# Patient Record
Sex: Male | Born: 1950
Health system: Southern US, Community
[De-identification: ages and names within clinical notes are randomized; demographics above are authoritative.]

## PROBLEM LIST (undated history)

## (undated) DIAGNOSIS — Z0282 Encounter for adoption services: Secondary | ICD-10-CM

## (undated) DIAGNOSIS — K635 Polyp of colon: Secondary | ICD-10-CM

## (undated) DIAGNOSIS — Z789 Other specified health status: Secondary | ICD-10-CM

## (undated) DIAGNOSIS — K625 Hemorrhage of anus and rectum: Secondary | ICD-10-CM

## (undated) DIAGNOSIS — C801 Malignant (primary) neoplasm, unspecified: Secondary | ICD-10-CM

## (undated) DIAGNOSIS — K859 Acute pancreatitis without necrosis or infection, unspecified: Secondary | ICD-10-CM

## (undated) HISTORY — DX: Polyp of colon: K63.5

## (undated) HISTORY — PX: CHOLECYSTECTOMY: SHX55

## (undated) HISTORY — DX: Hemorrhage of anus and rectum: K62.5

## (undated) HISTORY — PX: TONSILLECTOMY: SUR1361

## (undated) HISTORY — PX: POLYPECTOMY: SHX149

## (undated) HISTORY — DX: Acute pancreatitis without necrosis or infection, unspecified: K85.90

---

## 2014-02-24 ENCOUNTER — Emergency Department: Payer: Self-pay | Admitting: Internal Medicine

## 2014-02-24 DIAGNOSIS — K625 Hemorrhage of anus and rectum: Secondary | ICD-10-CM

## 2014-02-24 HISTORY — DX: Hemorrhage of anus and rectum: K62.5

## 2014-02-24 LAB — URINALYSIS, COMPLETE
Bilirubin,UR: NEGATIVE
Blood: NEGATIVE
Glucose,UR: NEGATIVE mg/dL (ref 0–75)
Ketone: NEGATIVE
Nitrite: NEGATIVE
Ph: 5 (ref 4.5–8.0)
Protein: NEGATIVE
Specific Gravity: 1.018 (ref 1.003–1.030)
Squamous Epithelial: NONE SEEN

## 2014-02-24 LAB — COMPREHENSIVE METABOLIC PANEL
AST: 30 U/L (ref 15–37)
Albumin: 3.7 g/dL (ref 3.4–5.0)
Alkaline Phosphatase: 98 U/L
Anion Gap: 7 (ref 7–16)
BUN: 21 mg/dL — ABNORMAL HIGH (ref 7–18)
Bilirubin,Total: 0.5 mg/dL (ref 0.2–1.0)
CALCIUM: 8.6 mg/dL (ref 8.5–10.1)
Chloride: 108 mmol/L — ABNORMAL HIGH (ref 98–107)
Co2: 25 mmol/L (ref 21–32)
Creatinine: 0.89 mg/dL (ref 0.60–1.30)
EGFR (African American): >60
EGFR (Non-African Amer.): >60
Glucose: 106 mg/dL — ABNORMAL HIGH (ref 65–99)
OSMOLALITY: 283 (ref 275–301)
Potassium: 3.9 mmol/L (ref 3.5–5.1)
SGPT (ALT): 39 U/L (ref 12–78)
Sodium: 140 mmol/L (ref 136–145)
TOTAL PROTEIN: 7.2 g/dL (ref 6.4–8.2)

## 2014-02-24 LAB — PROTIME-INR
INR: 1
Prothrombin Time: 12.9 secs (ref 11.5–14.7)

## 2014-02-24 LAB — CBC
HCT: 46.3 % (ref 40.0–52.0)
HGB: 15.9 g/dL (ref 13.0–18.0)
MCH: 31.5 pg (ref 26.0–34.0)
MCHC: 34.4 g/dL (ref 32.0–36.0)
MCV: 92 fL (ref 80–100)
PLATELETS: 172 10*3/uL (ref 150–440)
RBC: 5.05 10*6/uL (ref 4.40–5.90)
RDW: 14.1 % (ref 11.5–14.5)
WBC: 8.8 10*3/uL (ref 3.8–10.6)

## 2014-02-24 LAB — TROPONIN I: Troponin-I: <0.02 ng/mL

## 2014-02-28 ENCOUNTER — Ambulatory Visit: Payer: Self-pay | Admitting: Gastroenterology

## 2014-02-28 HISTORY — PX: COLONOSCOPY: SHX174

## 2014-02-28 LAB — CBC WITH DIFFERENTIAL/PLATELET
Basophil #: 0.1 10*3/uL (ref 0.0–0.1)
Basophil %: 0.4 %
Eosinophil #: 0.2 10*3/uL (ref 0.0–0.7)
Eosinophil %: 1.4 %
HCT: 33.4 % — ABNORMAL LOW (ref 40.0–52.0)
HGB: 11.3 g/dL — ABNORMAL LOW (ref 13.0–18.0)
LYMPHS PCT: 13 %
Lymphocyte #: 1.7 10*3/uL (ref 1.0–3.6)
MCH: 30.9 pg (ref 26.0–34.0)
MCHC: 33.8 g/dL (ref 32.0–36.0)
MCV: 92 fL (ref 80–100)
MONOS PCT: 6.4 %
Monocyte #: 0.8 x10 3/mm (ref 0.2–1.0)
NEUTROS ABS: 10.4 10*3/uL — AB (ref 1.4–6.5)
NEUTROS PCT: 78.8 %
Platelet: 227 10*3/uL (ref 150–440)
RBC: 3.66 10*6/uL — ABNORMAL LOW (ref 4.40–5.90)
RDW: 14.4 % (ref 11.5–14.5)
WBC: 13.1 10*3/uL — ABNORMAL HIGH (ref 3.8–10.6)

## 2014-03-01 LAB — CBC WITH DIFFERENTIAL/PLATELET
BASOS ABS: 0.1 10*3/uL (ref 0.0–0.1)
Basophil %: 0.7 %
EOS PCT: 1.6 %
Eosinophil #: 0.2 10*3/uL (ref 0.0–0.7)
HCT: 27.9 % — ABNORMAL LOW (ref 40.0–52.0)
HGB: 9.6 g/dL — ABNORMAL LOW (ref 13.0–18.0)
Lymphocyte #: 1.9 10*3/uL (ref 1.0–3.6)
Lymphocyte %: 18.1 %
MCH: 31.1 pg (ref 26.0–34.0)
MCHC: 34.3 g/dL (ref 32.0–36.0)
MCV: 91 fL (ref 80–100)
Monocyte #: 0.6 x10 3/mm (ref 0.2–1.0)
Monocyte %: 5.6 %
NEUTROS PCT: 74 %
Neutrophil #: 7.6 10*3/uL — ABNORMAL HIGH (ref 1.4–6.5)
PLATELETS: 184 10*3/uL (ref 150–440)
RBC: 3.09 10*6/uL — AB (ref 4.40–5.90)
RDW: 14.4 % (ref 11.5–14.5)
WBC: 10.3 10*3/uL (ref 3.8–10.6)

## 2014-03-01 LAB — PATHOLOGY REPORT

## 2014-03-01 LAB — HEMOGLOBIN: HGB: 9.3 g/dL — AB (ref 13.0–18.0)

## 2014-03-02 LAB — PATHOLOGY REPORT

## 2014-03-28 ENCOUNTER — Telehealth: Payer: Self-pay | Admitting: *Deleted

## 2014-03-28 NOTE — Telephone Encounter (Signed)
In preparation for sigmoid exam (rigid) on 04-02-14, he will need to have a fleets enema 2 hours prior to his office visit at 8:30.

## 2014-03-29 ENCOUNTER — Telehealth: Payer: Self-pay | Admitting: *Deleted

## 2014-03-29 NOTE — Telephone Encounter (Signed)
Pt aware to do fleet enema 2 hours prior to ov at 8:30 on 04/02/14

## 2014-04-02 ENCOUNTER — Encounter: Payer: Self-pay | Admitting: General Surgery

## 2014-04-02 ENCOUNTER — Ambulatory Visit (INDEPENDENT_AMBULATORY_CARE_PROVIDER_SITE_OTHER): Payer: 59 | Admitting: General Surgery

## 2014-04-02 VITALS — BP 140/72 | HR 82 | Resp 14 | Ht 72.0 in | Wt 303.0 lb

## 2014-04-02 DIAGNOSIS — K625 Hemorrhage of anus and rectum: Secondary | ICD-10-CM

## 2014-04-02 DIAGNOSIS — K620 Anal polyp: Secondary | ICD-10-CM

## 2014-04-02 DIAGNOSIS — K621 Rectal polyp: Secondary | ICD-10-CM

## 2014-04-02 LAB — HEMOCCULT GUIAC POC 1CARD (OFFICE): FECAL OCCULT BLD: NEGATIVE

## 2014-04-02 NOTE — Patient Instructions (Signed)
Patient to be referred to [facility]. The patient is aware to call back for any questions or concerns.

## 2014-04-02 NOTE — Progress Notes (Signed)
Patient ID: Eric [name], male   DOB: 28-Mar-1951, 63 y.o.   MRN: [id number]  Chief Complaint  Patient presents with  . Other    Rigid sigmoidoscopy    HPI Eric [name] is a 63 y.o. male who presents for rectal bleeding and rigid sigmoidoscopy procedure. The bleeding was noticed in April 26,2015. He states he is not having any current bleeding. He had a colonoscopy done on 02/28/14. No other complaints at this time. The patient has a history of hemorrhoids. Regular bowel movements. He has had some rectal bleeding prior to this episode. The bleeding has been dark brown in the past.  The patient is not one to seek healthcare without strong reason. His rectal bleeding caused presentation to the ED and subsequent referral to the GI department.  The patient had been seen here in the distant past at which time he had undergone a cholecystectomy.  The patient underwent a colonoscopy on February 28, 2014. At that time a polyp was identified in the rectum. Attempted snare cautery was unsuccessful and the snare subsequently became lodged on the polyp. He had significant bleeding and was taken to the operating room the same day by Marlyce Huge, M.D. At which time the snares were removed. Subsequent pathology showed high-grade dysplasia. The patient is seen today to consider options for management. He been asked to prepare himself with the use of a fleets enema prior to the procedure so the area of the polyp could be directly assessed.   HPI  Past Medical History  Diagnosis Date  . Rectal bleeding 02/24/14  . Colon polyps   . Pancreatitis     Past Surgical History  Procedure Laterality Date  . Colonoscopy  02/28/14    Family History  Problem Relation Age of Onset  . Adopted: Yes    Social History History  Substance Use Topics  . Smoking status: Current Every Day Smoker -- 1.00 packs/day for 15 years    Types: Cigars  . Smokeless tobacco: Not on file  . Alcohol Use: Yes   Comment: 6-7 drinks weekly    No Known Allergies  No current outpatient prescriptions on file.   No current facility-administered medications for this visit.    Review of Systems Review of Systems  Constitutional: Negative.   Respiratory: Negative.   Cardiovascular: Negative.   Gastrointestinal: Positive for anal bleeding.    Blood pressure 140/72, pulse 82, resp. rate 14, height 6' (1.829 m), weight 303 lb (137.44 kg).  Physical Exam Physical Exam  Constitutional: He is oriented to person, place, and time. He appears well-developed and well-nourished.  Cardiovascular: Normal rate, regular rhythm and normal heart sounds.   No murmur heard. Pulmonary/Chest: Effort normal and breath sounds normal.  Abdominal: Soft. Normal appearance and bowel sounds are normal. There is no hepatosplenomegaly. There is no tenderness. No hernia.  Genitourinary:  Anterior skin tag.  Neurological: He is alert and oriented to person, place, and time.  Skin: Skin is warm and dry.    Data Reviewed Pathology from the February 28, 2014 colonoscopy was reviewed. Distal sigmoid polyp showed a hyperplastic polyp. The rectum showed a tubulovillous adenoma with high-grade dysplasia in multiple fragments in aggregate measuring 0.4 x 2.6 x 3.1 cm, the largest fragment measuring 1 cm in diameter. At the time of snare removal on additional 1.5 x 3.0 x 4.6 cm fragments of polypoid tissue were removed showing high-grade dysplasia.  CT scan dated February 24, 2014 showed circumferential wall thickening involving the  rectum without perirectal stranding. Extensive colonic diverticulosis. Suspected hepatic steatosis. 1.3 cm right dome of the liver lesion Dr. Are present no meningeal or complex cyst.  Laboratory studies dated February 24, 2014 showed a hemoglobin of 15.9 with an MCV of 92 and platelet count 172,000. CBC dated Mar 01, 2014 showed a hemoglobin of 9.6. The lobe was hemoglobin reported was on Mar 01, 2014 at 9.3. ECG  showed normal sinus rhythm, nonspecific T-wave abnormality. No previous ECGs available.  Sigmoidoscopy showed a firm nodular mass on the anterior rectal wall proximally 7 cm. No bleeding was noted. Stool is Hemoccult negative. The remaining rectum to 12 cm was unremarkable. No circumferential lesion was appreciated.  Assessment    Tubulovillous adenoma the anterior rectum with high-grade dysplasia.     Plan    The base of the polyp appears to remain, and intact excision to assess for the possibility of high-grade dysplasia or malignancy would be most appropriate. Considering the one a half centimeter diameter base and the firmness evident, I think he would be best served by a microsurgical technique rather than trying to bring this area down with a less than complete excision under direct vision alone. This would be best completed at a university setting by a trained colorectal specialist.  The patient's wife was a little disappointed that this could not be taken care of directly encountered, but are not were then able to surgical colleagues are doing microsurgical excision. Options for referral to [facility] or [facility] were discussed.   We'll make arrangements for university assessment the next available date.    PCP: No Pcp Per Patient Ref. MD: Tonia Ghent Melecio Cueto 04/02/2014, 8:55 PM

## 2014-05-14 ENCOUNTER — Telehealth: Payer: Self-pay | Admitting: *Deleted

## 2014-05-14 NOTE — Telephone Encounter (Signed)
Message copied by Carson Myrtle on Tue May 14, 2014  9:25 AM ------      Message from: Robert Bellow      Created: Fri May 10, 2014  2:41 PM       See how evaluation with Dr. Audie Clear at [facility] is going. Thanks.  ------

## 2014-05-14 NOTE — Telephone Encounter (Signed)
I talked with his wife and she said they removed the polyp yesterday 05-13-14, the surgery itself went well, however, he had trouble voiding postop. In/out cath x 1 and he is being discharged today. Appreciates phone call.

## 2014-08-05 LAB — PSA: PSA: 0.7

## 2015-02-22 NOTE — Op Note (Signed)
PATIENT NAME:  Eric [name], Eric [name] MR#:  [id number] DATE OF BIRTH:  05-05-1951  DATE OF PROCEDURE:  02/28/2014  ATTENDING PHYSICIAN: Harrell Gave A. Ijanae Macapagal, MD  PREOPERATIVE DIAGNOSES:  1. Rectal polyp. 2. Retained snares from rectal polyp.   POSTOPERATIVE DIAGNOSES:  1. Rectal polyp. 2. Retained snares from rectal polyp.   PROCEDURES PERFORMED:  1. Rectal exam under anesthesia.  2. Removal of rectal foreign body x2. 3. Biopsy of rectal polyp.   ANESTHESIA: General.   ESTIMATED BLOOD LOSS: 50 mL.   COMPLICATIONS: None.   SPECIMEN: Foreign bodies x2 for gross and followup biopsy.   INDICATIONS FOR PROCEDURE: [name] is a pleasant 64 year old male who presents with rectal bleeding, who underwent a colonoscopy. He was noted to have a large friable polyp approximately 7 cm from his anus. He had attempts to snare it, resulting in inability to remove polyp and retention of snare. I was thus consulted for removal of snare.   DETAILS OF PROCEDURE: As follows: Informed consent was obtained. [name] was brought to the operating room suite. He was induced, endotracheal tube was placed, general anesthesia was administered. He was placed in stirrups. His anus was prepped and draped in standard surgical fashion. A timeout was then performed, correctly identifying the patient name, operative site and procedure to be performed. The wires were evaluated. They were slightly tensioned. Tension was put on it, and they were fixed. I then placed a rigid sigmoidoscope, and unfortunately, I could not obtain insufflation to the point of the polyp, which could be easily palpated. I then obtained an anoscope, which the polyp was a little too proximal. It felt to be approximately 7 cm. I then examined the loops of the snare and noticed that they were loops and cut one strand and then pulled both snares off. I then went to examine the polyp to make sure it was hemostatic. Attempts to grab it with Allis clamp ended up  removing pieces of the friable polyp. After cautery was used to obtain hemostasis, Gelfoam was then placed into the anus at the area of the polyp for hemostasis. There appeared to be no sufficient bleeding and no retained blood in the rectum. The patient's drapes were then taken down. He was then awoken, extubated and brought to the postanesthesia care unit. There were no immediate complications. Needle, sponge and instrument counts were correct at the end of the procedure.   ____________________________ Glena Norfolk. Ayanah Snader, MD cal:lb D: 03/01/2014 10:29:33 ET T: 03/01/2014 10:57:45 ET JOB#: [id number]  cc: Harrell Gave A. Aleksis Jiggetts, MD, <Dictator> Floyde Parkins MD ELECTRONICALLY SIGNED 03/02/2014 8:36

## 2015-02-22 NOTE — H&P (Signed)
   Subjective/Chief Complaint Rectal polyp, retained snare wire x 2 following attempted polypectomy   History of Present Illness Eric [name] is a pleasant   Past History Pancreatitis H/o excision of external hemorrhoid   Past Med/Surgical Hx:  Pancreatitis:   External Hemorrhoid removal:   ALLERGIES:  No Known Allergies:   Family and Social History:  Family History Non-Contributory   Social History negative tobacco, negative ETOH   Place of Living Home   Review of Systems:  Subjective/Chief Complaint Rectal foreign body - snare x 2   Fever/Chills No   Cough No   Sputum No   Abdominal Pain No   Diarrhea No   Constipation No   Nausea/Vomiting No   SOB/DOE No   Chest Pain No   Dysuria No   Tolerating PT No   Tolerating Diet Yes   Physical Exam:  GEN well developed, no acute distress   HEENT pink conjunctivae, PERRL   NECK No masses   RESP normal resp effort  clear BS  no use of accessory muscles   CARD regular rate  no murmur  no thrills  no carotid bruits  No LE edema  no JVD   ABD denies tenderness  denies Flank Tenderness  no hernia  soft  normal BS  Rectal exam deferred   EXTR negative cyanosis/clubbing, negative edema   SKIN normal to palpation, No rashes, No ulcers   NEURO cranial nerves intact, negative rigidity, negative tremor   PSYCH A+O to time, place, person, good insight    Assessment/Admission Diagnosis Eric [name] is a pleasant 64 yo M with retained snares following attempted excision of rectal mass.  Per Dr. Candace Cruise, rectal mass was oozing at time of scope removal   Plan To OR for EUA, probable rigid sigmoidoscopy and removal of snare and hemostasis   Electronic Signatures: Judit Awad, Glena Norfolk (MD)  (Signed 30-Apr-15 10:53)  Authored: CHIEF COMPLAINT and HISTORY, PAST MEDICAL/SURGIAL HISTORY, ALLERGIES, FAMILY AND SOCIAL HISTORY, REVIEW OF SYSTEMS, PHYSICAL EXAM, ASSESSMENT AND PLAN   Last Updated: 30-Apr-15 10:53 by Floyde Parkins (MD)

## 2015-02-22 NOTE — Consult Note (Signed)
Signif drop in hgb overnight. Fortunately, no bleeding since 11 PM last night. No abd pain. Minimal change in hgb since this AM. Stable for discharge. Will contact patient once bx results are back from rectum. Thanks for surgery help.   Electronic Signatures: Verdie Shire (MD)  (Signed on 01-May-15 20:59)  Authored  Last Updated: 01-May-15 20:59 by Verdie Shire (MD)

## 2016-03-25 DIAGNOSIS — Z86010 Personal history of colonic polyps: Secondary | ICD-10-CM | POA: Insufficient documentation

## 2016-03-25 DIAGNOSIS — K5790 Diverticulosis of intestine, part unspecified, without perforation or abscess without bleeding: Secondary | ICD-10-CM | POA: Insufficient documentation

## 2016-03-25 DIAGNOSIS — F17200 Nicotine dependence, unspecified, uncomplicated: Secondary | ICD-10-CM | POA: Insufficient documentation

## 2018-02-16 ENCOUNTER — Encounter: Payer: Self-pay | Admitting: Family Medicine

## 2018-02-16 ENCOUNTER — Ambulatory Visit: Payer: BLUE CROSS/BLUE SHIELD | Admitting: Family Medicine

## 2018-02-16 VITALS — BP 120/70 | HR 84 | Temp 99.8°F | Resp 16 | Wt 300.0 lb

## 2018-02-16 DIAGNOSIS — J301 Allergic rhinitis due to pollen: Secondary | ICD-10-CM

## 2018-02-16 DIAGNOSIS — Z86010 Personal history of colonic polyps: Secondary | ICD-10-CM

## 2018-02-16 DIAGNOSIS — K5790 Diverticulosis of intestine, part unspecified, without perforation or abscess without bleeding: Secondary | ICD-10-CM | POA: Diagnosis not present

## 2018-02-16 DIAGNOSIS — J019 Acute sinusitis, unspecified: Secondary | ICD-10-CM | POA: Diagnosis not present

## 2018-02-16 MED ORDER — AMOXICILLIN 500 MG PO CAPS: 1000.0000 mg | ORAL_CAPSULE | Freq: Two times a day (BID) | ORAL | 0 refills | Status: AC

## 2018-02-16 NOTE — Progress Notes (Signed)
Patient: Eric [name] Male    DOB: [birth year]   67 y.o.   MRN: [id number] Visit Date: 02/16/2018  Today's Provider: Lelon Huh, MD   Chief Complaint  Patient presents with  . Cough   Subjective:    Patient has had sinus congestion and cough for 2 1/2 weeks. Patient also has symptoms of wheezing, ear congestion, sore throat, shortness of breath, and eye irritation. Patient's eyes are red, swollen and have discharge. Patient has been taking otc dayquil and mucinex with mild relief.   Cough  This is a new problem. The current episode started 1 to 4 weeks ago (2 1/2 weeks). The problem has been gradually worsening. The cough is productive of sputum. Associated symptoms include ear congestion, a fever, nasal congestion, rhinorrhea, a sore throat, shortness of breath and wheezing. Pertinent negatives include no chest pain, chills, ear pain, headaches, heartburn, hemoptysis, myalgias, postnasal drip, rash, sweats or weight loss. The symptoms are aggravated by lying down. Treatments tried: dayquil and muninex. The treatment provided mild relief. There is no history of asthma, bronchiectasis, bronchitis, COPD, emphysema, environmental allergies or pneumonia.     Patient presents to re-establish care. Was last seen in this office in October 2015. Reviewed complete medical family and social history today. He is now retired from [employer] for about 2 years.   No Known Allergies   Current Outpatient Medications:  Marland Kitchen  MULTIPLE VITAMIN PO, Take by mouth., Disp: , Rfl:   Past Medical History:  Diagnosis Date  . Colon polyps   . Pancreatitis   . Rectal bleeding 02/24/14   Past Surgical History:  Procedure Laterality Date  . CHOLECYSTECTOMY    . COLONOSCOPY  02/28/14  . POLYPECTOMY     colon polyp removed  . TONSILLECTOMY     Family History  Adopted: Yes     Review of Systems  Constitutional: Positive for fever. Negative for chills and weight loss.  HENT: Positive for  congestion, rhinorrhea, sinus pressure and sore throat. Negative for ear pain and postnasal drip.   Respiratory: Positive for cough, shortness of breath and wheezing. Negative for hemoptysis.   Cardiovascular: Negative for chest pain.  Gastrointestinal: Negative for heartburn.  Musculoskeletal: Negative for myalgias.  Skin: Negative for rash.  Allergic/Immunologic: Negative for environmental allergies.  Neurological: Negative for headaches.    Social History   Tobacco Use  . Smoking status: Current Every Day Smoker    Packs/day: 1.00    Years: 15.00    Pack years: 15.00    Types: Cigars  . Smokeless tobacco: Never Used  Substance Use Topics  . Alcohol use: Yes    Comment: 6-7 drinks weekly   Objective:   BP 120/70 (BP Location: Right Arm, Patient Position: Sitting, Cuff Size: Large)   Pulse 84   Temp 99.8 F (37.7 C) (Oral)   Resp 16   Wt 300 lb (136.1 kg)   SpO2 95%   BMI 41.84 kg/m  Vitals:   02/16/18 1118  BP: 120/70  Pulse: 84  Resp: 16  Temp: 99.8 F (37.7 C)  TempSrc: Oral  SpO2: 95%  Weight: 300 lb (136.1 kg)     Physical Exam  General Appearance:    Alert, cooperative, no distress  HENT:   bilateral TM normal without fluid or infection, neck has bilateral anterior cervical nodes enlarged, frontal sinus tender and nasal mucosa pale and congested  Eyes:    PERRL, conjunctiva/corneas clear, EOM's intact  Lungs:     Clear to auscultation bilaterally, respirations unlabored  Heart:    Regular rate and rhythm  Neurologic:   Awake, alert, oriented x 3. No apparent focal neurological           defect.           Assessment & Plan:      Re-establish patient to practice.   1. Subacute sinusitis, unspecified location  - amoxicillin (AMOXIL) 500 MG capsule; Take 2 capsules (1,000 mg total) by mouth 2 (two) times daily for 7 days.  Dispense: 28 capsule; Refill: 0  2. Allergic rhinitis due to pollen, unspecified seasonality Start OTC non-sedating  antihistamine.         Lelon Huh, MD  [facility]

## 2018-02-16 NOTE — Patient Instructions (Signed)
   Start taking a daily non-sedating antihistamine such as fexofenadine (Allegra) or cetirizine (Zyrtec)

## 2018-02-17 ENCOUNTER — Ambulatory Visit: Payer: Self-pay | Admitting: Family Medicine

## 2018-06-05 ENCOUNTER — Encounter: Payer: Self-pay | Admitting: Family Medicine

## 2019-06-18 ENCOUNTER — Encounter: Payer: Self-pay | Admitting: Physician Assistant

## 2019-06-18 ENCOUNTER — Other Ambulatory Visit: Payer: Self-pay

## 2019-06-18 ENCOUNTER — Ambulatory Visit (INDEPENDENT_AMBULATORY_CARE_PROVIDER_SITE_OTHER): Payer: Self-pay | Admitting: Physician Assistant

## 2019-06-18 DIAGNOSIS — J0510 Acute epiglottitis without obstruction: Secondary | ICD-10-CM

## 2019-06-18 MED ORDER — AZITHROMYCIN 250 MG PO TABS: ORAL_TABLET | ORAL | 0 refills | Status: DC

## 2019-06-18 MED ORDER — PREDNISONE 10 MG (21) PO TBPK: ORAL_TABLET | ORAL | 0 refills | Status: DC

## 2019-06-18 MED ORDER — GUAIFENESIN 100 MG/5ML PO LIQD: 200.0000 mg | Freq: Three times a day (TID) | ORAL | 0 refills | Status: DC | PRN

## 2019-06-18 NOTE — Progress Notes (Signed)
Patient: Eric [name] Male    DOB: [birth year]   68 y.o.   MRN: [id number] Visit Date: 06/18/2019  Today's Provider: Mar Daring, PA-C   No chief complaint on file.  Subjective:     Virtual Visit via Video Note  I connected with Gorden Harms on 06/18/19 at  3:40 PM EDT by a video enabled telemedicine application and verified that I am speaking with the correct person using two identifiers.  Location: Patient: Home Provider: [facility]   I discussed the limitations of evaluation and management by telemedicine and the availability of in person appointments. The patient expressed understanding and agreed to proceed.  HPI:  Eric [name] is a 68 yr old male that presents via video visit for concerns of hoarseness, sensation of something in his throat, increased mucus production, and chest congestion/cough. He reports that 2-3 weeks ago he was taking his multivitamins and had one get stuck in his throat. He was choking and could not get it to go down, but eventually got it to come back up. Since then he has been hoarse talking, had some swelling in his neck (glands vs lymph nodes) with the left side worse than the right. Also just 3-4 days ago he noticed increased mucous production in his throat and increased chest congestion. He started taking Mucinex. He reports that since starting Mucinex the chest congestion and cough have all improved but he still has this fullness sensation in his throat and has the hoarse voice.   No Known Allergies  Current Outpatient Medications:  Marland Kitchen  MULTIPLE VITAMIN PO, Take by mouth., Disp: , Rfl:   Review of Systems  Constitutional: Negative.  Negative for chills and fever.  HENT: Positive for congestion, sore throat, trouble swallowing and voice change. Negative for ear pain, postnasal drip, rhinorrhea, sinus pressure and sinus pain.   Respiratory: Positive for cough and shortness of breath (did have a few nights ago while sleeping). Negative for  chest tightness and wheezing.   Cardiovascular: Negative for chest pain, palpitations and leg swelling.  Gastrointestinal: Negative for abdominal pain and nausea.  Neurological: Negative for dizziness and headaches.    Social History   Tobacco Use  . Smoking status: Current Every Day Smoker    Packs/day: 1.00    Years: 20.00    Pack years: 20.00    Types: Cigars  . Smokeless tobacco: Never Used  . Tobacco comment: smoke 1/2 ppd cigaretes since 68 yo. changed to cigars at age July 2004.   Substance Use Topics  . Alcohol use: Yes    Comment: 6-7 drinks weekly      Objective:   There were no vitals taken for this visit. There were no vitals filed for this visit.   Physical Exam Vitals signs reviewed.  Constitutional:      General: He is not in acute distress.    Appearance: Normal appearance. He is well-developed. He is not ill-appearing.  HENT:     Head: Normocephalic and atraumatic.  Eyes:     Conjunctiva/sclera: Conjunctivae normal.  Neck:     Musculoskeletal: Normal range of motion and neck supple.     Comments: Voice sounds almost like "hot potato" voice Pulmonary:     Effort: Pulmonary effort is normal. No respiratory distress.  Neurological:     Mental Status: He is alert.  Psychiatric:        Behavior: Behavior normal.        Thought Content: Thought  content normal.        Judgment: Judgment normal.      No results found for any visits on 06/18/19.     Assessment & Plan     1. Epiglottitis Possible epiglottitis or other swelling in the laryngopharynx region from the multivitamin getting stuck possibly causing a tear or injury that may have set up for secondary infection. Will treat with zpak, prednisone as below. Guaifenesin prescribed for mucus production as below since this is helping currently. Advised to call if symptoms change or worsen in the next coming days as he may require ENT referral to evaluate his throat better. He agrees.  - azithromycin  (ZITHROMAX) 250 MG tablet; Take 2 tablets PO on day one, and one tablet PO daily thereafter until completed.  Dispense: 6 tablet; Refill: 0 - predniSONE (STERAPRED UNI-PAK 21 TAB) 10 MG (21) TBPK tablet; 6 day taper; take as directed on package instructions  Dispense: 21 tablet; Refill: 0 - guaiFENesin (ROBITUSSIN) 100 MG/5ML liquid; Take 10 mLs (200 mg total) by mouth 3 (three) times daily as needed for cough.  Dispense: 120 mL; Refill: 0   I discussed the assessment and treatment plan with the patient. The patient was provided an opportunity to ask questions and all were answered. The patient agreed with the plan and demonstrated an understanding of the instructions.   The patient was advised to call back or seek an in-person evaluation if the symptoms worsen or if the condition fails to improve as anticipated.  I provided 14 minutes of non-face-to-face time during this encounter.    Mar Daring, PA-C  [facility]

## 2019-06-25 ENCOUNTER — Telehealth: Payer: Self-pay | Admitting: General Practice

## 2019-06-25 DIAGNOSIS — J0510 Acute epiglottitis without obstruction: Secondary | ICD-10-CM

## 2019-06-25 MED ORDER — PREDNISONE 10 MG (21) PO TBPK: ORAL_TABLET | ORAL | 0 refills | Status: DC

## 2019-06-25 MED ORDER — AZITHROMYCIN 250 MG PO TABS: ORAL_TABLET | ORAL | 0 refills | Status: DC

## 2019-06-25 NOTE — Telephone Encounter (Signed)
Patient was advised.  

## 2019-06-25 NOTE — Telephone Encounter (Signed)
Did zpak help any? Does he want to try a second round of zpak?

## 2019-06-25 NOTE — Telephone Encounter (Signed)
Please advise 

## 2019-06-25 NOTE — Telephone Encounter (Signed)
FYI! Left detailed message notifying pharmacy it is ok to get second zpak.

## 2019-06-25 NOTE — Telephone Encounter (Signed)
[facility] 

## 2019-06-25 NOTE — Telephone Encounter (Signed)
[facility] called asking if it is ok that patient is getting two rounds of zpak back to back  CB#  [phone]  [place]

## 2019-06-25 NOTE — Telephone Encounter (Signed)
Pt calling back to let Tawanna Sat know the prednisone helped but still having the cold symptoms.  Pt wants to know what to do next.  Please advise.  Thanks, [employer]

## 2019-06-25 NOTE — Telephone Encounter (Signed)
Patient stated the combination of zpak and prednisone seemed to help somewhat but throat is still sore. Patient states if you think another round of zpak will help, he will try it.

## 2019-06-27 ENCOUNTER — Other Ambulatory Visit: Payer: Self-pay | Admitting: Otolaryngology

## 2019-06-27 ENCOUNTER — Telehealth: Payer: Self-pay | Admitting: General Practice

## 2019-06-27 DIAGNOSIS — R599 Enlarged lymph nodes, unspecified: Secondary | ICD-10-CM

## 2019-06-27 DIAGNOSIS — D3702 Neoplasm of uncertain behavior of tongue: Secondary | ICD-10-CM

## 2019-06-27 DIAGNOSIS — R591 Generalized enlarged lymph nodes: Secondary | ICD-10-CM | POA: Diagnosis not present

## 2019-06-27 DIAGNOSIS — R1314 Dysphagia, pharyngoesophageal phase: Secondary | ICD-10-CM | POA: Diagnosis not present

## 2019-06-27 DIAGNOSIS — H6121 Impacted cerumen, right ear: Secondary | ICD-10-CM | POA: Diagnosis not present

## 2019-06-27 NOTE — Telephone Encounter (Signed)
Opened in error

## 2019-06-29 ENCOUNTER — Other Ambulatory Visit: Payer: Self-pay

## 2019-06-29 ENCOUNTER — Ambulatory Visit
Admission: RE | Admit: 2019-06-29 | Discharge: 2019-06-29 | Disposition: A | Discharge status: Home / Self Care | Payer: Medicare Other | Source: Ambulatory Visit | Attending: Otolaryngology | Admitting: Otolaryngology

## 2019-06-29 DIAGNOSIS — R599 Enlarged lymph nodes, unspecified: Secondary | ICD-10-CM | POA: Diagnosis not present

## 2019-06-29 DIAGNOSIS — D3702 Neoplasm of uncertain behavior of tongue: Secondary | ICD-10-CM | POA: Insufficient documentation

## 2019-06-29 DIAGNOSIS — R1310 Dysphagia, unspecified: Secondary | ICD-10-CM | POA: Diagnosis not present

## 2019-06-29 LAB — POCT I-STAT CREATININE: Creatinine, Ser: 1 mg/dL (ref 0.61–1.24)

## 2019-06-29 MED ORDER — IOHEXOL 300 MG/ML  SOLN
75.0000 mL | Freq: Once | INTRAMUSCULAR | Status: AC | PRN
  Administered 2019-06-29: 10:00:00 75 mL via INTRAVENOUS

## 2019-07-02 ENCOUNTER — Telehealth: Payer: Self-pay | Admitting: Internal Medicine

## 2019-07-02 DIAGNOSIS — R591 Generalized enlarged lymph nodes: Secondary | ICD-10-CM | POA: Diagnosis not present

## 2019-07-02 DIAGNOSIS — J019 Acute sinusitis, unspecified: Secondary | ICD-10-CM | POA: Diagnosis not present

## 2019-07-02 NOTE — Telephone Encounter (Signed)
Spoke to [place] re: new referral. Dx; Lymphoma. Dr.Vaught going to contact the pt re: referral in AM today.   Please have pt follow up with me on 9/01 at 2:15. Please contact the pt re: appt LATER in the afternoon today.

## 2019-07-03 ENCOUNTER — Other Ambulatory Visit: Payer: Self-pay

## 2019-07-03 ENCOUNTER — Encounter: Payer: Self-pay | Admitting: Internal Medicine

## 2019-07-03 ENCOUNTER — Inpatient Hospital Stay: Payer: Medicare Other

## 2019-07-03 ENCOUNTER — Other Ambulatory Visit: Payer: Self-pay | Admitting: Otolaryngology

## 2019-07-03 ENCOUNTER — Inpatient Hospital Stay: Payer: Medicare Other | Attending: Internal Medicine | Admitting: Internal Medicine

## 2019-07-03 VITALS — BP 103/67 | HR 104 | Temp 97.1°F | Resp 24

## 2019-07-03 DIAGNOSIS — D3702 Neoplasm of uncertain behavior of tongue: Secondary | ICD-10-CM

## 2019-07-03 DIAGNOSIS — C8338 Diffuse large B-cell lymphoma, lymph nodes of multiple sites: Secondary | ICD-10-CM | POA: Diagnosis not present

## 2019-07-03 DIAGNOSIS — R590 Localized enlarged lymph nodes: Secondary | ICD-10-CM | POA: Diagnosis not present

## 2019-07-03 DIAGNOSIS — Z5111 Encounter for antineoplastic chemotherapy: Secondary | ICD-10-CM | POA: Diagnosis not present

## 2019-07-03 DIAGNOSIS — R591 Generalized enlarged lymph nodes: Secondary | ICD-10-CM

## 2019-07-03 DIAGNOSIS — C8590 Non-Hodgkin lymphoma, unspecified, unspecified site: Secondary | ICD-10-CM | POA: Insufficient documentation

## 2019-07-03 DIAGNOSIS — Z5112 Encounter for antineoplastic immunotherapy: Secondary | ICD-10-CM | POA: Insufficient documentation

## 2019-07-03 DIAGNOSIS — Z5189 Encounter for other specified aftercare: Secondary | ICD-10-CM | POA: Insufficient documentation

## 2019-07-03 LAB — CBC WITH DIFFERENTIAL/PLATELET
Abs Immature Granulocytes: 0.05 10*3/uL (ref 0.00–0.07)
Basophils Absolute: 0 10*3/uL (ref 0.0–0.1)
Basophils Relative: 0 %
Eosinophils Absolute: 0 10*3/uL (ref 0.0–0.5)
Eosinophils Relative: 0 %
HCT: 28.7 % — ABNORMAL LOW (ref 39.0–52.0)
Hemoglobin: 9.1 g/dL — ABNORMAL LOW (ref 13.0–17.0)
Immature Granulocytes: 1 %
Lymphocytes Relative: 21 %
Lymphs Abs: 1.1 10*3/uL (ref 0.7–4.0)
MCH: 30.8 pg (ref 26.0–34.0)
MCHC: 31.7 g/dL (ref 30.0–36.0)
MCV: 97.3 fL (ref 80.0–100.0)
Monocytes Absolute: 0.9 10*3/uL (ref 0.1–1.0)
Monocytes Relative: 17 %
Neutro Abs: 3.2 10*3/uL (ref 1.7–7.7)
Neutrophils Relative %: 61 %
Platelets: 139 10*3/uL — ABNORMAL LOW (ref 150–400)
RBC: 2.95 MIL/uL — ABNORMAL LOW (ref 4.22–5.81)
RDW: 17.8 % — ABNORMAL HIGH (ref 11.5–15.5)
WBC: 5.3 10*3/uL (ref 4.0–10.5)
nRBC: 0 % (ref 0.0–0.2)

## 2019-07-03 LAB — COMPREHENSIVE METABOLIC PANEL
ALT: 23 U/L (ref 0–44)
AST: 25 U/L (ref 15–41)
Albumin: 3.8 g/dL (ref 3.5–5.0)
Alkaline Phosphatase: 75 U/L (ref 38–126)
Anion gap: 5 (ref 5–15)
BUN: 28 mg/dL — ABNORMAL HIGH (ref 8–23)
CO2: 30 mmol/L (ref 22–32)
Calcium: 9.6 mg/dL (ref 8.9–10.3)
Chloride: 103 mmol/L (ref 98–111)
Creatinine, Ser: 1.22 mg/dL (ref 0.61–1.24)
GFR calc Af Amer: >60 mL/min (ref >60)
GFR calc non Af Amer: >60 mL/min (ref >60)
Glucose, Bld: 104 mg/dL — ABNORMAL HIGH (ref 70–99)
Potassium: 4.3 mmol/L (ref 3.5–5.1)
Sodium: 138 mmol/L (ref 135–145)
Total Bilirubin: 2.1 mg/dL — ABNORMAL HIGH (ref 0.3–1.2)
Total Protein: 6.9 g/dL (ref 6.5–8.1)

## 2019-07-03 LAB — LACTATE DEHYDROGENASE: LDH: 280 U/L — ABNORMAL HIGH (ref 98–192)

## 2019-07-03 MED ORDER — PREDNISONE 50 MG PO TABS: ORAL_TABLET | ORAL | 0 refills | Status: DC

## 2019-07-03 NOTE — Assessment & Plan Note (Deleted)
#   #   DISPOSITION: # labs today # PET ASAP # follow up in 1 week- DR.B

## 2019-07-03 NOTE — Patient Instructions (Signed)
#  Biopsy planned tomorrow await further instructions from the radiology department  #Start prednisone as ordered; after the biopsy is done.  #If any worsening difficulty breathing-go to the emergency room.

## 2019-07-03 NOTE — Progress Notes (Signed)
[place] [facility] NOTE  Patient Care Team: [name], MD as PCP - General (Family Medicine) [name] [name], MD (General Surgery)  CHIEF COMPLAINTS/PURPOSE OF CONSULTATION: Bulky neck adenopathy/lymphoma  #  Oncology History Overview Note  #June 29, 2019-bulky bilateral neck adenopathy left more than right;    Lymphoma of lymph nodes ([place])  07/03/2019 Initial Diagnosis   Lymphoma of lymph nodes ([place])      HISTORY OF PRESENTING ILLNESS:  Eric [name] 68 y.o.  male with no significant past medical history not on any regular medications-has been referred to [place] for bulky lymphadenopathy in the neck suggestive of lymphoma.  Patient states that approximately 5 weeks ago noted to have difficulties swallowing initially; when a vitamin pill got stuck.  However this started to be more frequent.   In the last 3- 4 weeks he noticed bilateral swelling in the neck left more than right.  He also complains of difficulty breathing especially at nighttime.  He thinks he has sleep apnea; although he has never been tested.  Patient is quite anxious.  Given the concerns of malignancy.   Lost about 12 pounds in the last 4 to 6 weeks given the difficulty in eating.  Poor appetite.  No night sweats.  No fevers.   Review of Systems  Constitutional: Positive for malaise/fatigue and weight loss. Negative for chills, diaphoresis and fever.  HENT: Negative for nosebleeds and sore throat.   Eyes: Negative for double vision.  Respiratory: Positive for cough and shortness of breath. Negative for hemoptysis, sputum production and wheezing.   Cardiovascular: Negative for chest pain, palpitations, orthopnea and leg swelling.  Gastrointestinal: Negative for abdominal pain, blood in stool, constipation, diarrhea, heartburn, melena, nausea and vomiting.  Genitourinary: Negative for dysuria, frequency and urgency.  Musculoskeletal: Negative for back pain and joint pain.  Skin:  Negative.  Negative for itching and rash.  Neurological: Negative for dizziness, tingling, focal weakness, weakness and headaches.  Endo/Heme/Allergies: Does not bruise/bleed easily.  Psychiatric/Behavioral: Negative for depression. The patient is nervous/anxious. The patient does not have insomnia.      MEDICAL HISTORY:  Past Medical History:  Diagnosis Date  . Colon polyps   . Pancreatitis   . Rectal bleeding 02/24/14    SURGICAL HISTORY: Past Surgical History:  Procedure Laterality Date  . CHOLECYSTECTOMY    . COLONOSCOPY  02/28/14  . POLYPECTOMY     colon polyp removed  . TONSILLECTOMY      SOCIAL HISTORY: Social History   Socioeconomic History  . Marital status: Married    Spouse name: Not on file  . Number of children: Not on file  . Years of education: Not on file  . Highest education level: Not on file  Occupational History    Comment: Retired from [employer]  . Financial resource strain: Not on file  . Food insecurity    Worry: Not on file    Inability: Not on file  . Transportation needs    Medical: Not on file    Non-medical: Not on file  Tobacco Use  . Smoking status: Current Every Day Smoker    Packs/day: 1.00    Years: 20.00    Pack years: 20.00    Types: Cigars  . Smokeless tobacco: Never Used  . Tobacco comment: smoke 1/2 ppd cigaretes since 68 yo. changed to cigars at age July 2004.   Substance and Sexual Activity  . Alcohol use: Yes    Comment: 6-7  drinks weekly  . Drug use: No  . Sexual activity: Not on file  Lifestyle  . Physical activity    Days per week: Not on file    Minutes per session: Not on file  . Stress: Not on file  Relationships  . Social Herbalist on phone: Not on file    Gets together: Not on file    Attends religious service: Not on file    Active member of club or organization: Not on file    Attends meetings of clubs or organizations: Not on file    Relationship status: Not on file  .  Intimate partner violence    Fear of current or ex partner: Not on file    Emotionally abused: Not on file    Physically abused: Not on file    Forced sexual activity: Not on file  Other Topics Concern  . Not on file  Social History Narrative   Lives at home with wife; in [place]. retd from [employer] smoker; drinks liquor. Children - grown up.     FAMILY HISTORY: Family History  Adopted: Yes    ALLERGIES:  is allergic to azithromycin.  MEDICATIONS:  Current Outpatient Medications  Medication Sig Dispense Refill  . amoxicillin-clavulanate (AUGMENTIN) 875-125 MG tablet Take 1 tablet by mouth 2 (two) times daily.    . predniSONE (DELTASONE) 50 MG tablet Take 2 tablets once day x 5 days; START AFTER THE BIOPSY; 10 tablet 0   No current facility-administered medications for this visit.       Marland Kitchen  PHYSICAL EXAMINATION: ECOG PERFORMANCE STATUS: 1 - Symptomatic but completely ambulatory  Vitals:   07/03/19 1427  BP: 103/67  Pulse: (!) 104  Resp: (!) 24  Temp: (!) 97.1 F (36.2 C)  SpO2: 94%   There were no vitals filed for this visit.  Physical Exam  Constitutional: He is oriented to person, place, and time and well-developed, well-nourished, and in no distress.  HENT:  Head: Normocephalic and atraumatic.  Mouth/Throat: Oropharynx is clear and moist. No oropharyngeal exudate.  Eyes: Pupils are equal, round, and reactive to light.  Neck: Normal range of motion. Neck supple.  Bilateral bulky lymphadenopathy left more than right.  Bilateral axillary adenopathy 1 to 2 cm in size.  Bilateral inguinal adenopathy 1 to 2 cm in size.  Cardiovascular: Normal rate and regular rhythm.  Pulmonary/Chest: Effort normal and breath sounds normal. No respiratory distress. He has no wheezes.  Abdominal: Soft. Bowel sounds are normal. He exhibits no distension and no mass. There is no abdominal tenderness. There is no rebound and no guarding.  Musculoskeletal: Normal range of motion.         General: No tenderness or edema.  Neurological: He is alert and oriented to person, place, and time.  Skin: Skin is warm.  Psychiatric: Affect normal.     LABORATORY DATA:  I have reviewed the data as listed Lab Results  Component Value Date   WBC 10.3 03/01/2014   HGB 9.3 (L) 03/01/2014   HCT 27.9 (L) 03/01/2014   MCV 91 03/01/2014   PLT 184 03/01/2014   Recent Labs    06/29/19 1018  CREATININE 1.00    RADIOGRAPHIC STUDIES: I have personally reviewed the radiological images as listed and agreed with the findings in the report. Ct Soft Tissue Neck W Contrast  Result Date: 06/29/2019 CLINICAL DATA:  Dysphagia and intermittent hoarseness over the last month. Smoking history. EXAM: CT NECK WITH CONTRAST  TECHNIQUE: Multidetector CT imaging of the neck was performed using the standard protocol following the bolus administration of intravenous contrast. CONTRAST:  23m OMNIPAQUE IOHEXOL 300 MG/ML  SOLN COMPARISON:  None. FINDINGS: Pharynx and larynx: There is a 6 x 4 x 4 cm mass at the tongue base/vallecula, flattening the epiglottis inferiorly and encroaching markedly upon the oropharynx. This appears fairly well circumscribed and has homogeneous density. Massive bilateral neck adenopathy left worse than right described below without necrosis leads me to favor the diagnosis of lymphoma, with this large oropharyngeal mass probably representing lymphoma within the lingual tonsillar tissue, more likely than a primary squamous cell carcinoma. Salivary glands: Parotid and submandibular glands are intrinsically normal. Slight prominence intraparotid nodes. Thyroid: Normal Lymph nodes: Massive bilateral neck lymphadenopathy. Largest node on the right is a level 2 node with diameter of 2.6 cm. Massive nodal confluence at level 2 on the left measures 6.4 x 5.1 cm axial image 50. Adenopathy in the lower neck is much more bulky on the left than the right including extensive supraclavicular  lymphadenopathy, again left more than right. None of these nodes show necrosis. The pattern favors lymphoma over metastatic disease. Smaller nodes are evident in the axillary regions and superior mediastinum, probably also involved. Vascular: Normal Limited intracranial: Normal Visualized orbits: Normal Mastoids and visualized paranasal sinuses: Mucosal thickening and or cysts/polyps in the left maxillary sinus. Lesser mucosal thickening in the right maxillary sinus. Scattered opacified ethmoid air cells. Skeleton: Degenerative cervical spondylosis. Upper chest: No lung pathology. Small pathologic superior mediastinal nodes as discussed above. Other: None IMPRESSION: Massive lymphadenopathy pattern more extensive on the left than the right. 6 x 4 x 4 cm mass at the tongue base/vallecula with displacement of the epiglottis and marked encroachment upon the oropharynx. In this setting, this is felt to be lymphoid tissue involvement by the same process, probably malignant lymphoma, rather than a primary squamous cell carcinoma of the tongue base. Density of this mass and of the lymph nodes is homogeneous and there are no necrotic nodes. See above for full discussion. Lesser involvement of the nodes in the superior mediastinum axillary regions. Electronically Signed   By: MNelson ChimesM.D.   On: 06/29/2019 13:02    ASSESSMENT & PLAN:   Lymphoma of lymph nodes ([facility]) #Clinically highly suspicious of malignant lymphoma-seems aggressive based on clinical history.  Spoke to interventional radiology-kindly agrees to move the core biopsy tomorrow 9/2. -   #Had a long discussion the patient regarding the need for accurate diagnosis for treatment and prognostication.  Patient also needs a PET scan ASAP for staging purposes.  Also discussed the possible need for bone marrow biopsy.  #Discussed with the patient that he will need systemic chemotherapy-as patient is quite symptomatic;when confirmed lymphoma on biopsy.  In  general goal for lymphoma treatment is cure.  However it is difficult to prognosticate or to give specific treatment options unless final pathology available.  Called in prescription for prednisone to his pharmacy which he needs to start after his biopsy-as he is quite symptomatic.  # Thank you Dr. VPryor Ochoafor allowing me to participate in the care of your pleasant patient. Please do not hesitate to contact me with questions or concerns in the interim.  Discussed with the patient's wife at length.  Informed Dr. VPryor Ochoa   # DISPOSITION: # labs today # PET ASAP # follow up in 1 week- DR.B  # I reviewed the blood work- with the patient in detail; also reviewed the  imaging independently [as summarized above]; and with the patient in detail.    # 60 minutes face-to-face with the patient discussing the above plan of care; more than 50% of time spent on prognosis/ natural history; counseling and coordination.   All questions were answered. The patient knows to call the clinic with any problems, questions or concerns.    Cammie Sickle, MD 07/03/2019 3:44 PM

## 2019-07-03 NOTE — Assessment & Plan Note (Addendum)
#  Clinically highly suspicious of malignant lymphoma-seems aggressive based on clinical history.  Spoke to interventional radiology-kindly agrees to move the core biopsy tomorrow 9/2. -   #Had a long discussion the patient regarding the need for accurate diagnosis for treatment and prognostication.  Patient also needs a PET scan ASAP for staging purposes.  Also discussed the possible need for bone marrow biopsy.  #Discussed with the patient that he will need systemic chemotherapy-as patient is quite symptomatic;when confirmed lymphoma on biopsy.  In general goal for lymphoma treatment is cure.  However it is difficult to prognosticate or to give specific treatment options unless final pathology available.  Called in prescription for prednisone to his pharmacy which he needs to start after his biopsy-as he is quite symptomatic.  # Thank you Dr. Pryor Ochoa for allowing me to participate in the care of your pleasant patient. Please do not hesitate to contact me with questions or concerns in the interim.  Discussed with the patient's wife at length.  Informed Dr. Pryor Ochoa.   # DISPOSITION: # labs today # PET ASAP # follow up in 1 week- DR.B  # I reviewed the blood work- with the patient in detail; also reviewed the imaging independently [as summarized above]; and with the patient in detail.    # 60 minutes face-to-face with the patient discussing the above plan of care; more than 50% of time spent on prognosis/ natural history; counseling and coordination.

## 2019-07-03 NOTE — Progress Notes (Signed)
Dr Rogue Bussing calling earlier this afternoon, requesting Cervical LN biopsy to be moved to tomorrow 07/04/2019 vs this Friday. Was able to do so. I called and spoke with patient's wife Caren Griffins, regarding patient coming in @ 1000 for 1100 procedure. Questions answered with wife regarding NPO after midnight prior to procedure.

## 2019-07-04 ENCOUNTER — Ambulatory Visit
Admission: RE | Admit: 2019-07-04 | Discharge: 2019-07-04 | Disposition: A | Discharge status: Home / Self Care | Payer: Medicare Other | Source: Ambulatory Visit | Attending: Otolaryngology | Admitting: Otolaryngology

## 2019-07-04 DIAGNOSIS — D3702 Neoplasm of uncertain behavior of tongue: Secondary | ICD-10-CM

## 2019-07-04 DIAGNOSIS — C8331 Diffuse large B-cell lymphoma, lymph nodes of head, face, and neck: Secondary | ICD-10-CM | POA: Diagnosis not present

## 2019-07-04 DIAGNOSIS — R599 Enlarged lymph nodes, unspecified: Secondary | ICD-10-CM | POA: Diagnosis not present

## 2019-07-04 DIAGNOSIS — K148 Other diseases of tongue: Secondary | ICD-10-CM | POA: Diagnosis not present

## 2019-07-04 DIAGNOSIS — R590 Localized enlarged lymph nodes: Secondary | ICD-10-CM | POA: Diagnosis not present

## 2019-07-04 LAB — BETA 2 MICROGLOBULIN, SERUM: Beta-2 Microglobulin: 5 mg/L — ABNORMAL HIGH (ref 0.6–2.4)

## 2019-07-04 LAB — MULTIPLE MYELOMA PANEL, SERUM
Albumin SerPl Elph-Mcnc: 3.5 g/dL (ref 2.9–4.4)
Albumin/Glob SerPl: 1.3 (ref 0.7–1.7)
Alpha 1: 0.3 g/dL (ref 0.0–0.4)
Alpha2 Glob SerPl Elph-Mcnc: 0.6 g/dL (ref 0.4–1.0)
B-Globulin SerPl Elph-Mcnc: 0.9 g/dL (ref 0.7–1.3)
Gamma Glob SerPl Elph-Mcnc: 0.9 g/dL (ref 0.4–1.8)
Globulin, Total: 2.7 g/dL (ref 2.2–3.9)
IgA: 326 mg/dL (ref 61–437)
IgG (Immunoglobin G), Serum: 893 mg/dL (ref 603–1613)
IgM (Immunoglobulin M), Srm: 120 mg/dL (ref 20–172)
Total Protein ELP: 6.2 g/dL (ref 6.0–8.5)

## 2019-07-04 LAB — HEPATITIS B CORE ANTIBODY, IGM: Hep B C IgM: NEGATIVE

## 2019-07-04 LAB — HEPATITIS B SURFACE ANTIGEN: Hepatitis B Surface Ag: NEGATIVE

## 2019-07-04 LAB — HEPATITIS C ANTIBODY: HCV Ab: <0.1 s/co ratio (ref 0.0–0.9)

## 2019-07-04 MED ORDER — HYDROCODONE-ACETAMINOPHEN 5-325 MG PO TABS: 1.0000 | ORAL_TABLET | ORAL | Status: DC | PRN

## 2019-07-04 MED ORDER — FENTANYL CITRATE (PF) 100 MCG/2ML IJ SOLN
INTRAMUSCULAR | Status: AC
  Filled 2019-07-04: qty 2

## 2019-07-04 MED ORDER — MIDAZOLAM HCL 2 MG/2ML IJ SOLN
INTRAMUSCULAR | Status: AC
  Filled 2019-07-04: qty 2

## 2019-07-04 MED ORDER — SODIUM CHLORIDE 0.9 % IV SOLN: INTRAVENOUS | Status: DC

## 2019-07-04 NOTE — Procedures (Signed)
[place] Bx of left cervical node beneath mandible without difficulty  Complications:  None  Blood Loss: none  See dictation in canopy pacs

## 2019-07-04 NOTE — Progress Notes (Signed)
Patient eating lunch, clinically stable post Left Cervical Lymph node biopsy per Dr Golden Circle. Tolerated well. Discharge instructions given to Cynthia/wife and patient over phone with questions answered. Denies complaints. Dressing to left neck dry/c/intact.

## 2019-07-05 ENCOUNTER — Telehealth: Payer: Self-pay | Admitting: Internal Medicine

## 2019-07-05 NOTE — Telephone Encounter (Signed)
LVM for pt to check on his clinical condition on prednisone; while awaiting Biopsy.   GB

## 2019-07-06 ENCOUNTER — Ambulatory Visit: Payer: Medicare Other

## 2019-07-09 ENCOUNTER — Telehealth: Payer: Self-pay | Admitting: Internal Medicine

## 2019-07-09 NOTE — Telephone Encounter (Signed)
Spoke to pt & wife  on 9/04 re: results of prelim results of path of DLBCL- awaiting PET scan of 9/08; follow up with me as planned on 09/09.  GB

## 2019-07-10 ENCOUNTER — Ambulatory Visit: Payer: Medicare Other | Admitting: Internal Medicine

## 2019-07-10 ENCOUNTER — Ambulatory Visit
Admission: RE | Admit: 2019-07-10 | Discharge: 2019-07-10 | Disposition: A | Discharge status: Home / Self Care | Payer: Medicare Other | Source: Ambulatory Visit | Attending: Internal Medicine | Admitting: Internal Medicine

## 2019-07-10 ENCOUNTER — Other Ambulatory Visit: Payer: Self-pay

## 2019-07-10 DIAGNOSIS — R590 Localized enlarged lymph nodes: Secondary | ICD-10-CM | POA: Insufficient documentation

## 2019-07-10 DIAGNOSIS — C8590 Non-Hodgkin lymphoma, unspecified, unspecified site: Secondary | ICD-10-CM | POA: Insufficient documentation

## 2019-07-10 LAB — GLUCOSE, CAPILLARY: Glucose-Capillary: 91 mg/dL (ref 70–99)

## 2019-07-10 MED ORDER — FLUDEOXYGLUCOSE F - 18 (FDG) INJECTION
14.5000 | Freq: Once | INTRAVENOUS | Status: AC | PRN
  Administered 2019-07-10: 14.45 via INTRAVENOUS

## 2019-07-10 NOTE — Progress Notes (Signed)
Patient is coming in for follow up, his hearing is not the best. He has a hard time understanding. He needs you to speak slowly and more clearly as he has a hard time hearing with mask on and is good at lip reading. He does not have hearing aids.

## 2019-07-11 ENCOUNTER — Other Ambulatory Visit: Payer: Self-pay

## 2019-07-11 ENCOUNTER — Inpatient Hospital Stay: Payer: Medicare Other | Admitting: Internal Medicine

## 2019-07-11 ENCOUNTER — Other Ambulatory Visit: Payer: Medicare Other

## 2019-07-11 ENCOUNTER — Other Ambulatory Visit
Admission: RE | Admit: 2019-07-11 | Discharge: 2019-07-11 | Disposition: A | Discharge status: Home / Self Care | Payer: Medicare Other | Source: Ambulatory Visit | Attending: Internal Medicine | Admitting: Internal Medicine

## 2019-07-11 ENCOUNTER — Other Ambulatory Visit: Payer: Self-pay | Admitting: *Deleted

## 2019-07-11 ENCOUNTER — Encounter: Payer: Self-pay | Admitting: Internal Medicine

## 2019-07-11 VITALS — BP 120/76 | HR 105 | Temp 98.7°F | Ht 72.0 in | Wt 275.0 lb

## 2019-07-11 DIAGNOSIS — C8338 Diffuse large B-cell lymphoma, lymph nodes of multiple sites: Secondary | ICD-10-CM | POA: Diagnosis not present

## 2019-07-11 DIAGNOSIS — C8590 Non-Hodgkin lymphoma, unspecified, unspecified site: Secondary | ICD-10-CM

## 2019-07-11 DIAGNOSIS — R590 Localized enlarged lymph nodes: Secondary | ICD-10-CM | POA: Diagnosis not present

## 2019-07-11 DIAGNOSIS — Z20828 Contact with and (suspected) exposure to other viral communicable diseases: Secondary | ICD-10-CM | POA: Diagnosis not present

## 2019-07-11 DIAGNOSIS — Z5189 Encounter for other specified aftercare: Secondary | ICD-10-CM | POA: Diagnosis not present

## 2019-07-11 DIAGNOSIS — Z5112 Encounter for antineoplastic immunotherapy: Secondary | ICD-10-CM | POA: Diagnosis not present

## 2019-07-11 DIAGNOSIS — Z5111 Encounter for antineoplastic chemotherapy: Secondary | ICD-10-CM

## 2019-07-11 DIAGNOSIS — Z01812 Encounter for preprocedural laboratory examination: Secondary | ICD-10-CM | POA: Insufficient documentation

## 2019-07-11 MED ORDER — LIDOCAINE-PRILOCAINE 2.5-2.5 % EX CREA: TOPICAL_CREAM | CUTANEOUS | 0 refills | Status: DC

## 2019-07-11 MED ORDER — PREDNISONE 50 MG PO TABS: ORAL_TABLET | ORAL | 4 refills | Status: DC

## 2019-07-11 MED ORDER — PROCHLORPERAZINE MALEATE 10 MG PO TABS: 10.0000 mg | ORAL_TABLET | Freq: Four times a day (QID) | ORAL | 1 refills | Status: DC | PRN

## 2019-07-11 MED ORDER — ONDANSETRON HCL 8 MG PO TABS: ORAL_TABLET | ORAL | 1 refills | Status: DC

## 2019-07-11 MED ORDER — ALLOPURINOL 300 MG PO TABS: 300.0000 mg | ORAL_TABLET | Freq: Two times a day (BID) | ORAL | 0 refills | Status: DC

## 2019-07-11 NOTE — Assessment & Plan Note (Addendum)
#  Diffuse large B-cell lymphoma-ABC subtype; c-myc staining/FISH studies pending.  Based on PET scan; stage IV [involvement of the spleen]; hold off bone marrow biopsy.  #  Discussed mechanism of action of rituximab;and also potential side effects including but not limited to infusion reactions;rare infections/activation including hepatitis.  Hepatitis work-up negative.   # Discussed R-CHOP chemotherapy every 3 weeks x 6 cycles.  Discussed therapies cure. I would do interim scan after 2-3 cycles of chemo.  I discussed at length regarding the individual drugs in R-CHOP. Discussed the potential side effects including but not limited to-increasing fatigue, nausea vomiting, diarrhea, hair loss, sores in the mouth, increase risk of infection and also neuropathy. Also discussed regarding potential side effects of heart failure/leukemia with Adriamycin.  2D echo-pending.  Growth factor would be given as prophylaxis for chemotherapy-induced neutropenia to prevent febrile neutropenias. Discussed potential side effect- myalgias/arthralgias.   #Recommend starting therapy with Rituxan prednisone ASAP 9/11.  R CHOP chemotherapy will be started hopefully in 1 week after port and 2D echo.  # # Chemotherapy education; port placement.  Antiemetics-Zofran and Compazine; EMLA cream sent to pharmacy.   # DISPOSITION: # 9/11- labs- cbc/bmp/ldh; Ritixan # covid-19 today # 2 d echo/ mediport/ ASAP/ chemo class- at 9 tomorrow # 9/18- MD-labs-cbc/bmp/ldh- Freddy Finner; udenyca 9/21 -Dr.B  # I reviewed the blood work- with the patient in detail; also reviewed the imaging independently [as summarized above]; and with the patient in detail.  Also discussed with the patient's wife over the phone.  # 40 minutes face-to-face with the patient discussing the above plan of care; more than 50% of time spent on prognosis/ natural history; counseling and coordination.

## 2019-07-11 NOTE — Patient Instructions (Signed)
Rituximab injection What is this medicine? RITUXIMAB (ri TUX i mab) is a monoclonal antibody. It is used to treat certain types of cancer like non-Hodgkin lymphoma and chronic lymphocytic leukemia. It is also used to treat rheumatoid arthritis, granulomatosis with polyangiitis (or Wegener's granulomatosis), microscopic polyangiitis, and pemphigus vulgaris. This medicine may be used for other purposes; ask your health care provider or pharmacist if you have questions. COMMON BRAND NAME(S): Rituxan, RUXIENCE What should I tell my health care provider before I take this medicine? They need to know if you have any of these conditions:  heart disease  infection (especially a virus infection such as hepatitis B, chickenpox, cold sores, or herpes)  immune system problems  irregular heartbeat  kidney disease  low blood counts, like low white cell, platelet, or red cell counts  lung or breathing disease, like asthma  recently received or scheduled to receive a vaccine  an unusual or allergic reaction to rituximab, other medicines, foods, dyes, or preservatives  pregnant or trying to get pregnant  breast-feeding How should I use this medicine? This medicine is for infusion into a vein. It is administered in a hospital or clinic by a specially trained health care professional. A special MedGuide will be given to you by the pharmacist with each prescription and refill. Be sure to read this information carefully each time. Talk to your pediatrician regarding the use of this medicine in children. This medicine is not approved for use in children. Overdosage: If you think you have taken too much of this medicine contact a poison control center or emergency room at once. NOTE: This medicine is only for you. Do not share this medicine with others. What if I miss a dose? It is important not to miss a dose. Call your doctor or health care professional if you are unable to keep an appointment. What  may interact with this medicine?  cisplatin  live virus vaccines This list may not describe all possible interactions. Give your health care provider a list of all the medicines, herbs, non-prescription drugs, or dietary supplements you use. Also tell them if you smoke, drink alcohol, or use illegal drugs. Some items may interact with your medicine. What should I watch for while using this medicine? Your condition will be monitored carefully while you are receiving this medicine. You may need blood work done while you are taking this medicine. This medicine can cause serious allergic reactions. To reduce your risk you may need to take medicine before treatment with this medicine. Take your medicine as directed. In some patients, this medicine may cause a serious brain infection that may cause death. If you have any problems seeing, thinking, speaking, walking, or standing, tell your healthcare professional right away. If you cannot reach your healthcare professional, urgently seek other source of medical care. Call your doctor or health care professional for advice if you get a fever, chills or sore throat, or other symptoms of a cold or flu. Do not treat yourself. This drug decreases your body's ability to fight infections. Try to avoid being around people who are sick. Do not become pregnant while taking this medicine or for at least 12 months after stopping it. Women should inform their doctor if they wish to become pregnant or think they might be pregnant. There is a potential for serious side effects to an unborn child. Talk to your health care professional or pharmacist for more information. Do not breast-feed an infant while taking this medicine or for at   least 6 months after stopping it. What side effects may I notice from receiving this medicine? Side effects that you should report to your doctor or health care professional as soon as possible:  allergic reactions like skin rash, itching or  hives; swelling of the face, lips, or tongue  breathing problems  chest pain  changes in vision  diarrhea  headache with fever, neck stiffness, sensitivity to light, nausea, or confusion  fast, irregular heartbeat  loss of memory  low blood counts - this medicine may decrease the number of white blood cells, red blood cells and platelets. You may be at increased risk for infections and bleeding.  mouth sores  problems with balance, talking, or walking  redness, blistering, peeling or loosening of the skin, including inside the mouth  signs of infection - fever or chills, cough, sore throat, pain or difficulty passing urine  signs and symptoms of kidney injury like trouble passing urine or change in the amount of urine  signs and symptoms of liver injury like dark yellow or brown urine; general ill feeling or flu-like symptoms; light-colored stools; loss of appetite; nausea; right upper belly pain; unusually weak or tired; yellowing of the eyes or skin  signs and symptoms of low blood pressure like dizziness; feeling faint or lightheaded, falls; unusually weak or tired  stomach pain  swelling of the ankles, feet, hands  unusual bleeding or bruising  vomiting Side effects that usually do not require medical attention (report to your doctor or health care professional if they continue or are bothersome):  headache  joint pain  muscle cramps or muscle pain  nausea  tiredness This list may not describe all possible side effects. Call your doctor for medical advice about side effects. You may report side effects to FDA at 1-800-FDA-1088. Where should I keep my medicine? This drug is given in a hospital or clinic and will not be stored at home. NOTE: This sheet is a summary. It may not cover all possible information. If you have questions about this medicine, talk to your doctor, pharmacist, or health care provider.  2020 Elsevier/Gold Standard (2018-11-29  22:01:36) Doxorubicin injection What is this medicine? DOXORUBICIN (dox oh ROO bi sin) is a chemotherapy drug. It is used to treat many kinds of cancer like leukemia, lymphoma, neuroblastoma, sarcoma, and Wilms' tumor. It is also used to treat bladder cancer, breast cancer, lung cancer, ovarian cancer, stomach cancer, and thyroid cancer. This medicine may be used for other purposes; ask your health care provider or pharmacist if you have questions. COMMON BRAND NAME(S): Adriamycin, Adriamycin PFS, Adriamycin RDF, Rubex What should I tell my health care provider before I take this medicine? They need to know if you have any of these conditions:  heart disease  history of low blood counts caused by a medicine  liver disease  recent or ongoing radiation therapy  an unusual or allergic reaction to doxorubicin, other chemotherapy agents, other medicines, foods, dyes, or preservatives  pregnant or trying to get pregnant  breast-feeding How should I use this medicine? This drug is given as an infusion into a vein. It is administered in a hospital or clinic by a specially trained health care professional. If you have pain, swelling, burning or any unusual feeling around the site of your injection, tell your health care professional right away. Talk to your pediatrician regarding the use of this medicine in children. Special care may be needed. Overdosage: If you think you have taken too much of  this medicine contact a poison control center or emergency room at once. NOTE: This medicine is only for you. Do not share this medicine with others. What if I miss a dose? It is important not to miss your dose. Call your doctor or health care professional if you are unable to keep an appointment. What may interact with this medicine? This medicine may interact with the following medications:  6-mercaptopurine  paclitaxel  phenytoin  St. Gwendolyn's Wort  trastuzumab  verapamil This list may not  describe all possible interactions. Give your health care provider a list of all the medicines, herbs, non-prescription drugs, or dietary supplements you use. Also tell them if you smoke, drink alcohol, or use illegal drugs. Some items may interact with your medicine. What should I watch for while using this medicine? This drug may make you feel generally unwell. This is not uncommon, as chemotherapy can affect healthy cells as well as cancer cells. Report any side effects. Continue your course of treatment even though you feel ill unless your doctor tells you to stop. There is a maximum amount of this medicine you should receive throughout your life. The amount depends on the medical condition being treated and your overall health. Your doctor will watch how much of this medicine you receive in your lifetime. Tell your doctor if you have taken this medicine before. You may need blood work done while you are taking this medicine. Your urine may turn red for a few days after your dose. This is not blood. If your urine is dark or brown, call your doctor. In some cases, you may be given additional medicines to help with side effects. Follow all directions for their use. Call your doctor or health care professional for advice if you get a fever, chills or sore throat, or other symptoms of a cold or flu. Do not treat yourself. This drug decreases your body's ability to fight infections. Try to avoid being around people who are sick. This medicine may increase your risk to bruise or bleed. Call your doctor or health care professional if you notice any unusual bleeding. Talk to your doctor about your risk of cancer. You may be more at risk for certain types of cancers if you take this medicine. Do not become pregnant while taking this medicine or for 6 months after stopping it. Women should inform their doctor if they wish to become pregnant or think they might be pregnant. Men should not father a child while  taking this medicine and for 6 months after stopping it. There is a potential for serious side effects to an unborn child. Talk to your health care professional or pharmacist for more information. Do not breast-feed an infant while taking this medicine. This medicine has caused ovarian failure in some women and reduced sperm counts in some men This medicine may interfere with the ability to have a child. Talk with your doctor or health care professional if you are concerned about your fertility. This medicine may cause a decrease in Co-Enzyme Q-10. You should make sure that you get enough Co-Enzyme Q-10 while you are taking this medicine. Discuss the foods you eat and the vitamins you take with your health care professional. What side effects may I notice from receiving this medicine? Side effects that you should report to your doctor or health care professional as soon as possible:  allergic reactions like skin rash, itching or hives, swelling of the face, lips, or tongue  breathing problems  chest  pain  fast or irregular heartbeat  low blood counts - this medicine may decrease the number of white blood cells, red blood cells and platelets. You may be at increased risk for infections and bleeding.  pain, redness, or irritation at site where injected  signs of infection - fever or chills, cough, sore throat, pain or difficulty passing urine  signs of decreased platelets or bleeding - bruising, pinpoint red spots on the skin, black, tarry stools, blood in the urine  swelling of the ankles, feet, hands  tiredness  weakness Side effects that usually do not require medical attention (report to your doctor or health care professional if they continue or are bothersome):  diarrhea  hair loss  mouth sores  nail discoloration or damage  nausea  red colored urine  vomiting This list may not describe all possible side effects. Call your doctor for medical advice about side effects.  You may report side effects to FDA at 1-800-FDA-1088. Where should I keep my medicine? This drug is given in a hospital or clinic and will not be stored at home. NOTE: This sheet is a summary. It may not cover all possible information. If you have questions about this medicine, talk to your doctor, pharmacist, or health care provider.  2020 Elsevier/Gold Standard (2017-06-01 11:01:26) Vincristine injection What is this medicine? VINCRISTINE (vin KRIS teen) is a chemotherapy drug. It slows the growth of cancer cells. This medicine is used to treat many types of cancer like Hodgkin's disease, leukemia, non-Hodgkin's lymphoma, neuroblastoma (brain cancer), rhabdomyosarcoma, and Wilms' tumor. This medicine may be used for other purposes; ask your health care provider or pharmacist if you have questions. COMMON BRAND NAME(S): Oncovin, Vincasar PFS What should I tell my health care provider before I take this medicine? They need to know if you have any of these conditions:  blood disorders  gout  infection (especially chickenpox, cold sores, or herpes)  kidney disease  liver disease  lung disease  nervous system disease like Charcot-Marie-Tooth (CMT)  recent or ongoing radiation therapy  an unusual or allergic reaction to vincristine, other chemotherapy agents, other medicines, foods, dyes, or preservatives  pregnant or trying to get pregnant  breast-feeding How should I use this medicine? This drug is given as an infusion into a vein. It is administered in a hospital or clinic by a specially trained health care professional. If you have pain, swelling, burning, or any unusual feeling around the site of your injection, tell your health care professional right away. Talk to your pediatrician regarding the use of this medicine in children. While this drug may be prescribed for selected conditions, precautions do apply. Overdosage: If you think you have taken too much of this medicine  contact a poison control center or emergency room at once. NOTE: This medicine is only for you. Do not share this medicine with others. What if I miss a dose? It is important not to miss your dose. Call your doctor or health care professional if you are unable to keep an appointment. What may interact with this medicine? Do not take this medicine with any of the following medications:  itraconazole  mibefradil  voriconazole This medicine may also interact with the following medications:  cyclosporine  erythromycin  fluconazole  ketoconazole  medicines for HIV like delavirdine, efavirenz, nevirapine  medicines for seizures like ethotoin, fosphenotoin, phenytoin  medicines to increase blood counts like filgrastim, pegfilgrastim, sargramostim  other chemotherapy drugs like cisplatin, L-asparaginase, methotrexate, mitomycin, paclitaxel  pegaspargase  vaccines  zalcitabine, ddC Talk to your doctor or health care professional before taking any of these medicines:  acetaminophen  aspirin  ibuprofen  ketoprofen  naproxen This list may not describe all possible interactions. Give your health care provider a list of all the medicines, herbs, non-prescription drugs, or dietary supplements you use. Also tell them if you smoke, drink alcohol, or use illegal drugs. Some items may interact with your medicine. What should I watch for while using this medicine? Your condition will be monitored carefully while you are receiving this medicine. You will need important blood work done while you are taking this medicine. This drug may make you feel generally unwell. This is not uncommon, as chemotherapy can affect healthy cells as well as cancer cells. Report any side effects. Continue your course of treatment even though you feel ill unless your doctor tells you to stop. In some cases, you may be given additional medicines to help with side effects. Follow all directions for their  use. Call your doctor or health care professional for advice if you get a fever, chills or sore throat, or other symptoms of a cold or flu. Do not treat yourself. Avoid taking products that contain aspirin, acetaminophen, ibuprofen, naproxen, or ketoprofen unless instructed by your doctor. These medicines may hide a fever. Do not become pregnant while taking this medicine. Women should inform their doctor if they wish to become pregnant or think they might be pregnant. There is a potential for serious side effects to an unborn child. Talk to your health care professional or pharmacist for more information. Do not breast-feed an infant while taking this medicine. Men may have a lower sperm count while taking this medicine. Talk to your doctor if you plan to father a child. What side effects may I notice from receiving this medicine? Side effects that you should report to your doctor or health care professional as soon as possible:  allergic reactions like skin rash, itching or hives, swelling of the face, lips, or tongue  breathing problems  confusion or changes in emotions or moods  constipation  cough  mouth sores  muscle weakness  nausea and vomiting  pain, swelling, redness or irritation at the injection site  pain, tingling, numbness in the hands or feet  problems with balance, talking, walking  seizures  stomach pain  trouble passing urine or change in the amount of urine Side effects that usually do not require medical attention (report to your doctor or health care professional if they continue or are bothersome):  diarrhea  hair loss  jaw pain  loss of appetite This list may not describe all possible side effects. Call your doctor for medical advice about side effects. You may report side effects to FDA at 1-800-FDA-1088. Where should I keep my medicine? This drug is given in a hospital or clinic and will not be stored at home. NOTE: This sheet is a summary. It  may not cover all possible information. If you have questions about this medicine, talk to your doctor, pharmacist, or health care provider.  2020 Elsevier/Gold Standard (2008-07-15 17:17:13) Cyclophosphamide injection What is this medicine? CYCLOPHOSPHAMIDE (sye kloe FOSS fa mide) is a chemotherapy drug. It slows the growth of cancer cells. This medicine is used to treat many types of cancer like lymphoma, myeloma, leukemia, breast cancer, and ovarian cancer, to name a few. This medicine may be used for other purposes; ask your health care provider or pharmacist if you have questions. COMMON BRAND  NAME(S): Cytoxan, Neosar What should I tell my health care provider before I take this medicine? They need to know if you have any of these conditions:  blood disorders  history of other chemotherapy  infection  kidney disease  liver disease  recent or ongoing radiation therapy  tumors in the bone marrow  an unusual or allergic reaction to cyclophosphamide, other chemotherapy, other medicines, foods, dyes, or preservatives  pregnant or trying to get pregnant  breast-feeding How should I use this medicine? This drug is usually given as an injection into a vein or muscle or by infusion into a vein. It is administered in a hospital or clinic by a specially trained health care professional. Talk to your pediatrician regarding the use of this medicine in children. Special care may be needed. Overdosage: If you think you have taken too much of this medicine contact a poison control center or emergency room at once. NOTE: This medicine is only for you. Do not share this medicine with others. What if I miss a dose? It is important not to miss your dose. Call your doctor or health care professional if you are unable to keep an appointment. What may interact with this medicine? This medicine may interact with the following medications:  amiodarone  amphotericin B  azathioprine  certain  antiviral medicines for HIV or AIDS such as protease inhibitors (e.g., indinavir, ritonavir) and zidovudine  certain blood pressure medications such as benazepril, captopril, enalapril, fosinopril, lisinopril, moexipril, monopril, perindopril, quinapril, ramipril, trandolapril  certain cancer medications such as anthracyclines (e.g., daunorubicin, doxorubicin), busulfan, cytarabine, paclitaxel, pentostatin, tamoxifen, trastuzumab  certain diuretics such as chlorothiazide, chlorthalidone, hydrochlorothiazide, indapamide, metolazone  certain medicines that treat or prevent blood clots like warfarin  certain muscle relaxants such as succinylcholine  cyclosporine  etanercept  indomethacin  medicines to increase blood counts like filgrastim, pegfilgrastim, sargramostim  medicines used as general anesthesia  metronidazole  natalizumab This list may not describe all possible interactions. Give your health care provider a list of all the medicines, herbs, non-prescription drugs, or dietary supplements you use. Also tell them if you smoke, drink alcohol, or use illegal drugs. Some items may interact with your medicine. What should I watch for while using this medicine? Visit your doctor for checks on your progress. This drug may make you feel generally unwell. This is not uncommon, as chemotherapy can affect healthy cells as well as cancer cells. Report any side effects. Continue your course of treatment even though you feel ill unless your doctor tells you to stop. Drink water or other fluids as directed. Urinate often, even at night. In some cases, you may be given additional medicines to help with side effects. Follow all directions for their use. Call your doctor or health care professional for advice if you get a fever, chills or sore throat, or other symptoms of a cold or flu. Do not treat yourself. This drug decreases your body's ability to fight infections. Try to avoid being around  people who are sick. This medicine may increase your risk to bruise or bleed. Call your doctor or health care professional if you notice any unusual bleeding. Be careful brushing and flossing your teeth or using a toothpick because you may get an infection or bleed more easily. If you have any dental work done, tell your dentist you are receiving this medicine. You may get drowsy or dizzy. Do not drive, use machinery, or do anything that needs mental alertness until you know how this medicine  affects you. Do not become pregnant while taking this medicine or for 1 year after stopping it. Women should inform their doctor if they wish to become pregnant or think they might be pregnant. Men should not father a child while taking this medicine and for 4 months after stopping it. There is a potential for serious side effects to an unborn child. Talk to your health care professional or pharmacist for more information. Do not breast-feed an infant while taking this medicine. This medicine may interfere with the ability to have a child. This medicine has caused ovarian failure in some women. This medicine has caused reduced sperm counts in some men. You should talk with your doctor or health care professional if you are concerned about your fertility. If you are going to have surgery, tell your doctor or health care professional that you have taken this medicine. What side effects may I notice from receiving this medicine? Side effects that you should report to your doctor or health care professional as soon as possible:  allergic reactions like skin rash, itching or hives, swelling of the face, lips, or tongue  low blood counts - this medicine may decrease the number of white blood cells, red blood cells and platelets. You may be at increased risk for infections and bleeding.  signs of infection - fever or chills, cough, sore throat, pain or difficulty passing urine  signs of decreased platelets or bleeding  - bruising, pinpoint red spots on the skin, black, tarry stools, blood in the urine  signs of decreased red blood cells - unusually weak or tired, fainting spells, lightheadedness  breathing problems  dark urine  dizziness  palpitations  swelling of the ankles, feet, hands  trouble passing urine or change in the amount of urine  weight gain  yellowing of the eyes or skin Side effects that usually do not require medical attention (report to your doctor or health care professional if they continue or are bothersome):  changes in nail or skin color  hair loss  missed menstrual periods  mouth sores  nausea, vomiting This list may not describe all possible side effects. Call your doctor for medical advice about side effects. You may report side effects to FDA at 1-800-FDA-1088. Where should I keep my medicine? This drug is given in a hospital or clinic and will not be stored at home. NOTE: This sheet is a summary. It may not cover all possible information. If you have questions about this medicine, talk to your doctor, pharmacist, or health care provider.  2020 Elsevier/Gold Standard (2012-09-01 16:22:58)

## 2019-07-11 NOTE — Progress Notes (Signed)
START ON PATHWAY REGIMEN - Lymphoma and CLL     A cycle is every 21 days:     Prednisone      Rituximab-xxxx      Cyclophosphamide      Doxorubicin      Vincristine   **Always confirm dose/schedule in your pharmacy ordering system**  Patient Characteristics: Diffuse Large B-Cell Lymphoma or Follicular Lymphoma, Grade 3B, First Line, Stage III and IV Disease Type: Not Applicable Disease Type: Diffuse Large B-Cell Lymphoma Disease Type: Not Applicable Line of therapy: First Line [place] Stage: IV Intent of Therapy: Curative Intent, Discussed with Patient 

## 2019-07-11 NOTE — Progress Notes (Signed)
[facility] CONSULT NOTE  Patient Care Team: [name], MD as PCP - General (Family Medicine) [name] [name], MD (General Surgery)  CHIEF COMPLAINTS/PURPOSE OF CONSULTATION: Diffuse large B-cell lymphoma  #  Oncology History Overview Note  #June 29, 2019-CT- bulky bilateral neck adenopathy left more than right; September 2020-Diffuse B-cell lymphoma; ABC subtype; awaiting FISH studies.  PET scan -bulky adenopathy involving neck /lymphadenopathy mediastinum /abdomen/pelvis inguinal lymph nodes; splenic involvement.  Question bone.  Stage IV [spleen involvement; no bone marrow biopsy].   # DIAGNOSIS: DLBCL  STAGE:   4      ;GOALS: Cure  CURRENT/MOST RECENT THERAPY : R-CHOP    Lymphoma of lymph nodes ([place])  07/03/2019 Initial Diagnosis   Lymphoma of lymph nodes ([place])   07/11/2019 -  Chemotherapy   The patient had DOXOrubicin (ADRIAMYCIN) chemo injection 126 mg, 50 mg/m2, Intravenous,  Once, 0 of 6 cycles palonosetron (ALOXI) injection 0.25 mg, 0.25 mg, Intravenous,  Once, 0 of 6 cycles pegfilgrastim-cbqv (UDENYCA) injection 6 mg, 6 mg, Subcutaneous, Once, 0 of 6 cycles vinCRIStine (ONCOVIN) 2 mg in sodium chloride 0.9 % 50 mL chemo infusion, 2 mg, Intravenous,  Once, 0 of 6 cycles riTUXimab (RITUXAN) 900 mg in sodium chloride 0.9 % 250 mL (2.6471 mg/mL) infusion, 375 mg/m2, Intravenous,  Once, 0 of 6 cycles cyclophosphamide (CYTOXAN) 1,900 mg in sodium chloride 0.9 % 250 mL chemo infusion, 750 mg/m2, Intravenous,  Once, 0 of 6 cycles fosaprepitant (EMEND) 150 mg, dexamethasone (DECADRON) 12 mg in sodium chloride 0.9 % 145 mL IVPB, , Intravenous,  Once, 0 of 6 cycles  for chemotherapy treatment.      HISTORY OF PRESENTING ILLNESS:  Eric [name] 68 y.o.  male with recently diagnosed bulky lymphadenopathy in the neck suggestive of lymphoma-is here for follow-up to review the results of his biopsy/treatment plan.  Patient continues to have difficulty  swallowing/difficulty breathing at night because of his bulky lymphadenopathy in the neck.  His appetite is good.  Losing weight.  No nausea no vomiting.  Extreme tiredness.  No headaches.  Review of Systems  Constitutional: Positive for malaise/fatigue and weight loss. Negative for chills, diaphoresis and fever.  HENT: Negative for nosebleeds and sore throat.   Eyes: Negative for double vision.  Respiratory: Positive for cough and shortness of breath. Negative for hemoptysis, sputum production and wheezing.   Cardiovascular: Negative for chest pain, palpitations, orthopnea and leg swelling.  Gastrointestinal: Negative for abdominal pain, blood in stool, constipation, diarrhea, heartburn, melena, nausea and vomiting.  Genitourinary: Negative for dysuria, frequency and urgency.  Musculoskeletal: Negative for back pain and joint pain.  Skin: Negative.  Negative for itching and rash.  Neurological: Negative for dizziness, tingling, focal weakness, weakness and headaches.  Endo/Heme/Allergies: Does not bruise/bleed easily.  Psychiatric/Behavioral: Negative for depression. The patient is nervous/anxious. The patient does not have insomnia.      MEDICAL HISTORY:  Past Medical History:  Diagnosis Date  . Colon polyps   . Pancreatitis   . Rectal bleeding 02/24/14    SURGICAL HISTORY: Past Surgical History:  Procedure Laterality Date  . CHOLECYSTECTOMY    . COLONOSCOPY  02/28/14  . POLYPECTOMY     colon polyp removed  . TONSILLECTOMY      SOCIAL HISTORY: Social History   Socioeconomic History  . Marital status: Married    Spouse name: Not on file  . Number of children: Not on file  . Years of education: Not on file  . Highest  education level: Not on file  Occupational History    Comment: Retired from [employer]  . Financial resource strain: Not on file  . Food insecurity    Worry: Not on file    Inability: Not on file  . Transportation needs    Medical: Not on  file    Non-medical: Not on file  Tobacco Use  . Smoking status: Current Every Day Smoker    Packs/day: 1.00    Years: 20.00    Pack years: 20.00    Types: Cigars  . Smokeless tobacco: Never Used  . Tobacco comment: smoke 1/2 ppd cigaretes since 68 yo. changed to cigars at age July 2004.   Substance and Sexual Activity  . Alcohol use: Yes    Comment: 6-7 drinks weekly  . Drug use: No  . Sexual activity: Not on file  Lifestyle  . Physical activity    Days per week: Not on file    Minutes per session: Not on file  . Stress: Not on file  Relationships  . Social Herbalist on phone: Not on file    Gets together: Not on file    Attends religious service: Not on file    Active member of club or organization: Not on file    Attends meetings of clubs or organizations: Not on file    Relationship status: Not on file  . Intimate partner violence    Fear of current or ex partner: Not on file    Emotionally abused: Not on file    Physically abused: Not on file    Forced sexual activity: Not on file  Other Topics Concern  . Not on file  Social History Narrative   Lives at home with wife; in [place]. retd from [employer] smoker; drinks liquor. Children - grown up.     FAMILY HISTORY: Family History  Adopted: Yes    ALLERGIES:  is allergic to azithromycin.  MEDICATIONS:  Current Outpatient Medications  Medication Sig Dispense Refill  . amoxicillin-clavulanate (AUGMENTIN) 875-125 MG tablet Take 1 tablet by mouth 2 (two) times daily.    Marland Kitchen allopurinol (ZYLOPRIM) 300 MG tablet Take 1 tablet (300 mg total) by mouth 2 (two) times daily. 120 tablet 0  . lidocaine-prilocaine (EMLA) cream Apply generously on the port 30-45 prior to accessing the port. 30 g 0  . ondansetron (ZOFRAN) 8 MG tablet One pill every 8 hours as needed for nausea/vomitting. 40 tablet 1  . predniSONE (DELTASONE) 50 MG tablet Take 2 tablets once day x 5 days. Take with food. 10 tablet 4  .  prochlorperazine (COMPAZINE) 10 MG tablet Take 1 tablet (10 mg total) by mouth every 6 (six) hours as needed for nausea or vomiting. 40 tablet 1   No current facility-administered medications for this visit.       Marland Kitchen  PHYSICAL EXAMINATION: ECOG PERFORMANCE STATUS: 1 - Symptomatic but completely ambulatory  Vitals:   07/11/19 1129  BP: 120/76  Pulse: (!) 105  Temp: 98.7 F (37.1 C)   Filed Weights   07/11/19 1129  Weight: 275 lb (124.7 kg)    Physical Exam  Constitutional: He is oriented to person, place, and time and well-developed, well-nourished, and in no distress.  HENT:  Head: Normocephalic and atraumatic.  Mouth/Throat: Oropharynx is clear and moist. No oropharyngeal exudate.  Eyes: Pupils are equal, round, and reactive to light.  Neck: Normal range of motion. Neck supple.  Bilateral bulky lymphadenopathy left  more than right.  Bilateral axillary adenopathy 1 to 2 cm in size.  Bilateral inguinal adenopathy 1 to 2 cm in size.  Cardiovascular: Normal rate and regular rhythm.  Pulmonary/Chest: Effort normal and breath sounds normal. No respiratory distress. He has no wheezes.  Abdominal: Soft. Bowel sounds are normal. He exhibits no distension and no mass. There is no abdominal tenderness. There is no rebound and no guarding.  Musculoskeletal: Normal range of motion.        General: No tenderness or edema.  Neurological: He is alert and oriented to person, place, and time.  Skin: Skin is warm.  Psychiatric: Affect normal.     LABORATORY DATA:  I have reviewed the data as listed Lab Results  Component Value Date   WBC 5.3 07/03/2019   HGB 9.1 (L) 07/03/2019   HCT 28.7 (L) 07/03/2019   MCV 97.3 07/03/2019   PLT 139 (L) 07/03/2019   Recent Labs    06/29/19 1018 07/03/19 1539  NA  --  138  K  --  4.3  CL  --  103  CO2  --  30  GLUCOSE  --  104*  BUN  --  28*  CREATININE 1.00 1.22  CALCIUM  --  9.6  GFRNONAA  --  >60  GFRAA  --  >60  PROT  --  6.9   ALBUMIN  --  3.8  AST  --  25  ALT  --  23  ALKPHOS  --  75  BILITOT  --  2.1*    RADIOGRAPHIC STUDIES: I have personally reviewed the radiological images as listed and agreed with the findings in the report. Ct Soft Tissue Neck W Contrast  Result Date: 06/29/2019 CLINICAL DATA:  Dysphagia and intermittent hoarseness over the last month. Smoking history. EXAM: CT NECK WITH CONTRAST TECHNIQUE: Multidetector CT imaging of the neck was performed using the standard protocol following the bolus administration of intravenous contrast. CONTRAST:  24m OMNIPAQUE IOHEXOL 300 MG/ML  SOLN COMPARISON:  None. FINDINGS: Pharynx and larynx: There is a 6 x 4 x 4 cm mass at the tongue base/vallecula, flattening the epiglottis inferiorly and encroaching markedly upon the oropharynx. This appears fairly well circumscribed and has homogeneous density. Massive bilateral neck adenopathy left worse than right described below without necrosis leads me to favor the diagnosis of lymphoma, with this large oropharyngeal mass probably representing lymphoma within the lingual tonsillar tissue, more likely than a primary squamous cell carcinoma. Salivary glands: Parotid and submandibular glands are intrinsically normal. Slight prominence intraparotid nodes. Thyroid: Normal Lymph nodes: Massive bilateral neck lymphadenopathy. Largest node on the right is a level 2 node with diameter of 2.6 cm. Massive nodal confluence at level 2 on the left measures 6.4 x 5.1 cm axial image 50. Adenopathy in the lower neck is much more bulky on the left than the right including extensive supraclavicular lymphadenopathy, again left more than right. None of these nodes show necrosis. The pattern favors lymphoma over metastatic disease. Smaller nodes are evident in the axillary regions and superior mediastinum, probably also involved. Vascular: Normal Limited intracranial: Normal Visualized orbits: Normal Mastoids and visualized paranasal sinuses:  Mucosal thickening and or cysts/polyps in the left maxillary sinus. Lesser mucosal thickening in the right maxillary sinus. Scattered opacified ethmoid air cells. Skeleton: Degenerative cervical spondylosis. Upper chest: No lung pathology. Small pathologic superior mediastinal nodes as discussed above. Other: None IMPRESSION: Massive lymphadenopathy pattern more extensive on the left than the right. 6 x 4 x  4 cm mass at the tongue base/vallecula with displacement of the epiglottis and marked encroachment upon the oropharynx. In this setting, this is felt to be lymphoid tissue involvement by the same process, probably malignant lymphoma, rather than a primary squamous cell carcinoma of the tongue base. Density of this mass and of the lymph nodes is homogeneous and there are no necrotic nodes. See above for full discussion. Lesser involvement of the nodes in the superior mediastinum axillary regions. Electronically Signed   By: Nelson Chimes M.D.   On: 06/29/2019 13:02   Nm Pet Image Initial (pi) Skull Base To Thigh  Result Date: 07/10/2019 CLINICAL DATA:  Initial treatment strategy for lymphoma. EXAM: NUCLEAR MEDICINE PET SKULL BASE TO THIGH TECHNIQUE: 14.5 mCi F-18 FDG was injected intravenously. Full-ring PET imaging was performed from the skull base to thigh after the radiotracer. CT data was obtained and used for attenuation correction and anatomic localization. Fasting blood glucose: 91 mg/dl COMPARISON:  CT neck 06/29/2019 FINDINGS: Mediastinal blood pool activity: SUV max 2.75 Liver activity: SUV max NA NECK: Massively enlarged and intensely hypermetabolic bilateral cervical lymph nodes involving the level II through level IV lymph node stations as well as the level 1 nodes bilaterally. Example massive lymph node complex in the LEFT level II nodal station measures 3.7 cm short axis SUV max equal 25.1. There is asymmetric soft tissue thickening in the posterior oropharynx and hypopharynx which crosses midline  but appears to originate on the LEFT with SUV max equal 31.9. This mass encroaches upon the oropharynx and measures 3.6 cm. Incidental CT findings: Soft tissue mass in the posterior oropharynx and hypopharynx as above. CHEST: Intensely hypermetabolic mass of enlarged hypermetabolic LEFT and RIGHT supraclavicular nodes. LEFT supraclavicular nodes with SUV max equal 24 elevate the musculature. Hypermetabolic adenopathy involves the bilateral mediastinal nodes and bilateral axillary nodes. Incidental CT findings: No pulmonary nodularity ABDOMEN/PELVIS: Spleen is enlarged and uniformly hypermetabolic measuring 18 cm in craniocaudad dimension with SUV max equal 7.1. No abnormal activity liver. There are hypermetabolic periportal lymph nodes. Minimal periaortic retroperitoneal nodes. There are small lymph nodes along the iliac chains and operator node spaces. Hypermetabolic lymph nodes in the inguinal nodal station. Minimally enlarged nodes are in the LEFT inguinal space with SUV max equal 11.6 are mildly enlarged at 12 mm. Testicles have normal metabolic activity. Incidental CT findings: None SKELETON: There is moderate diffuse metabolic activity within the vertebral bodies of the thoracic lumbar spine with SUV max equal 7.1. Diffuse activity within the iliac bones and sacrum with SUV max equal 5.2. Incidental CT findings: none IMPRESSION: 1. Bulky hypermetabolic lymphadenopathy involving the cervical lymph nodes, mediastinal nodes, axillary nodes, periportal nodes and inguinal nodes. Findings consistent with high-grade lymphoma. Dominant adenopathy is within the neck and upper chest. 2. Bulky hypermetabolic mass in the posterior oropharynx and hypopharynx. 3. Enlarged and uniform HYPERMETABOLIC SPLEEN consistent with lymphoma involvement. 4. Diffuse moderately hypermetabolic activity marrow space. Differential include lymphoma involvement of the marrow versus marrow hyperplasia from anemia. Electronically Signed   By:  Suzy Bouchard M.D.   On: 07/10/2019 15:37   [place] Core Biopsy (lymph Nodes)  Result Date: 07/04/2019 INDICATION: Multiple cervical lymph nodes with known base of tongue mass EXAM: ULTRASOUND-GUIDED BIOPSY OF LEFT CERVICAL LYMPH NODE JUST BELOW THE MANDIBLE MEDICATIONS: None. ANESTHESIA/SEDATION: None FLUOROSCOPY TIME:  Not applicable COMPLICATIONS: None immediate. PROCEDURE: Informed written consent was obtained from the patient after a thorough discussion of the procedural risks, benefits and alternatives. All questions were addressed.  Maximal Sterile Barrier Technique was utilized including caps, mask, sterile gowns, sterile gloves, sterile drape, hand hygiene and skin antiseptic. A timeout was performed prior to the initiation of the procedure. Utilizing 1% lidocaine as local anesthetic and real-time ultrasound guidance, a 17 gauge guiding needle was placed percutaneously into the large submandibular cervical lymph node. Four 18 gauge core biopsies were then obtained and sent on moistened Telfa pad for pathologic evaluation. The guiding needle was then removed and hemostasis obtained at the puncture site. The patient tolerated the procedure well was returned his room in satisfactory condition. IMPRESSION: Successful ultrasound-guided lymph node biopsy in the left neck. Electronically Signed   By: Inez Catalina M.D.   On: 07/04/2019 12:24    ASSESSMENT & PLAN:   Lymphoma of lymph nodes ([facility]) #Diffuse large B-cell lymphoma-ABC subtype; c-myc staining/FISH studies pending.  Based on PET scan; stage IV [involvement of the spleen]; hold off bone marrow biopsy.  #  Discussed mechanism of action of rituximab;and also potential side effects including but not limited to infusion reactions;rare infections/activation including hepatitis.  Hepatitis work-up negative.   # Discussed R-CHOP chemotherapy every 3 weeks x 6 cycles.  Discussed therapies cure. I would do interim scan after 2-3 cycles of chemo.  I  discussed at length regarding the individual drugs in R-CHOP. Discussed the potential side effects including but not limited to-increasing fatigue, nausea vomiting, diarrhea, hair loss, sores in the mouth, increase risk of infection and also neuropathy. Also discussed regarding potential side effects of heart failure/leukemia with Adriamycin.  2D echo-pending.  Growth factor would be given as prophylaxis for chemotherapy-induced neutropenia to prevent febrile neutropenias. Discussed potential side effect- myalgias/arthralgias.   #Recommend starting therapy with Rituxan prednisone ASAP 9/11.  R CHOP chemotherapy will be started hopefully in 1 week after port and 2D echo.  # # Chemotherapy education; port placement.  Antiemetics-Zofran and Compazine; EMLA cream sent to pharmacy.   # DISPOSITION: # 9/11- labs- cbc/bmp/ldh; Ritixan # covid-19 today # 2 d echo/ mediport/ ASAP/ chemo class- at 9 tomorrow # 9/18- MD-labs-cbc/bmp/ldh- Freddy Finner; udenyca 9/21 -Dr.B  # I reviewed the blood work- with the patient in detail; also reviewed the imaging independently [as summarized above]; and with the patient in detail.  Also discussed with the patient's wife over the phone.  # 40 minutes face-to-face with the patient discussing the above plan of care; more than 50% of time spent on prognosis/ natural history; counseling and coordination.  All questions were answered. The patient knows to call the clinic with any problems, questions or concerns.    Cammie Sickle, MD 07/11/2019 12:53 PM

## 2019-07-12 ENCOUNTER — Other Ambulatory Visit: Payer: Self-pay | Admitting: Pathology

## 2019-07-12 ENCOUNTER — Inpatient Hospital Stay: Payer: Medicare Other

## 2019-07-12 ENCOUNTER — Other Ambulatory Visit: Payer: Self-pay

## 2019-07-12 ENCOUNTER — Encounter: Payer: Self-pay | Admitting: Internal Medicine

## 2019-07-12 LAB — SARS CORONAVIRUS 2 (TAT 6-24 HRS): SARS Coronavirus 2: NEGATIVE

## 2019-07-13 ENCOUNTER — Telehealth: Payer: Self-pay | Admitting: *Deleted

## 2019-07-13 ENCOUNTER — Encounter: Payer: Self-pay | Admitting: Internal Medicine

## 2019-07-13 ENCOUNTER — Inpatient Hospital Stay: Payer: Medicare Other

## 2019-07-13 ENCOUNTER — Other Ambulatory Visit: Payer: Self-pay

## 2019-07-13 ENCOUNTER — Other Ambulatory Visit: Payer: Self-pay | Admitting: Internal Medicine

## 2019-07-13 ENCOUNTER — Other Ambulatory Visit: Admission: RE | Admit: 2019-07-13 | Payer: Medicare Other | Source: Ambulatory Visit

## 2019-07-13 VITALS — BP 114/67 | HR 77 | Temp 96.7°F | Resp 20 | Wt 275.0 lb

## 2019-07-13 DIAGNOSIS — Z5112 Encounter for antineoplastic immunotherapy: Secondary | ICD-10-CM | POA: Diagnosis not present

## 2019-07-13 DIAGNOSIS — C8590 Non-Hodgkin lymphoma, unspecified, unspecified site: Secondary | ICD-10-CM

## 2019-07-13 DIAGNOSIS — R590 Localized enlarged lymph nodes: Secondary | ICD-10-CM | POA: Diagnosis not present

## 2019-07-13 DIAGNOSIS — Z5111 Encounter for antineoplastic chemotherapy: Secondary | ICD-10-CM | POA: Diagnosis not present

## 2019-07-13 DIAGNOSIS — Z5189 Encounter for other specified aftercare: Secondary | ICD-10-CM | POA: Diagnosis not present

## 2019-07-13 DIAGNOSIS — C8338 Diffuse large B-cell lymphoma, lymph nodes of multiple sites: Secondary | ICD-10-CM | POA: Diagnosis not present

## 2019-07-13 LAB — CBC WITH DIFFERENTIAL/PLATELET
Abs Immature Granulocytes: 0.25 10*3/uL — ABNORMAL HIGH (ref 0.00–0.07)
Basophils Absolute: 0 10*3/uL (ref 0.0–0.1)
Basophils Relative: 0 %
Eosinophils Absolute: 0 10*3/uL (ref 0.0–0.5)
Eosinophils Relative: 0 %
HCT: 30.2 % — ABNORMAL LOW (ref 39.0–52.0)
Hemoglobin: 9.6 g/dL — ABNORMAL LOW (ref 13.0–17.0)
Immature Granulocytes: 3 %
Lymphocytes Relative: 25 %
Lymphs Abs: 2.3 10*3/uL (ref 0.7–4.0)
MCH: 31.6 pg (ref 26.0–34.0)
MCHC: 31.8 g/dL (ref 30.0–36.0)
MCV: 99.3 fL (ref 80.0–100.0)
Monocytes Absolute: 0.8 10*3/uL (ref 0.1–1.0)
Monocytes Relative: 9 %
Neutro Abs: 5.8 10*3/uL (ref 1.7–7.7)
Neutrophils Relative %: 63 %
Platelets: 138 10*3/uL — ABNORMAL LOW (ref 150–400)
RBC: 3.04 MIL/uL — ABNORMAL LOW (ref 4.22–5.81)
RDW: 18.4 % — ABNORMAL HIGH (ref 11.5–15.5)
WBC: 9.2 10*3/uL (ref 4.0–10.5)
nRBC: 0 % (ref 0.0–0.2)

## 2019-07-13 LAB — BASIC METABOLIC PANEL
Anion gap: 10 (ref 5–15)
BUN: 29 mg/dL — ABNORMAL HIGH (ref 8–23)
CO2: 26 mmol/L (ref 22–32)
Calcium: 9.2 mg/dL (ref 8.9–10.3)
Chloride: 103 mmol/L (ref 98–111)
Creatinine, Ser: 1.05 mg/dL (ref 0.61–1.24)
GFR calc Af Amer: >60 mL/min (ref >60)
GFR calc non Af Amer: >60 mL/min (ref >60)
Glucose, Bld: 158 mg/dL — ABNORMAL HIGH (ref 70–99)
Potassium: 3.9 mmol/L (ref 3.5–5.1)
Sodium: 139 mmol/L (ref 135–145)

## 2019-07-13 LAB — SURGICAL PATHOLOGY

## 2019-07-13 LAB — LACTATE DEHYDROGENASE: LDH: 198 U/L — ABNORMAL HIGH (ref 98–192)

## 2019-07-13 MED ORDER — SODIUM CHLORIDE 0.9 % IV SOLN
Freq: Once | INTRAVENOUS | Status: AC
  Administered 2019-07-13: 10:00:00 via INTRAVENOUS
  Filled 2019-07-13: qty 1.2

## 2019-07-13 MED ORDER — DIPHENHYDRAMINE HCL 25 MG PO CAPS
50.0000 mg | ORAL_CAPSULE | Freq: Once | ORAL | Status: AC
  Administered 2019-07-13: 50 mg via ORAL
  Filled 2019-07-13: qty 2

## 2019-07-13 MED ORDER — SODIUM CHLORIDE 0.9 % IV SOLN
Freq: Once | INTRAVENOUS | Status: AC
  Administered 2019-07-13: 10:00:00 via INTRAVENOUS
  Filled 2019-07-13: qty 250

## 2019-07-13 MED ORDER — ACETAMINOPHEN 325 MG PO TABS
650.0000 mg | ORAL_TABLET | Freq: Once | ORAL | Status: AC
  Administered 2019-07-13: 650 mg via ORAL
  Filled 2019-07-13: qty 2

## 2019-07-13 MED ORDER — SODIUM CHLORIDE 0.9 % IV SOLN
375.0000 mg/m2 | Freq: Once | INTRAVENOUS | Status: AC
  Administered 2019-07-13: 11:00:00 900 mg via INTRAVENOUS
  Filled 2019-07-13: qty 50

## 2019-07-13 NOTE — Progress Notes (Signed)
AST/ALT not drawn today. Per Dr Rogue Bussing okay to proceed with Rituxan today

## 2019-07-13 NOTE — Telephone Encounter (Signed)
Per patient he request that the start date for his FMLA is Monday 07/16/2019

## 2019-07-15 NOTE — Telephone Encounter (Signed)
Patients forms have been completed, I will scan to Heather S on Monday 9/14 to be signed and faxed. Thanks.

## 2019-07-16 ENCOUNTER — Other Ambulatory Visit: Payer: Self-pay

## 2019-07-16 ENCOUNTER — Encounter: Payer: Self-pay | Admitting: *Deleted

## 2019-07-16 ENCOUNTER — Ambulatory Visit
Admission: RE | Admit: 2019-07-16 | Discharge: 2019-07-16 | Disposition: A | Discharge status: Home / Self Care | Payer: Medicare Other | Source: Ambulatory Visit | Attending: Internal Medicine | Admitting: Internal Medicine

## 2019-07-16 ENCOUNTER — Telehealth: Payer: Self-pay

## 2019-07-16 DIAGNOSIS — Z5111 Encounter for antineoplastic chemotherapy: Secondary | ICD-10-CM | POA: Insufficient documentation

## 2019-07-16 DIAGNOSIS — C8590 Non-Hodgkin lymphoma, unspecified, unspecified site: Secondary | ICD-10-CM

## 2019-07-16 DIAGNOSIS — Z01810 Encounter for preprocedural cardiovascular examination: Secondary | ICD-10-CM | POA: Diagnosis not present

## 2019-07-16 MED ORDER — PERFLUTREN LIPID MICROSPHERE
1.0000 mL | INTRAVENOUS | Status: AC | PRN
  Administered 2019-07-16: 6 mL via INTRAVENOUS
  Filled 2019-07-16: qty 10

## 2019-07-16 NOTE — Progress Notes (Signed)
*  PRELIMINARY RESULTS* Echocardiogram 2D Echocardiogram has been performed. Definity IV Contrast used on this study.  Eric [name] Eric [name] 07/16/2019, 7:05 PM

## 2019-07-16 NOTE — Telephone Encounter (Signed)
Telephone call to patient for follow up visit after 1st treatment.   Patient states a decrease in the swelling of his neck and feeling great.  States sleeping better and speaking easily with decreased swelling.  Thanked me for calling and states looking forward to next treatment.

## 2019-07-16 NOTE — Patient Instructions (Signed)
FMLA forms completed for patient, scanned to clinic staff for MD signature and to be faxed.

## 2019-07-17 ENCOUNTER — Other Ambulatory Visit: Payer: Self-pay | Admitting: Radiology

## 2019-07-18 ENCOUNTER — Other Ambulatory Visit: Payer: Self-pay

## 2019-07-18 ENCOUNTER — Ambulatory Visit
Admission: RE | Admit: 2019-07-18 | Discharge: 2019-07-18 | Disposition: A | Discharge status: Home / Self Care | Payer: Medicare Other | Source: Ambulatory Visit | Attending: Internal Medicine | Admitting: Internal Medicine

## 2019-07-18 DIAGNOSIS — Z5111 Encounter for antineoplastic chemotherapy: Secondary | ICD-10-CM

## 2019-07-18 DIAGNOSIS — Z881 Allergy status to other antibiotic agents status: Secondary | ICD-10-CM | POA: Insufficient documentation

## 2019-07-18 DIAGNOSIS — F17210 Nicotine dependence, cigarettes, uncomplicated: Secondary | ICD-10-CM | POA: Diagnosis not present

## 2019-07-18 DIAGNOSIS — Z79899 Other long term (current) drug therapy: Secondary | ICD-10-CM | POA: Insufficient documentation

## 2019-07-18 DIAGNOSIS — C8590 Non-Hodgkin lymphoma, unspecified, unspecified site: Secondary | ICD-10-CM | POA: Diagnosis not present

## 2019-07-18 DIAGNOSIS — C8330 Diffuse large B-cell lymphoma, unspecified site: Secondary | ICD-10-CM | POA: Diagnosis not present

## 2019-07-18 HISTORY — PX: IR IMAGING GUIDED PORT INSERTION: IMG5740

## 2019-07-18 LAB — BASIC METABOLIC PANEL
Anion gap: 8 (ref 5–15)
BUN: 35 mg/dL — ABNORMAL HIGH (ref 8–23)
CO2: 28 mmol/L (ref 22–32)
Calcium: 9 mg/dL (ref 8.9–10.3)
Chloride: 101 mmol/L (ref 98–111)
Creatinine, Ser: 0.99 mg/dL (ref 0.61–1.24)
GFR calc Af Amer: >60 mL/min (ref >60)
GFR calc non Af Amer: >60 mL/min (ref >60)
Glucose, Bld: 96 mg/dL (ref 70–99)
Potassium: 4.1 mmol/L (ref 3.5–5.1)
Sodium: 137 mmol/L (ref 135–145)

## 2019-07-18 LAB — CBC
HCT: 35.1 % — ABNORMAL LOW (ref 39.0–52.0)
Hemoglobin: 11.2 g/dL — ABNORMAL LOW (ref 13.0–17.0)
MCH: 31.5 pg (ref 26.0–34.0)
MCHC: 31.9 g/dL (ref 30.0–36.0)
MCV: 98.6 fL (ref 80.0–100.0)
Platelets: 217 10*3/uL (ref 150–400)
RBC: 3.56 MIL/uL — ABNORMAL LOW (ref 4.22–5.81)
RDW: 18.6 % — ABNORMAL HIGH (ref 11.5–15.5)
WBC: 8.8 10*3/uL (ref 4.0–10.5)
nRBC: 0 % (ref 0.0–0.2)

## 2019-07-18 LAB — PROTIME-INR
INR: 1.1 (ref 0.8–1.2)
Prothrombin Time: 14.2 seconds (ref 11.4–15.2)

## 2019-07-18 MED ORDER — FENTANYL CITRATE (PF) 100 MCG/2ML IJ SOLN
INTRAMUSCULAR | Status: AC
  Filled 2019-07-18: qty 2

## 2019-07-18 MED ORDER — CEFAZOLIN SODIUM-DEXTROSE 2-4 GM/100ML-% IV SOLN: 2.0000 g | INTRAVENOUS | Status: DC

## 2019-07-18 MED ORDER — MIDAZOLAM HCL 5 MG/5ML IJ SOLN
INTRAMUSCULAR | Status: AC
  Filled 2019-07-18: qty 5

## 2019-07-18 MED ORDER — CEFAZOLIN SODIUM-DEXTROSE 2-4 GM/100ML-% IV SOLN
INTRAVENOUS | Status: AC
  Filled 2019-07-18: qty 100

## 2019-07-18 MED ORDER — FENTANYL CITRATE (PF) 100 MCG/2ML IJ SOLN
INTRAMUSCULAR | Status: AC | PRN
  Administered 2019-07-18 (×2): 50 ug via INTRAVENOUS

## 2019-07-18 MED ORDER — SODIUM CHLORIDE 0.9 % IV SOLN
INTRAVENOUS | Status: DC
  Administered 2019-07-18: 14:00:00 via INTRAVENOUS

## 2019-07-18 MED ORDER — MIDAZOLAM HCL 2 MG/2ML IJ SOLN
INTRAMUSCULAR | Status: AC | PRN
  Administered 2019-07-18 (×2): 1 mg via INTRAVENOUS

## 2019-07-18 NOTE — Procedures (Signed)
Interventional Radiology Procedure Note  Procedure: Single Lumen Power Port Placement    Access:  Right IJ vein.  Findings: Catheter tip positioned at SVC/RA junction. Port is ready for immediate use.   Complications: None  EBL: < 10 mL  Recommendations:  - Ok to shower in 24 hours - Do not submerge for 7 days - Routine line care   Nycole Kawahara T. Breezy Hertenstein, M.D Pager:  [phone]   

## 2019-07-19 ENCOUNTER — Other Ambulatory Visit: Payer: Self-pay | Admitting: *Deleted

## 2019-07-19 DIAGNOSIS — C8590 Non-Hodgkin lymphoma, unspecified, unspecified site: Secondary | ICD-10-CM

## 2019-07-20 ENCOUNTER — Inpatient Hospital Stay (HOSPITAL_BASED_OUTPATIENT_CLINIC_OR_DEPARTMENT_OTHER): Payer: Medicare Other | Admitting: Internal Medicine

## 2019-07-20 ENCOUNTER — Inpatient Hospital Stay: Payer: Medicare Other | Admitting: *Deleted

## 2019-07-20 ENCOUNTER — Other Ambulatory Visit: Payer: Self-pay

## 2019-07-20 ENCOUNTER — Encounter: Payer: Self-pay | Admitting: Internal Medicine

## 2019-07-20 ENCOUNTER — Inpatient Hospital Stay: Payer: Medicare Other

## 2019-07-20 VITALS — BP 115/54 | HR 70 | Temp 98.0°F | Resp 18

## 2019-07-20 DIAGNOSIS — Z5111 Encounter for antineoplastic chemotherapy: Secondary | ICD-10-CM | POA: Diagnosis not present

## 2019-07-20 DIAGNOSIS — Z95828 Presence of other vascular implants and grafts: Secondary | ICD-10-CM

## 2019-07-20 DIAGNOSIS — C8590 Non-Hodgkin lymphoma, unspecified, unspecified site: Secondary | ICD-10-CM

## 2019-07-20 DIAGNOSIS — Z5112 Encounter for antineoplastic immunotherapy: Secondary | ICD-10-CM | POA: Diagnosis not present

## 2019-07-20 DIAGNOSIS — R590 Localized enlarged lymph nodes: Secondary | ICD-10-CM | POA: Diagnosis not present

## 2019-07-20 DIAGNOSIS — C8338 Diffuse large B-cell lymphoma, lymph nodes of multiple sites: Secondary | ICD-10-CM | POA: Diagnosis not present

## 2019-07-20 DIAGNOSIS — Z5189 Encounter for other specified aftercare: Secondary | ICD-10-CM | POA: Diagnosis not present

## 2019-07-20 LAB — CBC WITH DIFFERENTIAL/PLATELET
Abs Immature Granulocytes: 0.12 10*3/uL — ABNORMAL HIGH (ref 0.00–0.07)
Basophils Absolute: 0 10*3/uL (ref 0.0–0.1)
Basophils Relative: 0 %
Eosinophils Absolute: 0.1 10*3/uL (ref 0.0–0.5)
Eosinophils Relative: 2 %
HCT: 33.7 % — ABNORMAL LOW (ref 39.0–52.0)
Hemoglobin: 10.9 g/dL — ABNORMAL LOW (ref 13.0–17.0)
Immature Granulocytes: 2 %
Lymphocytes Relative: 10 %
Lymphs Abs: 0.7 10*3/uL (ref 0.7–4.0)
MCH: 31.7 pg (ref 26.0–34.0)
MCHC: 32.3 g/dL (ref 30.0–36.0)
MCV: 98 fL (ref 80.0–100.0)
Monocytes Absolute: 0.6 10*3/uL (ref 0.1–1.0)
Monocytes Relative: 9 %
Neutro Abs: 5.2 10*3/uL (ref 1.7–7.7)
Neutrophils Relative %: 77 %
Platelets: 158 10*3/uL (ref 150–400)
RBC: 3.44 MIL/uL — ABNORMAL LOW (ref 4.22–5.81)
RDW: 18.4 % — ABNORMAL HIGH (ref 11.5–15.5)
WBC: 6.7 10*3/uL (ref 4.0–10.5)
nRBC: 0 % (ref 0.0–0.2)

## 2019-07-20 LAB — BASIC METABOLIC PANEL
Anion gap: 9 (ref 5–15)
BUN: 27 mg/dL — ABNORMAL HIGH (ref 8–23)
CO2: 27 mmol/L (ref 22–32)
Calcium: 8.9 mg/dL (ref 8.9–10.3)
Chloride: 99 mmol/L (ref 98–111)
Creatinine, Ser: 1.07 mg/dL (ref 0.61–1.24)
GFR calc Af Amer: >60 mL/min (ref >60)
GFR calc non Af Amer: >60 mL/min (ref >60)
Glucose, Bld: 122 mg/dL — ABNORMAL HIGH (ref 70–99)
Potassium: 4.2 mmol/L (ref 3.5–5.1)
Sodium: 135 mmol/L (ref 135–145)

## 2019-07-20 LAB — LACTATE DEHYDROGENASE: LDH: 201 U/L — ABNORMAL HIGH (ref 98–192)

## 2019-07-20 MED ORDER — HEPARIN SOD (PORK) LOCK FLUSH 100 UNIT/ML IV SOLN
500.0000 [IU] | Freq: Once | INTRAVENOUS | Status: AC | PRN
  Administered 2019-07-20: 500 [IU]
  Filled 2019-07-20: qty 5

## 2019-07-20 MED ORDER — SODIUM CHLORIDE 0.9 % IV SOLN
375.0000 mg/m2 | Freq: Once | INTRAVENOUS | Status: AC
  Administered 2019-07-20: 900 mg via INTRAVENOUS
  Filled 2019-07-20: qty 50

## 2019-07-20 MED ORDER — SODIUM CHLORIDE 0.9 % IV SOLN
Freq: Once | INTRAVENOUS | Status: AC
  Administered 2019-07-20: 10:00:00 via INTRAVENOUS
  Filled 2019-07-20: qty 5

## 2019-07-20 MED ORDER — SODIUM CHLORIDE 0.9% FLUSH
10.0000 mL | Freq: Once | INTRAVENOUS | Status: AC
  Administered 2019-07-20: 10 mL via INTRAVENOUS
  Filled 2019-07-20: qty 10

## 2019-07-20 MED ORDER — ACETAMINOPHEN 325 MG PO TABS
650.0000 mg | ORAL_TABLET | Freq: Once | ORAL | Status: AC
  Administered 2019-07-20: 650 mg via ORAL
  Filled 2019-07-20: qty 2

## 2019-07-20 MED ORDER — SODIUM CHLORIDE 0.9 % IV SOLN
Freq: Once | INTRAVENOUS | Status: DC | PRN
  Administered 2019-07-20: 14:00:00 via INTRAVENOUS
  Filled 2019-07-20: qty 250

## 2019-07-20 MED ORDER — PALONOSETRON HCL INJECTION 0.25 MG/5ML
0.2500 mg | Freq: Once | INTRAVENOUS | Status: AC
  Administered 2019-07-20: 10:00:00 0.25 mg via INTRAVENOUS
  Filled 2019-07-20: qty 5

## 2019-07-20 MED ORDER — SODIUM CHLORIDE 0.9 % IV SOLN
750.0000 mg/m2 | Freq: Once | INTRAVENOUS | Status: AC
  Administered 2019-07-20: 1900 mg via INTRAVENOUS
  Filled 2019-07-20: qty 95

## 2019-07-20 MED ORDER — DIPHENHYDRAMINE HCL 25 MG PO CAPS
50.0000 mg | ORAL_CAPSULE | Freq: Once | ORAL | Status: AC
  Administered 2019-07-20: 50 mg via ORAL
  Filled 2019-07-20: qty 2

## 2019-07-20 MED ORDER — SODIUM CHLORIDE 0.9 % IV SOLN
Freq: Once | INTRAVENOUS | Status: AC
  Administered 2019-07-20: 10:00:00 via INTRAVENOUS
  Filled 2019-07-20: qty 250

## 2019-07-20 MED ORDER — VINCRISTINE SULFATE CHEMO INJECTION 1 MG/ML
2.0000 mg | Freq: Once | INTRAVENOUS | Status: AC
  Administered 2019-07-20: 2 mg via INTRAVENOUS
  Filled 2019-07-20: qty 2

## 2019-07-20 NOTE — Progress Notes (Signed)
fmla papers completed and hand delivered to patient. He stated that he would personally take these fmla papers to his employment.

## 2019-07-20 NOTE — Progress Notes (Signed)
Verbal order from MD to continue with treatment today even though bilirubin and ALT/AST labs were not drawn today. Pharmacist was also notified.   Clarinda Obi [employer]

## 2019-07-20 NOTE — Assessment & Plan Note (Addendum)
#  Diffuse large B-cell lymphoma-ABC subtype;  Based on PET scan; stage IV [involvement of the spleen]; s/p Rituxan-Prednisone x1.  # proceed with R-COP; Labs today reviewed;  acceptable for treatment today. HOLD adriamycin sec to borderline LOW EF of 45-50% [see below].  Given the borderline low ejection fraction-I would substitute Adriamycin for etoposide for now.   #Borderline low ejection fraction/diffuse left ventricular hypokinesis.  Patient has no prior history of cardiac disease.  Recommend evaluation with cardiology.  Will refer to Dr. Fletcher Anon.   #Difficulty sleeping at night/anxiety attack-bulky neck adenopathy-suspect this will improve with the treatment helps with the lymphoma.  Growth factor-udenyca would be given as prophylaxis for chemotherapy-induced neutropenia to prevent febrile neutropenias. Discussed potential side effect- myalgias/arthralgias- recommend Claritin for 4 days.   # DISPOSITION: # Chemo today; but HELD Adriamcyin today.  # udenyca on 9/21 # referral to [facility]- ASAP re: cardiomyopathy/ planning chemo # 10/02- MD-labs-cbc/Cmp/ldh- RCEOP; day-2 etop/d-3-Etop; d-4; udenyca- Dr.B  # 25 minutes face-to-face with the patient discussing the above plan of care; more than 50% of time spent on prognosis/ natural history; counseling and coordination.

## 2019-07-20 NOTE — Progress Notes (Signed)
Pt received vincristine, cytoxan, and ruxience today for treatment. All premedications were given prior to the start of chemotherapy treatment- see MAR for details.   At 1330 when RN was about to deaccess patient, pt stated "I feel a little flushed and clammy." pt did not appear to be in any distress. vital signs were check and were BP-105/66 P-65 T-98.7 O2-97 on RA.Nikodem Leadbetter NP was notified. Pt is A/O X4, no complaints of any SOB, no cough, and does not have any itching sensation at this time.  At 13 Nayla Dias NP at bedside to assess pt. Vital signs were rechecked- BP-100/62 P-63, O2-98 on RA. Pt A/O X4. Verbal order to start 1L of NS blous and to recheck vitals once pt has received 500 mL.   At 1410 pt has received 596mL of NS and vitals were rechecked- BP 104/68, P-66, O2-98 on RA, T-97.5. pt is A/O X4 and states "he is feeling back to normal and does not feel flushed anymore" pt does not appear to be in any distress and has no complaints. Donzell Coller NP was notified of updated vitals. Orders to continue with the 1Liter bolus and check vitals after he has received the liter.   At 1440 1Liter bolus has been completed. Vital signs were checked- BP 115/54, P-70, O2-98, T-98. Pt states "I feel back to normal now and I do not feel flushed/clammy" Joniya Boberg NP was notified of recent vital signs and states "pt is stable for discharge and if he starts to feel like this again at home to take a benadryl and go to the emergency room." pt was updated and verbalizes understanding. Pt does not seem to be in any distress, A/O X4, VSS, pt is stable for discharge. Pt discharged home at 1500.   Aviella Disbrow [employer]

## 2019-07-20 NOTE — Progress Notes (Signed)
[place] NOTE  Patient Care Team: [name], MD as PCP - General (Family Medicine) [name] [name], MD (General Surgery)  CHIEF COMPLAINTS/PURPOSE OF CONSULTATION: Diffuse large B-cell lymphoma  #  Oncology History Overview Note  #June 29, 2019-CT- bulky bilateral neck adenopathy left more than right; September 2020-Diffuse B-cell lymphoma; ABC subtype; NEGATIVE FISH studies.  PET scan -bulky adenopathy involving neck /lymphadenopathy mediastinum /abdomen/pelvis inguinal lymph nodes; splenic involvement.  Question bone.  Stage IV [spleen involvement; no bone marrow biopsy].  BCL-2: Positive /BCL 6: Positive /MUM-1: Positive /CD10: Negative 'Ki-67: High proliferation index (90%) /CD30: Negative /Cyclin D1: Negative C-MYC: Equivocal (20% staining seen on limited and fragmented tissue) /EBER: Negative   # Ritux-Pred- 9/11; 9/18- R-COP [held adria- sec to EF 45-50%]  # Sep 2020- Borderline low EF/Left ventricular hypokinesis [refer to [facility]  # DIAGNOSIS: DLBCL  STAGE:   4      ;GOALS: Cure  CURRENT/MOST RECENT THERAPY : R-COP/-R-CEOP.     Lymphoma of lymph nodes ([place])  07/03/2019 Initial Diagnosis   Lymphoma of lymph nodes ([place])   07/13/2019 -  Chemotherapy   The patient had DOXOrubicin (ADRIAMYCIN) chemo injection 126 mg, 50 mg/m2 = 126 mg, Intravenous,  Once, 0 of 5 cycles palonosetron (ALOXI) injection 0.25 mg, 0.25 mg, Intravenous,  Once, 1 of 6 cycles Administration: 0.25 mg (07/20/2019) pegfilgrastim-cbqv (UDENYCA) injection 6 mg, 6 mg, Subcutaneous, Once, 1 of 6 cycles vinCRIStine (ONCOVIN) 2 mg in sodium chloride 0.9 % 50 mL chemo infusion, 2 mg, Intravenous,  Once, 1 of 6 cycles Administration: 2 mg (07/20/2019) cyclophosphamide (CYTOXAN) 1,900 mg in sodium chloride 0.9 % 250 mL chemo infusion, 750 mg/m2 = 1,900 mg, Intravenous,  Once, 1 of 6 cycles Administration: 1,900 mg (07/20/2019) dexamethasone (DECADRON) 12 mg in sodium chloride 0.9  % 145 mL IVPB, , Intravenous,  Once, 2 of 7 cycles Administration:  (07/13/2019),  (07/20/2019) riTUXimab-pvvr (RUXIENCE) 900 mg in sodium chloride 0.9 % 250 mL (2.6471 mg/mL) infusion, 375 mg/m2 = 900 mg (100 % of original dose 375 mg/m2), Intravenous,  Once, 1 of 1 cycle Dose modification: 375 mg/m2 (original dose 375 mg/m2, Cycle 1) Administration: 900 mg (07/13/2019)  for chemotherapy treatment.      HISTORY OF PRESENTING ILLNESS:  Eric [name] 68 y.o.  male with recently due for lab B-cell lymphoma is currently status post prednisone Rituxan approximately week ago is here for follow-up/proceed with chemotherapy.  Denies any of his neck lymph nodes getting bigger.  Feels may be slightly smaller.  Did not have any infusion reactions with Rituxan last visit.   Patient denies any fevers or chills.  Continues to have difficulty breathing at night/waking up in the middle of the night try to catch breath.  He also notes to have "itch" with any obvious rash noted especially nighttime.    His appetite is good.  No nausea no vomiting.  Denies any chest pain.  Review of Systems  Constitutional: Positive for malaise/fatigue and weight loss. Negative for chills, diaphoresis and fever.  HENT: Negative for nosebleeds and sore throat.   Eyes: Negative for double vision.  Respiratory: Positive for cough and shortness of breath. Negative for hemoptysis, sputum production and wheezing.   Cardiovascular: Negative for chest pain, palpitations, orthopnea and leg swelling.  Gastrointestinal: Negative for abdominal pain, blood in stool, constipation, diarrhea, heartburn, melena, nausea and vomiting.  Genitourinary: Negative for dysuria, frequency and urgency.  Musculoskeletal: Negative for back pain and joint pain.  Skin: Negative.  Negative for itching and rash.  Neurological: Negative for dizziness, tingling, focal weakness, weakness and headaches.  Endo/Heme/Allergies: Does not bruise/bleed easily.   Psychiatric/Behavioral: Negative for depression. The patient is nervous/anxious. The patient does not have insomnia.      MEDICAL HISTORY:  Past Medical History:  Diagnosis Date  . Colon polyps   . Pancreatitis   . Rectal bleeding 02/24/14    SURGICAL HISTORY: Past Surgical History:  Procedure Laterality Date  . CHOLECYSTECTOMY    . COLONOSCOPY  02/28/14  . IR IMAGING GUIDED PORT INSERTION  07/18/2019  . POLYPECTOMY     colon polyp removed  . TONSILLECTOMY      SOCIAL HISTORY: Social History   Socioeconomic History  . Marital status: Married    Spouse name: Not on file  . Number of children: Not on file  . Years of education: Not on file  . Highest education level: Not on file  Occupational History    Comment: Retired from [employer]  . Financial resource strain: Not on file  . Food insecurity    Worry: Not on file    Inability: Not on file  . Transportation needs    Medical: Not on file    Non-medical: Not on file  Tobacco Use  . Smoking status: Current Every Day Smoker    Packs/day: 1.00    Years: 20.00    Pack years: 20.00    Types: Cigars  . Smokeless tobacco: Never Used  . Tobacco comment: smoke 1/2 ppd cigaretes since 68 yo. changed to cigars at age July 2004.   Substance and Sexual Activity  . Alcohol use: Yes    Comment: 6-7 drinks weekly  . Drug use: No  . Sexual activity: Not on file  Lifestyle  . Physical activity    Days per week: Not on file    Minutes per session: Not on file  . Stress: Not on file  Relationships  . Social Herbalist on phone: Not on file    Gets together: Not on file    Attends religious service: Not on file    Active member of club or organization: Not on file    Attends meetings of clubs or organizations: Not on file    Relationship status: Not on file  . Intimate partner violence    Fear of current or ex partner: Not on file    Emotionally abused: Not on file    Physically abused: Not on file     Forced sexual activity: Not on file  Other Topics Concern  . Not on file  Social History Narrative   Lives at home with wife; in [place]. retd from [employer] smoker; drinks liquor. Children - grown up.     FAMILY HISTORY: Family History  Adopted: Yes    ALLERGIES:  is allergic to azithromycin.  MEDICATIONS:  Current Outpatient Medications  Medication Sig Dispense Refill  . allopurinol (ZYLOPRIM) 300 MG tablet Take 1 tablet (300 mg total) by mouth 2 (two) times daily. 120 tablet 0  . lidocaine-prilocaine (EMLA) cream Apply generously on the port 30-45 prior to accessing the port. 30 g 0  . ondansetron (ZOFRAN) 8 MG tablet One pill every 8 hours as needed for nausea/vomitting. 40 tablet 1  . predniSONE (DELTASONE) 50 MG tablet Take 2 tablets once day x 5 days. Take with food. 10 tablet 4  . prochlorperazine (COMPAZINE) 10 MG tablet Take 1 tablet (10 mg  total) by mouth every 6 (six) hours as needed for nausea or vomiting. 40 tablet 1   No current facility-administered medications for this visit.       Marland Kitchen  PHYSICAL EXAMINATION: ECOG PERFORMANCE STATUS: 1 - Symptomatic but completely ambulatory  Vitals:   07/20/19 0847  BP: 123/76  Pulse: (!) 101  Temp: 98.1 F (36.7 C)   Filed Weights   07/20/19 0847  Weight: 264 lb (119.7 kg)    Physical Exam  Constitutional: He is oriented to person, place, and time and well-developed, well-nourished, and in no distress.  HENT:  Head: Normocephalic and atraumatic.  Mouth/Throat: Oropharynx is clear and moist. No oropharyngeal exudate.  Eyes: Pupils are equal, round, and reactive to light.  Neck: Normal range of motion. Neck supple.  Bilateral bulky lymphadenopathy left more than right.  Bilateral axillary adenopathy 1 to 2 cm in size.  Bilateral inguinal adenopathy 1 to 2 cm in size.  Cardiovascular: Normal rate and regular rhythm.  Pulmonary/Chest: Effort normal and breath sounds normal. No respiratory distress. He has  no wheezes.  Abdominal: Soft. Bowel sounds are normal. He exhibits no distension and no mass. There is no abdominal tenderness. There is no rebound and no guarding.  Musculoskeletal: Normal range of motion.        General: No tenderness or edema.  Neurological: He is alert and oriented to person, place, and time.  Skin: Skin is warm.  Psychiatric: Affect normal.     LABORATORY DATA:  I have reviewed the data as listed Lab Results  Component Value Date   WBC 6.7 07/20/2019   HGB 10.9 (L) 07/20/2019   HCT 33.7 (L) 07/20/2019   MCV 98.0 07/20/2019   PLT 158 07/20/2019   Recent Labs    07/03/19 1539 07/13/19 0844 07/18/19 1304 07/20/19 0830  NA 138 139 137 135  K 4.3 3.9 4.1 4.2  CL 103 103 101 99  CO2 '30 26 28 27  '$ GLUCOSE 104* 158* 96 122*  BUN 28* 29* 35* 27*  CREATININE 1.22 1.05 0.99 1.07  CALCIUM 9.6 9.2 9.0 8.9  GFRNONAA >60 >60 >60 >60  GFRAA >60 >60 >60 >60  PROT 6.9  --   --   --   ALBUMIN 3.8  --   --   --   AST 25  --   --   --   ALT 23  --   --   --   ALKPHOS 75  --   --   --   BILITOT 2.1*  --   --   --     RADIOGRAPHIC STUDIES: I have personally reviewed the radiological images as listed and agreed with the findings in the report. Ct Soft Tissue Neck W Contrast  Result Date: 06/29/2019 CLINICAL DATA:  Dysphagia and intermittent hoarseness over the last month. Smoking history. EXAM: CT NECK WITH CONTRAST TECHNIQUE: Multidetector CT imaging of the neck was performed using the standard protocol following the bolus administration of intravenous contrast. CONTRAST:  67m OMNIPAQUE IOHEXOL 300 MG/ML  SOLN COMPARISON:  None. FINDINGS: Pharynx and larynx: There is a 6 x 4 x 4 cm mass at the tongue base/vallecula, flattening the epiglottis inferiorly and encroaching markedly upon the oropharynx. This appears fairly well circumscribed and has homogeneous density. Massive bilateral neck adenopathy left worse than right described below without necrosis leads me to favor  the diagnosis of lymphoma, with this large oropharyngeal mass probably representing lymphoma within the lingual tonsillar tissue, more likely than  a primary squamous cell carcinoma. Salivary glands: Parotid and submandibular glands are intrinsically normal. Slight prominence intraparotid nodes. Thyroid: Normal Lymph nodes: Massive bilateral neck lymphadenopathy. Largest node on the right is a level 2 node with diameter of 2.6 cm. Massive nodal confluence at level 2 on the left measures 6.4 x 5.1 cm axial image 50. Adenopathy in the lower neck is much more bulky on the left than the right including extensive supraclavicular lymphadenopathy, again left more than right. None of these nodes show necrosis. The pattern favors lymphoma over metastatic disease. Smaller nodes are evident in the axillary regions and superior mediastinum, probably also involved. Vascular: Normal Limited intracranial: Normal Visualized orbits: Normal Mastoids and visualized paranasal sinuses: Mucosal thickening and or cysts/polyps in the left maxillary sinus. Lesser mucosal thickening in the right maxillary sinus. Scattered opacified ethmoid air cells. Skeleton: Degenerative cervical spondylosis. Upper chest: No lung pathology. Small pathologic superior mediastinal nodes as discussed above. Other: None IMPRESSION: Massive lymphadenopathy pattern more extensive on the left than the right. 6 x 4 x 4 cm mass at the tongue base/vallecula with displacement of the epiglottis and marked encroachment upon the oropharynx. In this setting, this is felt to be lymphoid tissue involvement by the same process, probably malignant lymphoma, rather than a primary squamous cell carcinoma of the tongue base. Density of this mass and of the lymph nodes is homogeneous and there are no necrotic nodes. See above for full discussion. Lesser involvement of the nodes in the superior mediastinum axillary regions. Electronically Signed   By: Nelson Chimes M.D.   On:  06/29/2019 13:02   Nm Pet Image Initial (pi) Skull Base To Thigh  Result Date: 07/10/2019 CLINICAL DATA:  Initial treatment strategy for lymphoma. EXAM: NUCLEAR MEDICINE PET SKULL BASE TO THIGH TECHNIQUE: 14.5 mCi F-18 FDG was injected intravenously. Full-ring PET imaging was performed from the skull base to thigh after the radiotracer. CT data was obtained and used for attenuation correction and anatomic localization. Fasting blood glucose: 91 mg/dl COMPARISON:  CT neck 06/29/2019 FINDINGS: Mediastinal blood pool activity: SUV max 2.75 Liver activity: SUV max NA NECK: Massively enlarged and intensely hypermetabolic bilateral cervical lymph nodes involving the level II through level IV lymph node stations as well as the level 1 nodes bilaterally. Example massive lymph node complex in the LEFT level II nodal station measures 3.7 cm short axis SUV max equal 25.1. There is asymmetric soft tissue thickening in the posterior oropharynx and hypopharynx which crosses midline but appears to originate on the LEFT with SUV max equal 31.9. This mass encroaches upon the oropharynx and measures 3.6 cm. Incidental CT findings: Soft tissue mass in the posterior oropharynx and hypopharynx as above. CHEST: Intensely hypermetabolic mass of enlarged hypermetabolic LEFT and RIGHT supraclavicular nodes. LEFT supraclavicular nodes with SUV max equal 24 elevate the musculature. Hypermetabolic adenopathy involves the bilateral mediastinal nodes and bilateral axillary nodes. Incidental CT findings: No pulmonary nodularity ABDOMEN/PELVIS: Spleen is enlarged and uniformly hypermetabolic measuring 18 cm in craniocaudad dimension with SUV max equal 7.1. No abnormal activity liver. There are hypermetabolic periportal lymph nodes. Minimal periaortic retroperitoneal nodes. There are small lymph nodes along the iliac chains and operator node spaces. Hypermetabolic lymph nodes in the inguinal nodal station. Minimally enlarged nodes are in the  LEFT inguinal space with SUV max equal 11.6 are mildly enlarged at 12 mm. Testicles have normal metabolic activity. Incidental CT findings: None SKELETON: There is moderate diffuse metabolic activity within the vertebral bodies of the thoracic  lumbar spine with SUV max equal 7.1. Diffuse activity within the iliac bones and sacrum with SUV max equal 5.2. Incidental CT findings: none IMPRESSION: 1. Bulky hypermetabolic lymphadenopathy involving the cervical lymph nodes, mediastinal nodes, axillary nodes, periportal nodes and inguinal nodes. Findings consistent with high-grade lymphoma. Dominant adenopathy is within the neck and upper chest. 2. Bulky hypermetabolic mass in the posterior oropharynx and hypopharynx. 3. Enlarged and uniform HYPERMETABOLIC SPLEEN consistent with lymphoma involvement. 4. Diffuse moderately hypermetabolic activity marrow space. Differential include lymphoma involvement of the marrow versus marrow hyperplasia from anemia. Electronically Signed   By: Suzy Bouchard M.D.   On: 07/10/2019 15:37   [place] Core Biopsy (lymph Nodes)  Result Date: 07/04/2019 INDICATION: Multiple cervical lymph nodes with known base of tongue mass EXAM: ULTRASOUND-GUIDED BIOPSY OF LEFT CERVICAL LYMPH NODE JUST BELOW THE MANDIBLE MEDICATIONS: None. ANESTHESIA/SEDATION: None FLUOROSCOPY TIME:  Not applicable COMPLICATIONS: None immediate. PROCEDURE: Informed written consent was obtained from the patient after a thorough discussion of the procedural risks, benefits and alternatives. All questions were addressed. Maximal Sterile Barrier Technique was utilized including caps, mask, sterile gowns, sterile gloves, sterile drape, hand hygiene and skin antiseptic. A timeout was performed prior to the initiation of the procedure. Utilizing 1% lidocaine as local anesthetic and real-time ultrasound guidance, a 17 gauge guiding needle was placed percutaneously into the large submandibular cervical lymph node. Four 18 gauge core  biopsies were then obtained and sent on moistened Telfa pad for pathologic evaluation. The guiding needle was then removed and hemostasis obtained at the puncture site. The patient tolerated the procedure well was returned his room in satisfactory condition. IMPRESSION: Successful ultrasound-guided lymph node biopsy in the left neck. Electronically Signed   By: Inez Catalina M.D.   On: 07/04/2019 12:24   Ir Imaging Guided Port Insertion  Result Date: 07/18/2019 CLINICAL DATA:  Diffuse large B-cell lymphoma and need for porta cath for chemotherapy. EXAM: IMPLANTED PORT A CATH PLACEMENT WITH ULTRASOUND AND FLUOROSCOPIC GUIDANCE ANESTHESIA/SEDATION: 2.0 mg IV Versed; 100 mcg IV Fentanyl Total Moderate Sedation Time:  34 minutes The patient's level of consciousness and physiologic status were continuously monitored during the procedure by Radiology nursing. Additional Medications: 2 g IV Ancef. FLUOROSCOPY TIME:  30 seconds.  10.7 mGy. PROCEDURE: The procedure, risks, benefits, and alternatives were explained to the patient. Questions regarding the procedure were encouraged and answered. The patient understands and consents to the procedure. A time-out was performed prior to initiating the procedure. Ultrasound was utilized to confirm patency of the right internal jugular vein. The right neck and chest were prepped with chlorhexidine in a sterile fashion, and a sterile drape was applied covering the operative field. Maximum barrier sterile technique with sterile gowns and gloves were used for the procedure. Local anesthesia was provided with 1% lidocaine. After creating a small venotomy incision, a 21 gauge needle was advanced into the right internal jugular vein under direct, real-time ultrasound guidance. Ultrasound image documentation was performed. After securing guidewire access, an 8 Fr dilator was placed. A J-wire was kinked to measure appropriate catheter length. A subcutaneous port pocket was then created  along the upper chest wall utilizing sharp and blunt dissection. Portable cautery was utilized. The pocket was irrigated with sterile saline. A single lumen power injectable port was chosen for placement. The 8 Fr catheter was tunneled from the port pocket site to the venotomy incision. The port was placed in the pocket. External catheter was trimmed to appropriate length based on guidewire measurement. At  the venotomy, an 8 Fr peel-away sheath was placed over a guidewire. The catheter was then placed through the sheath and the sheath removed. Final catheter positioning was confirmed and documented with a fluoroscopic spot image. The port was accessed with a needle and aspirated and flushed with heparinized saline. The access needle was removed. The venotomy and port pocket incisions were closed with subcutaneous 3-0 Vicryl and subcuticular 4-0 Monocryl . Dermabond was applied to both incisions. COMPLICATIONS: COMPLICATIONS None FINDINGS: After catheter placement, the tip lies at the cavo-atrial junction. The catheter aspirates normally and is ready for immediate use. IMPRESSION: Placement of single lumen port a cath via right internal jugular vein. The catheter tip lies at the cavo-atrial junction. A power injectable port a cath was placed and is ready for immediate use. Electronically Signed   By: Aletta Edouard M.D.   On: 07/18/2019 15:49    ASSESSMENT & PLAN:   Lymphoma of lymph nodes ([facility]) #Diffuse large B-cell lymphoma-ABC subtype;  Based on PET scan; stage IV [involvement of the spleen]; s/p Rituxan-Prednisone x1.  # proceed with R-COP; Labs today reviewed;  acceptable for treatment today. HOLD adriamycin sec to borderline LOW EF of 45-50% [see below].  Given the borderline low ejection fraction-I would substitute Adriamycin for etoposide for now.   #Borderline low ejection fraction/diffuse left ventricular hypokinesis.  Patient has no prior history of cardiac disease.  Recommend evaluation with  cardiology.  Will refer to Dr. Fletcher Anon.   #Difficulty sleeping at night/anxiety attack-bulky neck adenopathy-suspect this will improve with the treatment helps with the lymphoma.  Growth factor-udenyca would be given as prophylaxis for chemotherapy-induced neutropenia to prevent febrile neutropenias. Discussed potential side effect- myalgias/arthralgias- recommend Claritin for 4 days.   # DISPOSITION: # Chemo today; but HELD Adriamcyin today.  # udenyca on 9/21 # referral to [place]- ASAP re: cardiomyopathy/ planning chemo # 10/02- MD-labs-cbc/Cmp/ldh- RCEOP; day-2 etop/d-3-Etop; d-4; udenyca- Dr.B  # 25 minutes face-to-face with the patient discussing the above plan of care; more than 50% of time spent on prognosis/ natural history; counseling and coordination.   All questions were answered. The patient knows to call the clinic with any problems, questions or concerns.    Cammie Sickle, MD 07/23/2019 7:46 AM

## 2019-07-20 NOTE — Progress Notes (Signed)
Patient stated that he had been having some trouble sleeping since he continues to have nightmares and has also stated that he wakes up scratching himself all over his body. Patient stated that he has been anxious throughout this time.

## 2019-07-23 ENCOUNTER — Other Ambulatory Visit: Payer: Self-pay

## 2019-07-23 ENCOUNTER — Inpatient Hospital Stay: Payer: Medicare Other

## 2019-07-23 DIAGNOSIS — C8338 Diffuse large B-cell lymphoma, lymph nodes of multiple sites: Secondary | ICD-10-CM | POA: Diagnosis not present

## 2019-07-23 DIAGNOSIS — Z5112 Encounter for antineoplastic immunotherapy: Secondary | ICD-10-CM | POA: Diagnosis not present

## 2019-07-23 DIAGNOSIS — Z5189 Encounter for other specified aftercare: Secondary | ICD-10-CM | POA: Diagnosis not present

## 2019-07-23 DIAGNOSIS — R590 Localized enlarged lymph nodes: Secondary | ICD-10-CM | POA: Diagnosis not present

## 2019-07-23 DIAGNOSIS — C8590 Non-Hodgkin lymphoma, unspecified, unspecified site: Secondary | ICD-10-CM

## 2019-07-23 DIAGNOSIS — Z5111 Encounter for antineoplastic chemotherapy: Secondary | ICD-10-CM | POA: Diagnosis not present

## 2019-07-23 MED ORDER — PEGFILGRASTIM-CBQV 6 MG/0.6ML ~~LOC~~ SOSY
6.0000 mg | PREFILLED_SYRINGE | Freq: Once | SUBCUTANEOUS | Status: AC
  Administered 2019-07-23: 11:00:00 6 mg via SUBCUTANEOUS

## 2019-07-24 ENCOUNTER — Encounter: Payer: Self-pay | Admitting: Internal Medicine

## 2019-07-24 ENCOUNTER — Other Ambulatory Visit: Payer: Self-pay | Admitting: *Deleted

## 2019-07-24 DIAGNOSIS — C8590 Non-Hodgkin lymphoma, unspecified, unspecified site: Secondary | ICD-10-CM

## 2019-07-29 DIAGNOSIS — I420 Dilated cardiomyopathy: Secondary | ICD-10-CM | POA: Insufficient documentation

## 2019-07-29 NOTE — Progress Notes (Signed)
Cardiology Office Note  Date:  07/30/2019   ID:  Mylan, Danese 1951-01-19, MRN [id number]  PCP:  [name], MD   Chief Complaint  Patient presents with  . other    Lymphoma of nodes, starting new treatment next week. Medications reviewed verbally.     HPI:  [name] is a 68 year old gentleman with past medical history of Smoker, 20+ years Borderline diabetes Diffuse large B-cell lymphoma, on prednisone Rituxan Referred by Dr. Rogue Bussing for cardiomyopathy, ejection fraction 45 to 50% on recent echocardiogram Scheduled to start chemo  Scheduled to start R-COP adriamycin held secondary  to mildly depressed EF of 45-50% Adriamycin substitured for etoposide for now.   Echo 07/16/19: at [facility]  1. The left ventricle has mildly reduced systolic function, with an ejection fraction of 45-50%. The cavity size was mildly dilated. Left ventricular diastolic parameters were normal. Left ventricular diffuse hypokinesis.  2. The right ventricle has mildly reduced systolic function. The cavity was mildly enlarged. There is no increase in right ventricular wall thickness.  June 29, 2019-CT- bulky bilateral neck adenopathy left more than right;  6 x 4 x 4 cm mass at the tongue base/vallecula with displacement of the epiglottis and marked encroachment upon the oropharynx.  September 2020-Diffuse B-cell lymphoma; ABC subtype; NEGATIVE FISH studies.    PET scan -bulky adenopathy involving neck /lymphadenopathy mediastinum /abdomen/pelvis inguinal lymph nodes; splenic involvement.    CT ABD 2015: Scattered mixed calcified and noncalcified atherosclerotic plaque  within a normal caliber abdominal aorta.  Feels good today Chemo rx is every 3 weeks throat swelling much better  Prior smoker  Reports having long history of snoring, sleeps in the other room from his wife secondary to snoring  Lab work reviewed with him in detail HBG 11.2, up from 9.1 No lipids available  through [facility]  Chronic lower extremity swelling likely going on for years He is uncertain if it is worse recently  PMH:   has a past medical history of Colon polyps, Pancreatitis, and Rectal bleeding (02/24/14).  PSH:    Past Surgical History:  Procedure Laterality Date  . CHOLECYSTECTOMY    . COLONOSCOPY  02/28/14  . IR IMAGING GUIDED PORT INSERTION  07/18/2019  . POLYPECTOMY     colon polyp removed  . TONSILLECTOMY      Current Outpatient Medications  Medication Sig Dispense Refill  . allopurinol (ZYLOPRIM) 300 MG tablet Take 1 tablet (300 mg total) by mouth 2 (two) times daily. 120 tablet 0  . lidocaine-prilocaine (EMLA) cream Apply generously on the port 30-45 prior to accessing the port. 30 g 0  . ondansetron (ZOFRAN) 8 MG tablet One pill every 8 hours as needed for nausea/vomitting. 40 tablet 1  . predniSONE (DELTASONE) 50 MG tablet Take 2 tablets once day x 5 days. Take with food. 10 tablet 4  . prochlorperazine (COMPAZINE) 10 MG tablet Take 1 tablet (10 mg total) by mouth every 6 (six) hours as needed for nausea or vomiting. 40 tablet 1   No current facility-administered medications for this visit.      Allergies:   Azithromycin   Social History:  The patient  reports that he has been smoking cigars. He has a 20.00 pack-year smoking history. He has never used smokeless tobacco. He reports current alcohol use. He reports that he does not use drugs.   Family History:   family history is not on file. He was adopted.    Review of Systems: Review of  Systems  Constitutional: Negative.   HENT: Negative.   Respiratory: Positive for shortness of breath.   Cardiovascular: Positive for leg swelling.  Gastrointestinal: Negative.   Musculoskeletal: Negative.   Neurological: Negative.   Psychiatric/Behavioral: Negative.   All other systems reviewed and are negative.   PHYSICAL EXAM: VS:  BP 140/68 (BP Location: Right Arm, Patient Position: Sitting, Cuff Size: Large)    Pulse 78   Ht 6' (1.829 m)   Wt 274 lb (124.3 kg)   BMI 37.16 kg/m  , BMI Body mass index is 37.16 kg/m. GEN: Well nourished, well developed, in no acute distress HEENT: normal Neck: no JVD, carotid bruits, or masses Cardiac: RRR; no murmurs, rubs, or gallops,no edema  1+ pitting lower extremity edema to below the knees Respiratory:  clear to auscultation bilaterally, normal work of breathing GI: soft, nontender, nondistended, + BS MS: no deformity or atrophy Skin: warm and dry, no rash Neuro:  Strength and sensation are intact Psych: euthymic mood, full affect   Recent Labs: 07/03/2019: ALT 23 07/20/2019: BUN 27; Creatinine, Ser 1.07; Hemoglobin 10.9; Platelets 158; Potassium 4.2; Sodium 135    Lipid Panel No results found for: CHOL, HDL, LDLCALC, TRIG    Wt Readings from Last 3 Encounters:  07/30/19 274 lb (124.3 kg)  07/20/19 264 lb (119.7 kg)  07/18/19 275 lb (124.7 kg)       ASSESSMENT AND PLAN:  Problem List Items Addressed This Visit      Cardiology Problems   Dilated cardiomyopathy ([place])     Other   Lymphoma of lymph nodes ([place]) - Primary   Morbid obesity ([place])   Compulsive tobacco user syndrome     Cardiomyopathy Etiology unclear, ---Concerning for undiagnosed and untreated sleep apnea We have made referral to pulmonary to have sleep study arranged He reports long history of snoring --Unable to exclude hypertensive heart disease, medication started as below --- Unable to exclude ischemia, he does have aortic atherosclerosis, long smoking history Pharmacologic Myoview ordered --- Grossly is not having significant CHF symptoms but does have leg swelling.  This leg swelling may be multifactorial including lymphedema He does not wear compression hose --For treatment we will add losartan 50 mg daily with Coreg 6.25 twice daily -We will call him with the results of his stress test  Lymphoma Reports that he is feeling much better, swelling in the back of  his throat is dramatically improved Undergoing chemotherapy every 3 weeks approximately  Hypertension Numbers elevated on today's visit, treatment with medications as above  Smoker We have encouraged him to continue to work on weaning his cigars  Aortic atherosclerosis Seen on CT scan in 2015, mild in nature descending distal aorta Will need lipid panel at some point  Lower extremity edema Etiology unclear, concerning for lymphedema given chronicity Likely exacerbated recently in the setting of anemia --Would recommend a BNP when he sees oncology for blood work Given mildly elevated creatinine, will hold off on diuretics at this time  Borderline diabetes Would also recommend routine hemoglobin A1c We have encouraged continued exercise, careful diet management in an effort to lose weight.   Disposition:   F/U  2 months  Long discussion with him concerning above  Total encounter time more than 60 minutes  Greater than 50% was spent in counseling and coordination of care with the patient    Signed, Esmond Plants, M.D., Ph.D. [place], [place]

## 2019-07-30 ENCOUNTER — Ambulatory Visit (INDEPENDENT_AMBULATORY_CARE_PROVIDER_SITE_OTHER): Payer: Medicare Other | Admitting: Cardiovascular Disease

## 2019-07-30 ENCOUNTER — Other Ambulatory Visit: Payer: Self-pay

## 2019-07-30 VITALS — BP 140/68 | HR 78 | Ht 72.0 in | Wt 274.0 lb

## 2019-07-30 DIAGNOSIS — F17200 Nicotine dependence, unspecified, uncomplicated: Secondary | ICD-10-CM

## 2019-07-30 DIAGNOSIS — C8590 Non-Hodgkin lymphoma, unspecified, unspecified site: Secondary | ICD-10-CM | POA: Diagnosis not present

## 2019-07-30 DIAGNOSIS — R29818 Other symptoms and signs involving the nervous system: Secondary | ICD-10-CM | POA: Diagnosis not present

## 2019-07-30 DIAGNOSIS — I420 Dilated cardiomyopathy: Secondary | ICD-10-CM | POA: Diagnosis not present

## 2019-07-30 DIAGNOSIS — R0602 Shortness of breath: Secondary | ICD-10-CM

## 2019-07-30 MED ORDER — CARVEDILOL 6.25 MG PO TABS: 6.2500 mg | ORAL_TABLET | Freq: Two times a day (BID) | ORAL | 3 refills | Status: DC

## 2019-07-30 MED ORDER — LOSARTAN POTASSIUM 50 MG PO TABS: 50.0000 mg | ORAL_TABLET | Freq: Every day | ORAL | 3 refills | Status: DC

## 2019-07-30 NOTE — Addendum Note (Signed)
Addended by: Minna Merritts on: 07/30/2019 01:33 PM   Modules accepted: Orders

## 2019-07-30 NOTE — Patient Instructions (Addendum)
Medication Instructions:  Your physician has recommended you make the following change in your medication:  1. Please START losartan 50 mg daily 2. Please START coreg 6.25 mg twice a day  If you need a refill on your cardiac medications before your next appointment, please call your pharmacy.    Lab work:  1. No new labs needed   If you have labs (blood work) drawn today and your tests are completely normal, you will receive your results only by: Marland Kitchen MyChart Message (if you have MyChart) OR . A paper copy in the mail If you have any lab test that is abnormal or we need to change your treatment, we will call you to review the results.   Testing/Procedures: Your physician has requested that you have a lexiscan myoview. For further information please visit [email or web address]. Please follow instruction sheet, as given. Please schedule for Thursday or Friday    [place]  Your caregiver has ordered a Stress Test with nuclear imaging. The purpose of this test is to evaluate the blood supply to your heart muscle. This procedure is referred to as a "Non-Invasive Stress Test." This is because other than having an IV started in your vein, nothing is inserted or "invades" your body. Cardiac stress tests are done to find areas of poor blood flow to the heart by determining the extent of coronary artery disease (CAD). Some patients exercise on a treadmill, which naturally increases the blood flow to your heart, while others who are  unable to walk on a treadmill due to physical limitations have a pharmacologic/chemical stress agent called Lexiscan . This medicine will mimic walking on a treadmill by temporarily increasing your coronary blood flow.   Please note: these test may take anywhere between 2-4 hours to complete  PLEASE REPORT TO [place] AT THE FIRST DESK WILL DIRECT YOU WHERE TO GO  Instructions regarding medication:   _x___:  Hold Carvedilol night  before procedure and morning of procedure  PLEASE NOTIFY THE OFFICE AT LEAST 24 HOURS IN ADVANCE IF YOU ARE UNABLE TO KEEP YOUR APPOINTMENT.  [phone] AND  PLEASE NOTIFY NUCLEAR MEDICINE AT [facility] AT LEAST 24 HOURS IN ADVANCE IF YOU ARE UNABLE TO KEEP YOUR APPOINTMENT. [phone]  How to prepare for your Myoview test:  1. Do not eat or drink after midnight 2. No caffeine for 24 hours prior to test 3. No smoking 24 hours prior to test. 4. Your medication may be taken with water.  If your doctor stopped a medication because of this test, do not take that medication. 5. Ladies, please do not wear dresses.  Skirts or pants are appropriate. Please wear a short sleeve shirt. 6. No perfume, cologne or lotion. 7. Wear comfortable walking shoes.     Follow-Up: At [facility], you and your health needs are our priority.  As part of our continuing mission to provide you with exceptional heart care, we have created designated Provider Care Teams.  These Care Teams include your primary Cardiologist (physician) and Advanced Practice Providers (APPs -  Physician Assistants and Nurse Practitioners) who all work together to provide you with the care you need, when you need it.  . You will need a follow up appointment in 2 month  . Providers on your designated Care Team:   . Murray Hodgkins, NP . Christell Faith, PA-C . Marrianne Mood, PA-C  Any Other Special Instructions Will Be Listed Below (If Applicable).  1. Referral to Pulmonology  has been placed. Please look out for a call from there office within a week. If you do not hear anything within a week please call : [phone]  2. Try compression hose for leg swelling   For educational health videos Log in to : [email or web address] Or : [email or web address], password : [email or web address]

## 2019-07-30 NOTE — Addendum Note (Signed)
Addended by: Alba Destine on: 07/30/2019 01:24 PM   Modules accepted: Orders

## 2019-08-03 ENCOUNTER — Inpatient Hospital Stay: Payer: Medicare Other | Attending: Oncology | Admitting: Oncology

## 2019-08-03 ENCOUNTER — Other Ambulatory Visit: Payer: Medicare Other

## 2019-08-03 ENCOUNTER — Other Ambulatory Visit: Payer: Self-pay

## 2019-08-03 ENCOUNTER — Ambulatory Visit: Payer: Medicare Other | Admitting: Internal Medicine

## 2019-08-03 ENCOUNTER — Encounter: Payer: Self-pay | Admitting: Oncology

## 2019-08-03 ENCOUNTER — Ambulatory Visit: Payer: Medicare Other

## 2019-08-03 ENCOUNTER — Telehealth: Payer: Self-pay | Admitting: *Deleted

## 2019-08-03 VITALS — BP 128/69 | HR 90 | Temp 98.1°F | Wt 274.4 lb

## 2019-08-03 DIAGNOSIS — Z5189 Encounter for other specified aftercare: Secondary | ICD-10-CM | POA: Insufficient documentation

## 2019-08-03 DIAGNOSIS — C8590 Non-Hodgkin lymphoma, unspecified, unspecified site: Secondary | ICD-10-CM

## 2019-08-03 DIAGNOSIS — C8338 Diffuse large B-cell lymphoma, lymph nodes of multiple sites: Secondary | ICD-10-CM | POA: Insufficient documentation

## 2019-08-03 DIAGNOSIS — L509 Urticaria, unspecified: Secondary | ICD-10-CM

## 2019-08-03 DIAGNOSIS — R350 Frequency of micturition: Secondary | ICD-10-CM | POA: Insufficient documentation

## 2019-08-03 DIAGNOSIS — Z5112 Encounter for antineoplastic immunotherapy: Secondary | ICD-10-CM | POA: Insufficient documentation

## 2019-08-03 DIAGNOSIS — R21 Rash and other nonspecific skin eruption: Secondary | ICD-10-CM | POA: Insufficient documentation

## 2019-08-03 DIAGNOSIS — Z5111 Encounter for antineoplastic chemotherapy: Secondary | ICD-10-CM | POA: Insufficient documentation

## 2019-08-03 MED ORDER — FAMOTIDINE 20 MG PO TABS: 20.0000 mg | ORAL_TABLET | Freq: Two times a day (BID) | ORAL | 0 refills | Status: DC

## 2019-08-03 MED ORDER — PREDNISONE 50 MG PO TABS: ORAL_TABLET | ORAL | 0 refills | Status: DC

## 2019-08-03 MED ORDER — DIPHENHYDRAMINE HCL 50 MG PO TABS: 50.0000 mg | ORAL_TABLET | Freq: Three times a day (TID) | ORAL | 0 refills | Status: DC

## 2019-08-03 NOTE — Telephone Encounter (Signed)
Spoke to Eric [name]- please check with pt if he wants to be evaluated in the [facility] and inform Eric [name].

## 2019-08-03 NOTE — Progress Notes (Signed)
Symptom Management Consult note [facility]  [phone] [phone]  Patient Care Team: [name], MD as PCP - General (Family Medicine) [name] [name], MD (General Surgery)   Name of the patient: Eric [name]  [id number]  [birth year]   Date of visit: 08/03/2019   Diagnosis-B-cell lymphoma  Chief complaint/ Reason for visit-rash/hives  Heme/Onc history:  Oncology History Overview Note  #June 21, 2019-CT- bulky bilateral neck adenopathy left more than right; September 2020-Diffuse B-cell lymphoma; ABC subtype; NEGATIVE FISH studies.  PET scan -bulky adenopathy involving neck /lymphadenopathy mediastinum /abdomen/pelvis inguinal lymph nodes; splenic involvement.  Question bone.  Stage IV [spleen involvement; no bone marrow biopsy].  BCL-2: Positive /BCL 6: Positive /MUM-1: Positive /CD10: Negative 'Ki-67: High proliferation index (90%) /CD30: Negative /Cyclin D1: Negative C-MYC: Equivocal (20% staining seen on limited and fragmented tissue) /EBER: Negative   # Ritux-Pred- 9/11; 9/18- R-COP [held adria- sec to EF 45-50%]  # Sep 2020- Borderline low EF/Left ventricular hypokinesis [refer to [facility]  # DIAGNOSIS: DLBCL  STAGE:   4      ;GOALS: Cure  CURRENT/MOST RECENT THERAPY : R-COP/-R-CEOP.     Lymphoma of lymph nodes ([place])  07/03/2019 Initial Diagnosis   Lymphoma of lymph nodes ([place])   07/13/2019 -  Chemotherapy   The patient had palonosetron (ALOXI) injection 0.25 mg, 0.25 mg, Intravenous,  Once, 1 of 6 cycles Administration: 0.25 mg (07/20/2019) pegfilgrastim-cbqv (UDENYCA) injection 6 mg, 6 mg, Subcutaneous, Once, 1 of 6 cycles Administration: 6 mg (07/23/2019) vinCRIStine (ONCOVIN) 2 mg in sodium chloride 0.9 % 50 mL chemo infusion, 2 mg, Intravenous,  Once, 1 of 6 cycles Administration: 2 mg (07/20/2019) cyclophosphamide (CYTOXAN) 1,900 mg in sodium chloride 0.9 % 250 mL chemo infusion, 750 mg/m2 = 1,900 mg,  Intravenous,  Once, 1 of 6 cycles Administration: 1,900 mg (07/20/2019) etoposide (VEPESID) 250 mg in sodium chloride 0.9 % 1,000 mL chemo infusion, 100 mg/m2 = 250 mg, Intravenous,  Once, 0 of 5 cycles dexamethasone (DECADRON) 12 mg in sodium chloride 0.9 % 145 mL IVPB, , Intravenous,  Once, 2 of 7 cycles Administration:  (07/13/2019),  (07/20/2019) riTUXimab-pvvr (RUXIENCE) 900 mg in sodium chloride 0.9 % 250 mL (2.6471 mg/mL) infusion, 375 mg/m2 = 900 mg (100 % of original dose 375 mg/m2), Intravenous,  Once, 1 of 1 cycle Dose modification: 375 mg/m2 (original dose 375 mg/m2, Cycle 1) Administration: 900 mg (07/13/2019)  for chemotherapy treatment.      Interval history-patient presents to symptom management clinic for complaints of rash that started yesterday evening.  He denies itching.  States it feels hot to the touch.  It is not painful.  Denies any new laundry detergents, lotions or being out in the sun.  Was recently started on losartan and Coreg for systolic ventricular function by Dr. Rockey Situ.  He is also taking allopurinol for TLS which can also cause a skin rash (2 to 3% incidence).  He denies any fevers or illness, chest pain, shortness of breath, dysphasia, nausea, vomiting, constipation or diarrhea.  He was recently evaluated by Dr. Rogue Bussing after completing cycle 2 of Cytoxan, Rituxan and vincristine (R-CHOP).  Adriamycin currently on hold due to decreased ejection fraction.  He received Neulasta support.  He was referred to Dr. Rockey Situ for cardiomyopathy.  There was some concern for undiagnosed and untreated sleep apnea and hypertension.  He was scheduled for a stress test and started on losartan 50 mg daily and Coreg 6.25 twice daily.  ECOG FS:0 - Asymptomatic  Review of systems- Review of Systems  Skin: Positive for rash. Negative for itching.    Current treatment- s/p 2 cycles Rituxan, Cytoxan with Neulasta.  Allergies  Allergen Reactions   Azithromycin Itching      Past Medical History:  Diagnosis Date   Colon polyps    Pancreatitis    Rectal bleeding 02/24/14     Past Surgical History:  Procedure Laterality Date   CHOLECYSTECTOMY     COLONOSCOPY  02/28/14   IR IMAGING GUIDED PORT INSERTION  07/18/2019   POLYPECTOMY     colon polyp removed   TONSILLECTOMY      Social History   Socioeconomic History   Marital status: Married    Spouse name: Not on file   Number of children: Not on file   Years of education: Not on file   Highest education level: Not on file  Occupational History    Comment: Retired from [employer] resource strain: Not on file   Food insecurity    Worry: Not on file    Inability: Not on file   Transportation needs    Medical: Not on file    Non-medical: Not on file  Tobacco Use   Smoking status: Current Every Day Smoker    Packs/day: 1.00    Years: 20.00    Pack years: 20.00    Types: Cigars   Smokeless tobacco: Never Used   Tobacco comment: smoke 1/2 ppd cigaretes since 68 yo. changed to cigars at age July 2004.   Substance and Sexual Activity   Alcohol use: Yes    Comment: 6-7 drinks weekly   Drug use: No   Sexual activity: Not on file  Lifestyle   Physical activity    Days per week: Not on file    Minutes per session: Not on file   Stress: Not on file  Relationships   Social connections    Talks on phone: Not on file    Gets together: Not on file    Attends religious service: Not on file    Active member of club or organization: Not on file    Attends meetings of clubs or organizations: Not on file    Relationship status: Not on file   Intimate partner violence    Fear of current or ex partner: Not on file    Emotionally abused: Not on file    Physically abused: Not on file    Forced sexual activity: Not on file  Other Topics Concern   Not on file  Social History Narrative   Lives at home with wife; in [place]. retd from [employer] smoker; drinks liquor. Children - grown up.     Family History  Adopted: Yes     Current Outpatient Medications:    allopurinol (ZYLOPRIM) 300 MG tablet, Take 1 tablet (300 mg total) by mouth 2 (two) times daily., Disp: 120 tablet, Rfl: 0   carvedilol (COREG) 6.25 MG tablet, Take 1 tablet (6.25 mg total) by mouth 2 (two) times daily., Disp: 180 tablet, Rfl: 3   lidocaine-prilocaine (EMLA) cream, Apply generously on the port 30-45 prior to accessing the port., Disp: 30 g, Rfl: 0   losartan (COZAAR) 50 MG tablet, Take 1 tablet (50 mg total) by mouth daily., Disp: 90 tablet, Rfl: 3   ondansetron (ZOFRAN) 8 MG tablet, One pill every 8 hours as needed for nausea/vomitting., Disp: 40 tablet, Rfl: 1   predniSONE (  DELTASONE) 50 MG tablet, Take 2 tablets once day x 5 days. Take with food., Disp: 10 tablet, Rfl: 4   prochlorperazine (COMPAZINE) 10 MG tablet, Take 1 tablet (10 mg total) by mouth every 6 (six) hours as needed for nausea or vomiting., Disp: 40 tablet, Rfl: 1   diphenhydrAMINE (BENADRYL) 50 MG tablet, Take 1 tablet (50 mg total) by mouth every 8 (eight) hours., Disp: 30 tablet, Rfl: 0   famotidine (PEPCID) 20 MG tablet, Take 1 tablet (20 mg total) by mouth 2 (two) times daily., Disp: 30 tablet, Rfl: 0   predniSONE (DELTASONE) 50 MG tablet, Take 2 tablets daily for 5 days., Disp: 10 tablet, Rfl: 0  Physical exam:  Vitals:   08/03/19 1446  BP: 128/69  Pulse: 90  Temp: 98.1 F (36.7 C)  TempSrc: Tympanic  Weight: 274 lb 6 oz (124.5 kg)   Physical Exam Skin:    Findings: Rash present. Rash is urticarial.    Media Information      Media Information    Document Information  Photos    08/03/2019 14:55  Attached To:  Office Visit on 08/03/19 with Jacquelin Hawking, NP  Source Information  Jacquelin Hawking, NP   [facility]   Media Information    Document Information  CMP Latest Ref Rng & Units 07/20/2019  Glucose 70 - 99 mg/dL 122(H)  BUN 8  - 23 mg/dL 27(H)  Creatinine 0.61 - 1.24 mg/dL 1.07  Sodium 135 - 145 mmol/L 135  Potassium 3.5 - 5.1 mmol/L 4.2  Chloride 98 - 111 mmol/L 99  CO2 22 - 32 mmol/L 27  Calcium 8.9 - 10.3 mg/dL 8.9  Total Protein 6.5 - 8.1 g/dL -  Total Bilirubin 0.3 - 1.2 mg/dL -  Alkaline Phos 38 - 126 U/L -  AST 15 - 41 U/L -  ALT 0 - 44 U/L -   CBC Latest Ref Rng & Units 07/20/2019  WBC 4.0 - 10.5 K/uL 6.7  Hemoglobin 13.0 - 17.0 g/dL 10.9(L)  Hematocrit 39.0 - 52.0 % 33.7(L)  Platelets 150 - 400 K/uL 158    No images are attached to the encounter.  Nm Pet Image Initial (pi) Skull Base To Thigh  Result Date: 07/10/2019 CLINICAL DATA:  Initial treatment strategy for lymphoma. EXAM: NUCLEAR MEDICINE PET SKULL BASE TO THIGH TECHNIQUE: 14.5 mCi F-18 FDG was injected intravenously. Full-ring PET imaging was performed from the skull base to thigh after the radiotracer. CT data was obtained and used for attenuation correction and anatomic localization. Fasting blood glucose: 91 mg/dl COMPARISON:  CT neck 06/29/2019 FINDINGS: Mediastinal blood pool activity: SUV max 2.75 Liver activity: SUV max NA NECK: Massively enlarged and intensely hypermetabolic bilateral cervical lymph nodes involving the level II through level IV lymph node stations as well as the level 1 nodes bilaterally. Example massive lymph node complex in the LEFT level II nodal station measures 3.7 cm short axis SUV max equal 25.1. There is asymmetric soft tissue thickening in the posterior oropharynx and hypopharynx which crosses midline but appears to originate on the LEFT with SUV max equal 31.9. This mass encroaches upon the oropharynx and measures 3.6 cm. Incidental CT findings: Soft tissue mass in the posterior oropharynx and hypopharynx as above. CHEST: Intensely hypermetabolic mass of enlarged hypermetabolic LEFT and RIGHT supraclavicular nodes. LEFT supraclavicular nodes with SUV max equal 24 elevate the musculature. Hypermetabolic adenopathy  involves the bilateral mediastinal nodes and bilateral axillary nodes. Incidental CT findings: No pulmonary nodularity ABDOMEN/PELVIS:  Spleen is enlarged and uniformly hypermetabolic measuring 18 cm in craniocaudad dimension with SUV max equal 7.1. No abnormal activity liver. There are hypermetabolic periportal lymph nodes. Minimal periaortic retroperitoneal nodes. There are small lymph nodes along the iliac chains and operator node spaces. Hypermetabolic lymph nodes in the inguinal nodal station. Minimally enlarged nodes are in the LEFT inguinal space with SUV max equal 11.6 are mildly enlarged at 12 mm. Testicles have normal metabolic activity. Incidental CT findings: None SKELETON: There is moderate diffuse metabolic activity within the vertebral bodies of the thoracic lumbar spine with SUV max equal 7.1. Diffuse activity within the iliac bones and sacrum with SUV max equal 5.2. Incidental CT findings: none IMPRESSION: 1. Bulky hypermetabolic lymphadenopathy involving the cervical lymph nodes, mediastinal nodes, axillary nodes, periportal nodes and inguinal nodes. Findings consistent with high-grade lymphoma. Dominant adenopathy is within the neck and upper chest. 2. Bulky hypermetabolic mass in the posterior oropharynx and hypopharynx. 3. Enlarged and uniform HYPERMETABOLIC SPLEEN consistent with lymphoma involvement. 4. Diffuse moderately hypermetabolic activity marrow space. Differential include lymphoma involvement of the marrow versus marrow hyperplasia from anemia. Electronically Signed   By: Suzy Bouchard M.D.   On: 07/10/2019 15:37   Ir Imaging Guided Port Insertion  Result Date: 07/18/2019 CLINICAL DATA:  Diffuse large B-cell lymphoma and need for porta cath for chemotherapy. EXAM: IMPLANTED PORT A CATH PLACEMENT WITH ULTRASOUND AND FLUOROSCOPIC GUIDANCE ANESTHESIA/SEDATION: 2.0 mg IV Versed; 100 mcg IV Fentanyl Total Moderate Sedation Time:  34 minutes The patient's level of consciousness and  physiologic status were continuously monitored during the procedure by Radiology nursing. Additional Medications: 2 g IV Ancef. FLUOROSCOPY TIME:  30 seconds.  10.7 mGy. PROCEDURE: The procedure, risks, benefits, and alternatives were explained to the patient. Questions regarding the procedure were encouraged and answered. The patient understands and consents to the procedure. A time-out was performed prior to initiating the procedure. Ultrasound was utilized to confirm patency of the right internal jugular vein. The right neck and chest were prepped with chlorhexidine in a sterile fashion, and a sterile drape was applied covering the operative field. Maximum barrier sterile technique with sterile gowns and gloves were used for the procedure. Local anesthesia was provided with 1% lidocaine. After creating a small venotomy incision, a 21 gauge needle was advanced into the right internal jugular vein under direct, real-time ultrasound guidance. Ultrasound image documentation was performed. After securing guidewire access, an 8 Fr dilator was placed. A J-wire was kinked to measure appropriate catheter length. A subcutaneous port pocket was then created along the upper chest wall utilizing sharp and blunt dissection. Portable cautery was utilized. The pocket was irrigated with sterile saline. A single lumen power injectable port was chosen for placement. The 8 Fr catheter was tunneled from the port pocket site to the venotomy incision. The port was placed in the pocket. External catheter was trimmed to appropriate length based on guidewire measurement. At the venotomy, an 8 Fr peel-away sheath was placed over a guidewire. The catheter was then placed through the sheath and the sheath removed. Final catheter positioning was confirmed and documented with a fluoroscopic spot image. The port was accessed with a needle and aspirated and flushed with heparinized saline. The access needle was removed. The venotomy and port  pocket incisions were closed with subcutaneous 3-0 Vicryl and subcuticular 4-0 Monocryl . Dermabond was applied to both incisions. COMPLICATIONS: COMPLICATIONS None FINDINGS: After catheter placement, the tip lies at the cavo-atrial junction. The catheter aspirates normally and is  ready for immediate use. IMPRESSION: Placement of single lumen port a cath via right internal jugular vein. The catheter tip lies at the cavo-atrial junction. A power injectable port a cath was placed and is ready for immediate use. Electronically Signed   By: Aletta Edouard M.D.   On: 07/18/2019 15:49     Assessment and plan- Patient is a 68 y.o. male who presents to [facility] for complaints of Hives x1 day.  1.  Diffuse large B-cell lymphoma: Status post 2 cycles of modified R-CHOP due to decreased ejection fraction.  Last given on 07/23/2019.  PET scan showed bulky adenopathy involving neck lymphadenopathy mediastinum/abdomen/pelvis/inguinal lymph nodes, splenic involvement.  Scheduled to return to clinic for labs and consideration of cycle 3 with Neulasta support.  Tolerated first 2 treatments well with significant improvement of adenopathy.  2.  Cardiomyopathy: Echo revealed an EF of 45 to 50%.  Adriamycin held and Cytoxan substituted.  He was evaluated by Dr. Rockey Situ and started on Coreg and losartan for possibly an diagnosed and untreated sleep apnea and hypertension.  He started on this medication on 07/31/2019.  3.  Hives: Unclear etiology.  We spoke at length about possible exposures including household exposure such as lotions, laundry detergents and soaps.  Only new medications were losartan and Coreg approximately 2 days ago.  Does not have history of hives or rash in past.   Plan: Vital signs.  Stable. (No sob, dysphagia, tongue swelling, wheezing) Stop losartan. (< 1% incidence rash, <1% anaphylaxis) Stop Coreg (<1% incidence rash, < 1% anaphylaxis) Stop allopurinol.  RX prednisone 100 mg daily X 5 days. Take  Benadryl 50 mg in the morning and 50 mg at bedtime. Take Pepcid 20 mg twice daily.  We will touch base with Dr. Rockey Situ to see what his recommendations are.  Disposition: We will reach out to Dr. Rockey Situ for further recommendation and guidance. RTC on Friday 08/10/19 for labs, MD assessment and consideration of cycle 3.   Addendum:    Visit Diagnosis 1. Lymphoma of lymph nodes ([place])   2. Hives     Patient expressed understanding and was in agreement with this plan. He also understands that He can call clinic at any time with any questions, concerns, or complaints.   Greater than 50% was spent in counseling and coordination of care with this patient including but not limited to discussion of the relevant topics above (See A&P) including, but not limited to diagnosis and management of acute and chronic medical conditions.   Thank you for allowing me to participate in the care of this very pleasant patient.    Jacquelin Hawking, NP [place] [facility] Cell - [phone] Pager- [phone] 08/07/2019 11:14 AM

## 2019-08-03 NOTE — Progress Notes (Signed)
Patient here today for rash that was first noticed yesterday.  Patient states that the only thing new he can think of was starting carvedilol and Losartan this week by cardiology.

## 2019-08-03 NOTE — Telephone Encounter (Signed)
Patient called reporting that he has redness from his nipples up on chest and back and it is warm to the touch. He denies fever, chills, rash, or shortness of breath and it is not bothering him except to concern him as to why this is happening. His ears are also red and warm. This has been there since yesterday and he has had no chemotherapy or steroids since the 21st of Sept. Please advise

## 2019-08-03 NOTE — Telephone Encounter (Signed)
Patient coming in at 69 for [facility]

## 2019-08-05 ENCOUNTER — Telehealth: Payer: Self-pay | Admitting: Internal Medicine

## 2019-08-05 ENCOUNTER — Other Ambulatory Visit: Payer: Self-pay | Admitting: Internal Medicine

## 2019-08-05 NOTE — Telephone Encounter (Signed)
Spoke to pt re: rash; s/p eva in [facility].   Also asked pt to STOP ALLOPURINOL.

## 2019-08-08 ENCOUNTER — Telehealth: Payer: Self-pay | Admitting: Oncology

## 2019-08-08 NOTE — Telephone Encounter (Addendum)
Error

## 2019-08-10 ENCOUNTER — Encounter: Payer: Self-pay | Admitting: Internal Medicine

## 2019-08-10 ENCOUNTER — Other Ambulatory Visit: Payer: Self-pay

## 2019-08-10 ENCOUNTER — Encounter
Admission: RE | Admit: 2019-08-10 | Discharge: 2019-08-10 | Disposition: A | Discharge status: Home / Self Care | Payer: Medicare Other | Source: Ambulatory Visit | Attending: Cardiovascular Disease | Admitting: Cardiovascular Disease

## 2019-08-10 DIAGNOSIS — I420 Dilated cardiomyopathy: Secondary | ICD-10-CM | POA: Diagnosis not present

## 2019-08-10 DIAGNOSIS — R0602 Shortness of breath: Secondary | ICD-10-CM | POA: Insufficient documentation

## 2019-08-10 DIAGNOSIS — C8590 Non-Hodgkin lymphoma, unspecified, unspecified site: Secondary | ICD-10-CM

## 2019-08-10 LAB — NM MYOCAR MULTI W/SPECT W/WALL MOTION / EF
LV dias vol: 214 mL (ref 62–150)
LV sys vol: 98 mL
Peak HR: 92 {beats}/min
Percent HR: 59 %
Rest HR: 74 {beats}/min
SDS: 1
SRS: 9
SSS: 8
TID: 0.95

## 2019-08-10 MED ORDER — TECHNETIUM TC 99M TETROFOSMIN IV KIT
10.6600 | PACK | Freq: Once | INTRAVENOUS | Status: AC | PRN
  Administered 2019-08-10: 08:00:00 10.66 via INTRAVENOUS

## 2019-08-10 MED ORDER — REGADENOSON 0.4 MG/5ML IV SOLN
0.4000 mg | Freq: Once | INTRAVENOUS | Status: AC
  Administered 2019-08-10: 0.4 mg via INTRAVENOUS

## 2019-08-10 MED ORDER — TECHNETIUM TC 99M TETROFOSMIN IV KIT
30.0000 | PACK | Freq: Once | INTRAVENOUS | Status: AC | PRN
  Administered 2019-08-10: 33.471 via INTRAVENOUS

## 2019-08-10 NOTE — Progress Notes (Signed)
Patient pre screened for office appointment, no questions or concerns today. 

## 2019-08-13 ENCOUNTER — Inpatient Hospital Stay: Payer: Medicare Other

## 2019-08-13 ENCOUNTER — Other Ambulatory Visit: Payer: Self-pay

## 2019-08-13 ENCOUNTER — Telehealth: Payer: Self-pay

## 2019-08-13 ENCOUNTER — Inpatient Hospital Stay (HOSPITAL_BASED_OUTPATIENT_CLINIC_OR_DEPARTMENT_OTHER): Payer: Medicare Other | Admitting: Internal Medicine

## 2019-08-13 ENCOUNTER — Other Ambulatory Visit: Payer: Self-pay | Admitting: Internal Medicine

## 2019-08-13 VITALS — BP 116/72 | HR 65 | Temp 96.5°F | Resp 18

## 2019-08-13 VITALS — BP 125/79 | HR 86 | Temp 97.8°F | Resp 22 | Ht 72.0 in | Wt 273.0 lb

## 2019-08-13 DIAGNOSIS — L509 Urticaria, unspecified: Secondary | ICD-10-CM | POA: Diagnosis not present

## 2019-08-13 DIAGNOSIS — R350 Frequency of micturition: Secondary | ICD-10-CM

## 2019-08-13 DIAGNOSIS — Z5189 Encounter for other specified aftercare: Secondary | ICD-10-CM | POA: Diagnosis not present

## 2019-08-13 DIAGNOSIS — C8590 Non-Hodgkin lymphoma, unspecified, unspecified site: Secondary | ICD-10-CM

## 2019-08-13 DIAGNOSIS — I420 Dilated cardiomyopathy: Secondary | ICD-10-CM

## 2019-08-13 DIAGNOSIS — R21 Rash and other nonspecific skin eruption: Secondary | ICD-10-CM | POA: Diagnosis not present

## 2019-08-13 DIAGNOSIS — Z5112 Encounter for antineoplastic immunotherapy: Secondary | ICD-10-CM | POA: Diagnosis not present

## 2019-08-13 DIAGNOSIS — C8338 Diffuse large B-cell lymphoma, lymph nodes of multiple sites: Secondary | ICD-10-CM | POA: Diagnosis not present

## 2019-08-13 DIAGNOSIS — Z5111 Encounter for antineoplastic chemotherapy: Secondary | ICD-10-CM | POA: Diagnosis not present

## 2019-08-13 LAB — URINALYSIS, COMPLETE (UACMP) WITH MICROSCOPIC
Bilirubin Urine: NEGATIVE
Glucose, UA: NEGATIVE mg/dL
Hgb urine dipstick: NEGATIVE
Ketones, ur: NEGATIVE mg/dL
Nitrite: NEGATIVE
Protein, ur: NEGATIVE mg/dL
Specific Gravity, Urine: 1.017 (ref 1.005–1.030)
Squamous Epithelial / HPF: NONE SEEN (ref 0–5)
pH: 6 (ref 5.0–8.0)

## 2019-08-13 LAB — COMPREHENSIVE METABOLIC PANEL
ALT: 20 U/L (ref 0–44)
AST: 18 U/L (ref 15–41)
Albumin: 3.5 g/dL (ref 3.5–5.0)
Alkaline Phosphatase: 77 U/L (ref 38–126)
Anion gap: 9 (ref 5–15)
BUN: 19 mg/dL (ref 8–23)
CO2: 26 mmol/L (ref 22–32)
Calcium: 8.9 mg/dL (ref 8.9–10.3)
Chloride: 101 mmol/L (ref 98–111)
Creatinine, Ser: 0.92 mg/dL (ref 0.61–1.24)
GFR calc Af Amer: >60 mL/min (ref >60)
GFR calc non Af Amer: >60 mL/min (ref >60)
Glucose, Bld: 103 mg/dL — ABNORMAL HIGH (ref 70–99)
Potassium: 4 mmol/L (ref 3.5–5.1)
Sodium: 136 mmol/L (ref 135–145)
Total Bilirubin: 0.7 mg/dL (ref 0.3–1.2)
Total Protein: 6.9 g/dL (ref 6.5–8.1)

## 2019-08-13 LAB — CBC WITH DIFFERENTIAL/PLATELET
Abs Immature Granulocytes: 0.37 10*3/uL — ABNORMAL HIGH (ref 0.00–0.07)
Basophils Absolute: 0.1 10*3/uL (ref 0.0–0.1)
Basophils Relative: 1 %
Eosinophils Absolute: 0.2 10*3/uL (ref 0.0–0.5)
Eosinophils Relative: 3 %
HCT: 35.7 % — ABNORMAL LOW (ref 39.0–52.0)
Hemoglobin: 11.7 g/dL — ABNORMAL LOW (ref 13.0–17.0)
Immature Granulocytes: 5 %
Lymphocytes Relative: 12 %
Lymphs Abs: 0.9 10*3/uL (ref 0.7–4.0)
MCH: 30.6 pg (ref 26.0–34.0)
MCHC: 32.8 g/dL (ref 30.0–36.0)
MCV: 93.5 fL (ref 80.0–100.0)
Monocytes Absolute: 1 10*3/uL (ref 0.1–1.0)
Monocytes Relative: 13 %
Neutro Abs: 5.4 10*3/uL (ref 1.7–7.7)
Neutrophils Relative %: 66 %
Platelets: 173 10*3/uL (ref 150–400)
RBC: 3.82 MIL/uL — ABNORMAL LOW (ref 4.22–5.81)
RDW: 17.2 % — ABNORMAL HIGH (ref 11.5–15.5)
WBC: 8 10*3/uL (ref 4.0–10.5)
nRBC: 0 % (ref 0.0–0.2)

## 2019-08-13 LAB — LACTATE DEHYDROGENASE: LDH: 205 U/L — ABNORMAL HIGH (ref 98–192)

## 2019-08-13 MED ORDER — HEPARIN SOD (PORK) LOCK FLUSH 100 UNIT/ML IV SOLN
500.0000 [IU] | Freq: Once | INTRAVENOUS | Status: AC | PRN
  Administered 2019-08-13: 16:00:00 500 [IU]
  Filled 2019-08-13: qty 5

## 2019-08-13 MED ORDER — SODIUM CHLORIDE 0.9 % IV SOLN
50.0000 mg/m2 | Freq: Once | INTRAVENOUS | Status: AC
  Administered 2019-08-13: 11:00:00 130 mg via INTRAVENOUS
  Filled 2019-08-13: qty 6.5

## 2019-08-13 MED ORDER — SODIUM CHLORIDE 0.9 % IV SOLN: 100.0000 mg/m2 | Freq: Once | INTRAVENOUS | Status: DC

## 2019-08-13 MED ORDER — SODIUM CHLORIDE 0.9 % IV SOLN
Freq: Once | INTRAVENOUS | Status: AC
  Administered 2019-08-13: 09:00:00 via INTRAVENOUS
  Filled 2019-08-13: qty 5

## 2019-08-13 MED ORDER — VINCRISTINE SULFATE CHEMO INJECTION 1 MG/ML
2.0000 mg | Freq: Once | INTRAVENOUS | Status: AC
  Administered 2019-08-13: 2 mg via INTRAVENOUS
  Filled 2019-08-13: qty 2

## 2019-08-13 MED ORDER — SODIUM CHLORIDE 0.9% FLUSH
10.0000 mL | Freq: Once | INTRAVENOUS | Status: AC
  Administered 2019-08-13: 10 mL via INTRAVENOUS
  Filled 2019-08-13: qty 10

## 2019-08-13 MED ORDER — ACETAMINOPHEN 325 MG PO TABS
650.0000 mg | ORAL_TABLET | Freq: Once | ORAL | Status: AC
  Administered 2019-08-13: 650 mg via ORAL
  Filled 2019-08-13: qty 2

## 2019-08-13 MED ORDER — PALONOSETRON HCL INJECTION 0.25 MG/5ML
0.2500 mg | Freq: Once | INTRAVENOUS | Status: AC
  Administered 2019-08-13: 09:00:00 0.25 mg via INTRAVENOUS
  Filled 2019-08-13: qty 5

## 2019-08-13 MED ORDER — SODIUM CHLORIDE 0.9 % IV SOLN
750.0000 mg/m2 | Freq: Once | INTRAVENOUS | Status: AC
  Administered 2019-08-13: 10:00:00 1900 mg via INTRAVENOUS
  Filled 2019-08-13: qty 95

## 2019-08-13 MED ORDER — DIPHENHYDRAMINE HCL 25 MG PO CAPS
50.0000 mg | ORAL_CAPSULE | Freq: Once | ORAL | Status: AC
  Administered 2019-08-13: 50 mg via ORAL
  Filled 2019-08-13: qty 2

## 2019-08-13 MED ORDER — SODIUM CHLORIDE 0.9 % IV SOLN: 375.0000 mg/m2 | Freq: Once | INTRAVENOUS | Status: DC

## 2019-08-13 MED ORDER — SODIUM CHLORIDE 0.9 % IV SOLN
Freq: Once | INTRAVENOUS | Status: AC
  Administered 2019-08-13: 09:00:00 via INTRAVENOUS
  Filled 2019-08-13: qty 250

## 2019-08-13 MED ORDER — SODIUM CHLORIDE 0.9 % IV SOLN
375.0000 mg/m2 | Freq: Once | INTRAVENOUS | Status: AC
  Administered 2019-08-13: 12:00:00 900 mg via INTRAVENOUS
  Filled 2019-08-13: qty 50

## 2019-08-13 NOTE — Progress Notes (Signed)
Discussed with Dr. Rockey Situ; does not recommend any intervention at this time for the patient's noted borderline low ejection fraction.   I would recommend a MUGA scan for further evaluation-to better document ejection fraction.  If patient continues to have borderline low ejection fraction; I would not recommend anthracycline/Adriamycin; and continue R-CEOP chemotherapy.  However if ejection fraction is normal-Adriamycin could be considered.  I left a message for the patient/recommending MUGA scan.  Please schedule ASAP.  Keep other appointments as planned

## 2019-08-13 NOTE — Progress Notes (Signed)
[facility] NOTE  Patient Care Team: [name], MD as PCP - General (Family Medicine) [name] [name], MD (General Surgery)  CHIEF COMPLAINTS/PURPOSE OF CONSULTATION: Diffuse large B-cell lymphoma  #  Oncology History Overview Note  #June 29, 2019-CT- bulky bilateral neck adenopathy left more than right; September 2020-Diffuse B-cell lymphoma; ABC subtype; NEGATIVE FISH studies.  PET scan -bulky adenopathy involving neck /lymphadenopathy mediastinum /abdomen/pelvis inguinal lymph nodes; splenic involvement.  Question bone.  Stage IV [spleen involvement; no bone marrow biopsy].  BCL-2: Positive /BCL 6: Positive /MUM-1: Positive /CD10: Negative 'Ki-67: High proliferation index (90%) /CD30: Negative /Cyclin D1: Negative C-MYC: Equivocal (20% staining seen on limited and fragmented tissue) /EBER: Negative   # Ritux-Pred- 9/11; 9/18- R-COP [held adria- sec to EF 45-50%]  # Sep 2020- Borderline low EF/Left ventricular hypokinesis [Dr.Gollan]-s October 9th stress test-nonischemic;   # DIAGNOSIS: DLBCL  STAGE:   4      ;GOALS: Cure  CURRENT/MOST RECENT THERAPY : R-COP/-R-CEOP.     Lymphoma of lymph nodes ([place])  07/03/2019 Initial Diagnosis   Lymphoma of lymph nodes ([place])   07/13/2019 -  Chemotherapy   The patient had palonosetron (ALOXI) injection 0.25 mg, 0.25 mg, Intravenous,  Once, 2 of 6 cycles Administration: 0.25 mg (07/20/2019), 0.25 mg (08/13/2019) pegfilgrastim-cbqv (UDENYCA) injection 6 mg, 6 mg, Subcutaneous, Once, 2 of 6 cycles Administration: 6 mg (07/23/2019) vinCRIStine (ONCOVIN) 2 mg in sodium chloride 0.9 % 50 mL chemo infusion, 2 mg, Intravenous,  Once, 2 of 6 cycles Administration: 2 mg (07/20/2019), 2 mg (08/13/2019) cyclophosphamide (CYTOXAN) 1,900 mg in sodium chloride 0.9 % 250 mL chemo infusion, 750 mg/m2 = 1,900 mg, Intravenous,  Once, 2 of 6 cycles Administration: 1,900 mg (07/20/2019), 1,900 mg (08/13/2019) etoposide (VEPESID) 250 mg  in sodium chloride 0.9 % 1,000 mL chemo infusion, 100 mg/m2 = 250 mg, Intravenous,  Once, 1 of 5 cycles Dose modification: 50 mg/m2 (original dose 100 mg/m2, Cycle 3, Reason: Provider Judgment) dexamethasone (DECADRON) 12 mg in sodium chloride 0.9 % 145 mL IVPB, , Intravenous,  Once, 3 of 7 cycles Administration:  (07/13/2019),  (07/20/2019),  (08/13/2019) riTUXimab-pvvr (RUXIENCE) 900 mg in sodium chloride 0.9 % 250 mL (2.6471 mg/mL) infusion, 375 mg/m2 = 900 mg (100 % of original dose 375 mg/m2), Intravenous,  Once, 1 of 1 cycle Dose modification: 375 mg/m2 (original dose 375 mg/m2, Cycle 1) Administration: 900 mg (07/13/2019)  for chemotherapy treatment.      HISTORY OF PRESENTING ILLNESS:  Eric [name] 68 y.o.  male with recently due for lab B-cell lymphoma is currently status post R-COP 3 weeks ago is here for follow-up/proceed with chemotherapy.  Patient noted significant improvement of his neck swelling/has been sleeping better at nighttime.  Noted to have increased frequency of urination the last few weeks.  No burning pain.  He noted to have a skin rash for which she was seen in the symptom management clinic in the interim.  Patient was given steroids/allopurinol stopped.  Symptoms resolved.  Review of Systems  Constitutional: Positive for malaise/fatigue and weight loss. Negative for chills, diaphoresis and fever.  HENT: Negative for nosebleeds and sore throat.   Eyes: Negative for double vision.  Respiratory: Positive for cough and shortness of breath. Negative for hemoptysis, sputum production and wheezing.   Cardiovascular: Negative for chest pain, palpitations, orthopnea and leg swelling.  Gastrointestinal: Negative for abdominal pain, blood in stool, constipation, diarrhea, heartburn, melena, nausea and vomiting.  Genitourinary: Negative for dysuria, frequency  and urgency.  Musculoskeletal: Negative for back pain and joint pain.  Skin: Negative.  Negative for itching and  rash.  Neurological: Negative for dizziness, tingling, focal weakness, weakness and headaches.  Endo/Heme/Allergies: Does not bruise/bleed easily.  Psychiatric/Behavioral: Negative for depression. The patient is nervous/anxious. The patient does not have insomnia.      MEDICAL HISTORY:  Past Medical History:  Diagnosis Date  . Colon polyps   . Pancreatitis   . Rectal bleeding 02/24/14    SURGICAL HISTORY: Past Surgical History:  Procedure Laterality Date  . CHOLECYSTECTOMY    . COLONOSCOPY  02/28/14  . IR IMAGING GUIDED PORT INSERTION  07/18/2019  . POLYPECTOMY     colon polyp removed  . TONSILLECTOMY      SOCIAL HISTORY: Social History   Socioeconomic History  . Marital status: Married    Spouse name: Not on file  . Number of children: Not on file  . Years of education: Not on file  . Highest education level: Not on file  Occupational History    Comment: Retired from [employer]  . Financial resource strain: Not on file  . Food insecurity    Worry: Not on file    Inability: Not on file  . Transportation needs    Medical: Not on file    Non-medical: Not on file  Tobacco Use  . Smoking status: Current Every Day Smoker    Packs/day: 1.00    Years: 20.00    Pack years: 20.00    Types: Cigars  . Smokeless tobacco: Never Used  . Tobacco comment: smoke 1/2 ppd cigaretes since 68 yo. changed to cigars at age July 2004.   Substance and Sexual Activity  . Alcohol use: Yes    Comment: 6-7 drinks weekly  . Drug use: No  . Sexual activity: Not on file  Lifestyle  . Physical activity    Days per week: Not on file    Minutes per session: Not on file  . Stress: Not on file  Relationships  . Social Herbalist on phone: Not on file    Gets together: Not on file    Attends religious service: Not on file    Active member of club or organization: Not on file    Attends meetings of clubs or organizations: Not on file    Relationship status: Not on file   . Intimate partner violence    Fear of current or ex partner: Not on file    Emotionally abused: Not on file    Physically abused: Not on file    Forced sexual activity: Not on file  Other Topics Concern  . Not on file  Social History Narrative   Lives at home with wife; in [place]. retd from [employer] smoker; drinks liquor. Children - grown up.     FAMILY HISTORY: Family History  Adopted: Yes    ALLERGIES:  is allergic to azithromycin.  MEDICATIONS:  Current Outpatient Medications  Medication Sig Dispense Refill  . allopurinol (ZYLOPRIM) 300 MG tablet Take 1 tablet (300 mg total) by mouth 2 (two) times daily. (Patient not taking: Reported on 08/10/2019) 120 tablet 0  . carvedilol (COREG) 6.25 MG tablet Take 1 tablet (6.25 mg total) by mouth 2 (two) times daily. (Patient not taking: Reported on 08/10/2019) 180 tablet 3  . diphenhydrAMINE (BENADRYL) 50 MG tablet Take 1 tablet (50 mg total) by mouth every 8 (eight) hours. (Patient not taking: Reported on 08/10/2019)  30 tablet 0  . famotidine (PEPCID) 20 MG tablet Take 1 tablet (20 mg total) by mouth 2 (two) times daily. (Patient not taking: Reported on 08/10/2019) 30 tablet 0  . lidocaine-prilocaine (EMLA) cream Apply generously on the port 30-45 prior to accessing the port. (Patient not taking: Reported on 08/10/2019) 30 g 0  . losartan (COZAAR) 50 MG tablet Take 1 tablet (50 mg total) by mouth daily. (Patient not taking: Reported on 08/10/2019) 90 tablet 3  . ondansetron (ZOFRAN) 8 MG tablet One pill every 8 hours as needed for nausea/vomitting. (Patient not taking: Reported on 08/10/2019) 40 tablet 1  . predniSONE (DELTASONE) 50 MG tablet Take 2 tablets once day x 5 days. Take with food. (Patient not taking: Reported on 08/10/2019) 10 tablet 4  . predniSONE (DELTASONE) 50 MG tablet Take 2 tablets daily for 5 days. (Patient not taking: Reported on 08/10/2019) 10 tablet 0  . prochlorperazine (COMPAZINE) 10 MG tablet Take 1 tablet (10 mg  total) by mouth every 6 (six) hours as needed for nausea or vomiting. (Patient not taking: Reported on 08/10/2019) 40 tablet 1   No current facility-administered medications for this visit.    Facility-Administered Medications Ordered in Other Visits  Medication Dose Route Frequency Provider Last Rate Last Dose  . heparin lock flush 100 unit/mL  500 Units Intracatheter Once PRN Cammie Sickle, MD          .  PHYSICAL EXAMINATION: ECOG PERFORMANCE STATUS: 1 - Symptomatic but completely ambulatory  Vitals:   08/13/19 0827  BP: 125/79  Pulse: 86  Resp: (!) 22  Temp: 97.8 F (36.6 C)   Filed Weights   08/13/19 0827  Weight: 273 lb (123.8 kg)    Physical Exam  Constitutional: He is oriented to person, place, and time and well-developed, well-nourished, and in no distress.  HENT:  Head: Normocephalic and atraumatic.  Mouth/Throat: Oropharynx is clear and moist. No oropharyngeal exudate.  Eyes: Pupils are equal, round, and reactive to light.  Neck: Normal range of motion. Neck supple.  Bilateral 2 to 3 cm lymphadenopathy left more than right (improved).   Bilateral axillary/inguinal 1 centimeter/improved  Cardiovascular: Normal rate and regular rhythm.  Pulmonary/Chest: Effort normal and breath sounds normal. No respiratory distress. He has no wheezes.  Abdominal: Soft. Bowel sounds are normal. He exhibits no distension and no mass. There is no abdominal tenderness. There is no rebound and no guarding.  Musculoskeletal: Normal range of motion.        General: No tenderness or edema.  Neurological: He is alert and oriented to person, place, and time.  Skin: Skin is warm.  Psychiatric: Affect normal.     LABORATORY DATA:  I have reviewed the data as listed Lab Results  Component Value Date   WBC 8.0 08/13/2019   HGB 11.7 (L) 08/13/2019   HCT 35.7 (L) 08/13/2019   MCV 93.5 08/13/2019   PLT 173 08/13/2019   Recent Labs    07/03/19 1539  07/18/19 1304  07/20/19 0830 08/13/19 0815  NA 138   < > 137 135 136  K 4.3   < > 4.1 4.2 4.0  CL 103   < > 101 99 101  CO2 30   < > _0 GLUCOSE 104*   < > 96 122* 103*  BUN 28*   < > 35* 27* 19  CREATININE 1.22   < > 0.99 1.07 0.92  CALCIUM 9.6   < > 9.0 8.9 8.9  GFRNONAA >60   < > >60 >60 >60  GFRAA >60   < > >60 >60 >60  PROT 6.9  --   --   --  6.9  ALBUMIN 3.8  --   --   --  3.5  AST 25  --   --   --  18  ALT 23  --   --   --  20  ALKPHOS 75  --   --   --  77  BILITOT 2.1*  --   --   --  0.7   < > = values in this interval not displayed.    RADIOGRAPHIC STUDIES: I have personally reviewed the radiological images as listed and agreed with the findings in the report. Nm Myocar Multi W/spect W/wall Motion / Ef  Result Date: 08/10/2019 Pharmacological myocardial perfusion imaging study with no significant  Ischemia Mild attenuation artifact likely secondary to body habitus Non-attenuation corrected images with diaphragmatic attenuation artifact Mild global hypokinesis, EF estimated at 36% (depressed ejection fraction likely secondary to GI uptake artifact) No EKG changes concerning for ischemia at peak stress or in recovery. CT attenuation corrected images with coronary calcification in the RCA, left circumflex Low risk scan Signed, Esmond Plants, MD, Ph.D [facility]   Ir Imaging Guided Port Insertion  Result Date: 07/18/2019 CLINICAL DATA:  Diffuse large B-cell lymphoma and need for porta cath for chemotherapy. EXAM: IMPLANTED PORT A CATH PLACEMENT WITH ULTRASOUND AND FLUOROSCOPIC GUIDANCE ANESTHESIA/SEDATION: 2.0 mg IV Versed; 100 mcg IV Fentanyl Total Moderate Sedation Time:  34 minutes The patient's level of consciousness and physiologic status were continuously monitored during the procedure by Radiology nursing. Additional Medications: 2 g IV Ancef. FLUOROSCOPY TIME:  30 seconds.  10.7 mGy. PROCEDURE: The procedure, risks, benefits, and alternatives were explained to the patient.  Questions regarding the procedure were encouraged and answered. The patient understands and consents to the procedure. A time-out was performed prior to initiating the procedure. Ultrasound was utilized to confirm patency of the right internal jugular vein. The right neck and chest were prepped with chlorhexidine in a sterile fashion, and a sterile drape was applied covering the operative field. Maximum barrier sterile technique with sterile gowns and gloves were used for the procedure. Local anesthesia was provided with 1% lidocaine. After creating a small venotomy incision, a 21 gauge needle was advanced into the right internal jugular vein under direct, real-time ultrasound guidance. Ultrasound image documentation was performed. After securing guidewire access, an 8 Fr dilator was placed. A J-wire was kinked to measure appropriate catheter length. A subcutaneous port pocket was then created along the upper chest wall utilizing sharp and blunt dissection. Portable cautery was utilized. The pocket was irrigated with sterile saline. A single lumen power injectable port was chosen for placement. The 8 Fr catheter was tunneled from the port pocket site to the venotomy incision. The port was placed in the pocket. External catheter was trimmed to appropriate length based on guidewire measurement. At the venotomy, an 8 Fr peel-away sheath was placed over a guidewire. The catheter was then placed through the sheath and the sheath removed. Final catheter positioning was confirmed and documented with a fluoroscopic spot image. The port was accessed with a needle and aspirated and flushed with heparinized saline. The access needle was removed. The venotomy and port pocket incisions were closed with subcutaneous 3-0 Vicryl and subcuticular 4-0 Monocryl . Dermabond was applied to both incisions. COMPLICATIONS: COMPLICATIONS None FINDINGS: After catheter placement, the tip  lies at the cavo-atrial junction. The catheter  aspirates normally and is ready for immediate use. IMPRESSION: Placement of single lumen port a cath via right internal jugular vein. The catheter tip lies at the cavo-atrial junction. A power injectable port a cath was placed and is ready for immediate use. Electronically Signed   By: Aletta Edouard M.D.   On: 07/18/2019 15:49    ASSESSMENT & PLAN:   Lymphoma of lymph nodes ([facility]) #Diffuse large B-cell lymphoma-ABC subtype;  Based on PET scan; stage IV [involvement of the spleen]; s/p R-COP x1 [pending cardiac work-up]  # proceed with R-CEOP #1 today; Labs today reviewed;  acceptable for treatment today.  Discussed with pharmacy.  # Borderline LOW EF of 45-50% [see below].  Given the borderline low ejection fraction-I would substitute Adriamycin for etoposide for now.  Cardiac stress test-not suggestive of ischemic etiology.  Will check with cardiology-regarding reinstating Adriamycin.?  MUGA scan.  Appreciate cardiology evaluation.  #Difficulty sleeping at night/anxiety attack-bulky neck adenopathy-- improved not resolved.  Patient will need sleep study down the line.  # rash- ? Allopurinol- stopped. Improved.  Added to allergy list.  # Increased frequency of urination-unclear etiology.  Will check a UA and culture.  Question prostatism symptoms/medication effect.  # DISPOSITION: UA/ culture # Proceed with chemo- this week as planned.  # 11/02- MD-labs-cbc/Cmp/ldh- RCEOP; day-2 etop/d-3-Etop; d-4; udenyca- Dr.B    All questions were answered. The patient knows to call the clinic with any problems, questions or concerns.    Cammie Sickle, MD 08/13/2019 12:49 PM

## 2019-08-13 NOTE — Telephone Encounter (Signed)
DPR on file. Patient's wife made aware of stress test results with verbalized understanding.

## 2019-08-13 NOTE — Assessment & Plan Note (Addendum)
#  Diffuse large B-cell lymphoma-ABC subtype;  Based on PET scan; stage IV [involvement of the spleen]; s/p R-COP x1 [pending cardiac work-up]  # proceed with R-CEOP #1 today; Labs today reviewed;  acceptable for treatment today.  Discussed with pharmacy.  # Borderline LOW EF of 45-50% [see below].  Given the borderline low ejection fraction-I would substitute Adriamycin for etoposide for now.  Cardiac stress test-not suggestive of ischemic etiology.  Will check with cardiology-regarding reinstating Adriamycin.?  MUGA scan.  Appreciate cardiology evaluation.  #Difficulty sleeping at night/anxiety attack-bulky neck adenopathy-- improved not resolved.  Patient will need sleep study down the line.  # rash- ? Allopurinol- stopped. Improved.  Added to allergy list.  # Increased frequency of urination-unclear etiology.  Will check a UA and culture.  Question prostatism symptoms/medication effect.  # DISPOSITION: UA/ culture # Proceed with chemo- this week as planned.  # 11/02- MD-labs-cbc/Cmp/ldh- RCEOP; day-2 etop/d-3-Etop; d-4; udenyca- Dr.B

## 2019-08-13 NOTE — Telephone Encounter (Signed)
-----   Message from Minna Merritts, MD sent at 08/12/2019 10:21 PM EDT ----- Stress test No ischemia noted as a cause of his depressed EF seen on echo We will need to work on lipids given some coronary calcification in the coronary arteries

## 2019-08-13 NOTE — Progress Notes (Signed)
Based on infusion RN note on 07/20/2019 pt had flushing and clammy after infusion. Per Dr. Rogue Bussing pt will receive Ruxience given as a first time, and if pt tolerates well, pt can receive rapid Ruxience infusion in the future.   1630: Pt tolerated infusion well. Pt and VS stable at discharge.

## 2019-08-14 ENCOUNTER — Other Ambulatory Visit: Payer: Self-pay

## 2019-08-14 ENCOUNTER — Other Ambulatory Visit: Payer: Self-pay | Admitting: Internal Medicine

## 2019-08-14 ENCOUNTER — Inpatient Hospital Stay: Payer: Medicare Other

## 2019-08-14 VITALS — BP 133/81 | HR 64 | Temp 97.0°F | Resp 18

## 2019-08-14 DIAGNOSIS — L509 Urticaria, unspecified: Secondary | ICD-10-CM | POA: Diagnosis not present

## 2019-08-14 DIAGNOSIS — C8590 Non-Hodgkin lymphoma, unspecified, unspecified site: Secondary | ICD-10-CM

## 2019-08-14 DIAGNOSIS — Z5189 Encounter for other specified aftercare: Secondary | ICD-10-CM | POA: Diagnosis not present

## 2019-08-14 DIAGNOSIS — C8338 Diffuse large B-cell lymphoma, lymph nodes of multiple sites: Secondary | ICD-10-CM | POA: Diagnosis not present

## 2019-08-14 DIAGNOSIS — Z5111 Encounter for antineoplastic chemotherapy: Secondary | ICD-10-CM | POA: Diagnosis not present

## 2019-08-14 DIAGNOSIS — Z5112 Encounter for antineoplastic immunotherapy: Secondary | ICD-10-CM | POA: Diagnosis not present

## 2019-08-14 DIAGNOSIS — R350 Frequency of micturition: Secondary | ICD-10-CM | POA: Diagnosis not present

## 2019-08-14 DIAGNOSIS — R21 Rash and other nonspecific skin eruption: Secondary | ICD-10-CM | POA: Diagnosis not present

## 2019-08-14 LAB — URINE CULTURE: Culture: NO GROWTH

## 2019-08-14 MED ORDER — SODIUM CHLORIDE 0.9 % IV SOLN
Freq: Once | INTRAVENOUS | Status: AC
  Administered 2019-08-14: 09:00:00 via INTRAVENOUS
  Filled 2019-08-14: qty 250

## 2019-08-14 MED ORDER — SODIUM CHLORIDE 0.9 % IV SOLN
50.0000 mg/m2 | Freq: Once | INTRAVENOUS | Status: AC
  Administered 2019-08-14: 130 mg via INTRAVENOUS
  Filled 2019-08-14: qty 6.5

## 2019-08-14 MED ORDER — HEPARIN SOD (PORK) LOCK FLUSH 100 UNIT/ML IV SOLN
500.0000 [IU] | Freq: Once | INTRAVENOUS | Status: AC
  Administered 2019-08-14: 12:00:00 500 [IU] via INTRAVENOUS
  Filled 2019-08-14: qty 5

## 2019-08-14 MED ORDER — DEXAMETHASONE SODIUM PHOSPHATE 10 MG/ML IJ SOLN
10.0000 mg | Freq: Once | INTRAMUSCULAR | Status: AC
  Administered 2019-08-14: 10 mg via INTRAVENOUS
  Filled 2019-08-14: qty 1

## 2019-08-14 NOTE — Progress Notes (Signed)
muga scan is scheduled.

## 2019-08-15 ENCOUNTER — Other Ambulatory Visit: Payer: Self-pay

## 2019-08-15 ENCOUNTER — Inpatient Hospital Stay: Payer: Medicare Other

## 2019-08-15 VITALS — BP 126/74 | HR 61 | Temp 97.3°F | Resp 18

## 2019-08-15 DIAGNOSIS — Z5111 Encounter for antineoplastic chemotherapy: Secondary | ICD-10-CM | POA: Diagnosis not present

## 2019-08-15 DIAGNOSIS — C8590 Non-Hodgkin lymphoma, unspecified, unspecified site: Secondary | ICD-10-CM

## 2019-08-15 DIAGNOSIS — C8338 Diffuse large B-cell lymphoma, lymph nodes of multiple sites: Secondary | ICD-10-CM | POA: Diagnosis not present

## 2019-08-15 DIAGNOSIS — R21 Rash and other nonspecific skin eruption: Secondary | ICD-10-CM | POA: Diagnosis not present

## 2019-08-15 DIAGNOSIS — Z5112 Encounter for antineoplastic immunotherapy: Secondary | ICD-10-CM | POA: Diagnosis not present

## 2019-08-15 DIAGNOSIS — Z5189 Encounter for other specified aftercare: Secondary | ICD-10-CM | POA: Diagnosis not present

## 2019-08-15 DIAGNOSIS — L509 Urticaria, unspecified: Secondary | ICD-10-CM | POA: Diagnosis not present

## 2019-08-15 DIAGNOSIS — R350 Frequency of micturition: Secondary | ICD-10-CM | POA: Diagnosis not present

## 2019-08-15 MED ORDER — DEXAMETHASONE SODIUM PHOSPHATE 10 MG/ML IJ SOLN
10.0000 mg | Freq: Once | INTRAMUSCULAR | Status: AC
  Administered 2019-08-15: 10 mg via INTRAVENOUS
  Filled 2019-08-15: qty 1

## 2019-08-15 MED ORDER — SODIUM CHLORIDE 0.9 % IV SOLN
INTRAVENOUS | Status: DC
  Administered 2019-08-15: 10:00:00 via INTRAVENOUS
  Filled 2019-08-15: qty 250

## 2019-08-15 MED ORDER — HEPARIN SOD (PORK) LOCK FLUSH 100 UNIT/ML IV SOLN
500.0000 [IU] | Freq: Once | INTRAVENOUS | Status: AC
  Administered 2019-08-15: 12:00:00 500 [IU] via INTRAVENOUS

## 2019-08-15 MED ORDER — SODIUM CHLORIDE 0.9% FLUSH
10.0000 mL | INTRAVENOUS | Status: DC | PRN
  Administered 2019-08-15: 10 mL via INTRAVENOUS
  Filled 2019-08-15: qty 10

## 2019-08-15 MED ORDER — SODIUM CHLORIDE 0.9 % IV SOLN
50.0000 mg/m2 | Freq: Once | INTRAVENOUS | Status: AC
  Administered 2019-08-15: 130 mg via INTRAVENOUS
  Filled 2019-08-15: qty 6.5

## 2019-08-16 ENCOUNTER — Other Ambulatory Visit: Payer: Self-pay

## 2019-08-16 ENCOUNTER — Inpatient Hospital Stay: Payer: Medicare Other

## 2019-08-16 DIAGNOSIS — C8338 Diffuse large B-cell lymphoma, lymph nodes of multiple sites: Secondary | ICD-10-CM | POA: Diagnosis not present

## 2019-08-16 DIAGNOSIS — R21 Rash and other nonspecific skin eruption: Secondary | ICD-10-CM | POA: Diagnosis not present

## 2019-08-16 DIAGNOSIS — Z5111 Encounter for antineoplastic chemotherapy: Secondary | ICD-10-CM | POA: Diagnosis not present

## 2019-08-16 DIAGNOSIS — Z5112 Encounter for antineoplastic immunotherapy: Secondary | ICD-10-CM | POA: Diagnosis not present

## 2019-08-16 DIAGNOSIS — L509 Urticaria, unspecified: Secondary | ICD-10-CM | POA: Diagnosis not present

## 2019-08-16 DIAGNOSIS — R350 Frequency of micturition: Secondary | ICD-10-CM | POA: Diagnosis not present

## 2019-08-16 DIAGNOSIS — Z5189 Encounter for other specified aftercare: Secondary | ICD-10-CM | POA: Diagnosis not present

## 2019-08-16 DIAGNOSIS — C8590 Non-Hodgkin lymphoma, unspecified, unspecified site: Secondary | ICD-10-CM

## 2019-08-16 MED ORDER — PEGFILGRASTIM-CBQV 6 MG/0.6ML ~~LOC~~ SOSY
6.0000 mg | PREFILLED_SYRINGE | Freq: Once | SUBCUTANEOUS | Status: AC
  Administered 2019-08-16: 12:00:00 6 mg via SUBCUTANEOUS

## 2019-08-21 ENCOUNTER — Ambulatory Visit
Admission: RE | Admit: 2019-08-21 | Discharge: 2019-08-21 | Disposition: A | Discharge status: Home / Self Care | Payer: Medicare Other | Source: Ambulatory Visit | Attending: Internal Medicine | Admitting: Internal Medicine

## 2019-08-21 ENCOUNTER — Other Ambulatory Visit: Payer: Self-pay

## 2019-08-21 DIAGNOSIS — C8590 Non-Hodgkin lymphoma, unspecified, unspecified site: Secondary | ICD-10-CM | POA: Diagnosis not present

## 2019-08-21 DIAGNOSIS — I420 Dilated cardiomyopathy: Secondary | ICD-10-CM | POA: Diagnosis not present

## 2019-08-21 DIAGNOSIS — I429 Cardiomyopathy, unspecified: Secondary | ICD-10-CM | POA: Diagnosis not present

## 2019-08-21 MED ORDER — TECHNETIUM TC 99M-LABELED RED BLOOD CELLS IV KIT
20.0000 | PACK | Freq: Once | INTRAVENOUS | Status: AC | PRN
  Administered 2019-08-21: 24 via INTRAVENOUS

## 2019-08-24 ENCOUNTER — Encounter: Payer: Self-pay | Admitting: Internal Medicine

## 2019-08-31 ENCOUNTER — Other Ambulatory Visit: Payer: Self-pay

## 2019-08-31 NOTE — Progress Notes (Signed)
Patient pre screened for office appointment, no questions or concerns today. 

## 2019-09-03 ENCOUNTER — Encounter: Payer: Self-pay | Admitting: Oncology

## 2019-09-03 ENCOUNTER — Inpatient Hospital Stay (HOSPITAL_BASED_OUTPATIENT_CLINIC_OR_DEPARTMENT_OTHER): Payer: Medicare Other | Admitting: Oncology

## 2019-09-03 ENCOUNTER — Inpatient Hospital Stay: Payer: Medicare Other | Attending: Oncology | Admitting: *Deleted

## 2019-09-03 ENCOUNTER — Other Ambulatory Visit: Payer: Self-pay

## 2019-09-03 ENCOUNTER — Inpatient Hospital Stay: Payer: Medicare Other

## 2019-09-03 VITALS — BP 111/68 | HR 92 | Temp 99.3°F | Resp 18 | Wt 272.1 lb

## 2019-09-03 DIAGNOSIS — Z5112 Encounter for antineoplastic immunotherapy: Secondary | ICD-10-CM | POA: Diagnosis not present

## 2019-09-03 DIAGNOSIS — R142 Eructation: Secondary | ICD-10-CM | POA: Diagnosis not present

## 2019-09-03 DIAGNOSIS — Z5111 Encounter for antineoplastic chemotherapy: Secondary | ICD-10-CM | POA: Diagnosis not present

## 2019-09-03 DIAGNOSIS — C8330 Diffuse large B-cell lymphoma, unspecified site: Secondary | ICD-10-CM

## 2019-09-03 DIAGNOSIS — C8590 Non-Hodgkin lymphoma, unspecified, unspecified site: Secondary | ICD-10-CM

## 2019-09-03 DIAGNOSIS — I420 Dilated cardiomyopathy: Secondary | ICD-10-CM

## 2019-09-03 DIAGNOSIS — C8338 Diffuse large B-cell lymphoma, lymph nodes of multiple sites: Secondary | ICD-10-CM | POA: Insufficient documentation

## 2019-09-03 DIAGNOSIS — Z5189 Encounter for other specified aftercare: Secondary | ICD-10-CM | POA: Insufficient documentation

## 2019-09-03 DIAGNOSIS — Z95828 Presence of other vascular implants and grafts: Secondary | ICD-10-CM

## 2019-09-03 LAB — COMPREHENSIVE METABOLIC PANEL
ALT: 23 U/L (ref 0–44)
AST: 22 U/L (ref 15–41)
Albumin: 3.4 g/dL — ABNORMAL LOW (ref 3.5–5.0)
Alkaline Phosphatase: 64 U/L (ref 38–126)
Anion gap: 9 (ref 5–15)
BUN: 19 mg/dL (ref 8–23)
CO2: 23 mmol/L (ref 22–32)
Calcium: 8.8 mg/dL — ABNORMAL LOW (ref 8.9–10.3)
Chloride: 103 mmol/L (ref 98–111)
Creatinine, Ser: 0.9 mg/dL (ref 0.61–1.24)
GFR calc Af Amer: >60 mL/min (ref >60)
GFR calc non Af Amer: >60 mL/min (ref >60)
Glucose, Bld: 104 mg/dL — ABNORMAL HIGH (ref 70–99)
Potassium: 3.9 mmol/L (ref 3.5–5.1)
Sodium: 135 mmol/L (ref 135–145)
Total Bilirubin: 0.7 mg/dL (ref 0.3–1.2)
Total Protein: 6.5 g/dL (ref 6.5–8.1)

## 2019-09-03 LAB — CBC WITH DIFFERENTIAL/PLATELET
Abs Immature Granulocytes: 0.43 10*3/uL — ABNORMAL HIGH (ref 0.00–0.07)
Basophils Absolute: 0.1 10*3/uL (ref 0.0–0.1)
Basophils Relative: 1 %
Eosinophils Absolute: 0.1 10*3/uL (ref 0.0–0.5)
Eosinophils Relative: 1 %
HCT: 31.2 % — ABNORMAL LOW (ref 39.0–52.0)
Hemoglobin: 10.3 g/dL — ABNORMAL LOW (ref 13.0–17.0)
Immature Granulocytes: 5 %
Lymphocytes Relative: 8 %
Lymphs Abs: 0.7 10*3/uL (ref 0.7–4.0)
MCH: 29.1 pg (ref 26.0–34.0)
MCHC: 33 g/dL (ref 30.0–36.0)
MCV: 88.1 fL (ref 80.0–100.0)
Monocytes Absolute: 1.2 10*3/uL — ABNORMAL HIGH (ref 0.1–1.0)
Monocytes Relative: 12 %
Neutro Abs: 7.1 10*3/uL (ref 1.7–7.7)
Neutrophils Relative %: 73 %
Platelets: 252 10*3/uL (ref 150–400)
RBC: 3.54 MIL/uL — ABNORMAL LOW (ref 4.22–5.81)
RDW: 17.2 % — ABNORMAL HIGH (ref 11.5–15.5)
WBC: 9.5 10*3/uL (ref 4.0–10.5)
nRBC: 0.2 % (ref 0.0–0.2)

## 2019-09-03 LAB — LACTATE DEHYDROGENASE: LDH: 274 U/L — ABNORMAL HIGH (ref 98–192)

## 2019-09-03 MED ORDER — SODIUM CHLORIDE 0.9% FLUSH
10.0000 mL | Freq: Once | INTRAVENOUS | Status: AC
  Administered 2019-09-03: 10 mL via INTRAVENOUS
  Filled 2019-09-03: qty 10

## 2019-09-03 MED ORDER — PALONOSETRON HCL INJECTION 0.25 MG/5ML
0.2500 mg | Freq: Once | INTRAVENOUS | Status: AC
  Administered 2019-09-03: 0.25 mg via INTRAVENOUS
  Filled 2019-09-03: qty 5

## 2019-09-03 MED ORDER — SODIUM CHLORIDE 0.9 % IV SOLN
Freq: Once | INTRAVENOUS | Status: AC
  Administered 2019-09-03: 10:00:00 via INTRAVENOUS
  Filled 2019-09-03: qty 5

## 2019-09-03 MED ORDER — SODIUM CHLORIDE 0.9 % IV SOLN
50.0000 mg/m2 | Freq: Once | INTRAVENOUS | Status: AC
  Administered 2019-09-03: 130 mg via INTRAVENOUS
  Filled 2019-09-03: qty 6.5

## 2019-09-03 MED ORDER — SODIUM CHLORIDE 0.9 % IV SOLN
375.0000 mg/m2 | Freq: Once | INTRAVENOUS | Status: AC
  Administered 2019-09-03: 900 mg via INTRAVENOUS
  Filled 2019-09-03: qty 50

## 2019-09-03 MED ORDER — SODIUM CHLORIDE 0.9 % IV SOLN
Freq: Once | INTRAVENOUS | Status: AC
  Administered 2019-09-03: 10:00:00 via INTRAVENOUS
  Filled 2019-09-03: qty 250

## 2019-09-03 MED ORDER — SODIUM CHLORIDE 0.9 % IV SOLN
750.0000 mg/m2 | Freq: Once | INTRAVENOUS | Status: AC
  Administered 2019-09-03: 1900 mg via INTRAVENOUS
  Filled 2019-09-03: qty 95

## 2019-09-03 MED ORDER — ACETAMINOPHEN 325 MG PO TABS
650.0000 mg | ORAL_TABLET | Freq: Once | ORAL | Status: AC
  Administered 2019-09-03: 650 mg via ORAL
  Filled 2019-09-03: qty 2

## 2019-09-03 MED ORDER — VINCRISTINE SULFATE CHEMO INJECTION 1 MG/ML
2.0000 mg | Freq: Once | INTRAVENOUS | Status: AC
  Administered 2019-09-03: 2 mg via INTRAVENOUS
  Filled 2019-09-03: qty 2

## 2019-09-03 MED ORDER — DIPHENHYDRAMINE HCL 25 MG PO CAPS
50.0000 mg | ORAL_CAPSULE | Freq: Once | ORAL | Status: AC
  Administered 2019-09-03: 50 mg via ORAL
  Filled 2019-09-03: qty 2

## 2019-09-03 MED ORDER — HEPARIN SOD (PORK) LOCK FLUSH 100 UNIT/ML IV SOLN
500.0000 [IU] | Freq: Once | INTRAVENOUS | Status: AC | PRN
  Administered 2019-09-03: 500 [IU]
  Filled 2019-09-03 (×2): qty 5

## 2019-09-03 NOTE — Progress Notes (Signed)
[facility] NOTE  Patient Care Team: [name], MD as PCP - General (Family Medicine) [name] [name], MD (General Surgery)  CHIEF COMPLAINTS/PURPOSE OF CONSULTATION: Diffuse large B-cell lymphoma  #  Oncology History Overview Note  #June 29, 2019-CT- bulky bilateral neck adenopathy left more than right; September 2020-Diffuse B-cell lymphoma; ABC subtype; NEGATIVE FISH studies.  PET scan -bulky adenopathy involving neck /lymphadenopathy mediastinum /abdomen/pelvis inguinal lymph nodes; splenic involvement.  Question bone.  Stage IV [spleen involvement; no bone marrow biopsy].  BCL-2: Positive /BCL 6: Positive /MUM-1: Positive /CD10: Negative 'Ki-67: High proliferation index (90%) /CD30: Negative /Cyclin D1: Negative C-MYC: Equivocal (20% staining seen on limited and fragmented tissue) /EBER: Negative   # Ritux-Pred- 9/11; 9/18- R-COP [held adria- sec to EF 45-50%]  # Sep 2020- Borderline low EF/Left ventricular hypokinesis [Dr.Gollan]-s October 9th stress test-nonischemic;   # DIAGNOSIS: DLBCL  STAGE:   4      ;GOALS: Cure  CURRENT/MOST RECENT THERAPY : R-COP/-R-CEOP.     Lymphoma of lymph nodes ([place])  07/03/2019 Initial Diagnosis   Lymphoma of lymph nodes ([place])   07/13/2019 -  Chemotherapy   The patient had palonosetron (ALOXI) injection 0.25 mg, 0.25 mg, Intravenous,  Once, 3 of 6 cycles Administration: 0.25 mg (07/20/2019), 0.25 mg (08/13/2019), 0.25 mg (09/03/2019) pegfilgrastim-cbqv (UDENYCA) injection 6 mg, 6 mg, Subcutaneous, Once, 3 of 6 cycles Administration: 6 mg (07/23/2019), 6 mg (08/16/2019) vinCRIStine (ONCOVIN) 2 mg in sodium chloride 0.9 % 50 mL chemo infusion, 2 mg, Intravenous,  Once, 3 of 6 cycles Administration: 2 mg (07/20/2019), 2 mg (08/13/2019), 2 mg (09/03/2019) cyclophosphamide (CYTOXAN) 1,900 mg in sodium chloride 0.9 % 250 mL chemo infusion, 750 mg/m2 = 1,900 mg, Intravenous,  Once, 3 of 6 cycles Administration: 1,900 mg  (07/20/2019), 1,900 mg (08/13/2019), 1,900 mg (09/03/2019) etoposide (VEPESID) 250 mg in sodium chloride 0.9 % 1,000 mL chemo infusion, 100 mg/m2 = 250 mg, Intravenous,  Once, 2 of 5 cycles Dose modification: 50 mg/m2 (original dose 100 mg/m2, Cycle 3, Reason: Provider Judgment) Administration: 130 mg (08/14/2019), 130 mg (08/15/2019), 130 mg (09/03/2019) dexamethasone (DECADRON) 12 mg in sodium chloride 0.9 % 145 mL IVPB, , Intravenous,  Once, 4 of 7 cycles Administration:  (07/13/2019),  (07/20/2019),  (08/13/2019),  (09/03/2019) riTUXimab-pvvr (RUXIENCE) 900 mg in sodium chloride 0.9 % 250 mL (2.6471 mg/mL) infusion, 375 mg/m2 = 900 mg (100 % of original dose 375 mg/m2), Intravenous,  Once, 1 of 1 cycle Dose modification: 375 mg/m2 (original dose 375 mg/m2, Cycle 1) Administration: 900 mg (07/13/2019)  for chemotherapy treatment.      HISTORY OF PRESENTING ILLNESS:  Eric [name] 68 y.o.  male with recently due for lab B-cell lymphoma is currently status post R-COP 3 weeks ago is here for follow-up/proceed with chemotherapy.  Patient follows up with Dr.Brahmanday, I am covering him to see this patient. Patient has reduced LVEF and chemotherapy regimen was switch to R CEOP Patient reports that he had drug rash and allopurinol was stopped.  Reports that at that time he took Benadryl and Pepcid and steroids for rash. Currently he is not taking any of his prescribed medication including Coreg and losartan.  He feels that he may also developed reaction to them. He has noticed significant improvement of his neck swelling.  Fatigue is at baseline.  Denies any new pain Reports that he has increased belching.   Review of Systems  Constitutional: Positive for malaise/fatigue. Negative for chills, diaphoresis, fever and weight  loss.  HENT: Negative for nosebleeds and sore throat.   Eyes: Negative for double vision and redness.  Respiratory: Positive for cough and shortness of breath. Negative for  hemoptysis, sputum production and wheezing.   Cardiovascular: Negative for chest pain, palpitations, orthopnea and leg swelling.  Gastrointestinal: Negative for abdominal pain, blood in stool, constipation, diarrhea, heartburn, melena, nausea and vomiting.  Genitourinary: Negative for dysuria, frequency and urgency.  Musculoskeletal: Negative for back pain, joint pain and myalgias.  Skin: Negative for itching and rash.  Neurological: Negative for dizziness, tingling, tremors, focal weakness, weakness and headaches.  Endo/Heme/Allergies: Does not bruise/bleed easily.  Psychiatric/Behavioral: Negative for depression and hallucinations. The patient is not nervous/anxious and does not have insomnia.      MEDICAL HISTORY:  Past Medical History:  Diagnosis Date  . Colon polyps   . Pancreatitis   . Rectal bleeding 02/24/14    SURGICAL HISTORY: Past Surgical History:  Procedure Laterality Date  . CHOLECYSTECTOMY    . COLONOSCOPY  02/28/14  . IR IMAGING GUIDED PORT INSERTION  07/18/2019  . POLYPECTOMY     colon polyp removed  . TONSILLECTOMY      SOCIAL HISTORY: Social History   Socioeconomic History  . Marital status: Married    Spouse name: Not on file  . Number of children: Not on file  . Years of education: Not on file  . Highest education level: Not on file  Occupational History    Comment: Retired from [employer]  . Financial resource strain: Not on file  . Food insecurity    Worry: Not on file    Inability: Not on file  . Transportation needs    Medical: Not on file    Non-medical: Not on file  Tobacco Use  . Smoking status: Current Every Day Smoker    Packs/day: 1.00    Years: 20.00    Pack years: 20.00    Types: Cigars  . Smokeless tobacco: Never Used  . Tobacco comment: smoke 1/2 ppd cigaretes since 68 yo. changed to cigars at age July 2004.   Substance and Sexual Activity  . Alcohol use: Yes    Comment: 6-7 drinks weekly  . Drug use: No  . Sexual  activity: Not on file  Lifestyle  . Physical activity    Days per week: Not on file    Minutes per session: Not on file  . Stress: Not on file  Relationships  . Social Herbalist on phone: Not on file    Gets together: Not on file    Attends religious service: Not on file    Active member of club or organization: Not on file    Attends meetings of clubs or organizations: Not on file    Relationship status: Not on file  . Intimate partner violence    Fear of current or ex partner: Not on file    Emotionally abused: Not on file    Physically abused: Not on file    Forced sexual activity: Not on file  Other Topics Concern  . Not on file  Social History Narrative   Lives at home with wife; in [place]. retd from [employer] smoker; drinks liquor. Children - grown up.     FAMILY HISTORY: Family History  Adopted: Yes    ALLERGIES:  is allergic to azithromycin.  MEDICATIONS:  Current Outpatient Medications  Medication Sig Dispense Refill  . diphenhydrAMINE (BENADRYL) 50 MG tablet Take 1 tablet (50  mg total) by mouth every 8 (eight) hours. 30 tablet 0  . lidocaine-prilocaine (EMLA) cream Apply generously on the port 30-45 prior to accessing the port. 30 g 0  . Multiple Vitamins-Minerals (MULTI-VITAMIN/MINERALS PO) Take 1 capsule by mouth.    . ondansetron (ZOFRAN) 8 MG tablet One pill every 8 hours as needed for nausea/vomitting. 40 tablet 1  . prochlorperazine (COMPAZINE) 10 MG tablet Take 1 tablet (10 mg total) by mouth every 6 (six) hours as needed for nausea or vomiting. 40 tablet 1  . allopurinol (ZYLOPRIM) 300 MG tablet Take 1 tablet (300 mg total) by mouth 2 (two) times daily. (Patient not taking: Reported on 09/03/2019) 120 tablet 0  . carvedilol (COREG) 6.25 MG tablet Take 1 tablet (6.25 mg total) by mouth 2 (two) times daily. (Patient not taking: Reported on 09/03/2019) 180 tablet 3  . famotidine (PEPCID) 20 MG tablet Take 1 tablet (20 mg total) by mouth 2  (two) times daily. (Patient not taking: Reported on 09/03/2019) 30 tablet 0  . losartan (COZAAR) 50 MG tablet Take 1 tablet (50 mg total) by mouth daily. (Patient not taking: Reported on 09/03/2019) 90 tablet 3  . predniSONE (DELTASONE) 50 MG tablet Take 2 tablets once day x 5 days. Take with food. (Patient not taking: Reported on 09/03/2019) 10 tablet 4  . predniSONE (DELTASONE) 50 MG tablet Take 2 tablets daily for 5 days. (Patient not taking: Reported on 09/03/2019) 10 tablet 0   No current facility-administered medications for this visit.       Marland Kitchen  PHYSICAL EXAMINATION: ECOG PERFORMANCE STATUS: 1 - Symptomatic but completely ambulatory  Vitals:   09/03/19 0902  BP: 111/68  Pulse: 92  Resp: 18  Temp: 99.3 F (37.4 C)   Filed Weights   09/03/19 0902  Weight: 272 lb 1.6 oz (123.4 kg)    Physical Exam  Constitutional: He is oriented to person, place, and time and well-developed, well-nourished, and in no distress. No distress.  HENT:  Head: Normocephalic and atraumatic.  Nose: Nose normal.  Mouth/Throat: Oropharynx is clear and moist. No oropharyngeal exudate.  Eyes: Pupils are equal, round, and reactive to light. EOM are normal. No scleral icterus.  Neck: Normal range of motion. Neck supple.  Bilateral 2 to 3 cm lymphadenopathy left more than right (improved).   Bilateral axillary/inguinal 1 centimeter/improved  Cardiovascular: Normal rate and regular rhythm.  No murmur heard. Pulmonary/Chest: Effort normal and breath sounds normal. No respiratory distress. He has no wheezes. He has no rales. He exhibits no tenderness.  Abdominal: Soft. Bowel sounds are normal. He exhibits no distension and no mass. There is no abdominal tenderness. There is no rebound and no guarding.  Musculoskeletal: Normal range of motion.        General: No tenderness or edema.  Neurological: He is alert and oriented to person, place, and time. No cranial nerve deficit. He exhibits normal muscle tone.  Coordination normal.  Skin: Skin is warm and dry. He is not diaphoretic. No erythema.  Psychiatric: Affect normal.     LABORATORY DATA:  I have reviewed the data as listed Lab Results  Component Value Date   WBC 9.5 09/03/2019   HGB 10.3 (L) 09/03/2019   HCT 31.2 (L) 09/03/2019   MCV 88.1 09/03/2019   PLT 252 09/03/2019   Recent Labs    07/03/19 1539  07/20/19 0830 08/13/19 0815 09/03/19 0838  NA 138   < > 135 136 135  K 4.3   < >  4.2 4.0 3.9  CL 103   < > 99 101 103  CO2 30   < > _0 GLUCOSE 104*   < > 122* 103* 104*  BUN 28*   < > 27* 19 19  CREATININE 1.22   < > 1.07 0.92 0.90  CALCIUM 9.6   < > 8.9 8.9 8.8*  GFRNONAA >60   < > >60 >60 >60  GFRAA >60   < > >60 >60 >60  PROT 6.9  --   --  6.9 6.5  ALBUMIN 3.8  --   --  3.5 3.4*  AST 25  --   --  18 22  ALT 23  --   --  20 23  ALKPHOS 75  --   --  77 64  BILITOT 2.1*  --   --  0.7 0.7   < > = values in this interval not displayed.    RADIOGRAPHIC STUDIES: I have personally reviewed the radiological images as listed and agreed with the findings in the report. Nm Cardiac Muga Rest  Result Date: 08/21/2019 CLINICAL DATA:  High-grade lymphoma.  Dilated cardiomyopathy. EXAM: NUCLEAR MEDICINE CARDIAC BLOOD POOL IMAGING (MUGA) TECHNIQUE: Cardiac multi-gated acquisition was performed at rest following intravenous injection of Tc-38mlabeled red blood cells. RADIOPHARMACEUTICALS:  24.0 mCi Tc-945mertechnetate in-vitro labeled red blood cells IV COMPARISON:  Myocardial perfusion exam 08/10/2019, PET-CT scan 07/10/2019 FINDINGS: No  focal wall motion abnormality of the left ventricle. Calculated left ventricular ejection fraction equals 39.8% IMPRESSION: Left ventricular ejection fraction equals39.8 %. Electronically Signed   By: StSuzy Bouchard.D.   On: 08/21/2019 12:16   Nm Myocar Multi W/spect W/wall Motion / Ef  Result Date: 08/10/2019 Pharmacological myocardial perfusion imaging study with no significant   Ischemia Mild attenuation artifact likely secondary to body habitus Non-attenuation corrected images with diaphragmatic attenuation artifact Mild global hypokinesis, EF estimated at 36% (depressed ejection fraction likely secondary to GI uptake artifact) No EKG changes concerning for ischemia at peak stress or in recovery. CT attenuation corrected images with coronary calcification in the RCA, left circumflex Low risk scan Signed, TiEsmond PlantsMD, Ph.D [facility]    ASSESSMENT & PLAN:  1. Diffuse large B-cell lymphoma, unspecified body region ([facility]  2. Dilated cardiomyopathy ([facility] [place]  3. Encounter for antineoplastic chemotherapy   4. Belching symptom    #Diffuse large B-cell lymphoma Had good clinical response on chemotherapy treatment.  Status post R CEOP 3 weeks ago Reports tolerating well. Labs reviewed and discussed with patient.  Counts are okay to proceed with RCEOP today.  Patient will have etoposide on day 2 in a day 3.  GCSF  will be given on day 4.  #Dilated cardiomyopathy, was recommended by cardiology to start Coreg and losartan.  Patient is currently not taking any of his oral medications.  He thought he had allergic reaction when he was taking allopurinol/Coreg/losartan. Discussed with Dr.Brahmanday. Allopurinol is likely the culprit. Resume coreg and losartan one at a time.  I called patent's home and updated Mrs.Segar. Advise pt to resume Coreg 6.2567mID.   #Belching, ? Gastritis/GERD. Recommend patient to try famotidine 53m58mily.  All questions were answered. The patient knows to call the clinic with any problems, questions or concerns. Follow up with Dr.B in 3 weeks for Lab MD RCEOP    ZhouEarlie Server 09/03/2019 4:51 PM

## 2019-09-03 NOTE — Progress Notes (Signed)
Patient has stopped most of his prescribed meds due to hives.  Was evaluated by NP in our office.  The hives have improved.  Patient reports having an increase in belching.

## 2019-09-04 ENCOUNTER — Other Ambulatory Visit: Payer: Self-pay

## 2019-09-04 ENCOUNTER — Inpatient Hospital Stay: Payer: Medicare Other

## 2019-09-04 VITALS — BP 111/69 | HR 76 | Resp 18

## 2019-09-04 DIAGNOSIS — C8338 Diffuse large B-cell lymphoma, lymph nodes of multiple sites: Secondary | ICD-10-CM | POA: Diagnosis not present

## 2019-09-04 DIAGNOSIS — R142 Eructation: Secondary | ICD-10-CM | POA: Diagnosis not present

## 2019-09-04 DIAGNOSIS — Z5189 Encounter for other specified aftercare: Secondary | ICD-10-CM | POA: Diagnosis not present

## 2019-09-04 DIAGNOSIS — C8590 Non-Hodgkin lymphoma, unspecified, unspecified site: Secondary | ICD-10-CM

## 2019-09-04 DIAGNOSIS — Z5111 Encounter for antineoplastic chemotherapy: Secondary | ICD-10-CM | POA: Diagnosis not present

## 2019-09-04 DIAGNOSIS — Z5112 Encounter for antineoplastic immunotherapy: Secondary | ICD-10-CM | POA: Diagnosis not present

## 2019-09-04 DIAGNOSIS — I420 Dilated cardiomyopathy: Secondary | ICD-10-CM | POA: Diagnosis not present

## 2019-09-04 MED ORDER — DEXAMETHASONE SODIUM PHOSPHATE 10 MG/ML IJ SOLN
10.0000 mg | Freq: Once | INTRAMUSCULAR | Status: AC
  Administered 2019-09-04: 10 mg via INTRAVENOUS
  Filled 2019-09-04: qty 1

## 2019-09-04 MED ORDER — SODIUM CHLORIDE 0.9 % IV SOLN
INTRAVENOUS | Status: DC
  Administered 2019-09-04: 09:00:00 via INTRAVENOUS
  Filled 2019-09-04: qty 250

## 2019-09-04 MED ORDER — SODIUM CHLORIDE 0.9 % IV SOLN
50.0000 mg/m2 | Freq: Once | INTRAVENOUS | Status: AC
  Administered 2019-09-04: 130 mg via INTRAVENOUS
  Filled 2019-09-04: qty 6.5

## 2019-09-04 MED ORDER — HEPARIN SOD (PORK) LOCK FLUSH 100 UNIT/ML IV SOLN
INTRAVENOUS | Status: AC
  Filled 2019-09-04: qty 5

## 2019-09-04 MED ORDER — HEPARIN SOD (PORK) LOCK FLUSH 100 UNIT/ML IV SOLN
500.0000 [IU] | Freq: Once | INTRAVENOUS | Status: AC
  Administered 2019-09-04: 500 [IU] via INTRAVENOUS

## 2019-09-05 ENCOUNTER — Inpatient Hospital Stay: Payer: Medicare Other

## 2019-09-05 ENCOUNTER — Other Ambulatory Visit: Payer: Self-pay

## 2019-09-05 VITALS — BP 133/76 | HR 58 | Temp 96.4°F | Resp 18

## 2019-09-05 DIAGNOSIS — R142 Eructation: Secondary | ICD-10-CM | POA: Diagnosis not present

## 2019-09-05 DIAGNOSIS — Z5112 Encounter for antineoplastic immunotherapy: Secondary | ICD-10-CM | POA: Diagnosis not present

## 2019-09-05 DIAGNOSIS — Z5111 Encounter for antineoplastic chemotherapy: Secondary | ICD-10-CM | POA: Diagnosis not present

## 2019-09-05 DIAGNOSIS — C8338 Diffuse large B-cell lymphoma, lymph nodes of multiple sites: Secondary | ICD-10-CM | POA: Diagnosis not present

## 2019-09-05 DIAGNOSIS — Z5189 Encounter for other specified aftercare: Secondary | ICD-10-CM | POA: Diagnosis not present

## 2019-09-05 DIAGNOSIS — I420 Dilated cardiomyopathy: Secondary | ICD-10-CM | POA: Diagnosis not present

## 2019-09-05 DIAGNOSIS — C8590 Non-Hodgkin lymphoma, unspecified, unspecified site: Secondary | ICD-10-CM

## 2019-09-05 MED ORDER — SODIUM CHLORIDE 0.9 % IV SOLN
50.0000 mg/m2 | Freq: Once | INTRAVENOUS | Status: AC
  Administered 2019-09-05: 130 mg via INTRAVENOUS
  Filled 2019-09-05: qty 6.5

## 2019-09-05 MED ORDER — HEPARIN SOD (PORK) LOCK FLUSH 100 UNIT/ML IV SOLN
500.0000 [IU] | Freq: Once | INTRAVENOUS | Status: AC
  Administered 2019-09-05: 500 [IU] via INTRAVENOUS
  Filled 2019-09-05: qty 5

## 2019-09-05 MED ORDER — DEXAMETHASONE SODIUM PHOSPHATE 10 MG/ML IJ SOLN
10.0000 mg | Freq: Once | INTRAMUSCULAR | Status: AC
  Administered 2019-09-05: 10 mg via INTRAVENOUS
  Filled 2019-09-05: qty 1

## 2019-09-05 MED ORDER — SODIUM CHLORIDE 0.9 % IV SOLN
Freq: Once | INTRAVENOUS | Status: AC
  Administered 2019-09-05: 10:00:00 via INTRAVENOUS
  Filled 2019-09-05: qty 250

## 2019-09-06 ENCOUNTER — Inpatient Hospital Stay: Payer: Medicare Other

## 2019-09-06 ENCOUNTER — Other Ambulatory Visit: Payer: Self-pay

## 2019-09-06 DIAGNOSIS — Z5111 Encounter for antineoplastic chemotherapy: Secondary | ICD-10-CM | POA: Diagnosis not present

## 2019-09-06 DIAGNOSIS — R142 Eructation: Secondary | ICD-10-CM | POA: Diagnosis not present

## 2019-09-06 DIAGNOSIS — Z5112 Encounter for antineoplastic immunotherapy: Secondary | ICD-10-CM | POA: Diagnosis not present

## 2019-09-06 DIAGNOSIS — Z5189 Encounter for other specified aftercare: Secondary | ICD-10-CM | POA: Diagnosis not present

## 2019-09-06 DIAGNOSIS — C8338 Diffuse large B-cell lymphoma, lymph nodes of multiple sites: Secondary | ICD-10-CM | POA: Diagnosis not present

## 2019-09-06 DIAGNOSIS — I420 Dilated cardiomyopathy: Secondary | ICD-10-CM | POA: Diagnosis not present

## 2019-09-06 DIAGNOSIS — C8590 Non-Hodgkin lymphoma, unspecified, unspecified site: Secondary | ICD-10-CM

## 2019-09-06 MED ORDER — PEGFILGRASTIM-CBQV 6 MG/0.6ML ~~LOC~~ SOSY
6.0000 mg | PREFILLED_SYRINGE | Freq: Once | SUBCUTANEOUS | Status: AC
  Administered 2019-09-06: 6 mg via SUBCUTANEOUS
  Filled 2019-09-06: qty 0.6

## 2019-09-07 ENCOUNTER — Encounter: Payer: Self-pay | Admitting: Internal Medicine

## 2019-09-07 NOTE — Telephone Encounter (Signed)
Spoke to patient regarding slightly elevated LDH.  Patient currently status post 2 cycles of  R-CEOP. pt clinically improved.  Monitor LDH for now.   We will plan reimaging after 3 cycles of chemotherapy.  Patient agreement with the plan.

## 2019-09-18 ENCOUNTER — Inpatient Hospital Stay: Payer: Medicare Other | Admitting: Oncology

## 2019-09-18 ENCOUNTER — Other Ambulatory Visit: Payer: Self-pay

## 2019-09-18 ENCOUNTER — Inpatient Hospital Stay
Admission: EM | Admit: 2019-09-18 | Discharge: 2019-09-21 | DRG: 871 | Disposition: A | Discharge status: Home / Self Care | Payer: Medicare Other | Attending: Internal Medicine | Admitting: Internal Medicine

## 2019-09-18 ENCOUNTER — Emergency Department: Payer: Medicare Other

## 2019-09-18 ENCOUNTER — Encounter: Payer: Self-pay | Admitting: Emergency Medicine

## 2019-09-18 ENCOUNTER — Telehealth: Payer: Self-pay | Admitting: *Deleted

## 2019-09-18 ENCOUNTER — Other Ambulatory Visit: Payer: Self-pay | Admitting: Internal Medicine

## 2019-09-18 DIAGNOSIS — Z720 Tobacco use: Secondary | ICD-10-CM | POA: Diagnosis not present

## 2019-09-18 DIAGNOSIS — C8330 Diffuse large B-cell lymphoma, unspecified site: Secondary | ICD-10-CM | POA: Diagnosis not present

## 2019-09-18 DIAGNOSIS — N200 Calculus of kidney: Secondary | ICD-10-CM | POA: Diagnosis present

## 2019-09-18 DIAGNOSIS — K5730 Diverticulosis of large intestine without perforation or abscess without bleeding: Secondary | ICD-10-CM | POA: Diagnosis not present

## 2019-09-18 DIAGNOSIS — Z7901 Long term (current) use of anticoagulants: Secondary | ICD-10-CM | POA: Diagnosis not present

## 2019-09-18 DIAGNOSIS — J9601 Acute respiratory failure with hypoxia: Secondary | ICD-10-CM | POA: Diagnosis not present

## 2019-09-18 DIAGNOSIS — I420 Dilated cardiomyopathy: Secondary | ICD-10-CM | POA: Diagnosis not present

## 2019-09-18 DIAGNOSIS — Z20828 Contact with and (suspected) exposure to other viral communicable diseases: Secondary | ICD-10-CM | POA: Diagnosis present

## 2019-09-18 DIAGNOSIS — Z881 Allergy status to other antibiotic agents status: Secondary | ICD-10-CM

## 2019-09-18 DIAGNOSIS — I5020 Unspecified systolic (congestive) heart failure: Secondary | ICD-10-CM | POA: Diagnosis not present

## 2019-09-18 DIAGNOSIS — R0602 Shortness of breath: Secondary | ICD-10-CM | POA: Diagnosis not present

## 2019-09-18 DIAGNOSIS — Z79899 Other long term (current) drug therapy: Secondary | ICD-10-CM

## 2019-09-18 DIAGNOSIS — Z6836 Body mass index (BMI) 36.0-36.9, adult: Secondary | ICD-10-CM

## 2019-09-18 DIAGNOSIS — I110 Hypertensive heart disease with heart failure: Secondary | ICD-10-CM | POA: Diagnosis present

## 2019-09-18 DIAGNOSIS — J9811 Atelectasis: Secondary | ICD-10-CM | POA: Diagnosis present

## 2019-09-18 DIAGNOSIS — A419 Sepsis, unspecified organism: Principal | ICD-10-CM | POA: Diagnosis present

## 2019-09-18 DIAGNOSIS — I2699 Other pulmonary embolism without acute cor pulmonale: Secondary | ICD-10-CM | POA: Diagnosis present

## 2019-09-18 DIAGNOSIS — I2694 Multiple subsegmental pulmonary emboli without acute cor pulmonale: Secondary | ICD-10-CM | POA: Diagnosis not present

## 2019-09-18 DIAGNOSIS — R1012 Left upper quadrant pain: Secondary | ICD-10-CM | POA: Diagnosis not present

## 2019-09-18 DIAGNOSIS — D6481 Anemia due to antineoplastic chemotherapy: Secondary | ICD-10-CM | POA: Diagnosis not present

## 2019-09-18 DIAGNOSIS — I82431 Acute embolism and thrombosis of right popliteal vein: Secondary | ICD-10-CM | POA: Diagnosis not present

## 2019-09-18 DIAGNOSIS — F17290 Nicotine dependence, other tobacco product, uncomplicated: Secondary | ICD-10-CM | POA: Diagnosis present

## 2019-09-18 DIAGNOSIS — R509 Fever, unspecified: Secondary | ICD-10-CM | POA: Diagnosis not present

## 2019-09-18 DIAGNOSIS — R5383 Other fatigue: Secondary | ICD-10-CM | POA: Diagnosis not present

## 2019-09-18 DIAGNOSIS — C8590 Non-Hodgkin lymphoma, unspecified, unspecified site: Secondary | ICD-10-CM

## 2019-09-18 DIAGNOSIS — R079 Chest pain, unspecified: Secondary | ICD-10-CM | POA: Diagnosis not present

## 2019-09-18 HISTORY — DX: Sepsis, unspecified organism: A41.9

## 2019-09-18 LAB — CBC WITH DIFFERENTIAL/PLATELET
Abs Immature Granulocytes: 1.31 10*3/uL — ABNORMAL HIGH (ref 0.00–0.07)
Basophils Absolute: 0.1 10*3/uL (ref 0.0–0.1)
Basophils Relative: 0 %
Eosinophils Absolute: 0.1 10*3/uL (ref 0.0–0.5)
Eosinophils Relative: 0 %
HCT: 30.7 % — ABNORMAL LOW (ref 39.0–52.0)
Hemoglobin: 10.1 g/dL — ABNORMAL LOW (ref 13.0–17.0)
Immature Granulocytes: 9 %
Lymphocytes Relative: 6 %
Lymphs Abs: 0.9 10*3/uL (ref 0.7–4.0)
MCH: 27.6 pg (ref 26.0–34.0)
MCHC: 32.9 g/dL (ref 30.0–36.0)
MCV: 83.9 fL (ref 80.0–100.0)
Monocytes Absolute: 1.3 10*3/uL — ABNORMAL HIGH (ref 0.1–1.0)
Monocytes Relative: 9 %
Neutro Abs: 10.4 10*3/uL — ABNORMAL HIGH (ref 1.7–7.7)
Neutrophils Relative %: 76 %
Platelets: 244 10*3/uL (ref 150–400)
RBC: 3.66 MIL/uL — ABNORMAL LOW (ref 4.22–5.81)
RDW: 18.7 % — ABNORMAL HIGH (ref 11.5–15.5)
Smear Review: NORMAL
WBC: 14 10*3/uL — ABNORMAL HIGH (ref 4.0–10.5)
nRBC: 0.3 % — ABNORMAL HIGH (ref 0.0–0.2)

## 2019-09-18 LAB — TROPONIN I (HIGH SENSITIVITY)
Troponin I (High Sensitivity): 12 ng/L (ref <18)
Troponin I (High Sensitivity): 10 ng/L (ref <18)
Troponin I (High Sensitivity): 10 ng/L (ref <18)

## 2019-09-18 LAB — PROTIME-INR
INR: 1.2 (ref 0.8–1.2)
Prothrombin Time: 15.5 seconds — ABNORMAL HIGH (ref 11.4–15.2)

## 2019-09-18 LAB — URINALYSIS, ROUTINE W REFLEX MICROSCOPIC
Bacteria, UA: NONE SEEN
Bilirubin Urine: NEGATIVE
Glucose, UA: NEGATIVE mg/dL
Hgb urine dipstick: NEGATIVE
Ketones, ur: NEGATIVE mg/dL
Leukocytes,Ua: NEGATIVE
Nitrite: NEGATIVE
Protein, ur: 30 mg/dL — AB
Specific Gravity, Urine: >1.046 — ABNORMAL HIGH (ref 1.005–1.030)
pH: 5 (ref 5.0–8.0)

## 2019-09-18 LAB — COMPREHENSIVE METABOLIC PANEL
ALT: 32 U/L (ref 0–44)
AST: 29 U/L (ref 15–41)
Albumin: 3.4 g/dL — ABNORMAL LOW (ref 3.5–5.0)
Alkaline Phosphatase: 71 U/L (ref 38–126)
Anion gap: 8 (ref 5–15)
BUN: 20 mg/dL (ref 8–23)
CO2: 23 mmol/L (ref 22–32)
Calcium: 8.8 mg/dL — ABNORMAL LOW (ref 8.9–10.3)
Chloride: 105 mmol/L (ref 98–111)
Creatinine, Ser: 0.94 mg/dL (ref 0.61–1.24)
GFR calc Af Amer: >60 mL/min (ref >60)
GFR calc non Af Amer: >60 mL/min (ref >60)
Glucose, Bld: 120 mg/dL — ABNORMAL HIGH (ref 70–99)
Potassium: 4 mmol/L (ref 3.5–5.1)
Sodium: 136 mmol/L (ref 135–145)
Total Bilirubin: 1.3 mg/dL — ABNORMAL HIGH (ref 0.3–1.2)
Total Protein: 6.6 g/dL (ref 6.5–8.1)

## 2019-09-18 LAB — C-REACTIVE PROTEIN: CRP: 19.7 mg/dL — ABNORMAL HIGH (ref <1.0)

## 2019-09-18 LAB — LACTIC ACID, PLASMA: Lactic Acid, Venous: 1.3 mmol/L (ref 0.5–1.9)

## 2019-09-18 LAB — BRAIN NATRIURETIC PEPTIDE: B Natriuretic Peptide: 43 pg/mL (ref 0.0–100.0)

## 2019-09-18 LAB — FIBRINOGEN: Fibrinogen: 564 mg/dL — ABNORMAL HIGH (ref 210–475)

## 2019-09-18 LAB — APTT: aPTT: 37 seconds — ABNORMAL HIGH (ref 24–36)

## 2019-09-18 LAB — PROCALCITONIN: Procalcitonin: 0.16 ng/mL

## 2019-09-18 LAB — LIPASE, BLOOD: Lipase: 20 U/L (ref 11–51)

## 2019-09-18 LAB — LACTATE DEHYDROGENASE: LDH: 346 U/L — ABNORMAL HIGH (ref 98–192)

## 2019-09-18 LAB — FERRITIN: Ferritin: 824 ng/mL — ABNORMAL HIGH (ref 24–336)

## 2019-09-18 MED ORDER — METRONIDAZOLE IN NACL 5-0.79 MG/ML-% IV SOLN
500.0000 mg | Freq: Once | INTRAVENOUS | Status: AC
  Administered 2019-09-18: 500 mg via INTRAVENOUS
  Filled 2019-09-18: qty 100

## 2019-09-18 MED ORDER — ACETAMINOPHEN 500 MG PO TABS
1000.0000 mg | ORAL_TABLET | Freq: Once | ORAL | Status: AC
  Administered 2019-09-18: 1000 mg via ORAL
  Filled 2019-09-18: qty 2

## 2019-09-18 MED ORDER — SODIUM CHLORIDE 0.9 % IV SOLN
Freq: Once | INTRAVENOUS | Status: AC
  Administered 2019-09-18: 19:00:00 via INTRAVENOUS

## 2019-09-18 MED ORDER — HYDROMORPHONE HCL 1 MG/ML IJ SOLN
0.5000 mg | Freq: Once | INTRAMUSCULAR | Status: AC
  Administered 2019-09-18: 0.5 mg via INTRAVENOUS
  Filled 2019-09-18: qty 1

## 2019-09-18 MED ORDER — VANCOMYCIN HCL IN DEXTROSE 1-5 GM/200ML-% IV SOLN
1000.0000 mg | Freq: Once | INTRAVENOUS | Status: AC
  Administered 2019-09-18: 1000 mg via INTRAVENOUS
  Filled 2019-09-18: qty 200

## 2019-09-18 MED ORDER — SODIUM CHLORIDE 0.9 % IV BOLUS
1000.0000 mL | Freq: Once | INTRAVENOUS | Status: AC
  Administered 2019-09-18: 1000 mL via INTRAVENOUS

## 2019-09-18 MED ORDER — IOHEXOL 350 MG/ML SOLN
75.0000 mL | Freq: Once | INTRAVENOUS | Status: AC | PRN
  Administered 2019-09-18: 75 mL via INTRAVENOUS

## 2019-09-18 MED ORDER — SODIUM CHLORIDE 0.9 % IV BOLUS
500.0000 mL | Freq: Once | INTRAVENOUS | Status: AC
  Administered 2019-09-18: 500 mL via INTRAVENOUS

## 2019-09-18 MED ORDER — HEPARIN BOLUS VIA INFUSION
4000.0000 [IU] | Freq: Once | INTRAVENOUS | Status: AC
  Administered 2019-09-18: 4000 [IU] via INTRAVENOUS
  Filled 2019-09-18: qty 4000

## 2019-09-18 MED ORDER — HEPARIN (PORCINE) 25000 UT/250ML-% IV SOLN
2400.0000 [IU]/h | INTRAVENOUS | Status: DC
  Administered 2019-09-18 – 2019-09-19 (×2): 1950 [IU]/h via INTRAVENOUS
  Filled 2019-09-18 (×4): qty 250

## 2019-09-18 MED ORDER — HEPARIN SOD (PORK) LOCK FLUSH 100 UNIT/ML IV SOLN
INTRAVENOUS | Status: AC
  Filled 2019-09-18: qty 5

## 2019-09-18 MED ORDER — SODIUM CHLORIDE 0.9 % IV SOLN
2.0000 g | Freq: Once | INTRAVENOUS | Status: AC
  Administered 2019-09-18: 2 g via INTRAVENOUS
  Filled 2019-09-18: qty 2

## 2019-09-18 MED ORDER — ONDANSETRON HCL 4 MG/2ML IJ SOLN
4.0000 mg | Freq: Once | INTRAMUSCULAR | Status: AC
  Administered 2019-09-18: 4 mg via INTRAVENOUS
  Filled 2019-09-18: qty 2

## 2019-09-18 NOTE — Consult Note (Signed)
[facility] for Heparin Indication: pulmonary embolus  Allergies  Allergen Reactions  . Azithromycin Itching    Patient Measurements:   Heparin Dosing Weight:   Vital Signs: Temp: 103.2 F (39.6 C) (11/17 1439) Temp Source: Oral (11/17 1439) BP: 113/68 (11/17 1530) Pulse Rate: 101 (11/17 1530)  Labs: Recent Labs    09/18/19 1458  HGB 10.1*  HCT 30.7*  PLT 244  APTT 37*  LABPROT 15.5*  INR 1.2  CREATININE 0.94  TROPONINIHS 12    CrCl cannot be calculated (Unknown ideal weight.).   Medications:  No PTA anticoagulants, per patient   Assessment: Pharmacy has been consulted for heparin dosing in a patient with a PE.     Goal of Therapy:  Heparin level 0.3-0.7 units/ml Monitor platelets by anticoagulation protocol: Yes   Plan:  Baseline labs have been ordered  Heparin DW: 123.4 kg  Give 4000 units bolus x 1 Start heparin infusion at 1950 units/hr Check anti-Xa level in 6 hours and daily while on heparin, per protocol Continue to monitor H&H and platelets   Petrina Melby R Lineth Thielke 09/18/2019,5:21 PM

## 2019-09-18 NOTE — Telephone Encounter (Signed)
Per Dr. Rogue Bussing - Please have patient see [facility] NP today.

## 2019-09-18 NOTE — ED Notes (Signed)
Pt on 4 liters oxygen.  md aware.

## 2019-09-18 NOTE — ED Provider Notes (Signed)
Patient received in signout from Dr. Jari Pigg pending laboratory work-up as well as CT imaging.  CT imaging shows evidence of multiple pulmonary emboli with some right heart strain by imaging though troponin and BNP are normal.  Blood pressure stable systolics in the 1 teens.  Will heparinize.  Will consult to discuss with vascular surgery this patient will be a candidate for intervention.  Which I do think his presentation is complicated by sepsis and does have some peripheral opacities in the lungs which may be pneumonitis are suggestive of Covid and given the fever with presentation I think it must remain high on the differential.  6:00PM Case discussed in consultation with Dr. Delana Meyer vascular surgery.  Patient review of imaging with reassuring lab markers will plan for medical management at this time and reevaluation for possible intervention tomorrow.  Will be admitted to the hospitalist service for further medical management.  Patient update on plan.   Merlyn Lot, MD 09/18/19 Jeri Lager

## 2019-09-18 NOTE — ED Provider Notes (Signed)
[facility] Emergency Department Provider Note  ____________________________________________   First MD Initiated Contact with Patient 09/18/19 1504     (approximate)  I have reviewed the triage vital signs and the nursing notes.   HISTORY  Chief Complaint Fever   HPI Eric [name] is a 68 y.o. male with non-Hodgkin's lymphoma on chemotherapy who presents with shortness of breath and fevers.  Patient reports since yesterday having some pain on his left upper quadrant that feels like a muscle ache that is constant, nothing makes it better, nothing makes it worse, nonradiating, has been associate with some shortness of breath.  Also found to be febrile.  Patient also found to be hypoxic to 85%.  Patient does have a port.          Past Medical History:  Diagnosis Date  . Colon polyps   . Pancreatitis   . Rectal bleeding 02/24/14    Patient Active Problem List   Diagnosis Date Noted  . Dilated cardiomyopathy ([place]) 07/29/2019  . Lymphoma of lymph nodes ([place]) 07/03/2019  . Diverticulosis 03/25/2016  . History of colon polyps 03/25/2016  . Morbid obesity ([place]) 03/25/2016  . Compulsive tobacco user syndrome 03/25/2016  . Rectal bleeding 04/02/2014  . Dysplastic polyp of rectum 04/02/2014    Past Surgical History:  Procedure Laterality Date  . CHOLECYSTECTOMY    . COLONOSCOPY  02/28/14  . IR IMAGING GUIDED PORT INSERTION  07/18/2019  . POLYPECTOMY     colon polyp removed  . TONSILLECTOMY      Prior to Admission medications   Medication Sig Start Date End Date Taking? Authorizing Provider  carvedilol (COREG) 6.25 MG tablet Take 1 tablet (6.25 mg total) by mouth 2 (two) times daily. Patient not taking: Reported on 09/03/2019 07/30/19   Minna Merritts, MD  diphenhydrAMINE (BENADRYL) 50 MG tablet Take 1 tablet (50 mg total) by mouth every 8 (eight) hours. 08/03/19   Jacquelin Hawking, NP  famotidine (PEPCID) 20 MG tablet Take 1 tablet (20 mg  total) by mouth 2 (two) times daily. Patient not taking: Reported on 09/03/2019 08/03/19   Jacquelin Hawking, NP  lidocaine-prilocaine (EMLA) cream Apply generously on the port 30-45 prior to accessing the port. 07/11/19   Cammie Sickle, MD  losartan (COZAAR) 50 MG tablet Take 1 tablet (50 mg total) by mouth daily. Patient not taking: Reported on 09/03/2019 07/30/19 10/28/19  Minna Merritts, MD  Multiple Vitamins-Minerals (MULTI-VITAMIN/MINERALS PO) Take 1 capsule by mouth.    [provider]  ondansetron (ZOFRAN) 8 MG tablet One pill every 8 hours as needed for nausea/vomitting. 07/11/19   Cammie Sickle, MD  predniSONE (DELTASONE) 50 MG tablet Take 2 tablets once day x 5 days. Take with food. Patient not taking: Reported on 09/03/2019 07/11/19   Cammie Sickle, MD  predniSONE (DELTASONE) 50 MG tablet Take 2 tablets daily for 5 days. Patient not taking: Reported on 09/03/2019 08/03/19   Jacquelin Hawking, NP  prochlorperazine (COMPAZINE) 10 MG tablet Take 1 tablet (10 mg total) by mouth every 6 (six) hours as needed for nausea or vomiting. 07/11/19   Cammie Sickle, MD    Allergies Azithromycin  Family History  Adopted: Yes    Social History Social History   Tobacco Use  . Smoking status: Current Every Day Smoker    Packs/day: 1.00    Years: 20.00    Pack years: 20.00    Types: Cigars  . Smokeless tobacco:  Never Used  . Tobacco comment: smoke 1/2 ppd cigaretes since 68 yo. changed to cigars at age July 2004.   Substance Use Topics  . Alcohol use: Yes    Comment: 6-7 drinks weekly  . Drug use: No      Review of Systems Constitutional: Positive fever Eyes: No visual changes. ENT: No sore throat. Cardiovascular: Denies chest pain. Respiratory: Positive shortness of breath Gastrointestinal: Positive left upper quadrant abdominal pain no nausea, no vomiting.  No diarrhea.  No constipation. Genitourinary: Negative for dysuria. Musculoskeletal:  Negative for back pain. Skin: Negative for rash. Neurological: Negative for headaches, focal weakness or numbness. All other ROS negative ____________________________________________   PHYSICAL EXAM:  VITAL SIGNS: ED Triage Vitals  Enc Vitals Group     BP 09/18/19 1439 111/74     Pulse Rate 09/18/19 1439 (!) 107     Resp 09/18/19 1439 (!) 24     Temp 09/18/19 1439 (!) 103.2 F (39.6 C)     Temp Source 09/18/19 1439 Oral     SpO2 09/18/19 1439 94 %     Weight --      Height --      Head Circumference --      Peak Flow --      Pain Score 09/18/19 1437 5     Pain Loc --      Pain Edu? --      Excl. in [place]? --     Constitutional: Alert and oriented. Well appearing and in no acute distress. Eyes: Conjunctivae are normal. EOMI. Head: Atraumatic. Nose: No congestion/rhinnorhea. Mouth/Throat: Mucous membranes are moist.   Neck: No stridor. Trachea Midline. FROM Cardiovascular: Normal rate, regular rhythm. Grossly normal heart sounds.  Good peripheral circulation. Respiratory: Normal respiratory effort.  No retractions. Lungs CTAB.  On 2 L.  Port and chest wall Gastrointestinal: Soft and nontender. No distention. No abdominal bruits.  No rash noted Musculoskeletal: No lower extremity tenderness nor edema.  No joint effusions. Neurologic:  Normal speech and language. No gross focal neurologic deficits are appreciated.  Skin:  Skin is warm, dry and intact. No rash noted. Psychiatric: Mood and affect are normal. Speech and behavior are normal. GU: Deferred   ____________________________________________   LABS (all labs ordered are listed, but only abnormal results are displayed)  Labs Reviewed  CULTURE, BLOOD (ROUTINE X 2)  CULTURE, BLOOD (ROUTINE X 2)  URINE CULTURE  SARS CORONAVIRUS 2 (TAT 6-24 HRS)  LACTIC ACID, PLASMA  LACTIC ACID, PLASMA  COMPREHENSIVE METABOLIC PANEL  CBC WITH DIFFERENTIAL/PLATELET  APTT  PROTIME-INR  URINALYSIS, ROUTINE W REFLEX MICROSCOPIC   BRAIN NATRIURETIC PEPTIDE  PROCALCITONIN  LIPASE, BLOOD  TROPONIN I (HIGH SENSITIVITY)   ____________________________________________   ED ECG REPORT I, Vanessa [place], the attending physician, personally viewed and interpreted this ECG.  EKG is sinus tachycardia rate of 107, no ST elevation, no T wave inversions, normal intervals ____________________________________________  RADIOLOGY Robert Bellow, personally viewed and evaluated these images (plain radiographs) as part of my medical decision making, as well as reviewing the written report by the radiologist.  ED MD interpretation: Small pleural effusions bilaterally.  Official radiology report(s): Dg Chest Port 1 View  Result Date: 09/18/2019 CLINICAL DATA:  Shortness of breath EXAM: PORTABLE CHEST 1 VIEW COMPARISON:  None. FINDINGS: There is mild cardiomegaly. There is mild prominence to the central pulmonary vasculature. There is blunting of the bilateral costophrenic angles which could be small bilateral pleural effusions, right greater than left.  A right-sided MediPort catheter seen with the tip in the mid SVC. No acute osseous abnormality. IMPRESSION: Mild cardiomegaly and central pulmonary vascular congestion. Small bilateral pleural effusions, right greater than left. Electronically Signed   By: Prudencio Pair M.D.   On: 09/18/2019 15:17    ____________________________________________   PROCEDURES  Procedure(s) performed (including Critical Care):  .Critical Care Performed by: Vanessa Holden, MD Authorized by: Vanessa Kimball, MD   Critical care provider statement:    Critical care time (minutes):  31   Critical care was necessary to treat or prevent imminent or life-threatening deterioration of the following conditions:  Sepsis   Critical care was time spent personally by me on the following activities:  Discussions with consultants, evaluation of patient's response to treatment, examination of patient, ordering and  performing treatments and interventions, ordering and review of laboratory studies, ordering and review of radiographic studies, pulse oximetry, re-evaluation of patient's condition, obtaining history from patient or surrogate and review of old charts     ____________________________________________   INITIAL IMPRESSION / ASSESSMENT AND PLAN / ED COURSE  Nadim Andretta was evaluated in Emergency Department on 09/18/2019 for the symptoms described in the history of present illness. He was evaluated in the context of the global COVID-19 pandemic, which necessitated consideration that the patient might be at risk for infection with the SARS-CoV-2 virus that causes COVID-19. Institutional protocols and algorithms that pertain to the evaluation of patients at risk for COVID-19 are in a state of rapid change based on information released by regulatory bodies including the CDC and federal and state organizations. These policies and algorithms were followed during the patient's care in the ED.    Patient presents with significant fevers and left upper quadrant pain with tachycardia.  This is concerning for sepsis.  Blood cultures and lactate were drawn.  Patient was started on broad-spectrum antibiotics given he is a cancer patient.  Given Tylenol for his fever.  Chest x-ray without evidence of pneumonia so will get CT PE evaluate for pulmonary embolism versus pneumonia underneath his small pleural effusions as well as CT abdomen to evaluate for intra-abdominal infection.  Will get urine to evaluate for UTI.  Patient handed off pending CT imaging and labs but patient will need to be admitted for sepsis.    ____________________________________________   FINAL CLINICAL IMPRESSION(S) / ED DIAGNOSES   Final diagnoses:  Fever, unspecified fever cause  Sepsis, due to unspecified organism, unspecified whether acute organ dysfunction present ([place])      MEDICATIONS GIVEN DURING THIS VISIT:   Medications  ceFEPIme (MAXIPIME) 2 g in sodium chloride 0.9 % 100 mL IVPB (has no administration in time range)  metroNIDAZOLE (FLAGYL) IVPB 500 mg (has no administration in time range)  vancomycin (VANCOCIN) IVPB 1000 mg/200 mL premix (has no administration in time range)  acetaminophen (TYLENOL) tablet 1,000 mg (has no administration in time range)  sodium chloride 0.9 % bolus 1,000 mL (has no administration in time range)  HYDROmorphone (DILAUDID) injection 0.5 mg (has no administration in time range)  ondansetron (ZOFRAN) injection 4 mg (has no administration in time range)     ED Discharge Orders    None       Note:  This document was prepared using Dragon voice recognition software and may include unintentional dictation errors.   Vanessa [place], MD 09/18/19 [phone]

## 2019-09-18 NOTE — H&P (Signed)
Eric [name] XBM:[id number] DOB: July 31, 1951 DOA: 09/18/2019     PCP: [name], MD   Outpatient Specialists:      Oncology  Dr. Cammie Sickle, MD     Patient arrived to ER on 09/18/19 at 1432  Patient coming from: home Lives   With family    Chief Complaint: Chest pain with inspiration or shortness of breath  HPI: Eric [name] is a 68 y.o. male with medical history significant of non-Hodgkin's lymphoma on chemotherapy , HTN, obesity , tobacco abuse  Presented with  24  h of  Left side chest pain was with inspiration, associated shortness of breath pain is radiating associated with fever Reports his blood pressure usually runs in the low 100. Since his diagnosis of CHF he was initially started on ARB and Coreg but did not tolerated secondary to development of rash. Later on Coreg was restarted patient continues to take it no rash was noted but he does feel extra fatigued.  Infectious risk factors:  Reports fever, shortness of breath, dry cough, chest pain  In  ER RAPID COVID TEST  in house testing  Pending  Lab Results  Component Value Date   [place] NEGATIVE 07/11/2019     Regarding pertinent Chronic problems:    Non-Hodgkin's lymphoma - sp r-COP   HTN on Coreg, cozaar   CHF  Systolic  - last echo 0/2725   obesity-   BMI Readings from Last 1 Encounters:  09/18/19 36.89 kg/m      While in ER: Found to be hypoxic Fever 103.2 CT showed PE  The following Work up has been ordered so far:  Orders Placed This Encounter  Procedures   Critical Care   Blood Culture (routine x 2)   Urine culture   SARS CORONAVIRUS 2 (TAT 6-24 HRS) Nasopharyngeal Nasopharyngeal Swab   DG Chest Port 1 View   CT Angio Chest PE W and/or Wo Contrast   CT ABDOMEN PELVIS W CONTRAST   Lactic acid, plasma   Comprehensive metabolic panel   CBC WITH DIFFERENTIAL   APTT   Protime-INR   Urinalysis, Routine w reflex microscopic   Brain  natriuretic peptide   Procalcitonin   Lipase, blood   Heparin level (unfractionated)   CBC   Diet NPO time specified   Cardiac monitoring   Refer to Sidebar Report: Sepsis Sidebar ED/IP   Document vital signs within 1-hour of fluid bolus completion and notify provider of bolus completion   Document Actual / Estimated Weight   Insert peripheral IV x 2   Initiate Carrier Fluid Protocol   Catherize if unable to void   Initiate Code Sepsis Karen Chafe [phone]) Reason for Consult? tracking   pharmacy consult   heparin per pharmacy consult   Consult to hospitalist  ALL PATIENTS BEING ADMITTED/HAVING PROCEDURES NEED COVID-19 SCREENING   Pulse oximetry, continuous   ED EKG 12-Lead     Following Medications were ordered in ER: Medications  heparin lock flush 100 UNIT/ML injection (has no administration in time range)  heparin ADULT infusion 100 units/mL (25000 units/22m sodium chloride 0.45%) (1,950 Units/hr Intravenous New Bag/Given 09/18/19 1745)  sodium chloride 0.9 % bolus 500 mL (has no administration in time range)  0.9 %  sodium chloride infusion (has no administration in time range)  ceFEPIme (MAXIPIME) 2 g in sodium chloride 0.9 % 100 mL IVPB (0 g Intravenous Stopped 09/18/19 1612)  metroNIDAZOLE (FLAGYL) IVPB 500 mg (0 mg Intravenous Stopped 09/18/19 1736)  vancomycin (VANCOCIN) IVPB 1000 mg/200 mL premix (0 mg Intravenous Stopped 09/18/19 1736)  acetaminophen (TYLENOL) tablet 1,000 mg (1,000 mg Oral Given 09/18/19 1523)  sodium chloride 0.9 % bolus 1,000 mL (0 mLs Intravenous Stopped 09/18/19 1736)  HYDROmorphone (DILAUDID) injection 0.5 mg (0.5 mg Intravenous Given 09/18/19 1609)  ondansetron (ZOFRAN) injection 4 mg (4 mg Intravenous Given 09/18/19 1609)  iohexol (OMNIPAQUE) 350 MG/ML injection 75 mL (75 mLs Intravenous Contrast Given 09/18/19 1640)  heparin bolus via infusion 4,000 Units (4,000 Units Intravenous Bolus from Bag 09/18/19 1740)          Consult Orders  (From admission, onward)         Start     Ordered   09/18/19 1808  Consult to hospitalist  ALL PATIENTS BEING ADMITTED/HAVING PROCEDURES NEED COVID-19 SCREENING  Once    Comments: ALL PATIENTS BEING ADMITTED/HAVING PROCEDURES NEED COVID-19 SCREENING  Provider:  (Not yet assigned)  Question Answer Comment  Place call to: [phone]   Reason for Consult Admit      09/18/19 1807          ER Provider Called:   Vasc Surgery  Dr. Ronalee Belts  They Recommend admit to medicine   Will see in AM  CT shows multiple PE with right side strain trop and BNP wnl.   Significant initial  Findings: Abnormal Labs Reviewed  COMPREHENSIVE METABOLIC PANEL - Abnormal; Notable for the following components:      Result Value   Glucose, Bld 120 (*)    Calcium 8.8 (*)    Albumin 3.4 (*)    Total Bilirubin 1.3 (*)    All other components within normal limits  CBC WITH DIFFERENTIAL/PLATELET - Abnormal; Notable for the following components:   WBC 14.0 (*)    RBC 3.66 (*)    Hemoglobin 10.1 (*)    HCT 30.7 (*)    RDW 18.7 (*)    nRBC 0.3 (*)    Neutro Abs 10.4 (*)    Monocytes Absolute 1.3 (*)    Abs Immature Granulocytes 1.31 (*)    All other components within normal limits  APTT - Abnormal; Notable for the following components:   aPTT 37 (*)    All other components within normal limits  PROTIME-INR - Abnormal; Notable for the following components:   Prothrombin Time 15.5 (*)    All other components within normal limits  URINALYSIS, ROUTINE W REFLEX MICROSCOPIC - Abnormal; Notable for the following components:   Color, Urine YELLOW (*)    APPearance HAZY (*)    Specific Gravity, Urine >1.046 (*)    Protein, ur 30 (*)    All other components within normal limits    Otherwise labs showing:    Recent Labs  Lab 09/18/19 1458  NA 136  K 4.0  CO2 23  GLUCOSE 120*  BUN 20  CREATININE 0.94  CALCIUM 8.8*    Cr   stable,   Lab Results  Component Value Date   CREATININE  0.94 09/18/2019   CREATININE 0.90 09/03/2019   CREATININE 0.92 08/13/2019    Recent Labs  Lab 09/18/19 1458  AST 29  ALT 32  ALKPHOS 71  BILITOT 1.3*  PROT 6.6  ALBUMIN 3.4*   Lab Results  Component Value Date   CALCIUM 8.8 (L) 09/18/2019     WBC      Component Value Date/Time   WBC 14.0 (H) 09/18/2019 1458   ANC    Component Value Date/Time   NEUTROABS 10.4 (  H) 09/18/2019 1458   NEUTROABS 7.6 (H) 03/01/2014 0426   ALC 0.9    Plt: Lab Results  Component Value Date   PLT 244 09/18/2019     Lactic Acid, Venous    Component Value Date/Time   LATICACIDVEN 1.3 09/18/2019 1458    Procalcitonin  0.16   COVID-19 Labs  Recent Labs    09/18/19 2014  FERRITIN 824*  LDH 346*    Lab Results  Component Value Date   SARSCOV2NAA NEGATIVE 07/11/2019       HG/HCT   Stable,     Component Value Date/Time   HGB 10.1 (L) 09/18/2019 1458   HGB 9.3 (L) 03/01/2014 1206   HCT 30.7 (L) 09/18/2019 1458   HCT 27.9 (L) 03/01/2014 0426    Recent Labs  Lab 09/18/19 1518  LIPASE 20   No results for input(s): AMMONIA in the last 168 hours.  No components found for: LABALBU   Troponin 10      ECG: Ordered Personally reviewed by me showing: HR : 100 Rhythm:  NSR w PVC   no evidence of ischemic changes QTC 440   BNP (last 3 results) Recent Labs    09/18/19 1458  BNP 43.0    ProBNP (last 3 results) No results for input(s): PROBNP in the last 8760 hours.  DM  labs:  HbA1C: No results for input(s): HGBA1C in the last 8760 hours.     CBG (last 3)  No results for input(s): GLUCAP in the last 72 hours.     UA   no evidence of UTI    Urine analysis:    Component Value Date/Time   COLORURINE YELLOW (A) 09/18/2019 1703   APPEARANCEUR HAZY (A) 09/18/2019 1703   APPEARANCEUR Cloudy 02/24/2014 1327   LABSPEC >1.046 (H) 09/18/2019 1703   LABSPEC 1.018 02/24/2014 1327   PHURINE 5.0 09/18/2019 1703   GLUCOSEU NEGATIVE 09/18/2019 1703   GLUCOSEU  Negative 02/24/2014 1327   HGBUR NEGATIVE 09/18/2019 1703   BILIRUBINUR NEGATIVE 09/18/2019 1703   BILIRUBINUR Negative 02/24/2014 1327   KETONESUR NEGATIVE 09/18/2019 1703   PROTEINUR 30 (A) 09/18/2019 1703   NITRITE NEGATIVE 09/18/2019 1703   LEUKOCYTESUR NEGATIVE 09/18/2019 1703   LEUKOCYTESUR Trace 02/24/2014 1327       Ordered   CXR -  NON acute   CTA chest -   PE,  no evidence of infiltrate     ED Triage Vitals  Enc Vitals Group     BP 09/18/19 1439 111/74     Pulse Rate 09/18/19 1439 (!) 107     Resp 09/18/19 1439 (!) 24     Temp 09/18/19 1439 (!) 103.2 F (39.6 C)     Temp Source 09/18/19 1439 Oral     SpO2 09/18/19 1439 94 %     Weight 09/18/19 1725 272 lb (123.4 kg)     Height --      Head Circumference --      Peak Flow --      Pain Score 09/18/19 1437 5     Pain Loc --      Pain Edu? --      Excl. in [place]? --   TMAX(24)@       Latest  Blood pressure (!) 97/58, pulse 83, temperature 100.1 F (37.8 C), temperature source Oral, resp. rate (!) 25, weight 123.4 kg, SpO2 94 %.     Hospitalist was called for admission for PE with possible Right side strain and SIRS etiology  Review of Systems:    Pertinent positives include: Fevers, chills, fatigue, shortness of breath at rest.  dyspnea on exertion, chest pain, non-productive cough,   Constitutional:  No weight loss, night sweats, weight loss  HEENT:  No headaches, Difficulty swallowing,Tooth/dental problems,Sore throat,  No sneezing, itching, ear ache, nasal congestion, post nasal drip,  Cardio-vascular:  No rthopnea, PND, anasarca, dizziness, palpitations.no Bilateral lower extremity swelling  GI:  No heartburn, indigestion, abdominal pain, nausea, vomiting, diarrhea, change in bowel habits, loss of appetite, melena, blood in stool, hematemesis Resp:  no No excess mucus, no productive cough, NoNo coughing up of blood.No change in color of mucus.No wheezing. Skin:  no rash or lesions. No  jaundice GU:  no dysuria, change in color of urine, no urgency or frequency. No straining to urinate.  No flank pain.  Musculoskeletal:  No joint pain or no joint swelling. No decreased range of motion. No back pain.  Psych:  No change in mood or affect. No depression or anxiety. No memory loss.  Neuro: no localizing neurological complaints, no tingling, no weakness, no double vision, no gait abnormality, no slurred speech, no confusion  All systems reviewed and apart from [place] all are negative  Past Medical History:   Past Medical History:  Diagnosis Date   Colon polyps    Pancreatitis    Rectal bleeding 02/24/14     Past Surgical History:  Procedure Laterality Date   CHOLECYSTECTOMY     COLONOSCOPY  02/28/14   IR IMAGING GUIDED PORT INSERTION  07/18/2019   POLYPECTOMY     colon polyp removed   TONSILLECTOMY      Social History:  Ambulatory  independently       reports that he has been smoking cigars. He has a 20.00 pack-year smoking history. He has never used smokeless tobacco. He reports current alcohol use. He reports that he does not use drugs.    Family History:   Family History  Adopted: Yes    Allergies: Allergies  Allergen Reactions   Azithromycin Itching     Prior to Admission medications   Medication Sig Start Date End Date Taking? Authorizing Provider  carvedilol (COREG) 6.25 MG tablet Take 1 tablet (6.25 mg total) by mouth 2 (two) times daily. 07/30/19  Yes Minna Merritts, MD  lidocaine-prilocaine (EMLA) cream Apply generously on the port 30-45 prior to accessing the port. 07/11/19  Yes Cammie Sickle, MD  Multiple Vitamins-Minerals (MULTI-VITAMIN/MINERALS PO) Take 1 capsule by mouth.   Yes [provider]   Physical Exam: Blood pressure (!) 97/58, pulse 83, temperature 100.1 F (37.8 C), temperature source Oral, resp. rate (!) 25, weight 123.4 kg, SpO2 94 %. 1. General:  in No Acute distress    Chronically ill   -appearing 2. Psychological: Alert and  Oriented 3. Head/ENT:   Dry Mucous Membranes                          Head Non traumatic, neck supple                           Poor Dentition 4. SKIN:   decreased Skin turgor,  Skin clean Dry and intact no rash 5. Heart: Regular rate and rhythm no Murmur, no Rub or gallop 6. Lungs:   no wheezes or crackles   7. Abdomen: Soft,  non-tender, Non distended  bowel sounds present 8. Lower extremities: no clubbing,  cyanosis, no edema 9. Neurologically Grossly intact, moving all 4 extremities equally  10. MSK: Normal range of motion   All other LABS:     Recent Labs  Lab 09/18/19 1458  WBC 14.0*  NEUTROABS 10.4*  HGB 10.1*  HCT 30.7*  MCV 83.9  PLT 244     Recent Labs  Lab 09/18/19 1458  NA 136  K 4.0  CL 105  CO2 23  GLUCOSE 120*  BUN 20  CREATININE 0.94  CALCIUM 8.8*     Recent Labs  Lab 09/18/19 1458  AST 29  ALT 32  ALKPHOS 71  BILITOT 1.3*  PROT 6.6  ALBUMIN 3.4*       Cultures:    Component Value Date/Time   SDES  08/13/2019 1208    URINE, CLEAN CATCH Performed at [facility], [address] [place], [place] [address]    [facility]  08/13/2019 1208    NONE Performed at [facility], [address], [place],  [address]    [place]  08/13/2019 1208    NO GROWTH Performed at [facility], [place] [address] [place],  [address]    REPTSTATUS 08/14/2019 FINAL 08/13/2019 1208     Radiological Exams on Admission: Ct Angio Chest Pe W And/or Wo Contrast  Result Date: 09/18/2019 CLINICAL DATA:  Severe pain, left axilla worsened with deep inspiration, PE suspected, high pretest probability additionally, acute generalized abdominal pain with fever worse in the left upper quadrant and left flank and lumbar ways CT EXAM: CT ANGIOGRAPHY CHEST CT ABDOMEN AND PELVIS WITH CONTRAST TECHNIQUE: Multidetector CT imaging of the chest was performed using the standard protocol during bolus  administration of intravenous contrast. Multiplanar CT image reconstructions and MIPs were obtained to evaluate the vascular anatomy. Multidetector CT imaging of the abdomen and pelvis was performed using the standard protocol during bolus administration of intravenous contrast. CONTRAST:  72m OMNIPAQUE IOHEXOL 350 MG/ML SOLN COMPARISON:  CT abdomen pelvis 02/24/2014 FINDINGS: CTA CHEST FINDINGS Cardiovascular: Suboptimal opacification of pulmonary arteries (main pulmonary artery bolus measuring 175 HU). Such finding may limit evaluation of the segmental and subsegmental pulmonary arteries. However, there is extensive pulmonary arterial filling defect seen throughout the lobar and proximal segmental pulmonary arteries of all 5 lobes mild central pulmonary artery enlargement is noted as well as elevation of the RV/LV ratio (1.06). Mild cardiomegaly. No pericardial effusion. Few coronary artery calcifications are present. Normal caliber thoracic aorta with scant atherosclerotic plaque. Normal 3 vessel branching of the arch with minimal calcification in the proximal great vessels. Tunneled right IJ Port-A-Cath tip terminates at the superior cavoatrial junction with port access at this time. Small pericaval lipoma. Major venous structures are otherwise unremarkable. Mediastinum/Nodes: Scattered low-attenuation, subcentimeter mediastinal hilar adenopathy. No pathologically enlarged mediastinal, hilar or axillary nodes. No acute abnormality of the trachea or esophagus. Thyroid gland and thoracic inlet are unremarkable. Lungs/Pleura: There are peripheral areas wedge-shaped opacity which could reflect sequela of wedge pulmonary infarcts versus a superimposed infectious process given more diffuse hazy ground-glass opacity throughout the lungs. Mild airways thickening is noted. Trace left effusion is seen. No pneumothorax. Bibasilar areas of atelectasis. Musculoskeletal: Multilevel degenerative changes are present in the  imaged portions of the spine. Mild degenerative changes in the shoulders. No acute osseous abnormality or suspicious osseous lesion. No concerning chest wall lesions. Review of the MIP images confirms the above findings. CT ABDOMEN and PELVIS FINDINGS Hepatobiliary: Stable dystrophic capsular calcifications along the posterior right lobe liver, unchanged  from comparison. No focal liver abnormality is seen. Patient is post cholecystectomy. Slight prominence of the biliary tree likely related to reservoir effect. No calcified intraductal gallstones. Pancreas: Unremarkable. No pancreatic ductal dilatation or surrounding inflammatory changes. Spleen: Splenomegaly. No concerning focal splenic lesions. Adrenals/Urinary Tract: Mild bilateral symmetric perinephric stranding, a nonspecific finding though may correlate with either age or decreased renal function. Findings unchanged from prior. Fluid attenuation cyst in the interpolar left kidney measuring 3.4 cm, increased in size from comparison but without concerning features. Multiple coarse calcifications are again seen in the left renal collecting system including a small 16 mm developing staghorn calculus seen in the lower renal pelvis with obstruction of the lower pole calices. No other obstructive uropathy or ureteral calculi. Normal urinary bladder. Stomach/Bowel: Distal esophagus, stomach and duodenal sweep are unremarkable. No small bowel wall thickening or dilatation. No evidence of obstruction. A normal appendix is visualized. No colonic dilatation or wall thickening. Scattered colonic diverticula without focal pericolonic inflammation to suggest diverticulitis. Vascular/Lymphatic: Atherosclerotic plaque within the normal caliber aorta. No suspicious or enlarged lymph nodes in the included lymphatic chains. Few calcified lymph nodes noted in the abdomen, similar comparison study. Reproductive: Coarse eccentric calcification of the prostate. No concerning  abnormalities of the prostate or seminal vesicles. Other: No abdominopelvic free fluid or free gas. No bowel containing hernias. Bilateral fat containing inguinal hernias. Musculoskeletal: Multilevel degenerative changes are present in the imaged portions of the spine. 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 No acute osseous abnormality or suspicious osseous lesion. Additional mild degenerative changes in the hips. Review of the MIP images confirms the above findings. IMPRESSION: 1. Suboptimal opacification of the pulmonary arteries (main pulmonary artery bolus measuring 175 HU). However, exam is positive for acute pulmonary emboli throughout the lobar and proximal segmental pulmonary arteries 5 lobes of the lungs with CT evidence of right heart strain (RV/LV Ratio = 1.06) consistent with at least submassive (intermediate risk) PE. The presence of right heart strain has been associated with an increased risk of morbidity and mortality. Please activate Code PE by paging [phone]. 2. Peripheral predominant areas of consolidation some of which have a wedge-shaped appearance which could reflect sequela of wedge pulmonary infarcts versus a superimposed infectious process given a background more diffuse hazy ground-glass opacity throughout the lungs. Trace left effusion. 3. Multiple coarse calcifications in the left renal collecting system including a 16 mm developing staghorn calculus in the lower pole calices with obstruction of the lower pole calices and layering milk of calcium. No other obstructive uropathy or ureteral calculi. 4. No other acute abnormality in the abdomen or pelvis. 5. Diverticulosis without evidence of acute diverticulitis. 6. Aortic Atherosclerosis (ICD10-I70.0). These results were called by telephone at the time of interpretation on 09/18/2019 at 5:24 pm to provider Merlyn Lot , who verbally acknowledged these results. Electronically Signed   By: Lovena Le M.D.    On: 09/18/2019 17:24   Ct Abdomen Pelvis W Contrast  Result Date: 09/18/2019 CLINICAL DATA:  Severe pain, left axilla worsened with deep inspiration, PE suspected, high pretest probability additionally, acute generalized abdominal pain with fever worse in the left upper quadrant and left flank and lumbar ways CT EXAM: CT ANGIOGRAPHY CHEST CT ABDOMEN AND PELVIS WITH CONTRAST TECHNIQUE: Multidetector CT imaging of the chest was performed using the standard protocol during bolus administration of intravenous contrast. Multiplanar CT image reconstructions and MIPs were obtained to evaluate the vascular anatomy. Multidetector CT imaging of the abdomen and pelvis was  performed using the standard protocol during bolus administration of intravenous contrast. CONTRAST:  81m OMNIPAQUE IOHEXOL 350 MG/ML SOLN COMPARISON:  CT abdomen pelvis 02/24/2014 FINDINGS: CTA CHEST FINDINGS Cardiovascular: Suboptimal opacification of pulmonary arteries (main pulmonary artery bolus measuring 175 HU). Such finding may limit evaluation of the segmental and subsegmental pulmonary arteries. However, there is extensive pulmonary arterial filling defect seen throughout the lobar and proximal segmental pulmonary arteries of all 5 lobes mild central pulmonary artery enlargement is noted as well as elevation of the RV/LV ratio (1.06). Mild cardiomegaly. No pericardial effusion. Few coronary artery calcifications are present. Normal caliber thoracic aorta with scant atherosclerotic plaque. Normal 3 vessel branching of the arch with minimal calcification in the proximal great vessels. Tunneled right IJ Port-A-Cath tip terminates at the superior cavoatrial junction with port access at this time. Small pericaval lipoma. Major venous structures are otherwise unremarkable. Mediastinum/Nodes: Scattered low-attenuation, subcentimeter mediastinal hilar adenopathy. No pathologically enlarged mediastinal, hilar or axillary nodes. No acute abnormality of  the trachea or esophagus. Thyroid gland and thoracic inlet are unremarkable. Lungs/Pleura: There are peripheral areas wedge-shaped opacity which could reflect sequela of wedge pulmonary infarcts versus a superimposed infectious process given more diffuse hazy ground-glass opacity throughout the lungs. Mild airways thickening is noted. Trace left effusion is seen. No pneumothorax. Bibasilar areas of atelectasis. Musculoskeletal: Multilevel degenerative changes are present in the imaged portions of the spine. Mild degenerative changes in the shoulders. No acute osseous abnormality or suspicious osseous lesion. No concerning chest wall lesions. Review of the MIP images confirms the above findings. CT ABDOMEN and PELVIS FINDINGS Hepatobiliary: Stable dystrophic capsular calcifications along the posterior right lobe liver, unchanged from comparison. No focal liver abnormality is seen. Patient is post cholecystectomy. Slight prominence of the biliary tree likely related to reservoir effect. No calcified intraductal gallstones. Pancreas: Unremarkable. No pancreatic ductal dilatation or surrounding inflammatory changes. Spleen: Splenomegaly. No concerning focal splenic lesions. Adrenals/Urinary Tract: Mild bilateral symmetric perinephric stranding, a nonspecific finding though may correlate with either age or decreased renal function. Findings unchanged from prior. Fluid attenuation cyst in the interpolar left kidney measuring 3.4 cm, increased in size from comparison but without concerning features. Multiple coarse calcifications are again seen in the left renal collecting system including a small 16 mm developing staghorn calculus seen in the lower renal pelvis with obstruction of the lower pole calices. No other obstructive uropathy or ureteral calculi. Normal urinary bladder. Stomach/Bowel: Distal esophagus, stomach and duodenal sweep are unremarkable. No small bowel wall thickening or dilatation. No evidence of  obstruction. A normal appendix is visualized. No colonic dilatation or wall thickening. Scattered colonic diverticula without focal pericolonic inflammation to suggest diverticulitis. Vascular/Lymphatic: Atherosclerotic plaque within the normal caliber aorta. No suspicious or enlarged lymph nodes in the included lymphatic chains. Few calcified lymph nodes noted in the abdomen, similar comparison study. Reproductive: Coarse eccentric calcification of the prostate. No concerning abnormalities of the prostate or seminal vesicles. Other: No abdominopelvic free fluid or free gas. No bowel containing hernias. Bilateral fat containing inguinal hernias. Musculoskeletal: Multilevel degenerative changes are present in the imaged portions of the spine. 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 No acute osseous abnormality or suspicious osseous lesion. Additional mild degenerative changes in the hips. Review of the MIP images confirms the above findings. IMPRESSION: 1. Suboptimal opacification of the pulmonary arteries (main pulmonary artery bolus measuring 175 HU). However, exam is positive for acute pulmonary emboli throughout the lobar and proximal segmental pulmonary arteries 5 lobes of the  lungs with CT evidence of right heart strain (RV/LV Ratio = 1.06) consistent with at least submassive (intermediate risk) PE. The presence of right heart strain has been associated with an increased risk of morbidity and mortality. Please activate Code PE by paging [phone]. 2. Peripheral predominant areas of consolidation some of which have a wedge-shaped appearance which could reflect sequela of wedge pulmonary infarcts versus a superimposed infectious process given a background more diffuse hazy ground-glass opacity throughout the lungs. Trace left effusion. 3. Multiple coarse calcifications in the left renal collecting system including a 16 mm developing staghorn calculus in the lower pole calices with  obstruction of the lower pole calices and layering milk of calcium. No other obstructive uropathy or ureteral calculi. 4. No other acute abnormality in the abdomen or pelvis. 5. Diverticulosis without evidence of acute diverticulitis. 6. Aortic Atherosclerosis (ICD10-I70.0). These results were called by telephone at the time of interpretation on 09/18/2019 at 5:24 pm to provider Merlyn Lot , who verbally acknowledged these results. Electronically Signed   By: Lovena Le M.D.   On: 09/18/2019 17:24   Dg Chest Port 1 View  Result Date: 09/18/2019 CLINICAL DATA:  Shortness of breath EXAM: PORTABLE CHEST 1 VIEW COMPARISON:  None. FINDINGS: There is mild cardiomegaly. There is mild prominence to the central pulmonary vasculature. There is blunting of the bilateral costophrenic angles which could be small bilateral pleural effusions, right greater than left. A right-sided MediPort catheter seen with the tip in the mid SVC. No acute osseous abnormality. IMPRESSION: Mild cardiomegaly and central pulmonary vascular congestion. Small bilateral pleural effusions, right greater than left. Electronically Signed   By: Prudencio Pair M.D.   On: 09/18/2019 15:17    Chart has been reviewed    Assessment/Plan  68 y.o. male with medical history significant of non-Hodgkin's lymphoma on chemotherapy HTN, obesity , tobacco abuse  Admitted for PE  Present on Admission: SEPSIS -  -SIRS criteria met with   fever tachycardia and hypotension  with  evidence of end organ damage such as hypoxia  - source undetermined Currently undergoing Covid testing still pending If positive will need transfer to [facility]. For now continue airborne precautions Obtain respiratory panel  - Obtain serial lactic acid and  procalcitonin level. WNL  - Initiate broad-spectrum IV antibiotics   - await results of blood and urine culture  - Rehydrate aggressively   Pulmonary embolism (HCC) -  Admit to stepdown Initiate heparin  drip  Would likely benefit from case manager consult for long term anticoagulation Hold home blood pressure medications avoid hypotension Cycle cardiac enzymes Order echogram and lower extremity Dopplers  Most likely risk factors for hypercoagulable state being  obesity    Malignancy   Given hypotension ER provider has consulted vascular surgery who is aware code PE activated, rehydrate and monitor for tonight no indication for acute TPA at this time Hold home BP meds   Tobacco abuse -   Spoke about importance of quitting spent 5 minutes discussing options for treatment, prior attempts at quitting, and dangers of smoking  -At this point patient is  NOT  interested in quitting  - nursing tobacco cessation protocol   Dilated cardiomyopathy ([place]) - repeat echo current appears to be euvolemic Hold carvedilol     Morbid obesity ([place]) - will need to follow up with nutrition as outpatient   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  DVT prophylaxis:  heparin  Code Status:  FULL CODE as per patient   I had personally discussed CODE STATUS with patient    Family Communication:   Family not at  Bedside    Disposition Plan:         To home once workup is complete and patient is stable                     Would benefit from PT/OT eval prior to DC  Ordered                    Consults called: notified ONCOLOGY   Admission status:  ED Disposition    ED Disposition Condition [place]: [place] [100120]  Level of Care: Stepdown [14]  Covid Evaluation: Person Under Investigation (PUI)  Diagnosis: Pulmonary embolism ([place]) [883254]  Admitting Physician: Toy Baker [[id number]]  Attending Physician: Toy Baker [[id number]]  PT Class (Do Not Modify): Observation [104]  PT Acc Code (Do Not Modify): Observation [10022]       Obs     Level of care       SDU tele  indefinitely please discontinue once patient no longer qualifies  Precautions:   Airborne and Contact precautions  PPE: Used by the provider:   P100  eye Goggles,  Gloves  gown     Belen Zwahlen 09/18/2019, 10:39 PM    [facility]     after 2 AM please page floor coverage PA If 7AM-7PM, please contact the day team taking care of the patient using [email or web address]

## 2019-09-18 NOTE — Progress Notes (Signed)
CODE SEPSIS - PHARMACY COMMUNICATION  **Broad Spectrum Antibiotics should be administered within 1 hour of Sepsis diagnosis**  Time Code Sepsis Called/Page Received: 1455  Antibiotics Ordered: Vancomycin/ Cefepime   Time of 1st antibiotic administration: Cefepime @ 1524   Additional action taken by pharmacy: N/A  If necessary, Name of Provider/Nurse Contacted: N/A    Rowland Lathe ,PharmD Clinical Pharmacist  09/18/2019  3:03 PM

## 2019-09-18 NOTE — ED Notes (Signed)
Pt alert  Family with pt.  nsr on monitor.  Iv meds infusing.

## 2019-09-18 NOTE — Telephone Encounter (Signed)
Patient called and reports that He has developed a severe pain at the left side very close to his axilla. Denies shortness of breath, though when he takes a deep breath, it causes his pain to exacerbate. He states it almost feel muscular. He states he is in distress and his wife reports that he was yelling out all night long. Please advise

## 2019-09-18 NOTE — ED Triage Notes (Signed)
Pt c/o RUQ abd pain. Sent from cancer center where pt tx for nonhodgkins bcell lymphoma, oncologist concerned about spleen.

## 2019-09-18 NOTE — ED Notes (Signed)
I vmeds infusing.  nsr on monitor.  Pt alert.  Oxygen in place at 2 liters.  No acute distress.

## 2019-09-18 NOTE — Telephone Encounter (Signed)
Patient accepts 145 appointment today

## 2019-09-18 NOTE — Consult Note (Signed)
PHARMACY -  BRIEF ANTIBIOTIC NOTE   Pharmacy has received consult(s) for Cefepime/Vancomycin from an ED provider.  The patient's profile has been reviewed for ht/wt/allergies/indication/available labs.    One time order(s) placed for Cefepime 2 g x1 dose and Vancomycin 1g x1 dose.   Further antibiotics/pharmacy consults should be ordered by admitting physician if indicated.                       Thank you, Rowland Lathe 09/18/2019  3:04 PM

## 2019-09-18 NOTE — ED Notes (Signed)
Report off to dee rn

## 2019-09-18 NOTE — ED Notes (Signed)
Pt has port. 

## 2019-09-18 NOTE — Telephone Encounter (Signed)
Forwarding to Dr. Jacinto Reap and team.

## 2019-09-18 NOTE — ED Notes (Signed)
Heparin infusing.  Family at bedside.  Pt alert.

## 2019-09-19 ENCOUNTER — Observation Stay
Admit: 2019-09-19 | Discharge: 2019-09-19 | Disposition: A | Discharge status: Home / Self Care | Payer: Medicare Other | Attending: Internal Medicine | Admitting: Internal Medicine

## 2019-09-19 ENCOUNTER — Observation Stay: Payer: Medicare Other

## 2019-09-19 ENCOUNTER — Other Ambulatory Visit: Payer: Self-pay

## 2019-09-19 DIAGNOSIS — N200 Calculus of kidney: Secondary | ICD-10-CM | POA: Diagnosis present

## 2019-09-19 DIAGNOSIS — I82431 Acute embolism and thrombosis of right popliteal vein: Secondary | ICD-10-CM | POA: Diagnosis not present

## 2019-09-19 DIAGNOSIS — I5020 Unspecified systolic (congestive) heart failure: Secondary | ICD-10-CM | POA: Diagnosis present

## 2019-09-19 DIAGNOSIS — C8590 Non-Hodgkin lymphoma, unspecified, unspecified site: Secondary | ICD-10-CM | POA: Diagnosis not present

## 2019-09-19 DIAGNOSIS — D6481 Anemia due to antineoplastic chemotherapy: Secondary | ICD-10-CM

## 2019-09-19 DIAGNOSIS — R5383 Other fatigue: Secondary | ICD-10-CM | POA: Diagnosis not present

## 2019-09-19 DIAGNOSIS — R0602 Shortness of breath: Secondary | ICD-10-CM | POA: Diagnosis present

## 2019-09-19 DIAGNOSIS — A419 Sepsis, unspecified organism: Secondary | ICD-10-CM | POA: Diagnosis present

## 2019-09-19 DIAGNOSIS — I2699 Other pulmonary embolism without acute cor pulmonale: Secondary | ICD-10-CM

## 2019-09-19 DIAGNOSIS — J9811 Atelectasis: Secondary | ICD-10-CM | POA: Diagnosis present

## 2019-09-19 DIAGNOSIS — Z20828 Contact with and (suspected) exposure to other viral communicable diseases: Secondary | ICD-10-CM | POA: Diagnosis present

## 2019-09-19 DIAGNOSIS — C8330 Diffuse large B-cell lymphoma, unspecified site: Secondary | ICD-10-CM

## 2019-09-19 DIAGNOSIS — I2694 Multiple subsegmental pulmonary emboli without acute cor pulmonale: Secondary | ICD-10-CM | POA: Diagnosis not present

## 2019-09-19 DIAGNOSIS — Z720 Tobacco use: Secondary | ICD-10-CM | POA: Diagnosis not present

## 2019-09-19 DIAGNOSIS — F17290 Nicotine dependence, other tobacco product, uncomplicated: Secondary | ICD-10-CM | POA: Diagnosis present

## 2019-09-19 DIAGNOSIS — Z881 Allergy status to other antibiotic agents status: Secondary | ICD-10-CM | POA: Diagnosis not present

## 2019-09-19 DIAGNOSIS — Z79899 Other long term (current) drug therapy: Secondary | ICD-10-CM | POA: Diagnosis not present

## 2019-09-19 DIAGNOSIS — J9601 Acute respiratory failure with hypoxia: Secondary | ICD-10-CM | POA: Diagnosis present

## 2019-09-19 DIAGNOSIS — I420 Dilated cardiomyopathy: Secondary | ICD-10-CM | POA: Diagnosis present

## 2019-09-19 DIAGNOSIS — Z6836 Body mass index (BMI) 36.0-36.9, adult: Secondary | ICD-10-CM | POA: Diagnosis not present

## 2019-09-19 DIAGNOSIS — I110 Hypertensive heart disease with heart failure: Secondary | ICD-10-CM | POA: Diagnosis present

## 2019-09-19 LAB — BASIC METABOLIC PANEL
Anion gap: 12 (ref 5–15)
BUN: 18 mg/dL (ref 8–23)
CO2: 21 mmol/L — ABNORMAL LOW (ref 22–32)
Calcium: 8.2 mg/dL — ABNORMAL LOW (ref 8.9–10.3)
Chloride: 105 mmol/L (ref 98–111)
Creatinine, Ser: 0.91 mg/dL (ref 0.61–1.24)
GFR calc Af Amer: >60 mL/min (ref >60)
GFR calc non Af Amer: >60 mL/min (ref >60)
Glucose, Bld: 101 mg/dL — ABNORMAL HIGH (ref 70–99)
Potassium: 3.9 mmol/L (ref 3.5–5.1)
Sodium: 138 mmol/L (ref 135–145)

## 2019-09-19 LAB — CBC
HCT: 27.8 % — ABNORMAL LOW (ref 39.0–52.0)
Hemoglobin: 8.8 g/dL — ABNORMAL LOW (ref 13.0–17.0)
MCH: 27.5 pg (ref 26.0–34.0)
MCHC: 31.7 g/dL (ref 30.0–36.0)
MCV: 86.9 fL (ref 80.0–100.0)
Platelets: 216 10*3/uL (ref 150–400)
RBC: 3.2 MIL/uL — ABNORMAL LOW (ref 4.22–5.81)
RDW: 18.3 % — ABNORMAL HIGH (ref 11.5–15.5)
WBC: 13.4 10*3/uL — ABNORMAL HIGH (ref 4.0–10.5)
nRBC: 0.1 % (ref 0.0–0.2)

## 2019-09-19 LAB — ECHOCARDIOGRAM COMPLETE: Weight: 4352 oz

## 2019-09-19 LAB — MAGNESIUM: Magnesium: 2 mg/dL (ref 1.7–2.4)

## 2019-09-19 LAB — PHOSPHORUS: Phosphorus: 3.4 mg/dL (ref 2.5–4.6)

## 2019-09-19 LAB — TSH: TSH: 1.134 u[IU]/mL (ref 0.350–4.500)

## 2019-09-19 LAB — LACTIC ACID, PLASMA
Lactic Acid, Venous: 1.3 mmol/L (ref 0.5–1.9)
Lactic Acid, Venous: 2.9 mmol/L (ref 0.5–1.9)

## 2019-09-19 LAB — SARS CORONAVIRUS 2 (TAT 6-24 HRS): SARS Coronavirus 2: NEGATIVE

## 2019-09-19 LAB — HIV ANTIBODY (ROUTINE TESTING W REFLEX): HIV Screen 4th Generation wRfx: NONREACTIVE

## 2019-09-19 LAB — URINE CULTURE: Culture: <10000 — AB

## 2019-09-19 LAB — HEPARIN LEVEL (UNFRACTIONATED)
Heparin Unfractionated: 0.25 IU/mL — ABNORMAL LOW (ref 0.30–0.70)
Heparin Unfractionated: 0.2 IU/mL — ABNORMAL LOW (ref 0.30–0.70)
Heparin Unfractionated: 0.43 IU/mL (ref 0.30–0.70)

## 2019-09-19 LAB — TROPONIN I (HIGH SENSITIVITY): Troponin I (High Sensitivity): 7 ng/L (ref <18)

## 2019-09-19 MED ORDER — METRONIDAZOLE IN NACL 5-0.79 MG/ML-% IV SOLN
500.0000 mg | Freq: Three times a day (TID) | INTRAVENOUS | Status: DC
  Administered 2019-09-19 – 2019-09-21 (×7): 500 mg via INTRAVENOUS
  Filled 2019-09-19 (×9): qty 100

## 2019-09-19 MED ORDER — CHLORHEXIDINE GLUCONATE CLOTH 2 % EX PADS
6.0000 | MEDICATED_PAD | Freq: Every day | CUTANEOUS | Status: DC
  Administered 2019-09-19 – 2019-09-21 (×3): 6 via TOPICAL

## 2019-09-19 MED ORDER — SODIUM CHLORIDE 0.9 % IV SOLN
INTRAVENOUS | Status: AC
  Administered 2019-09-19 – 2019-09-20 (×3): via INTRAVENOUS

## 2019-09-19 MED ORDER — HYDROCODONE-ACETAMINOPHEN 5-325 MG PO TABS: 1.0000 | ORAL_TABLET | ORAL | Status: DC | PRN

## 2019-09-19 MED ORDER — ACETAMINOPHEN 325 MG PO TABS
650.0000 mg | ORAL_TABLET | Freq: Four times a day (QID) | ORAL | Status: DC | PRN
  Administered 2019-09-19 – 2019-09-21 (×2): 650 mg via ORAL
  Filled 2019-09-19 (×2): qty 2

## 2019-09-19 MED ORDER — CHLORHEXIDINE GLUCONATE CLOTH 2 % EX PADS: 6.0000 | MEDICATED_PAD | Freq: Every day | CUTANEOUS | Status: DC

## 2019-09-19 MED ORDER — ONDANSETRON HCL 4 MG PO TABS: 4.0000 mg | ORAL_TABLET | Freq: Four times a day (QID) | ORAL | Status: DC | PRN

## 2019-09-19 MED ORDER — HEPARIN BOLUS VIA INFUSION
1500.0000 [IU] | Freq: Once | INTRAVENOUS | Status: AC
  Administered 2019-09-19: 1500 [IU] via INTRAVENOUS
  Filled 2019-09-19: qty 1500

## 2019-09-19 MED ORDER — HEPARIN BOLUS VIA INFUSION
1400.0000 [IU] | Freq: Once | INTRAVENOUS | Status: AC
  Administered 2019-09-19: 1400 [IU] via INTRAVENOUS
  Filled 2019-09-19: qty 1400

## 2019-09-19 MED ORDER — LEVOFLOXACIN IN D5W 750 MG/150ML IV SOLN
750.0000 mg | INTRAVENOUS | Status: DC
  Administered 2019-09-19 – 2019-09-20 (×2): 750 mg via INTRAVENOUS
  Filled 2019-09-19 (×3): qty 150

## 2019-09-19 MED ORDER — VANCOMYCIN HCL IN DEXTROSE 750-5 MG/150ML-% IV SOLN
750.0000 mg | Freq: Three times a day (TID) | INTRAVENOUS | Status: DC
  Administered 2019-09-19: 750 mg via INTRAVENOUS
  Filled 2019-09-19 (×3): qty 150

## 2019-09-19 MED ORDER — ACETAMINOPHEN 650 MG RE SUPP: 650.0000 mg | Freq: Four times a day (QID) | RECTAL | Status: DC | PRN

## 2019-09-19 MED ORDER — SODIUM CHLORIDE 0.9 % IV SOLN
INTRAVENOUS | Status: DC
  Administered 2019-09-19: 11:00:00 via INTRAVENOUS

## 2019-09-19 MED ORDER — ONDANSETRON HCL 4 MG/2ML IJ SOLN: 4.0000 mg | Freq: Four times a day (QID) | INTRAMUSCULAR | Status: DC | PRN

## 2019-09-19 MED ORDER — SODIUM CHLORIDE 0.9 % IV SOLN
2.0000 g | Freq: Three times a day (TID) | INTRAVENOUS | Status: DC
  Administered 2019-09-19: 2 g via INTRAVENOUS
  Filled 2019-09-19: qty 2

## 2019-09-19 MED ORDER — SODIUM CHLORIDE 0.9 % IV BOLUS
250.0000 mL | Freq: Once | INTRAVENOUS | Status: AC
  Administered 2019-09-19: 250 mL via INTRAVENOUS

## 2019-09-19 NOTE — ED Notes (Signed)
Pt assisted to toilet. No further needs addressed. Will continue to monitor.

## 2019-09-19 NOTE — Progress Notes (Signed)
PROGRESS NOTE  Eric [name] [id number] DOB: 10-21-1951 DOA: 09/18/2019 PCP: [name], MD   LOS: 0 days   Brief Narrative / Interim history: 68 year old male with history of non-Hodgkin's lymphoma on chemotherapy, hypertension, obesity, tobacco use, who presents to the hospital with chief complaint of left-sided pleuritic chest pain as well as shortness of breath and low-grade temp.  In the emergency room CT angiogram was positive for PE, he was placed on heparin infusion and we are asked to admit.  Subjective / 24h Interval events: He is feeling a lot better this morning, his breathing is better.  Still has pleuritic type chest pain with deep breathing but appreciates improvement compared to first arrival into the emergency room  Assessment & Plan: Principal Problem Acute hypoxic respiratory failure due to acute pulmonary embolism -Likely in the setting of underlying malignancy, poor mobility due to obesity -Patient started on heparin infusion, continue -Lower extremity Dopplers positive for DVT which is likely the source of his PE -2D echo pending, CT scan did show right heart strain but tends to overread -CT of the chest also shows peripheral consolidation possibly related to pulmonary infarcts versus superimposed infectious process and patient was started on broad-spectrum antibiotics.  This is less likely, will discontinue vancomycin and cefepime but will place on few days of Levaquin for coverage -Patient on 4 L nasal cannula in the ED, wean off as tolerated  Active Problems Non-Hodgkin's lymphoma -Outpatient management  Dilated cardiomyopathy -Repeat echo pending  Tobacco use -will need to quit  Staghorn calculus in the left renal collecting system -Without obstruction, will need outpatient urology follow-up  Hypertension -Hold Coreg for now   Scheduled Meds: Continuous Infusions:  sodium chloride 100 mL/hr at 09/19/19 1040   ceFEPime (MAXIPIME) IV  Stopped (09/19/19 0615)   heparin 2,100 Units/hr (09/19/19 0537)   metronidazole Stopped (09/19/19 0758)   vancomycin Stopped (09/19/19 1021)   PRN Meds:.acetaminophen **OR** acetaminophen, HYDROcodone-acetaminophen, ondansetron **OR** ondansetron (ZOFRAN) IV  DVT prophylaxis: heparin infusion Code Status: Full code Family Communication: Wife present at bedside Disposition Plan: Change admission from stepdown to telemetry  Consultants:  None  Procedures:  2D echo: pending  LE venous doppler US IMPRESSION: 1. Nonocclusive thrombus in the right popliteal vein, right posterior tibial vein and right peroneal vein. 2. No evidence of left lower extremity deep venous thrombosis.  Microbiology  SARS-CoV-2 -11/17 Blood cultures 11/17-no growth to date Urine culture 11/17-no growth Respiratory panel-pending  Antimicrobials: Vancomycin/cefepime 11/17--11/18 Levaquin 11/18 --   Objective: Vitals:   09/19/19 0517 09/19/19 0738 09/19/19 0800 09/19/19 1041  BP: 106/62 105/66 125/72 116/65  Pulse:  88 93 97  Resp: (!) 28 (!) 22 (!) 21 (!) 34  Temp:      TempSrc:      SpO2:  94% 97% 96%  Weight:        Intake/Output Summary (Last 24 hours) at 09/19/2019 1108 Last data filed at 09/19/2019 0758 Gross per 24 hour  Intake 100 ml  Output --  Net 100 ml   Filed Weights   09/18/19 1725  Weight: 123.4 kg    Examination:  Constitutional: NAD Eyes: lids and conjunctivae normal, no scleral icterus ENMT: Mucous membranes are moist.  Neck: normal, supple Respiratory: Diminished at the bases, no wheezing, no crackles, shallow antalgic breathing Cardiovascular: Regular rate and rhythm, no murmurs / rubs / gallops.  2+ lower extremity edema on the right, 1+ on the left Abdomen: no tenderness. Bowel sounds positive.  Musculoskeletal: no  clubbing / cyanosis.  Skin: no rashes Neurologic: CN 2-12 grossly intact. Strength 5/5 in all 4.  Psychiatric: Normal judgment and insight.  Alert and oriented x 3. Normal mood.    Data Reviewed: I have independently reviewed following labs and imaging studies   CBC: Recent Labs  Lab 09/18/19 1458 09/19/19 0352  WBC 14.0* 13.4*  NEUTROABS 10.4*  --   HGB 10.1* 8.8*  HCT 30.7* 27.8*  MCV 83.9 86.9  PLT 244 [id number]   Basic Metabolic Panel: Recent Labs  Lab 09/18/19 1458 09/19/19 0352  NA 136 138  K 4.0 3.9  CL 105 105  CO2 23 21*  GLUCOSE 120* 101*  BUN 20 18  CREATININE 0.94 0.91  CALCIUM 8.8* 8.2*  MG  --  2.0  PHOS  --  3.4   Liver Function Tests: Recent Labs  Lab 09/18/19 1458  AST 29  ALT 32  ALKPHOS 71  BILITOT 1.3*  PROT 6.6  ALBUMIN 3.4*   Coagulation Profile: Recent Labs  Lab 09/18/19 1458  INR 1.2   HbA1C: No results for input(s): HGBA1C in the last 72 hours. CBG: No results for input(s): GLUCAP in the last 168 hours.  Recent Results (from the past 240 hour(s))  Blood Culture (routine x 2)     Status: None (Preliminary result)   Collection Time: 09/18/19  2:58 PM   Specimen: BLOOD  Result Value Ref Range Status   Specimen Description BLOOD PORTA CATH  Final   Special Requests   Final    BOTTLES DRAWN AEROBIC AND ANAEROBIC Blood Culture adequate volume   Culture   Final    NO GROWTH < 24 HOURS Performed at [facility], [address] [place], [place] [address]    Report Status PENDING  Incomplete  Blood Culture (routine x 2)     Status: None (Preliminary result)   Collection Time: 09/18/19  3:03 PM   Specimen: BLOOD  Result Value Ref Range Status   Specimen Description BLOOD LEFT ANTECUBITAL  Final   Special Requests   Final    BOTTLES DRAWN AEROBIC AND ANAEROBIC Blood Culture adequate volume   Culture   Final    NO GROWTH < 24 HOURS Performed at [facility], [address], [place], [place] [address]    Report Status PENDING  Incomplete  SARS CORONAVIRUS 2 (TAT 6-24 HRS) Nasopharyngeal Nasopharyngeal Swab     Status: None   Collection Time:  09/18/19  3:07 PM   Specimen: Nasopharyngeal Swab  Result Value Ref Range Status   SARS Coronavirus 2 NEGATIVE NEGATIVE Final    Comment: (NOTE) SARS-CoV-2 target nucleic acids are NOT DETECTED. The SARS-CoV-2 RNA is generally detectable in upper and lower respiratory specimens during the acute phase of infection. Negative results do not preclude SARS-CoV-2 infection, do not rule out co-infections with other pathogens, and should not be used as the sole basis for treatment or other patient management decisions. Negative results must be combined with clinical observations, patient history, and epidemiological information. The expected result is Negative. Fact Sheet for Patients: SugarRoll.be Fact Sheet for Healthcare Providers: https://www.woods-mathews.com/ This test is not yet approved or cleared by the [place] FDA and  has been authorized for detection and/or diagnosis of SARS-CoV-2 by FDA under an Emergency Use Authorization (EUA). This EUA will remain  in effect (meaning this test can be used) for the duration of the COVID-19 declaration under Section 56 4(b)(1) of the Act, 21 U.S.C. section 360bbb-3(b)(1), unless the  authorization is terminated or revoked sooner. Performed at [facility], [place] [address] [place], [place] [address]      Radiology Studies: Ct Angio Chest Pe W And/or Wo Contrast  Result Date: 09/18/2019 CLINICAL DATA:  Severe pain, left axilla worsened with deep inspiration, PE suspected, high pretest probability additionally, acute generalized abdominal pain with fever worse in the left upper quadrant and left flank and lumbar ways CT EXAM: CT ANGIOGRAPHY CHEST CT ABDOMEN AND PELVIS WITH CONTRAST TECHNIQUE: Multidetector CT imaging of the chest was performed using the standard protocol during bolus administration of intravenous contrast. Multiplanar CT image reconstructions and MIPs were obtained to evaluate  the vascular anatomy. Multidetector CT imaging of the abdomen and pelvis was performed using the standard protocol during bolus administration of intravenous contrast. CONTRAST:  57mL OMNIPAQUE IOHEXOL 350 MG/ML SOLN COMPARISON:  CT abdomen pelvis 02/24/2014 FINDINGS: CTA CHEST FINDINGS Cardiovascular: Suboptimal opacification of pulmonary arteries (main pulmonary artery bolus measuring 175 HU). Such finding may limit evaluation of the segmental and subsegmental pulmonary arteries. However, there is extensive pulmonary arterial filling defect seen throughout the lobar and proximal segmental pulmonary arteries of all 5 lobes mild central pulmonary artery enlargement is noted as well as elevation of the RV/LV ratio (1.06). Mild cardiomegaly. No pericardial effusion. Few coronary artery calcifications are present. Normal caliber thoracic aorta with scant atherosclerotic plaque. Normal 3 vessel branching of the arch with minimal calcification in the proximal great vessels. Tunneled right IJ Port-A-Cath tip terminates at the superior cavoatrial junction with port access at this time. Small pericaval lipoma. Major venous structures are otherwise unremarkable. Mediastinum/Nodes: Scattered low-attenuation, subcentimeter mediastinal hilar adenopathy. No pathologically enlarged mediastinal, hilar or axillary nodes. No acute abnormality of the trachea or esophagus. Thyroid gland and thoracic inlet are unremarkable. Lungs/Pleura: There are peripheral areas wedge-shaped opacity which could reflect sequela of wedge pulmonary infarcts versus a superimposed infectious process given more diffuse hazy ground-glass opacity throughout the lungs. Mild airways thickening is noted. Trace left effusion is seen. No pneumothorax. Bibasilar areas of atelectasis. Musculoskeletal: Multilevel degenerative changes are present in the imaged portions of the spine. Mild degenerative changes in the shoulders. No acute osseous abnormality or  suspicious osseous lesion. No concerning chest wall lesions. Review of the MIP images confirms the above findings. CT ABDOMEN and PELVIS FINDINGS Hepatobiliary: Stable dystrophic capsular calcifications along the posterior right lobe liver, unchanged from comparison. No focal liver abnormality is seen. Patient is post cholecystectomy. Slight prominence of the biliary tree likely related to reservoir effect. No calcified intraductal gallstones. Pancreas: Unremarkable. No pancreatic ductal dilatation or surrounding inflammatory changes. Spleen: Splenomegaly. No concerning focal splenic lesions. Adrenals/Urinary Tract: Mild bilateral symmetric perinephric stranding, a nonspecific finding though may correlate with either age or decreased renal function. Findings unchanged from prior. Fluid attenuation cyst in the interpolar left kidney measuring 3.4 cm, increased in size from comparison but without concerning features. Multiple coarse calcifications are again seen in the left renal collecting system including a small 16 mm developing staghorn calculus seen in the lower renal pelvis with obstruction of the lower pole calices. No other obstructive uropathy or ureteral calculi. Normal urinary bladder. Stomach/Bowel: Distal esophagus, stomach and duodenal sweep are unremarkable. No small bowel wall thickening or dilatation. No evidence of obstruction. A normal appendix is visualized. No colonic dilatation or wall thickening. Scattered colonic diverticula without focal pericolonic inflammation to suggest diverticulitis. Vascular/Lymphatic: Atherosclerotic plaque within the normal caliber aorta. No suspicious or enlarged lymph nodes in the included  lymphatic chains. Few calcified lymph nodes noted in the abdomen, similar comparison study. Reproductive: Coarse eccentric calcification of the prostate. No concerning abnormalities of the prostate or seminal vesicles. Other: No abdominopelvic free fluid or free gas. No bowel  containing hernias. Bilateral fat containing inguinal hernias. Musculoskeletal: Multilevel degenerative changes are present in the imaged portions of the spine. 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 No acute osseous abnormality or suspicious osseous lesion. Additional mild degenerative changes in the hips. Review of the MIP images confirms the above findings. IMPRESSION: 1. Suboptimal opacification of the pulmonary arteries (main pulmonary artery bolus measuring 175 HU). However, exam is positive for acute pulmonary emboli throughout the lobar and proximal segmental pulmonary arteries 5 lobes of the lungs with CT evidence of right heart strain (RV/LV Ratio = 1.06) consistent with at least submassive (intermediate risk) PE. The presence of right heart strain has been associated with an increased risk of morbidity and mortality. Please activate Code PE by paging [phone]. 2. Peripheral predominant areas of consolidation some of which have a wedge-shaped appearance which could reflect sequela of wedge pulmonary infarcts versus a superimposed infectious process given a background more diffuse hazy ground-glass opacity throughout the lungs. Trace left effusion. 3. Multiple coarse calcifications in the left renal collecting system including a 16 mm developing staghorn calculus in the lower pole calices with obstruction of the lower pole calices and layering milk of calcium. No other obstructive uropathy or ureteral calculi. 4. No other acute abnormality in the abdomen or pelvis. 5. Diverticulosis without evidence of acute diverticulitis. 6. Aortic Atherosclerosis (ICD10-I70.0). These results were called by telephone at the time of interpretation on 09/18/2019 at 5:24 pm to provider Merlyn Lot , who verbally acknowledged these results. Electronically Signed   By: Lovena Le M.D.   On: 09/18/2019 17:24   Ct Abdomen Pelvis W Contrast  Result Date: 09/18/2019 CLINICAL DATA:  Severe  pain, left axilla worsened with deep inspiration, PE suspected, high pretest probability additionally, acute generalized abdominal pain with fever worse in the left upper quadrant and left flank and lumbar ways CT EXAM: CT ANGIOGRAPHY CHEST CT ABDOMEN AND PELVIS WITH CONTRAST TECHNIQUE: Multidetector CT imaging of the chest was performed using the standard protocol during bolus administration of intravenous contrast. Multiplanar CT image reconstructions and MIPs were obtained to evaluate the vascular anatomy. Multidetector CT imaging of the abdomen and pelvis was performed using the standard protocol during bolus administration of intravenous contrast. CONTRAST:  36mL OMNIPAQUE IOHEXOL 350 MG/ML SOLN COMPARISON:  CT abdomen pelvis 02/24/2014 FINDINGS: CTA CHEST FINDINGS Cardiovascular: Suboptimal opacification of pulmonary arteries (main pulmonary artery bolus measuring 175 HU). Such finding may limit evaluation of the segmental and subsegmental pulmonary arteries. However, there is extensive pulmonary arterial filling defect seen throughout the lobar and proximal segmental pulmonary arteries of all 5 lobes mild central pulmonary artery enlargement is noted as well as elevation of the RV/LV ratio (1.06). Mild cardiomegaly. No pericardial effusion. Few coronary artery calcifications are present. Normal caliber thoracic aorta with scant atherosclerotic plaque. Normal 3 vessel branching of the arch with minimal calcification in the proximal great vessels. Tunneled right IJ Port-A-Cath tip terminates at the superior cavoatrial junction with port access at this time. Small pericaval lipoma. Major venous structures are otherwise unremarkable. Mediastinum/Nodes: Scattered low-attenuation, subcentimeter mediastinal hilar adenopathy. No pathologically enlarged mediastinal, hilar or axillary nodes. No acute abnormality of the trachea or esophagus. Thyroid gland and thoracic inlet are unremarkable. Lungs/Pleura: There are  peripheral areas wedge-shaped opacity  which could reflect sequela of wedge pulmonary infarcts versus a superimposed infectious process given more diffuse hazy ground-glass opacity throughout the lungs. Mild airways thickening is noted. Trace left effusion is seen. No pneumothorax. Bibasilar areas of atelectasis. Musculoskeletal: Multilevel degenerative changes are present in the imaged portions of the spine. Mild degenerative changes in the shoulders. No acute osseous abnormality or suspicious osseous lesion. No concerning chest wall lesions. Review of the MIP images confirms the above findings. CT ABDOMEN and PELVIS FINDINGS Hepatobiliary: Stable dystrophic capsular calcifications along the posterior right lobe liver, unchanged from comparison. No focal liver abnormality is seen. Patient is post cholecystectomy. Slight prominence of the biliary tree likely related to reservoir effect. No calcified intraductal gallstones. Pancreas: Unremarkable. No pancreatic ductal dilatation or surrounding inflammatory changes. Spleen: Splenomegaly. No concerning focal splenic lesions. Adrenals/Urinary Tract: Mild bilateral symmetric perinephric stranding, a nonspecific finding though may correlate with either age or decreased renal function. Findings unchanged from prior. Fluid attenuation cyst in the interpolar left kidney measuring 3.4 cm, increased in size from comparison but without concerning features. Multiple coarse calcifications are again seen in the left renal collecting system including a small 16 mm developing staghorn calculus seen in the lower renal pelvis with obstruction of the lower pole calices. No other obstructive uropathy or ureteral calculi. Normal urinary bladder. Stomach/Bowel: Distal esophagus, stomach and duodenal sweep are unremarkable. No small bowel wall thickening or dilatation. No evidence of obstruction. A normal appendix is visualized. No colonic dilatation or wall thickening. Scattered colonic  diverticula without focal pericolonic inflammation to suggest diverticulitis. Vascular/Lymphatic: Atherosclerotic plaque within the normal caliber aorta. No suspicious or enlarged lymph nodes in the included lymphatic chains. Few calcified lymph nodes noted in the abdomen, similar comparison study. Reproductive: Coarse eccentric calcification of the prostate. No concerning abnormalities of the prostate or seminal vesicles. Other: No abdominopelvic free fluid or free gas. No bowel containing hernias. Bilateral fat containing inguinal hernias. Musculoskeletal: Multilevel degenerative changes are present in the imaged portions of the spine. 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 No acute osseous abnormality or suspicious osseous lesion. Additional mild degenerative changes in the hips. Review of the MIP images confirms the above findings. IMPRESSION: 1. Suboptimal opacification of the pulmonary arteries (main pulmonary artery bolus measuring 175 HU). However, exam is positive for acute pulmonary emboli throughout the lobar and proximal segmental pulmonary arteries 5 lobes of the lungs with CT evidence of right heart strain (RV/LV Ratio = 1.06) consistent with at least submassive (intermediate risk) PE. The presence of right heart strain has been associated with an increased risk of morbidity and mortality. Please activate Code PE by paging [phone]. 2. Peripheral predominant areas of consolidation some of which have a wedge-shaped appearance which could reflect sequela of wedge pulmonary infarcts versus a superimposed infectious process given a background more diffuse hazy ground-glass opacity throughout the lungs. Trace left effusion. 3. Multiple coarse calcifications in the left renal collecting system including a 16 mm developing staghorn calculus in the lower pole calices with obstruction of the lower pole calices and layering milk of calcium. No other obstructive uropathy or ureteral  calculi. 4. No other acute abnormality in the abdomen or pelvis. 5. Diverticulosis without evidence of acute diverticulitis. 6. Aortic Atherosclerosis (ICD10-I70.0). These results were called by telephone at the time of interpretation on 09/18/2019 at 5:24 pm to provider Merlyn Lot , who verbally acknowledged these results. Electronically Signed   By: Lovena Le M.D.   On: 09/18/2019 17:24  US Venous Img Lower Bilateral (dvt)  Result Date: 09/19/2019 CLINICAL DATA:  Bilateral pulmonary embolism. EXAM: BILATERAL LOWER EXTREMITY VENOUS DOPPLER ULTRASOUND TECHNIQUE: Gray-scale sonography with graded compression, as well as color Doppler and duplex ultrasound were performed to evaluate the lower extremity deep venous systems from the level of the common femoral vein and including the common femoral, femoral, profunda femoral, popliteal and calf veins including the posterior tibial, peroneal and gastrocnemius veins when visible. The superficial great saphenous vein was also interrogated. Spectral Doppler was utilized to evaluate flow at rest and with distal augmentation maneuvers in the common femoral, femoral and popliteal veins. COMPARISON:  None. FINDINGS: RIGHT LOWER EXTREMITY Common Femoral Vein: No evidence of thrombus. Normal compressibility, respiratory phasicity and response to augmentation. Saphenofemoral Junction: No evidence of thrombus. Normal compressibility and flow on color Doppler imaging. Profunda Femoral Vein: No evidence of thrombus. Normal compressibility and flow on color Doppler imaging. Femoral Vein: No evidence of thrombus. Normal compressibility, respiratory phasicity and response to augmentation. Popliteal Vein: There is some nonocclusive thrombus in the right popliteal vein. Calf Veins: There is some nonocclusive thrombus in the right posterior tibial and peroneal veins. Superficial Great Saphenous Vein: No evidence of thrombus. Normal compressibility. Venous Reflux:  None. Other  Findings: No evidence of superficial thrombophlebitis or abnormal fluid collection. LEFT LOWER EXTREMITY Common Femoral Vein: No evidence of thrombus. Normal compressibility, respiratory phasicity and response to augmentation. Saphenofemoral Junction: No evidence of thrombus. Normal compressibility and flow on color Doppler imaging. Profunda Femoral Vein: No evidence of thrombus. Normal compressibility and flow on color Doppler imaging. Femoral Vein: No evidence of thrombus. Normal compressibility, respiratory phasicity and response to augmentation. Popliteal Vein: No evidence of thrombus. Normal compressibility, respiratory phasicity and response to augmentation. Calf Veins: No evidence of thrombus. Normal compressibility and flow on color Doppler imaging. Superficial Great Saphenous Vein: No evidence of thrombus. Normal compressibility. Venous Reflux:  None. Other Findings: No evidence of superficial thrombophlebitis or abnormal fluid collection. IMPRESSION: 1. Nonocclusive thrombus in the right popliteal vein, right posterior tibial vein and right peroneal vein. 2. No evidence of left lower extremity deep venous thrombosis. Electronically Signed   By: Aletta Edouard M.D.   On: 09/19/2019 09:05   Dg Chest Port 1 View  Result Date: 09/18/2019 CLINICAL DATA:  Shortness of breath EXAM: PORTABLE CHEST 1 VIEW COMPARISON:  None. FINDINGS: There is mild cardiomegaly. There is mild prominence to the central pulmonary vasculature. There is blunting of the bilateral costophrenic angles which could be small bilateral pleural effusions, right greater than left. A right-sided MediPort catheter seen with the tip in the mid SVC. No acute osseous abnormality. IMPRESSION: Mild cardiomegaly and central pulmonary vascular congestion. Small bilateral pleural effusions, right greater than left. Electronically Signed   By: Prudencio Pair M.D.   On: 09/18/2019 15:17   Marzetta Board, MD, PhD [facility]  Between 7 am - 7  pm I am available, please contact me via Amion or Securechat  Between 7 pm - 7 am I am not available, please contact night coverage MD/APP via Amion

## 2019-09-19 NOTE — ED Notes (Addendum)
Reported lactic acid 2.9 to attending MD Gherghe. Per MD, no need to recheck at this time. 267ml bolus ordered.

## 2019-09-19 NOTE — ED Notes (Signed)
Pt ambulatory to BR without assistance.  NAD noted, Will continue to monitor.

## 2019-09-19 NOTE — Consult Note (Signed)
[facility] for Heparin Indication: pulmonary embolus  Allergies  Allergen Reactions  . Azithromycin Itching    Patient Measurements: Weight: 272 lb (123.4 kg) Heparin Dosing Weight:   Vital Signs: BP: 115/63 (11/18 0300) Pulse Rate: 89 (11/18 0400)  Labs: Recent Labs    09/18/19 1458 09/18/19 1703 09/18/19 2014 09/19/19 0352  HGB 10.1*  --   --  8.8*  HCT 30.7*  --   --  27.8*  PLT 244  --   --  216  APTT 37*  --   --   --   LABPROT 15.5*  --   --   --   INR 1.2  --   --   --   HEPARINUNFRC  --   --   --  0.25*  CREATININE 0.94  --   --   --   TROPONINIHS 12 10 10 7     Estimated Creatinine Clearance: 104.9 mL/min (by C-G formula based on SCr of 0.94 mg/dL).   Medications:  No PTA anticoagulants, per patient   Assessment: Pharmacy has been consulted for heparin dosing in a patient with a PE.     Goal of Therapy:  Heparin level 0.3-0.7 units/ml Monitor platelets by anticoagulation protocol: Yes   Plan:  11/18 @ 0400 HL 0.25 subtherapeutic. Will rebolus heparin 1400 units IV x 1 and increase rate to 2100 units/hr and will recheck HL @ 1300, hgb dropped to 8.8, probably dilutional, will continue to monitor.  Tobie Lords, PharmD, BCPS Clinical Pharmacist 09/19/2019,5:36 AM

## 2019-09-19 NOTE — ED Notes (Signed)
Pt given lunch tray and a ginger ale. Family at bedside. Call light within reach. Pt has no further needs at this time.

## 2019-09-19 NOTE — Consult Note (Signed)
[facility] for Heparin Indication: pulmonary embolus  Allergies  Allergen Reactions  . Azithromycin Itching    Patient Measurements: Weight: 272 lb (123.4 kg) Heparin Dosing Weight:  Vital Signs: Temp: 99.8 F (37.7 C) (11/18 1245) Temp Source: Oral (11/18 1245) BP: 118/67 (11/18 1245) Pulse Rate: 91 (11/18 1245)  Labs: Recent Labs    09/18/19 1458 09/18/19 1703 09/18/19 2014 09/19/19 0352 09/19/19 1237  HGB 10.1*  --   --  8.8*  --   HCT 30.7*  --   --  27.8*  --   PLT 244  --   --  216  --   APTT 37*  --   --   --   --   LABPROT 15.5*  --   --   --   --   INR 1.2  --   --   --   --   HEPARINUNFRC  --   --   --  0.25* 0.20*  CREATININE 0.94  --   --  0.91  --   TROPONINIHS 12 10 10 7   --     Estimated Creatinine Clearance: 108.3 mL/min (by C-G formula based on SCr of 0.91 mg/dL).   Medications:  No PTA anticoagulants, per patient   Assessment: Pharmacy has been consulted for heparin dosing in a patient with a PE.     Goal of Therapy:  Heparin level 0.3-0.7 units/ml Monitor platelets by anticoagulation protocol: Yes   Plan:  11/18 1237 HL 0.2 subtherapeutic. Spoke with nurse, no issues with line and drip has not been paused. Will bolus heparin 1500 units and increase rate to 2250 units/hr. Repeat HL at 2000. CBC daily.  Dorena Bodo, PharmD Clinical Pharmacist 09/19/2019,1:31 PM

## 2019-09-19 NOTE — Plan of Care (Signed)

## 2019-09-19 NOTE — Progress Notes (Signed)
Pharmacy Antibiotic Note  Eric [name] is a 68 y.o. male admitted on 09/18/2019 with pneumonia.  Pharmacy has been consulted for vanc/cefepime dosing. Patient received vanc 1g and cefepime 2g IV x 1 in the ED.  Plan: Vancomycin 750 mg IV Q 8 hrs. Goal AUC 400-550. Expected AUC: 487.3 SCr used: 0.94 Cssmin: 16.0  Will continue cefepime 2g IV q8h per CrCl > 60 ml/min and will continue to monitor renal function and s/sx of infx and adjust as needed.  Weight: 272 lb (123.4 kg)  Temp (24hrs), Avg:101.7 F (38.7 C), Min:100.1 F (37.8 C), Max:103.2 F (39.6 C)  Recent Labs  Lab 09/18/19 1458 09/19/19 0352  WBC 14.0* 13.4*  CREATININE 0.94  --   LATICACIDVEN 1.3  --     Estimated Creatinine Clearance: 104.9 mL/min (by C-G formula based on SCr of 0.94 mg/dL).    Allergies  Allergen Reactions  . Azithromycin Itching    Thank you for allowing pharmacy to be a part of this patient's care.  Tobie Lords, PharmD, BCPS Clinical Pharmacist 09/19/2019 5:34 AM

## 2019-09-19 NOTE — Progress Notes (Signed)
*  PRELIMINARY RESULTS* Echocardiogram 2D Echocardiogram has been performed.  Sherrie Sport 09/19/2019, 10:03 AM

## 2019-09-19 NOTE — Assessment & Plan Note (Addendum)
#   69 year old male patient with diffuse large B-cell lymphoma currently on chemotherapy is currently admitted to hospital for worsening left-sided chest pain/flank pain-CTA shows bilateral PE/left lower lobe possible infarction  # Diffuse large B-cell lymphoma-ABC subtype-currently 2 cycles of R-CEOP chemotherapy with good clinical response.  We will postpone cycle number 3 x 1 week; given the acute issues/see below.  # Bilateral PE- submassive/right lower extremity nonocclusive DVT.  Currently switched over from IV heparin to Xarelto.  Clinical improvement noted.  Continue Xarelto.  #The above plan of care was discussed with the patient.  Also discussed with the hospitalist service; patient could be discharged home today.  He will follow up with me in 10 days.

## 2019-09-19 NOTE — Consult Note (Signed)
[facility] for Heparin Indication: pulmonary embolus  Allergies  Allergen Reactions  . Allopurinol Rash  . Azithromycin Itching    Patient Measurements: Height: 6' (182.9 cm) Weight: 273 lb 8 oz (124.1 kg) IBW/kg (Calculated) : 77.6 Heparin Dosing Weight:  Vital Signs: Temp: 98.9 F (37.2 C) (11/18 1954) Temp Source: Oral (11/18 1954) BP: 103/70 (11/18 1954) Pulse Rate: 85 (11/18 1954)  Labs: Recent Labs    09/18/19 1458 09/18/19 1703 09/18/19 2014 09/19/19 0352 09/19/19 1237 09/19/19 1944  HGB 10.1*  --   --  8.8*  --   --   HCT 30.7*  --   --  27.8*  --   --   PLT 244  --   --  216  --   --   APTT 37*  --   --   --   --   --   LABPROT 15.5*  --   --   --   --   --   INR 1.2  --   --   --   --   --   HEPARINUNFRC  --   --   --  0.25* 0.20* 0.43  CREATININE 0.94  --   --  0.91  --   --   TROPONINIHS 12 10 10 7   --   --     Estimated Creatinine Clearance: 108.7 mL/min (by C-G formula based on SCr of 0.91 mg/dL).   Medications:  No PTA anticoagulants, per patient   Assessment: Pharmacy has been consulted for heparin dosing in a patient with a PE.    Goal of Therapy:  Heparin level 0.3-0.7 units/ml Monitor platelets by anticoagulation protocol: Yes   Plan:  11/18 1944 HL 0.43 therapeutic x1. Spoke with nurse, drip has not been interrupted. Will continue heparin 2250 units/hr. Repeat HL in 6 hours. CBC daily.  Kristeen Miss, PharmD Clinical Pharmacist 09/19/2019,8:36 PM

## 2019-09-19 NOTE — Consult Note (Signed)
[place] CONSULT NOTE  Patient Care Team: [name], MD as PCP - General (Family Medicine) [name], [name], MD (General Surgery)  CHIEF COMPLAINTS/PURPOSE OF CONSULTATION:  Lymphoma/pulmonary embolism  HISTORY OF PRESENTING ILLNESS:  Eric [name] 68 y.o.  male history of diffuse B-cell lymphoma stage IV currently on chemotherapy is currently admitted hospital for left-sided chest pain/flank pain.  Patient is currently status post 2 cycles of R-CEOP chemotherapy for his history history of diffuse large B cell lymphoma.  Patient had significant response from chemotherapy clinically.  Patient is due for cycle #4 next week.  Patient on the day prior to admission noted to have increasing/sudden stabbing chest pain; pleuritic in nature.  Mild to moderate difficulty breathing; hemoptysis.  No cough.  Temperature up to 102.  Patient was evaluated in the cancer center-given the clinical severity referred to the emergency room.  Emergency room patient had a CTA that showed bilateral pulmonary embolism; without any significant hemodynamic instability.  Patient is on IV heparin.   Patient continues to complain of mild left chest pain especially with movement/deep breathing.   Review of Systems  Constitutional: Positive for fever and malaise/fatigue. Negative for chills, diaphoresis and weight loss.  HENT: Negative for nosebleeds and sore throat.   Eyes: Negative for double vision.  Respiratory: Positive for cough and shortness of breath. Negative for hemoptysis, sputum production and wheezing.   Cardiovascular: Negative for chest pain, palpitations, orthopnea and leg swelling.  Gastrointestinal: Positive for abdominal pain. Negative for blood in stool, constipation, diarrhea, heartburn, melena, nausea and vomiting.  Genitourinary: Negative for dysuria, frequency and urgency.  Musculoskeletal: Negative for back pain and joint pain.  Skin: Negative.  Negative for  itching and rash.  Neurological: Negative for dizziness, tingling, focal weakness, weakness and headaches.  Endo/Heme/Allergies: Does not bruise/bleed easily.  Psychiatric/Behavioral: Negative for depression. The patient is not nervous/anxious and does not have insomnia.      MEDICAL HISTORY:  Past Medical History:  Diagnosis Date  . Colon polyps   . Pancreatitis   . Rectal bleeding 02/24/14    SURGICAL HISTORY: Past Surgical History:  Procedure Laterality Date  . CHOLECYSTECTOMY    . COLONOSCOPY  02/28/14  . IR IMAGING GUIDED PORT INSERTION  07/18/2019  . POLYPECTOMY     colon polyp removed  . TONSILLECTOMY      SOCIAL HISTORY: Social History   Socioeconomic History  . Marital status: Married    Spouse name: Not on file  . Number of children: Not on file  . Years of education: Not on file  . Highest education level: Not on file  Occupational History    Comment: Retired from [employer]  . Financial resource strain: Not on file  . Food insecurity    Worry: Not on file    Inability: Not on file  . Transportation needs    Medical: Not on file    Non-medical: Not on file  Tobacco Use  . Smoking status: Current Every Day Smoker    Packs/day: 1.00    Years: 20.00    Pack years: 20.00    Types: Cigars  . Smokeless tobacco: Never Used  . Tobacco comment: smoke 1/2 ppd cigaretes since 68 yo. changed to cigars at age July 2004.   Substance and Sexual Activity  . Alcohol use: Yes    Comment: 6-7 drinks weekly  . Drug use: No  . Sexual activity: Not on file  Lifestyle  . Physical  activity    Days per week: Not on file    Minutes per session: Not on file  . Stress: Not on file  Relationships  . Social Herbalist on phone: Not on file    Gets together: Not on file    Attends religious service: Not on file    Active member of club or organization: Not on file    Attends meetings of clubs or organizations: Not on file    Relationship status: Not  on file  . Intimate partner violence    Fear of current or ex partner: Not on file    Emotionally abused: Not on file    Physically abused: Not on file    Forced sexual activity: Not on file  Other Topics Concern  . Not on file  Social History Narrative   Lives at home with wife; in [place]. retd from [employer] smoker; drinks liquor. Children - grown up.     FAMILY HISTORY: Family History  Adopted: Yes    ALLERGIES:  is allergic to allopurinol and azithromycin.  MEDICATIONS:  Current Facility-Administered Medications  Medication Dose Route Frequency Provider Last Rate Last Dose  . acetaminophen (TYLENOL) tablet 650 mg  650 mg Oral Q6H PRN Toy Baker, MD       Or  . acetaminophen (TYLENOL) suppository 650 mg  650 mg Rectal Q6H PRN Doutova, Anastassia, MD      . heparin ADULT infusion 100 units/mL (25000 units/259mL sodium chloride 0.45%)  2,250 Units/hr Intravenous Continuous Caren Griffins, MD 22.5 mL/hr at 09/19/19 1415 2,250 Units/hr at 09/19/19 1415  . HYDROcodone-acetaminophen (NORCO/VICODIN) 5-325 MG per tablet 1-2 tablet  1-2 tablet Oral Q4H PRN Doutova, Anastassia, MD      . levofloxacin (LEVAQUIN) IVPB 750 mg  750 mg Intravenous Q24H Gherghe, Costin M, MD      . metroNIDAZOLE (FLAGYL) IVPB 500 mg  500 mg Intravenous Q8H Toy Baker, MD   Stopped at 09/19/19 1342  . ondansetron (ZOFRAN) tablet 4 mg  4 mg Oral Q6H PRN Doutova, Anastassia, MD       Or  . ondansetron (ZOFRAN) injection 4 mg  4 mg Intravenous Q6H PRN Toy Baker, MD       Current Outpatient Medications  Medication Sig Dispense Refill  . carvedilol (COREG) 6.25 MG tablet Take 1 tablet (6.25 mg total) by mouth 2 (two) times daily. 180 tablet 3  . lidocaine-prilocaine (EMLA) cream Apply generously on the port 30-45 prior to accessing the port. 30 g 0  . Multiple Vitamins-Minerals (MULTI-VITAMIN/MINERALS PO) Take 1 capsule by mouth.        .  PHYSICAL  EXAMINATION:  Vitals:   09/19/19 1400 09/19/19 1430  BP:    Pulse:  95  Resp: 20   Temp:    SpO2:  100%   Filed Weights   09/18/19 1725  Weight: 272 lb (123.4 kg)    Physical Exam  Constitutional: He is oriented to person, place, and time and well-developed, well-nourished, and in no distress.  Patient getting up moving around by himself.  However difficulty catching breath/pain on deep breath.  HENT:  Head: Normocephalic and atraumatic.  Mouth/Throat: Oropharynx is clear and moist. No oropharyngeal exudate.  Eyes: Pupils are equal, round, and reactive to light.  Neck: Normal range of motion. Neck supple.  Cardiovascular: Normal rate and regular rhythm.  Pulmonary/Chest: Effort normal and breath sounds normal. No respiratory distress. He has no wheezes.  Abdominal: Soft. Bowel sounds  are normal. He exhibits no distension and no mass. There is no abdominal tenderness. There is no rebound and no guarding.  Musculoskeletal: Normal range of motion.        General: No tenderness or edema.  Neurological: He is alert and oriented to person, place, and time.  Skin: Skin is warm.  Psychiatric: Affect normal.   LABORATORY DATA:  I have reviewed the data as listed Lab Results  Component Value Date   WBC 13.4 (H) 09/19/2019   HGB 8.8 (L) 09/19/2019   HCT 27.8 (L) 09/19/2019   MCV 86.9 09/19/2019   PLT 216 09/19/2019   Recent Labs    08/13/19 0815 09/03/19 0838 09/18/19 1458 09/19/19 0352  NA 136 135 136 138  K 4.0 3.9 4.0 3.9  CL 101 103 105 105  CO2 26 23 23  21*  GLUCOSE 103* 104* 120* 101*  BUN 19 19 20 18   CREATININE 0.92 0.90 0.94 0.91  CALCIUM 8.9 8.8* 8.8* 8.2*  GFRNONAA >60 >60 >60 >60  GFRAA >60 >60 >60 >60  PROT 6.9 6.5 6.6  --   ALBUMIN 3.5 3.4* 3.4*  --   AST 18 22 29   --   ALT 20 23 32  --   ALKPHOS 77 64 71  --   BILITOT 0.7 0.7 1.3*  --     RADIOGRAPHIC STUDIES: I have personally reviewed the radiological images as listed and agreed with the  findings in the report. Ct Angio Chest Pe W And/or Wo Contrast  Result Date: 09/18/2019 CLINICAL DATA:  Severe pain, left axilla worsened with deep inspiration, PE suspected, high pretest probability additionally, acute generalized abdominal pain with fever worse in the left upper quadrant and left flank and lumbar ways CT EXAM: CT ANGIOGRAPHY CHEST CT ABDOMEN AND PELVIS WITH CONTRAST TECHNIQUE: Multidetector CT imaging of the chest was performed using the standard protocol during bolus administration of intravenous contrast. Multiplanar CT image reconstructions and MIPs were obtained to evaluate the vascular anatomy. Multidetector CT imaging of the abdomen and pelvis was performed using the standard protocol during bolus administration of intravenous contrast. CONTRAST:  95mL OMNIPAQUE IOHEXOL 350 MG/ML SOLN COMPARISON:  CT abdomen pelvis 02/24/2014 FINDINGS: CTA CHEST FINDINGS Cardiovascular: Suboptimal opacification of pulmonary arteries (main pulmonary artery bolus measuring 175 HU). Such finding may limit evaluation of the segmental and subsegmental pulmonary arteries. However, there is extensive pulmonary arterial filling defect seen throughout the lobar and proximal segmental pulmonary arteries of all 5 lobes mild central pulmonary artery enlargement is noted as well as elevation of the RV/LV ratio (1.06). Mild cardiomegaly. No pericardial effusion. Few coronary artery calcifications are present. Normal caliber thoracic aorta with scant atherosclerotic plaque. Normal 3 vessel branching of the arch with minimal calcification in the proximal great vessels. Tunneled right IJ Port-A-Cath tip terminates at the superior cavoatrial junction with port access at this time. Small pericaval lipoma. Major venous structures are otherwise unremarkable. Mediastinum/Nodes: Scattered low-attenuation, subcentimeter mediastinal hilar adenopathy. No pathologically enlarged mediastinal, hilar or axillary nodes. No acute  abnormality of the trachea or esophagus. Thyroid gland and thoracic inlet are unremarkable. Lungs/Pleura: There are peripheral areas wedge-shaped opacity which could reflect sequela of wedge pulmonary infarcts versus a superimposed infectious process given more diffuse hazy ground-glass opacity throughout the lungs. Mild airways thickening is noted. Trace left effusion is seen. No pneumothorax. Bibasilar areas of atelectasis. Musculoskeletal: Multilevel degenerative changes are present in the imaged portions of the spine. Mild degenerative changes in the shoulders. No acute osseous  abnormality or suspicious osseous lesion. No concerning chest wall lesions. Review of the MIP images confirms the above findings. CT ABDOMEN and PELVIS FINDINGS Hepatobiliary: Stable dystrophic capsular calcifications along the posterior right lobe liver, unchanged from comparison. No focal liver abnormality is seen. Patient is post cholecystectomy. Slight prominence of the biliary tree likely related to reservoir effect. No calcified intraductal gallstones. Pancreas: Unremarkable. No pancreatic ductal dilatation or surrounding inflammatory changes. Spleen: Splenomegaly. No concerning focal splenic lesions. Adrenals/Urinary Tract: Mild bilateral symmetric perinephric stranding, a nonspecific finding though may correlate with either age or decreased renal function. Findings unchanged from prior. Fluid attenuation cyst in the interpolar left kidney measuring 3.4 cm, increased in size from comparison but without concerning features. Multiple coarse calcifications are again seen in the left renal collecting system including a small 16 mm developing staghorn calculus seen in the lower renal pelvis with obstruction of the lower pole calices. No other obstructive uropathy or ureteral calculi. Normal urinary bladder. Stomach/Bowel: Distal esophagus, stomach and duodenal sweep are unremarkable. No small bowel wall thickening or dilatation. No  evidence of obstruction. A normal appendix is visualized. No colonic dilatation or wall thickening. Scattered colonic diverticula without focal pericolonic inflammation to suggest diverticulitis. Vascular/Lymphatic: Atherosclerotic plaque within the normal caliber aorta. No suspicious or enlarged lymph nodes in the included lymphatic chains. Few calcified lymph nodes noted in the abdomen, similar comparison study. Reproductive: Coarse eccentric calcification of the prostate. No concerning abnormalities of the prostate or seminal vesicles. Other: No abdominopelvic free fluid or free gas. No bowel containing hernias. Bilateral fat containing inguinal hernias. Musculoskeletal: Multilevel degenerative changes are present in the imaged portions of the spine. 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 No acute osseous abnormality or suspicious osseous lesion. Additional mild degenerative changes in the hips. Review of the MIP images confirms the above findings. IMPRESSION: 1. Suboptimal opacification of the pulmonary arteries (main pulmonary artery bolus measuring 175 HU). However, exam is positive for acute pulmonary emboli throughout the lobar and proximal segmental pulmonary arteries 5 lobes of the lungs with CT evidence of right heart strain (RV/LV Ratio = 1.06) consistent with at least submassive (intermediate risk) PE. The presence of right heart strain has been associated with an increased risk of morbidity and mortality. Please activate Code PE by paging [phone]. 2. Peripheral predominant areas of consolidation some of which have a wedge-shaped appearance which could reflect sequela of wedge pulmonary infarcts versus a superimposed infectious process given a background more diffuse hazy ground-glass opacity throughout the lungs. Trace left effusion. 3. Multiple coarse calcifications in the left renal collecting system including a 16 mm developing staghorn calculus in the lower pole calices  with obstruction of the lower pole calices and layering milk of calcium. No other obstructive uropathy or ureteral calculi. 4. No other acute abnormality in the abdomen or pelvis. 5. Diverticulosis without evidence of acute diverticulitis. 6. Aortic Atherosclerosis (ICD10-I70.0). These results were called by telephone at the time of interpretation on 09/18/2019 at 5:24 pm to provider Merlyn Lot , who verbally acknowledged these results. Electronically Signed   By: Lovena Le M.D.   On: 09/18/2019 17:24   Nm Cardiac Muga Rest  Result Date: 08/21/2019 CLINICAL DATA:  High-grade lymphoma.  Dilated cardiomyopathy. EXAM: NUCLEAR MEDICINE CARDIAC BLOOD POOL IMAGING (MUGA) TECHNIQUE: Cardiac multi-gated acquisition was performed at rest following intravenous injection of Tc-17m labeled red blood cells. RADIOPHARMACEUTICALS:  24.0 mCi Tc-7m pertechnetate in-vitro labeled red blood cells IV COMPARISON:  Myocardial perfusion exam 08/10/2019, PET-CT  scan 07/10/2019 FINDINGS: No  focal wall motion abnormality of the left ventricle. Calculated left ventricular ejection fraction equals 39.8% IMPRESSION: Left ventricular ejection fraction equals39.8 %. Electronically Signed   By: Suzy Bouchard M.D.   On: 08/21/2019 12:16   Ct Abdomen Pelvis W Contrast  Result Date: 09/18/2019 CLINICAL DATA:  Severe pain, left axilla worsened with deep inspiration, PE suspected, high pretest probability additionally, acute generalized abdominal pain with fever worse in the left upper quadrant and left flank and lumbar ways CT EXAM: CT ANGIOGRAPHY CHEST CT ABDOMEN AND PELVIS WITH CONTRAST TECHNIQUE: Multidetector CT imaging of the chest was performed using the standard protocol during bolus administration of intravenous contrast. Multiplanar CT image reconstructions and MIPs were obtained to evaluate the vascular anatomy. Multidetector CT imaging of the abdomen and pelvis was performed using the standard protocol during bolus  administration of intravenous contrast. CONTRAST:  60mL OMNIPAQUE IOHEXOL 350 MG/ML SOLN COMPARISON:  CT abdomen pelvis 02/24/2014 FINDINGS: CTA CHEST FINDINGS Cardiovascular: Suboptimal opacification of pulmonary arteries (main pulmonary artery bolus measuring 175 HU). Such finding may limit evaluation of the segmental and subsegmental pulmonary arteries. However, there is extensive pulmonary arterial filling defect seen throughout the lobar and proximal segmental pulmonary arteries of all 5 lobes mild central pulmonary artery enlargement is noted as well as elevation of the RV/LV ratio (1.06). Mild cardiomegaly. No pericardial effusion. Few coronary artery calcifications are present. Normal caliber thoracic aorta with scant atherosclerotic plaque. Normal 3 vessel branching of the arch with minimal calcification in the proximal great vessels. Tunneled right IJ Port-A-Cath tip terminates at the superior cavoatrial junction with port access at this time. Small pericaval lipoma. Major venous structures are otherwise unremarkable. Mediastinum/Nodes: Scattered low-attenuation, subcentimeter mediastinal hilar adenopathy. No pathologically enlarged mediastinal, hilar or axillary nodes. No acute abnormality of the trachea or esophagus. Thyroid gland and thoracic inlet are unremarkable. Lungs/Pleura: There are peripheral areas wedge-shaped opacity which could reflect sequela of wedge pulmonary infarcts versus a superimposed infectious process given more diffuse hazy ground-glass opacity throughout the lungs. Mild airways thickening is noted. Trace left effusion is seen. No pneumothorax. Bibasilar areas of atelectasis. Musculoskeletal: Multilevel degenerative changes are present in the imaged portions of the spine. Mild degenerative changes in the shoulders. No acute osseous abnormality or suspicious osseous lesion. No concerning chest wall lesions. Review of the MIP images confirms the above findings. CT ABDOMEN and PELVIS  FINDINGS Hepatobiliary: Stable dystrophic capsular calcifications along the posterior right lobe liver, unchanged from comparison. No focal liver abnormality is seen. Patient is post cholecystectomy. Slight prominence of the biliary tree likely related to reservoir effect. No calcified intraductal gallstones. Pancreas: Unremarkable. No pancreatic ductal dilatation or surrounding inflammatory changes. Spleen: Splenomegaly. No concerning focal splenic lesions. Adrenals/Urinary Tract: Mild bilateral symmetric perinephric stranding, a nonspecific finding though may correlate with either age or decreased renal function. Findings unchanged from prior. Fluid attenuation cyst in the interpolar left kidney measuring 3.4 cm, increased in size from comparison but without concerning features. Multiple coarse calcifications are again seen in the left renal collecting system including a small 16 mm developing staghorn calculus seen in the lower renal pelvis with obstruction of the lower pole calices. No other obstructive uropathy or ureteral calculi. Normal urinary bladder. Stomach/Bowel: Distal esophagus, stomach and duodenal sweep are unremarkable. No small bowel wall thickening or dilatation. No evidence of obstruction. A normal appendix is visualized. No colonic dilatation or wall thickening. Scattered colonic diverticula without focal pericolonic inflammation to suggest diverticulitis. Vascular/Lymphatic: Atherosclerotic plaque  within the normal caliber aorta. No suspicious or enlarged lymph nodes in the included lymphatic chains. Few calcified lymph nodes noted in the abdomen, similar comparison study. Reproductive: Coarse eccentric calcification of the prostate. No concerning abnormalities of the prostate or seminal vesicles. Other: No abdominopelvic free fluid or free gas. No bowel containing hernias. Bilateral fat containing inguinal hernias. Musculoskeletal: Multilevel degenerative changes are present in the imaged  portions of the spine. 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 No acute osseous abnormality or suspicious osseous lesion. Additional mild degenerative changes in the hips. Review of the MIP images confirms the above findings. IMPRESSION: 1. Suboptimal opacification of the pulmonary arteries (main pulmonary artery bolus measuring 175 HU). However, exam is positive for acute pulmonary emboli throughout the lobar and proximal segmental pulmonary arteries 5 lobes of the lungs with CT evidence of right heart strain (RV/LV Ratio = 1.06) consistent with at least submassive (intermediate risk) PE. The presence of right heart strain has been associated with an increased risk of morbidity and mortality. Please activate Code PE by paging [phone]. 2. Peripheral predominant areas of consolidation some of which have a wedge-shaped appearance which could reflect sequela of wedge pulmonary infarcts versus a superimposed infectious process given a background more diffuse hazy ground-glass opacity throughout the lungs. Trace left effusion. 3. Multiple coarse calcifications in the left renal collecting system including a 16 mm developing staghorn calculus in the lower pole calices with obstruction of the lower pole calices and layering milk of calcium. No other obstructive uropathy or ureteral calculi. 4. No other acute abnormality in the abdomen or pelvis. 5. Diverticulosis without evidence of acute diverticulitis. 6. Aortic Atherosclerosis (ICD10-I70.0). These results were called by telephone at the time of interpretation on 09/18/2019 at 5:24 pm to provider Merlyn Lot , who verbally acknowledged these results. Electronically Signed   By: Lovena Le M.D.   On: 09/18/2019 17:24   US Venous Img Lower Bilateral (dvt)  Result Date: 09/19/2019 CLINICAL DATA:  Bilateral pulmonary embolism. EXAM: BILATERAL LOWER EXTREMITY VENOUS DOPPLER ULTRASOUND TECHNIQUE: Gray-scale sonography with graded  compression, as well as color Doppler and duplex ultrasound were performed to evaluate the lower extremity deep venous systems from the level of the common femoral vein and including the common femoral, femoral, profunda femoral, popliteal and calf veins including the posterior tibial, peroneal and gastrocnemius veins when visible. The superficial great saphenous vein was also interrogated. Spectral Doppler was utilized to evaluate flow at rest and with distal augmentation maneuvers in the common femoral, femoral and popliteal veins. COMPARISON:  None. FINDINGS: RIGHT LOWER EXTREMITY Common Femoral Vein: No evidence of thrombus. Normal compressibility, respiratory phasicity and response to augmentation. Saphenofemoral Junction: No evidence of thrombus. Normal compressibility and flow on color Doppler imaging. Profunda Femoral Vein: No evidence of thrombus. Normal compressibility and flow on color Doppler imaging. Femoral Vein: No evidence of thrombus. Normal compressibility, respiratory phasicity and response to augmentation. Popliteal Vein: There is some nonocclusive thrombus in the right popliteal vein. Calf Veins: There is some nonocclusive thrombus in the right posterior tibial and peroneal veins. Superficial Great Saphenous Vein: No evidence of thrombus. Normal compressibility. Venous Reflux:  None. Other Findings: No evidence of superficial thrombophlebitis or abnormal fluid collection. LEFT LOWER EXTREMITY Common Femoral Vein: No evidence of thrombus. Normal compressibility, respiratory phasicity and response to augmentation. Saphenofemoral Junction: No evidence of thrombus. Normal compressibility and flow on color Doppler imaging. Profunda Femoral Vein: No evidence of thrombus. Normal compressibility and flow on color Doppler imaging.  Femoral Vein: No evidence of thrombus. Normal compressibility, respiratory phasicity and response to augmentation. Popliteal Vein: No evidence of thrombus. Normal  compressibility, respiratory phasicity and response to augmentation. Calf Veins: No evidence of thrombus. Normal compressibility and flow on color Doppler imaging. Superficial Great Saphenous Vein: No evidence of thrombus. Normal compressibility. Venous Reflux:  None. Other Findings: No evidence of superficial thrombophlebitis or abnormal fluid collection. IMPRESSION: 1. Nonocclusive thrombus in the right popliteal vein, right posterior tibial vein and right peroneal vein. 2. No evidence of left lower extremity deep venous thrombosis. Electronically Signed   By: Aletta Edouard M.D.   On: 09/19/2019 09:05   Dg Chest Port 1 View  Result Date: 09/18/2019 CLINICAL DATA:  Shortness of breath EXAM: PORTABLE CHEST 1 VIEW COMPARISON:  None. FINDINGS: There is mild cardiomegaly. There is mild prominence to the central pulmonary vasculature. There is blunting of the bilateral costophrenic angles which could be small bilateral pleural effusions, right greater than left. A right-sided MediPort catheter seen with the tip in the mid SVC. No acute osseous abnormality. IMPRESSION: Mild cardiomegaly and central pulmonary vascular congestion. Small bilateral pleural effusions, right greater than left. Electronically Signed   By: Prudencio Pair M.D.   On: 09/18/2019 15:17    Bilateral pulmonary embolism ([place]) #68 year old male patient with diffuse large B-cell lymphoma currently on chemotherapy is currently admitted to hospital for worsening left-sided chest pain/flank pain-CTA shows bilateral PE/left lower lobe possible infarction  # Diffuse large B-cell lymphoma-ABC subtype-currently 2 cycles of R-CEOP chemotherapy with good clinical response.  Patient due for cycle #3 next week; for now await resolution of acute issues/see below  #Bilateral PE-submassive-discussed the risk factors including chemotherapy and leg malignancy as a cause blood clots.  Lower external Dopplers pending.  Agree with IV heparin/until clinical  improvement-24 to 48 hours.  And patient will be transition to Eliquis/Xarelto at the time of discharge.  Discussed that would recommend at least 1 year of anticoagulation/or may be longer based upon the status of his underlying malignancy.  #Fatigue-likely secondary to underlying lymphoma/anemia from chemotherapy; unlikely from beta-blocker.  Patient should start back on Coreg given his low ejection fraction.   # Thank you Dr.Gherghe for allowing me to participate in the care of your pleasant patient. Please do not hesitate to contact me with questions or concerns in the interim.  We will follow the patient closely.  All questions were answered. The patient knows to call the clinic with any problems, questions or concerns.    Cammie Sickle, MD 09/19/2019 3:54 PM

## 2019-09-19 NOTE — ED Notes (Signed)
Pt assissted to bathroom with no issue. Spouse given recliner chair to recline in. No further needs addressed. Will continue to monitor.

## 2019-09-19 NOTE — Consult Note (Signed)
[facility] Consult Note  MRN : [id number]  Fay Stefko is a 68 y.o. (09-17-1951) male who presents with chief complaint of fever and chest pain.  History of Present Illness: I am asked to evaluate the patient by Dr. Quentin Cornwall.  Patient is a 68 year old male with known non-Hodgkin's lymphoma who is currently on chemotherapy.  He presented to the emergency room yesterday with shortness of breath as well as fevers and an elevated white count.  During his work-up CT angiogram was done which demonstrated multiple pulmonary emboli at the lobar level.  Location: He notes the majority the pain is left-sided and in his chest Character/quality of the symptom: He describes as quite sharp and stabbing Severity: He rates it very severe Duration: Initially it was continuous it has tapered off this morning at the time of my interview Timing/onset: It began approximately a day and a half ago and was acute on onset Aggravating/context: Taking deep breaths makes it worse Relieving/modifying: Laying down seems to make it better Current Meds  Medication Sig  . carvedilol (COREG) 6.25 MG tablet Take 1 tablet (6.25 mg total) by mouth 2 (two) times daily.  Marland Kitchen lidocaine-prilocaine (EMLA) cream Apply generously on the port 30-45 prior to accessing the port.  . Multiple Vitamins-Minerals (MULTI-VITAMIN/MINERALS PO) Take 1 capsule by mouth.    Past Medical History:  Diagnosis Date  . Colon polyps   . Pancreatitis   . Rectal bleeding 02/24/14    Past Surgical History:  Procedure Laterality Date  . CHOLECYSTECTOMY    . COLONOSCOPY  02/28/14  . IR IMAGING GUIDED PORT INSERTION  07/18/2019  . POLYPECTOMY     colon polyp removed  . TONSILLECTOMY      Social History Social History   Tobacco Use  . Smoking status: Current Every Day Smoker    Packs/day: 1.00    Years: 20.00    Pack years: 20.00    Types: Cigars  . Smokeless tobacco: Never Used  . Tobacco comment:  smoke 1/2 ppd cigaretes since 68 yo. changed to cigars at age July 2004.   Substance Use Topics  . Alcohol use: Yes    Comment: 6-7 drinks weekly  . Drug use: No    Family History Family History  Adopted: Yes    Allergies  Allergen Reactions  . Allopurinol Rash  . Azithromycin Itching     REVIEW OF SYSTEMS (Negative unless checked)  Constitutional: [] Weight loss  [x] Fever  [x] Chills Cardiac: [] Chest pain   [] Chest pressure   [] Palpitations   [x] Shortness of breath when laying flat   [] Shortness of breath at rest   [x] Shortness of breath with exertion. Vascular:  [] Pain in legs with walking   [] Pain in legs at rest   [] Pain in legs when laying flat   [] Claudication   [] Pain in feet when walking  [] Pain in feet at rest  [] Pain in feet when laying flat   [] History of DVT   [] Phlebitis   [] Swelling in legs   [] Varicose veins   [] Non-healing ulcers Pulmonary:   [] Uses home oxygen   [] Productive cough   [] Hemoptysis   [] Wheeze  [] COPD   [] Asthma Neurologic:  [] Dizziness  [] Blackouts   [] Seizures   [] History of stroke   [] History of TIA  [] Aphasia   [] Temporary blindness   [] Dysphagia   [] Weakness or numbness in arms   [] Weakness or numbness in legs Musculoskeletal:  [] Arthritis   [] Joint swelling   [] Joint pain   [] Low  back pain Hematologic:  [] Easy bruising  [] Easy bleeding   [] Hypercoagulable state   [] Anemic  [] Hepatitis Gastrointestinal:  [] Blood in stool   [] Vomiting blood  [] Gastroesophageal reflux/heartburn   [] Difficulty swallowing. Genitourinary:  [] Chronic kidney disease   [] Difficult urination  [] Frequent urination  [] Burning with urination   [] Blood in urine Skin:  [] Rashes   [] Ulcers   [] Wounds Psychological:  [] History of anxiety   []  History of major depression.    Physical Examination  Vitals:   09/19/19 1430 09/19/19 1500 09/19/19 1600 09/19/19 1611  BP:  (!) 106/59 (!) 96/48   Pulse: 95 89 87   Resp:   20   Temp:    (S) (!) 101.7 F (38.7 C)  TempSrc:    Oral   SpO2: 100% 97% 95%   Weight:       Body mass index is 36.89 kg/m.  Head: [place]/AT, No temporalis wasting.  Ear/Nose/Throat: Nares w/o erythema or drainage, oropharynx w/o obsrtuction,   Eyes: PERRLA, Sclera nonicteric.  Neck: Supple, no nuchal rigidity.  No  JVD.  Pulmonary: No audible wheezing, no use of accessory muscles.  Cardiac: RRR,  Gastrointestinal: No peritoneal signs, non-distended.  Musculoskeletal: Moves all extremities.  No deformity or atrophy. No edema. Neurologic: CN 2-12 intact. Symmetrical.  Speech is fluent.  Psychiatric: Judgment intact, Mood & affect appropriate for pt's clinical situation. Dermatologic: No rashes or ulcers noted.  No cellulitis or open wounds.     CBC Lab Results  Component Value Date   WBC 13.4 (H) 09/19/2019   HGB 8.8 (L) 09/19/2019   HCT 27.8 (L) 09/19/2019   MCV 86.9 09/19/2019   PLT 216 09/19/2019    BMET    Component Value Date/Time   NA 138 09/19/2019 0352   NA 140 02/24/2014 1106   K 3.9 09/19/2019 0352   K 3.9 02/24/2014 1106   CL 105 09/19/2019 0352   CL 108 (H) 02/24/2014 1106   CO2 21 (L) 09/19/2019 0352   CO2 25 02/24/2014 1106   GLUCOSE 101 (H) 09/19/2019 0352   GLUCOSE 106 (H) 02/24/2014 1106   BUN 18 09/19/2019 0352   BUN 21 (H) 02/24/2014 1106   CREATININE 0.91 09/19/2019 0352   CREATININE 0.89 02/24/2014 1106   CALCIUM 8.2 (L) 09/19/2019 0352   CALCIUM 8.6 02/24/2014 1106   GFRNONAA >60 09/19/2019 0352   GFRNONAA >60 02/24/2014 1106   GFRAA >60 09/19/2019 0352   GFRAA >60 02/24/2014 1106   Estimated Creatinine Clearance: 108.3 mL/min (by C-G formula based on SCr of 0.91 mg/dL).  COAG Lab Results  Component Value Date   INR 1.2 09/18/2019   INR 1.1 07/18/2019   INR 1.0 02/24/2014    Radiology I have personally reviewed the CT.  From a vascular standpoint there are multiple pulmonary emboli they are fairly distal essentially at the lobar level.  There is mild enlargement of the right  ventricle   Assessment/Plan 1.  Pulmonary embolism: The patient has a more distal pattern for his pulmonary emboli this is less amenable to intervention.  His troponins are normal and his right heart strain is quite minimal especially given his history of dilated cardiomyopathy.  At this point I do not recommend thrombolysis or pulmonary thrombectomy.  I concur completely with anticoagulation with heparin initially and transitioning to oral agents.  I also do not feel that an IVC filter is indicated at this time as there is no indication that he will not tolerate his anticoagulation therapy.  I  did mention IVC filter to the patient and my discussion with him and explained the reason it is not indicated at this time point.  2.  Sepsis: Patient does meet the criteria for an infectious process with sepsis.  His Covid test has come back at this point and is negative.  He has been initiated on broad-spectrum IV antibiotics.  Further plans per the primary medical service  3.  Lymphoma: The patient follows with Dr. Rogue Bussing who has seen him in consultation this admission.  Further plans per the oncology service   Hortencia Pilar, MD  09/19/2019 4:39 PM

## 2019-09-19 NOTE — Progress Notes (Signed)
PT Cancellation Note  Patient Details Name: Eric [name] MRN: [id number] DOB: [birth year]   Cancelled Treatment:    Reason Eval/Treat Not Completed: Medical issues which prohibited therapy.  PT consult received.  Chart reviewed.  Imaging showing acute PE; therapeutic dose of anticoagulation initiated on 09/18/19 at 1740.  Per PT protocol, will hold PT eval for 48 hours after therapeutic dose of anticoagulation initiated and pt is medically appropriate for physical therapy evaluation.  Leitha Bleak, PT 09/19/19, 11:44 AM [phone]

## 2019-09-20 LAB — COMPREHENSIVE METABOLIC PANEL
ALT: 29 U/L (ref 0–44)
AST: 23 U/L (ref 15–41)
Albumin: 2.8 g/dL — ABNORMAL LOW (ref 3.5–5.0)
Alkaline Phosphatase: 65 U/L (ref 38–126)
Anion gap: 8 (ref 5–15)
BUN: 17 mg/dL (ref 8–23)
CO2: 22 mmol/L (ref 22–32)
Calcium: 8.3 mg/dL — ABNORMAL LOW (ref 8.9–10.3)
Chloride: 108 mmol/L (ref 98–111)
Creatinine, Ser: 0.81 mg/dL (ref 0.61–1.24)
GFR calc Af Amer: >60 mL/min (ref >60)
GFR calc non Af Amer: >60 mL/min (ref >60)
Glucose, Bld: 105 mg/dL — ABNORMAL HIGH (ref 70–99)
Potassium: 3.8 mmol/L (ref 3.5–5.1)
Sodium: 138 mmol/L (ref 135–145)
Total Bilirubin: 0.9 mg/dL (ref 0.3–1.2)
Total Protein: 5.7 g/dL — ABNORMAL LOW (ref 6.5–8.1)

## 2019-09-20 LAB — CBC
HCT: 27 % — ABNORMAL LOW (ref 39.0–52.0)
Hemoglobin: 8.5 g/dL — ABNORMAL LOW (ref 13.0–17.0)
MCH: 27 pg (ref 26.0–34.0)
MCHC: 31.5 g/dL (ref 30.0–36.0)
MCV: 85.7 fL (ref 80.0–100.0)
Platelets: 224 10*3/uL (ref 150–400)
RBC: 3.15 MIL/uL — ABNORMAL LOW (ref 4.22–5.81)
RDW: 18.5 % — ABNORMAL HIGH (ref 11.5–15.5)
WBC: 11 10*3/uL — ABNORMAL HIGH (ref 4.0–10.5)
nRBC: 0 % (ref 0.0–0.2)

## 2019-09-20 LAB — HEPARIN LEVEL (UNFRACTIONATED): Heparin Unfractionated: 0.22 IU/mL — ABNORMAL LOW (ref 0.30–0.70)

## 2019-09-20 MED ORDER — RIVAROXABAN 20 MG PO TABS: 20.0000 mg | ORAL_TABLET | Freq: Every day | ORAL | Status: DC

## 2019-09-20 MED ORDER — RIVAROXABAN 15 MG PO TABS
15.0000 mg | ORAL_TABLET | Freq: Two times a day (BID) | ORAL | Status: DC
  Administered 2019-09-20 – 2019-09-21 (×3): 15 mg via ORAL
  Filled 2019-09-20 (×5): qty 1

## 2019-09-20 MED ORDER — HEPARIN BOLUS VIA INFUSION
1500.0000 [IU] | Freq: Once | INTRAVENOUS | Status: AC
  Administered 2019-09-20: 1500 [IU] via INTRAVENOUS
  Filled 2019-09-20: qty 1500

## 2019-09-20 NOTE — Progress Notes (Addendum)
PROGRESS NOTE  Eric [name] [id number] DOB: [birth year] DOA: 09/18/2019 PCP: [name], MD   LOS: 1 day   Brief Narrative / Interim history: 68 year old male with history of non-Hodgkin's lymphoma on chemotherapy, hypertension, obesity, tobacco use, who presents to the hospital with chief complaint of left-sided pleuritic chest pain as well as shortness of breath and low-grade temp.  In the emergency room CT angiogram was positive for PE, he was placed on heparin infusion and was admitted to the hospital.  Chest imaging also showed concern for pneumonia and given fever he was also started on antibiotics  Subjective / 24h Interval events: Feeling better this morning, his pleuritic chest pain has improved significantly.  He is still hypoxic requiring 4 L nasal cannula  Assessment & Plan: Principal Problem Acute hypoxic respiratory failure due to acute pulmonary embolism -Likely in the setting of underlying malignancy, poor mobility due to obesity -Patient started on heparin infusion, continue -Lower extremity Dopplers positive for DVT which is likely the source of his PE -2D echo with mildly reduced RV systolic function, full read below -CT of the chest also shows peripheral consolidation possibly related to pulmonary infarcts versus superimposed infectious process and patient was started on broad-spectrum antibiotics.  Continue Levaquin. -Remains hypoxic on 4 L this morning, will try to wean on room air and see if he tolerates. -Risks/benefits of oral anticoagulation discussed with the patient, discussed about Coumadin/NOACS, requirements for medications, dosing, side effects, bleeding risks antidote availability.  After weighing all the options, patient elects to go on Xarelto given once daily dosing.  Active Problems Non-Hodgkin's lymphoma -Outpatient management  Dilated cardiomyopathy -Repeat echo as below  Tobacco use -will need to quit  Staghorn calculus in the  left renal collecting system -Without obstruction, will need outpatient urology follow-up  Hypertension -Hold Coreg for now, soft blood pressure this morning   Scheduled Meds:  Chlorhexidine Gluconate Cloth  6 each Topical Daily   rivaroxaban  15 mg Oral BID WC   Followed by   Derrill Memo ON 10/11/2019] rivaroxaban  20 mg Oral Daily   Continuous Infusions:  levofloxacin (LEVAQUIN) IV 750 mg (09/19/19 1614)   metronidazole 500 mg (09/20/19 0535)   PRN Meds:.acetaminophen **OR** acetaminophen, HYDROcodone-acetaminophen, ondansetron **OR** ondansetron (ZOFRAN) IV  DVT prophylaxis: heparin infusion Code Status: Full code Family Communication: Wife present at bedside Disposition Plan: Change admission from stepdown to telemetry  Consultants:  None  Procedures:  2D echo:  IMPRESSIONS   1. Left ventricular ejection fraction, by visual estimation, is 55 to 60%. The left ventricle has normal function. There is mildly increased left ventricular hypertrophy.  2. Left ventricular diastolic parameters are consistent with Grade I diastolic dysfunction (impaired relaxation).  3. Global right ventricle has mildly reduced systolic function.The right ventricular size is moderately enlarged. No increase in right ventricular wall thickness.  4. Left atrial size was mildly dilated.  5. Right atrial size was normal.  6. The mitral valve is normal in structure. No evidence of mitral valve regurgitation. No evidence of mitral stenosis.  7. The tricuspid valve is normal in structure. Tricuspid valve regurgitation is not demonstrated.  8. The aortic valve is normal in structure. Aortic valve regurgitation is not visualized. No evidence of aortic valve sclerosis or stenosis.  9. The pulmonic valve was normal in structure. Pulmonic valve regurgitation is not visualized. 10. The inferior vena cava is normal in size with greater than 50% respiratory variability, suggesting right atrial pressure of 3  mmHg.  LE  venous doppler US IMPRESSION: 1. Nonocclusive thrombus in the right popliteal vein, right posterior tibial vein and right peroneal vein. 2. No evidence of left lower extremity deep venous thrombosis.  Microbiology  SARS-CoV-2 -11/17 Blood cultures 11/17-no growth to date Urine culture 11/17-less than 10,000 colonies, insignificant growth Respiratory panel-pending  Antimicrobials: Vancomycin/cefepime 11/17--11/18 Levaquin 11/18 --   Objective: Vitals:   09/19/19 1824 09/19/19 1954 09/20/19 0424 09/20/19 0738  BP: 119/67 103/70 103/62 (!) 99/58  Pulse: 81 85 100 95  Resp: (!) 24 18 16    Temp: 98.5 F (36.9 C) 98.9 F (37.2 C) 99.8 F (37.7 C) 99.1 F (37.3 C)  TempSrc: Oral Oral Oral Oral  SpO2: 99% 100% 95% 96%  Weight: 124.1 kg     Height: 6' (1.829 m)       Intake/Output Summary (Last 24 hours) at 09/20/2019 1103 Last data filed at 09/20/2019 0424 Gross per 24 hour  Intake 1163.56 ml  Output 400 ml  Net 763.56 ml   Filed Weights   09/18/19 1725 09/19/19 1824  Weight: 123.4 kg 124.1 kg    Examination:  Constitutional: No distress Eyes: No scleral icterus ENMT: Moist mucous membranes Neck: normal, supple Respiratory: Diminished at the bases, no wheezing, no crackles Cardiovascular: Regular rate and rhythm, no murmurs, 2+ edema on the right, 1+ on the left Abdomen: Bowel sounds positive Musculoskeletal: no clubbing / cyanosis.  Skin: No rashes appreciated Neurologic: No focal deficits   Data Reviewed: I have independently reviewed following labs and imaging studies   CBC: Recent Labs  Lab 09/18/19 1458 09/19/19 0352 09/20/19 0201  WBC 14.0* 13.4* 11.0*  NEUTROABS 10.4*  --   --   HGB 10.1* 8.8* 8.5*  HCT 30.7* 27.8* 27.0*  MCV 83.9 86.9 85.7  PLT 244 216 [id number]   Basic Metabolic Panel: Recent Labs  Lab 09/18/19 1458 09/19/19 0352 09/20/19 0201  NA 136 138 138  K 4.0 3.9 3.8  CL 105 105 108  CO2 23 21* 22  GLUCOSE 120* 101*  105*  BUN 20 18 17   CREATININE 0.94 0.91 0.81  CALCIUM 8.8* 8.2* 8.3*  MG  --  2.0  --   PHOS  --  3.4  --    Liver Function Tests: Recent Labs  Lab 09/18/19 1458 09/20/19 0201  AST 29 23  ALT 32 29  ALKPHOS 71 65  BILITOT 1.3* 0.9  PROT 6.6 5.7*  ALBUMIN 3.4* 2.8*   Coagulation Profile: Recent Labs  Lab 09/18/19 1458  INR 1.2   HbA1C: No results for input(s): HGBA1C in the last 72 hours. CBG: No results for input(s): GLUCAP in the last 168 hours.  Recent Results (from the past 240 hour(s))  Blood Culture (routine x 2)     Status: None (Preliminary result)   Collection Time: 09/18/19  2:58 PM   Specimen: BLOOD  Result Value Ref Range Status   Specimen Description BLOOD PORTA CATH  Final   Special Requests   Final    BOTTLES DRAWN AEROBIC AND ANAEROBIC Blood Culture adequate volume   Culture   Final    NO GROWTH 2 DAYS Performed at [facility], [address] [place], [place] [address]    Report Status PENDING  Incomplete  Blood Culture (routine x 2)     Status: None (Preliminary result)   Collection Time: 09/18/19  3:03 PM   Specimen: BLOOD  Result Value Ref Range Status   Specimen Description BLOOD LEFT ANTECUBITAL  Final  Special Requests   Final    BOTTLES DRAWN AEROBIC AND ANAEROBIC Blood Culture adequate volume   Culture   Final    NO GROWTH 2 DAYS Performed at [facility], [address] [place], [place] [address]    Report Status PENDING  Incomplete  SARS CORONAVIRUS 2 (TAT 6-24 HRS) Nasopharyngeal Nasopharyngeal Swab     Status: None   Collection Time: 09/18/19  3:07 PM   Specimen: Nasopharyngeal Swab  Result Value Ref Range Status   SARS Coronavirus 2 NEGATIVE NEGATIVE Final    Comment: (NOTE) SARS-CoV-2 target nucleic acids are NOT DETECTED. The SARS-CoV-2 RNA is generally detectable in upper and lower respiratory specimens during the acute phase of infection. Negative results do not preclude SARS-CoV-2 infection, do  not rule out co-infections with other pathogens, and should not be used as the sole basis for treatment or other patient management decisions. Negative results must be combined with clinical observations, patient history, and epidemiological information. The expected result is Negative. Fact Sheet for Patients: SugarRoll.be Fact Sheet for Healthcare Providers: https://www.woods-mathews.com/ This test is not yet approved or cleared by the [place] FDA and  has been authorized for detection and/or diagnosis of SARS-CoV-2 by FDA under an Emergency Use Authorization (EUA). This EUA will remain  in effect (meaning this test can be used) for the duration of the COVID-19 declaration under Section 56 4(b)(1) of the Act, 21 U.S.C. section 360bbb-3(b)(1), unless the authorization is terminated or revoked sooner. Performed at [facility], [place] [address] [place], [place] [address]   Urine culture     Status: Abnormal   Collection Time: 09/18/19  5:03 PM   Specimen: In/Out Cath Urine  Result Value Ref Range Status   Specimen Description   Final    IN/OUT CATH URINE Performed at [facility], [address] [place], [place] [address]    Special Requests   Final    NONE Performed at [facility], [address] [place], [place] [address]    Culture (A)  Final    <10,000 COLONIES/mL INSIGNIFICANT GROWTH Performed at [facility], [place] [address], [place], [place] [address]    Report Status 09/19/2019 FINAL  Final     Time spent: 35 minutes  Radiology Studies: No results found. Marzetta Board, MD, PhD [facility]  Between 7 am - 7 pm I am available, please contact me via Amion or Securechat  Between 7 pm - 7 am I am not available, please contact night coverage MD/APP via Amion

## 2019-09-20 NOTE — Plan of Care (Signed)
  Problem: Clinical Measurements: Goal: Will remain free from infection Outcome: Progressing Goal: Cardiovascular complication will be avoided Outcome: Progressing   Problem: Safety: Goal: Ability to remain free from injury will improve Outcome: Progressing

## 2019-09-20 NOTE — Progress Notes (Signed)
Eric [name]   DOB:09-May-1951   [id number]    Subjective: Acute PE/lymphoma.  Objective: Patient noted to have improvement of his left chest wall pain.  However not completely resolved.  Shortness of breath improved.  Has been walking in th room.  No bleeding.   Vitals:   09/20/19 0738 09/20/19 1546  BP: (!) 99/58 110/66  Pulse: 95 93  Resp:    Temp: 99.1 F (37.3 C) 99.2 F (37.3 C)  SpO2: 96% 90%     Intake/Output Summary (Last 24 hours) at 09/20/2019 1800 Last data filed at 09/20/2019 1415 Gross per 24 hour  Intake -  Output 825 ml  Net -825 ml    Physical Exam  Constitutional: He is oriented to person, place, and time and well-developed, well-nourished, and in no distress.  HENT:  Head: Normocephalic and atraumatic.  Mouth/Throat: Oropharynx is clear and moist. No oropharyngeal exudate.  Eyes: Pupils are equal, round, and reactive to light.  Neck: Normal range of motion. Neck supple.  Cardiovascular: Normal rate and regular rhythm.  Pulmonary/Chest: Effort normal and breath sounds normal. No respiratory distress. He has no wheezes.  Abdominal: Soft. Bowel sounds are normal. He exhibits no distension and no mass. There is no abdominal tenderness. There is no rebound and no guarding.  Musculoskeletal: Normal range of motion.        General: No tenderness or edema.  Neurological: He is alert and oriented to person, place, and time.  Skin: Skin is warm.  Psychiatric: Affect normal.     Labs:  Lab Results  Component Value Date   WBC 11.0 (H) 09/20/2019   HGB 8.5 (L) 09/20/2019   HCT 27.0 (L) 09/20/2019   MCV 85.7 09/20/2019   PLT 224 09/20/2019   NEUTROABS 10.4 (H) 09/18/2019    Lab Results  Component Value Date   NA 138 09/20/2019   K 3.8 09/20/2019   CL 108 09/20/2019   CO2 22 09/20/2019    Studies:  US Venous Img Lower Bilateral (dvt)  Result Date: 09/19/2019 CLINICAL DATA:  Bilateral pulmonary embolism. EXAM: BILATERAL LOWER EXTREMITY VENOUS  DOPPLER ULTRASOUND TECHNIQUE: Gray-scale sonography with graded compression, as well as color Doppler and duplex ultrasound were performed to evaluate the lower extremity deep venous systems from the level of the common femoral vein and including the common femoral, femoral, profunda femoral, popliteal and calf veins including the posterior tibial, peroneal and gastrocnemius veins when visible. The superficial great saphenous vein was also interrogated. Spectral Doppler was utilized to evaluate flow at rest and with distal augmentation maneuvers in the common femoral, femoral and popliteal veins. COMPARISON:  None. FINDINGS: RIGHT LOWER EXTREMITY Common Femoral Vein: No evidence of thrombus. Normal compressibility, respiratory phasicity and response to augmentation. Saphenofemoral Junction: No evidence of thrombus. Normal compressibility and flow on color Doppler imaging. Profunda Femoral Vein: No evidence of thrombus. Normal compressibility and flow on color Doppler imaging. Femoral Vein: No evidence of thrombus. Normal compressibility, respiratory phasicity and response to augmentation. Popliteal Vein: There is some nonocclusive thrombus in the right popliteal vein. Calf Veins: There is some nonocclusive thrombus in the right posterior tibial and peroneal veins. Superficial Great Saphenous Vein: No evidence of thrombus. Normal compressibility. Venous Reflux:  None. Other Findings: No evidence of superficial thrombophlebitis or abnormal fluid collection. LEFT LOWER EXTREMITY Common Femoral Vein: No evidence of thrombus. Normal compressibility, respiratory phasicity and response to augmentation. Saphenofemoral Junction: No evidence of thrombus. Normal compressibility and flow on color Doppler imaging. Profunda Femoral  Vein: No evidence of thrombus. Normal compressibility and flow on color Doppler imaging. Femoral Vein: No evidence of thrombus. Normal compressibility, respiratory phasicity and response to  augmentation. Popliteal Vein: No evidence of thrombus. Normal compressibility, respiratory phasicity and response to augmentation. Calf Veins: No evidence of thrombus. Normal compressibility and flow on color Doppler imaging. Superficial Great Saphenous Vein: No evidence of thrombus. Normal compressibility. Venous Reflux:  None. Other Findings: No evidence of superficial thrombophlebitis or abnormal fluid collection. IMPRESSION: 1. Nonocclusive thrombus in the right popliteal vein, right posterior tibial vein and right peroneal vein. 2. No evidence of left lower extremity deep venous thrombosis. Electronically Signed   By: Aletta Edouard M.D.   On: 09/19/2019 09:05    Bilateral pulmonary embolism [facility]) # 68 year old male patient with diffuse large B-cell lymphoma currently on chemotherapy is currently admitted to hospital for worsening left-sided chest pain/flank pain-CTA shows bilateral PE/left lower lobe possible infarction  # Diffuse large B-cell lymphoma-ABC subtype-currently 2 cycles of R-CEOP chemotherapy with good clinical response.  Discussed with the patient that we might have to postpone cycle #3 by week-left acute issues resolve [see below]  # Bilateral PE- submassive/right lower extremity nonocclusive DVT.  Currently on IV heparin; clinical improvement noted.  Recommend switching over to oral therapy- like eliquis/xarelto.  Patient will need long-term anticoagulation.   #The above plan of care was discussed with the patient.  She has been in.  Cammie Sickle, MD 09/20/2019  6:00 PM

## 2019-09-20 NOTE — Consult Note (Signed)
[place] for Heparin Indication: pulmonary embolus  Allergies  Allergen Reactions  . Allopurinol Rash  . Azithromycin Itching    Patient Measurements: Height: 6' (182.9 cm) Weight: 273 lb 8 oz (124.1 kg) IBW/kg (Calculated) : 77.6 Heparin Dosing Weight:  Vital Signs: Temp: 98.9 F (37.2 C) (11/18 1954) Temp Source: Oral (11/18 1954) BP: 103/70 (11/18 1954) Pulse Rate: 85 (11/18 1954)  Labs: Recent Labs    09/18/19 1458 09/18/19 1703 09/18/19 2014  09/19/19 0352 09/19/19 1237 09/19/19 1944 09/20/19 0201  HGB 10.1*  --   --   --  8.8*  --   --  8.5*  HCT 30.7*  --   --   --  27.8*  --   --  27.0*  PLT 244  --   --   --  216  --   --  224  APTT 37*  --   --   --   --   --   --   --   LABPROT 15.5*  --   --   --   --   --   --   --   INR 1.2  --   --   --   --   --   --   --   HEPARINUNFRC  --   --   --    < > 0.25* 0.20* 0.43 0.22*  CREATININE 0.94  --   --   --  0.91  --   --  0.81  TROPONINIHS 12 10 10   --  7  --   --   --    < > = values in this interval not displayed.    Estimated Creatinine Clearance: 122.1 mL/min (by C-G formula based on SCr of 0.81 mg/dL).   Medications:  No PTA anticoagulants, per patient   Assessment: Pharmacy has been consulted for heparin dosing in a patient with a PE.    Goal of Therapy:  Heparin level 0.3-0.7 units/ml Monitor platelets by anticoagulation protocol: Yes   Plan:  11/19 @ 0200 HL 0.22 subtherapeutic. Will rebolus heparin 1500 units IV x 1 and increase rate to 2400 units/hr and will recheck HL @ 1100, CBC has been trending down, per RN patient not having any issues bleeding, drip was not held, will continue to monitor.  Tobie Lords, PharmD, BCPS Clinical Pharmacist 09/20/2019,3:15 AM

## 2019-09-20 NOTE — Consult Note (Signed)
[name] for Xarelto Indication: pulmonary embolus  Allergies  Allergen Reactions  . Allopurinol Rash  . Azithromycin Itching    Patient Measurements: Height: 6' (182.9 cm) Weight: 273 lb 8 oz (124.1 kg) IBW/kg (Calculated) : 77.6  Vital Signs: Temp: 99.1 F (37.3 C) (11/19 0738) Temp Source: Oral (11/19 0738) BP: 99/58 (11/19 0738) Pulse Rate: 95 (11/19 0738)  Labs: Recent Labs    09/18/19 1458 09/18/19 1703 09/18/19 2014  09/19/19 0352 09/19/19 1237 09/19/19 1944 09/20/19 0201  HGB 10.1*  --   --   --  8.8*  --   --  8.5*  HCT 30.7*  --   --   --  27.8*  --   --  27.0*  PLT 244  --   --   --  216  --   --  224  APTT 37*  --   --   --   --   --   --   --   LABPROT 15.5*  --   --   --   --   --   --   --   INR 1.2  --   --   --   --   --   --   --   HEPARINUNFRC  --   --   --    < > 0.25* 0.20* 0.43 0.22*  CREATININE 0.94  --   --   --  0.91  --   --  0.81  TROPONINIHS 12 10 10   --  7  --   --   --    < > = values in this interval not displayed.    Estimated Creatinine Clearance: 122.1 mL/min (by C-G formula based on SCr of 0.81 mg/dL).   Medications:  No PTA anticoagulants, per patient   Assessment: Pharmacy has been consulted for Xarelto dosing in a patient with a PE.    Goal of Therapy:  Monitor platelets by anticoagulation protocol: Yes   Plan:  Will initiate Xarelto 15mg  bid x 21 days, followed by Xarelto 20mg  daily.  Heparin drip will be stopped with first dose of Xarelto.  Pharmacy to counsel patient regarding new anticoagulant medication.  Will check CBC/Scr a minimum of every 3 days per protocol.  Lu Duffel, PharmD, BCPS Clinical Pharmacist 09/20/2019 9:26 AM

## 2019-09-20 NOTE — Progress Notes (Signed)
Pt o2 weaned down to 1 liter via [place]. desat to 88% on RA at rest. 95% on 1liter.

## 2019-09-20 NOTE — Progress Notes (Signed)
PT Cancellation Note  Patient Details Name: Eric [name] MRN: [id number] DOB: 1951/07/17   Cancelled Treatment:    Reason Eval/Treat Not Completed: Medical issues which prohibited therapy.  Chart reviewed.  Imaging showing acute PE; therapeutic dose of anticoagulation initiated on 09/18/19 at 1740.  Per PT protocol, will hold PT eval for 48 hours after therapeutic dose of anticoagulation initiated and pt is medically appropriate for physical therapy evaluation.  Leitha Bleak, PT 09/20/19, 1:29 PM [phone]

## 2019-09-21 ENCOUNTER — Telehealth: Payer: Self-pay | Admitting: Internal Medicine

## 2019-09-21 DIAGNOSIS — J9601 Acute respiratory failure with hypoxia: Secondary | ICD-10-CM

## 2019-09-21 DIAGNOSIS — C8590 Non-Hodgkin lymphoma, unspecified, unspecified site: Secondary | ICD-10-CM

## 2019-09-21 DIAGNOSIS — Z7901 Long term (current) use of anticoagulants: Secondary | ICD-10-CM

## 2019-09-21 LAB — BASIC METABOLIC PANEL
Anion gap: 7 (ref 5–15)
BUN: 13 mg/dL (ref 8–23)
CO2: 23 mmol/L (ref 22–32)
Calcium: 8.5 mg/dL — ABNORMAL LOW (ref 8.9–10.3)
Chloride: 110 mmol/L (ref 98–111)
Creatinine, Ser: 0.83 mg/dL (ref 0.61–1.24)
GFR calc Af Amer: >60 mL/min (ref >60)
GFR calc non Af Amer: >60 mL/min (ref >60)
Glucose, Bld: 101 mg/dL — ABNORMAL HIGH (ref 70–99)
Potassium: 3.6 mmol/L (ref 3.5–5.1)
Sodium: 140 mmol/L (ref 135–145)

## 2019-09-21 LAB — CBC
HCT: 27 % — ABNORMAL LOW (ref 39.0–52.0)
Hemoglobin: 8.8 g/dL — ABNORMAL LOW (ref 13.0–17.0)
MCH: 27.6 pg (ref 26.0–34.0)
MCHC: 32.6 g/dL (ref 30.0–36.0)
MCV: 84.6 fL (ref 80.0–100.0)
Platelets: 255 10*3/uL (ref 150–400)
RBC: 3.19 MIL/uL — ABNORMAL LOW (ref 4.22–5.81)
RDW: 18.2 % — ABNORMAL HIGH (ref 11.5–15.5)
WBC: 9.4 10*3/uL (ref 4.0–10.5)
nRBC: 0 % (ref 0.0–0.2)

## 2019-09-21 LAB — PROCALCITONIN: Procalcitonin: 0.1 ng/mL

## 2019-09-21 MED ORDER — HEPARIN SOD (PORK) LOCK FLUSH 10 UNIT/ML IV SOLN
10.0000 [IU] | Freq: Once | INTRAVENOUS | Status: AC
  Administered 2019-09-21: 10 [IU]
  Filled 2019-09-21: qty 1

## 2019-09-21 MED ORDER — SODIUM CHLORIDE 0.9% FLUSH: 10.0000 mL | INTRAVENOUS | Status: DC | PRN

## 2019-09-21 MED ORDER — RIVAROXABAN (XARELTO) VTE STARTER PACK (15 & 20 MG): ORAL_TABLET | ORAL | 0 refills | Status: DC

## 2019-09-21 MED ORDER — SODIUM CHLORIDE 0.9% FLUSH
10.0000 mL | Freq: Two times a day (BID) | INTRAVENOUS | Status: DC
  Administered 2019-09-21: 10 mL

## 2019-09-21 NOTE — Progress Notes (Signed)
Eric [name]   DOB:[birth year]   [id number]    Subjective: Patient left chest wall pain significantly improved.  Resting on room air oxygen levels 92-94.  Is currently on Xarelto started last night.  Objective:  Vitals:   09/21/19 0748 09/21/19 0900  BP: 117/71   Pulse: 80   Resp: 19   Temp: 97.7 F (36.5 C)   SpO2: 92% (!) 89%     Intake/Output Summary (Last 24 hours) at 09/21/2019 1521 Last data filed at 09/21/2019 1429 Gross per 24 hour  Intake 20 ml  Output 200 ml  Net -180 ml    Physical Exam  Constitutional: He is oriented to person, place, and time and well-developed, well-nourished, and in no distress.  HENT:  Head: Normocephalic and atraumatic.  Mouth/Throat: Oropharynx is clear and moist. No oropharyngeal exudate.  Eyes: Pupils are equal, round, and reactive to light.  Neck: Normal range of motion. Neck supple.  Cardiovascular: Normal rate and regular rhythm.  Pulmonary/Chest: Effort normal and breath sounds normal. No respiratory distress. He has no wheezes.  Abdominal: Soft. Bowel sounds are normal. He exhibits no distension and no mass. There is no abdominal tenderness. There is no rebound and no guarding.  Musculoskeletal: Normal range of motion.        General: No tenderness or edema.  Neurological: He is alert and oriented to person, place, and time.  Skin: Skin is warm.  Psychiatric: Affect normal.     Labs:  Lab Results  Component Value Date   WBC 9.4 09/21/2019   HGB 8.8 (L) 09/21/2019   HCT 27.0 (L) 09/21/2019   MCV 84.6 09/21/2019   PLT 255 09/21/2019   NEUTROABS 10.4 (H) 09/18/2019    Lab Results  Component Value Date   NA 140 09/21/2019   K 3.6 09/21/2019   CL 110 09/21/2019   CO2 23 09/21/2019    Studies:  No results found.  Bilateral pulmonary embolism [facility]) # 68 year old male patient with diffuse large B-cell lymphoma currently on chemotherapy is currently admitted to hospital for worsening left-sided chest pain/flank  pain-CTA shows bilateral PE/left lower lobe possible infarction  # Diffuse large B-cell lymphoma-ABC subtype-currently 2 cycles of R-CEOP chemotherapy with good clinical response.  We will postpone cycle number 3 x 1 week; given the acute issues/see below.  # Bilateral PE- submassive/right lower extremity nonocclusive DVT.  Currently switched over from IV heparin to Xarelto.  Clinical improvement noted.  Continue Xarelto.  #The above plan of care was discussed with the patient.  Also discussed with the hospitalist service; patient could be discharged home today.  He will follow up with me in 10 days.     Cammie Sickle, MD 09/21/2019  3:21 PM

## 2019-09-21 NOTE — Discharge Summary (Addendum)
Discharge Summary  Eric [name] [id number] DOB: 22-Jan-1951  PCP: [name], MD  Admit date: 09/18/2019 Discharge date: 09/21/2019  Time spent: 25 minutes  Recommendations for Outpatient Follow-up:  1. New medication: Xarelto 15 mg twice a day until 12/10.  Then change to 20 mg once a day.  This medication will likely continue long-term 2. Home oxygen 2 L continuous.  It is likely that this medication can be weaned off in the next 1 month..  Patient will follow-up with his PCP 3. Patient will follow up with his oncologist in approximately 10 days  Discharge Diagnoses:  Active Hospital Problems   Diagnosis Date Noted   PE (pulmonary thromboembolism) ([place]) 09/19/2019   Tobacco abuse 09/18/2019   Bilateral pulmonary embolism ([place]) 09/18/2019   Sepsis ([place]) 09/18/2019   Dilated cardiomyopathy ([place]) 07/29/2019   Lymphoma of lymph nodes ([place]) 07/03/2019   Morbid obesity ([place]) 03/25/2016    Resolved Hospital Problems  No resolved problems to display.    Discharge Condition: Improved, being discharged home  Diet recommendation: Heart healthy  Vitals:   09/21/19 0748 09/21/19 0900  BP: 117/71   Pulse: 80   Resp: 19   Temp: 97.7 F (36.5 C)   SpO2: 92% (!) 89%    History of present illness:  Patient is a 68 year old male with past medical history of non-Hodgkin's lymphoma currently on chemotherapy, hypertension, tobacco abuse and morbid obesity who presented to the emergency room on 11/17 with severe sharp left-sided pleuritic chest pain as well as shortness of breath which it started a day prior.  In the emergency room, patient found to be hypoxic requiring 4 L nasal cannula and a CT scan was positive for bilateral pulmonary embolus.  There was a question of possible pneumonia and patient started on antibiotics.  Admitted to the hospitalist service.  Hospital Course:  Active Problems:   Morbid obesity [facility]): Patient meets criteria with BMI greater than 35  as well as history of hypertension, non-Hodgkin's lymphoma and cardiomyopathy  Non-Hodgkin's lymphoma: Patient will follow-up with oncology in the next 10 days.  Malignancy likely underlying cause of this PE.    Tobacco abuse: Counseled to quit.  Acute respiratory failure with hypoxia secondary to bilateral pulmonary embolism [facility]): Cause is felt to be secondary to his underlying malignancy as well as some poor mobility from obesity.  Patient underwent a 2D echo which showed a mildly reduced right ventricular systolic function and lower extremity Dopplers positive for DVT.  Initially placed on heparin and this was changed over to Xarelto which she tolerated well.    Patient initially on 4 L nasal cannula.  Over the next few days this was attempted to be weaned down.  By day of discharge, patient was feeling comfortable on 2 L and tried to wean down to no oxygen.  He was seen by physical therapy and noted to have an oxygen saturation of 89% on room air resting, walking, his oxygen saturations dropped to 85%.  It was felt that he would need short-term oxygen, 2 L continuously but likely will be able to wean off in the next month.  Based off of questionable infiltrate seen on CT, there was a question of whether patient also had pneumonia.  He was initially placed on antibiotics.  He had a low-grade fever, now felt to be attributed to just pulmonary embolus.  Procalcitonin level was minimally elevated and by day of discharge, 2 days later, normal.  Patient does not need antibiotics on  discharge  History of dilated cardiomyopathy: Repeat echo as below.  Stable.  Essential hypertension: Coreg held during initial hospitalization given soft blood pressure, he will resume this on discharge.   Procedures:  2D echo: Preserved ejection fraction.  Grade 1 diastolic dysfunction.  No valvular dysfunction.   Lower extremity venous Dopplers.  Nonocclusive thrombus in right popliteal vein as well as right  posterior tibial vein and right peroneal vein.  No evidence of thrombus in the left leg.  Consultations:  None  Discharge Exam: BP 117/71 (BP Location: Left Arm)    Pulse 80    Temp 97.7 F (36.5 C) (Oral)    Resp 19    Ht 6' (1.829 m)    Wt 123.7 kg    SpO2 (!) 89%    BMI 36.97 kg/m   General: Alert and oriented x3, no acute distress Cardiovascular: Regular rate and rhythm, S1-S2 Respiratory: Clear to auscultation bilaterally  Discharge Instructions You were cared for by a hospitalist during your hospital stay. If you have any questions about your discharge medications or the care you received while you were in the hospital after you are discharged, you can call the unit and asked to speak with the hospitalist on call if the hospitalist that took care of you is not available. Once you are discharged, your primary care physician will handle any further medical issues. Please note that NO REFILLS for any discharge medications will be authorized once you are discharged, as it is imperative that you return to your primary care physician (or establish a relationship with a primary care physician if you do not have one) for your aftercare needs so that they can reassess your need for medications and monitor your lab values.  Discharge Instructions    Diet - low sodium heart healthy   Complete by: As directed    Increase activity slowly   Complete by: As directed      Allergies as of 09/21/2019      Reactions   Allopurinol Rash   Azithromycin Itching      Medication List    TAKE these medications   carvedilol 6.25 MG tablet Commonly known as: COREG Take 1 tablet (6.25 mg total) by mouth 2 (two) times daily.   lidocaine-prilocaine cream Commonly known as: EMLA Apply generously on the port 30-45 prior to accessing the port.   MULTI-VITAMIN/MINERALS PO Take 1 capsule by mouth.   Rivaroxaban 15 & 20 MG Tbpk Follow package directions: Take one 15mg  tablet by mouth twice a day. On  day 20 (10/11/19), switch to one 20mg  tablet once a day. Take with food.            Durable Medical Equipment  (From admission, onward)         Start     Ordered   09/21/19 0957  For home use only DME oxygen  Once    Question Answer Comment  Length of Need 6 Months   Liters per Minute 2   Frequency Continuous (stationary and portable oxygen unit needed)   Oxygen delivery system Gas      09/21/19 0956         Allergies  Allergen Reactions   Allopurinol Rash   Azithromycin Itching   Follow-up Information    [name], MD. Go in 1 month.   Specialty: Family Medicine Contact information: [address] [place] [address] [phone]        Cammie Sickle, MD. Go in  10 days.   Specialties: Internal Medicine, Oncology Why: Office will call you to make appt Contact information: [place] [address] [phone]            The results of significant diagnostics from this hospitalization (including imaging, microbiology, ancillary and laboratory) are listed below for reference.    Significant Diagnostic Studies: Ct Angio Chest Pe W And/or Wo Contrast  Result Date: 09/18/2019 CLINICAL DATA:  Severe pain, left axilla worsened with deep inspiration, PE suspected, high pretest probability additionally, acute generalized abdominal pain with fever worse in the left upper quadrant and left flank and lumbar ways CT EXAM: CT ANGIOGRAPHY CHEST CT ABDOMEN AND PELVIS WITH CONTRAST TECHNIQUE: Multidetector CT imaging of the chest was performed using the standard protocol during bolus administration of intravenous contrast. Multiplanar CT image reconstructions and MIPs were obtained to evaluate the vascular anatomy. Multidetector CT imaging of the abdomen and pelvis was performed using the standard protocol during bolus administration of intravenous contrast. CONTRAST:  34mL OMNIPAQUE IOHEXOL 350 MG/ML SOLN COMPARISON:  CT  abdomen pelvis 02/24/2014 FINDINGS: CTA CHEST FINDINGS Cardiovascular: Suboptimal opacification of pulmonary arteries (main pulmonary artery bolus measuring 175 HU). Such finding may limit evaluation of the segmental and subsegmental pulmonary arteries. However, there is extensive pulmonary arterial filling defect seen throughout the lobar and proximal segmental pulmonary arteries of all 5 lobes mild central pulmonary artery enlargement is noted as well as elevation of the RV/LV ratio (1.06). Mild cardiomegaly. No pericardial effusion. Few coronary artery calcifications are present. Normal caliber thoracic aorta with scant atherosclerotic plaque. Normal 3 vessel branching of the arch with minimal calcification in the proximal great vessels. Tunneled right IJ Port-A-Cath tip terminates at the superior cavoatrial junction with port access at this time. Small pericaval lipoma. Major venous structures are otherwise unremarkable. Mediastinum/Nodes: Scattered low-attenuation, subcentimeter mediastinal hilar adenopathy. No pathologically enlarged mediastinal, hilar or axillary nodes. No acute abnormality of the trachea or esophagus. Thyroid gland and thoracic inlet are unremarkable. Lungs/Pleura: There are peripheral areas wedge-shaped opacity which could reflect sequela of wedge pulmonary infarcts versus a superimposed infectious process given more diffuse hazy ground-glass opacity throughout the lungs. Mild airways thickening is noted. Trace left effusion is seen. No pneumothorax. Bibasilar areas of atelectasis. Musculoskeletal: Multilevel degenerative changes are present in the imaged portions of the spine. Mild degenerative changes in the shoulders. No acute osseous abnormality or suspicious osseous lesion. No concerning chest wall lesions. Review of the MIP images confirms the above findings. CT ABDOMEN and PELVIS FINDINGS Hepatobiliary: Stable dystrophic capsular calcifications along the posterior right lobe liver,  unchanged from comparison. No focal liver abnormality is seen. Patient is post cholecystectomy. Slight prominence of the biliary tree likely related to reservoir effect. No calcified intraductal gallstones. Pancreas: Unremarkable. No pancreatic ductal dilatation or surrounding inflammatory changes. Spleen: Splenomegaly. No concerning focal splenic lesions. Adrenals/Urinary Tract: Mild bilateral symmetric perinephric stranding, a nonspecific finding though may correlate with either age or decreased renal function. Findings unchanged from prior. Fluid attenuation cyst in the interpolar left kidney measuring 3.4 cm, increased in size from comparison but without concerning features. Multiple coarse calcifications are again seen in the left renal collecting system including a small 16 mm developing staghorn calculus seen in the lower renal pelvis with obstruction of the lower pole calices. No other obstructive uropathy or ureteral calculi. Normal urinary bladder. Stomach/Bowel: Distal esophagus, stomach and duodenal sweep are unremarkable. No small bowel wall thickening or dilatation. No evidence of obstruction. A normal  appendix is visualized. No colonic dilatation or wall thickening. Scattered colonic diverticula without focal pericolonic inflammation to suggest diverticulitis. Vascular/Lymphatic: Atherosclerotic plaque within the normal caliber aorta. No suspicious or enlarged lymph nodes in the included lymphatic chains. Few calcified lymph nodes noted in the abdomen, similar comparison study. Reproductive: Coarse eccentric calcification of the prostate. No concerning abnormalities of the prostate or seminal vesicles. Other: No abdominopelvic free fluid or free gas. No bowel containing hernias. Bilateral fat containing inguinal hernias. Musculoskeletal: Multilevel degenerative changes are present in the imaged portions of the spine. 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 No acute osseous  abnormality or suspicious osseous lesion. Additional mild degenerative changes in the hips. Review of the MIP images confirms the above findings. IMPRESSION: 1. Suboptimal opacification of the pulmonary arteries (main pulmonary artery bolus measuring 175 HU). However, exam is positive for acute pulmonary emboli throughout the lobar and proximal segmental pulmonary arteries 5 lobes of the lungs with CT evidence of right heart strain (RV/LV Ratio = 1.06) consistent with at least submassive (intermediate risk) PE. The presence of right heart strain has been associated with an increased risk of morbidity and mortality. Please activate Code PE by paging [phone]. 2. Peripheral predominant areas of consolidation some of which have a wedge-shaped appearance which could reflect sequela of wedge pulmonary infarcts versus a superimposed infectious process given a background more diffuse hazy ground-glass opacity throughout the lungs. Trace left effusion. 3. Multiple coarse calcifications in the left renal collecting system including a 16 mm developing staghorn calculus in the lower pole calices with obstruction of the lower pole calices and layering milk of calcium. No other obstructive uropathy or ureteral calculi. 4. No other acute abnormality in the abdomen or pelvis. 5. Diverticulosis without evidence of acute diverticulitis. 6. Aortic Atherosclerosis (ICD10-I70.0). These results were called by telephone at the time of interpretation on 09/18/2019 at 5:24 pm to provider Merlyn Lot , who verbally acknowledged these results. Electronically Signed   By: Lovena Le M.D.   On: 09/18/2019 17:24   Ct Abdomen Pelvis W Contrast  Result Date: 09/18/2019 CLINICAL DATA:  Severe pain, left axilla worsened with deep inspiration, PE suspected, high pretest probability additionally, acute generalized abdominal pain with fever worse in the left upper quadrant and left flank and lumbar ways CT EXAM: CT ANGIOGRAPHY CHEST CT  ABDOMEN AND PELVIS WITH CONTRAST TECHNIQUE: Multidetector CT imaging of the chest was performed using the standard protocol during bolus administration of intravenous contrast. Multiplanar CT image reconstructions and MIPs were obtained to evaluate the vascular anatomy. Multidetector CT imaging of the abdomen and pelvis was performed using the standard protocol during bolus administration of intravenous contrast. CONTRAST:  70mL OMNIPAQUE IOHEXOL 350 MG/ML SOLN COMPARISON:  CT abdomen pelvis 02/24/2014 FINDINGS: CTA CHEST FINDINGS Cardiovascular: Suboptimal opacification of pulmonary arteries (main pulmonary artery bolus measuring 175 HU). Such finding may limit evaluation of the segmental and subsegmental pulmonary arteries. However, there is extensive pulmonary arterial filling defect seen throughout the lobar and proximal segmental pulmonary arteries of all 5 lobes mild central pulmonary artery enlargement is noted as well as elevation of the RV/LV ratio (1.06). Mild cardiomegaly. No pericardial effusion. Few coronary artery calcifications are present. Normal caliber thoracic aorta with scant atherosclerotic plaque. Normal 3 vessel branching of the arch with minimal calcification in the proximal great vessels. Tunneled right IJ Port-A-Cath tip terminates at the superior cavoatrial junction with port access at this time. Small pericaval lipoma. Major venous structures are otherwise unremarkable. Mediastinum/Nodes: Scattered  low-attenuation, subcentimeter mediastinal hilar adenopathy. No pathologically enlarged mediastinal, hilar or axillary nodes. No acute abnormality of the trachea or esophagus. Thyroid gland and thoracic inlet are unremarkable. Lungs/Pleura: There are peripheral areas wedge-shaped opacity which could reflect sequela of wedge pulmonary infarcts versus a superimposed infectious process given more diffuse hazy ground-glass opacity throughout the lungs. Mild airways thickening is noted. Trace left  effusion is seen. No pneumothorax. Bibasilar areas of atelectasis. Musculoskeletal: Multilevel degenerative changes are present in the imaged portions of the spine. Mild degenerative changes in the shoulders. No acute osseous abnormality or suspicious osseous lesion. No concerning chest wall lesions. Review of the MIP images confirms the above findings. CT ABDOMEN and PELVIS FINDINGS Hepatobiliary: Stable dystrophic capsular calcifications along the posterior right lobe liver, unchanged from comparison. No focal liver abnormality is seen. Patient is post cholecystectomy. Slight prominence of the biliary tree likely related to reservoir effect. No calcified intraductal gallstones. Pancreas: Unremarkable. No pancreatic ductal dilatation or surrounding inflammatory changes. Spleen: Splenomegaly. No concerning focal splenic lesions. Adrenals/Urinary Tract: Mild bilateral symmetric perinephric stranding, a nonspecific finding though may correlate with either age or decreased renal function. Findings unchanged from prior. Fluid attenuation cyst in the interpolar left kidney measuring 3.4 cm, increased in size from comparison but without concerning features. Multiple coarse calcifications are again seen in the left renal collecting system including a small 16 mm developing staghorn calculus seen in the lower renal pelvis with obstruction of the lower pole calices. No other obstructive uropathy or ureteral calculi. Normal urinary bladder. Stomach/Bowel: Distal esophagus, stomach and duodenal sweep are unremarkable. No small bowel wall thickening or dilatation. No evidence of obstruction. A normal appendix is visualized. No colonic dilatation or wall thickening. Scattered colonic diverticula without focal pericolonic inflammation to suggest diverticulitis. Vascular/Lymphatic: Atherosclerotic plaque within the normal caliber aorta. No suspicious or enlarged lymph nodes in the included lymphatic chains. Few calcified lymph  nodes noted in the abdomen, similar comparison study. Reproductive: Coarse eccentric calcification of the prostate. No concerning abnormalities of the prostate or seminal vesicles. Other: No abdominopelvic free fluid or free gas. No bowel containing hernias. Bilateral fat containing inguinal hernias. Musculoskeletal: Multilevel degenerative changes are present in the imaged portions of the spine. 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 No acute osseous abnormality or suspicious osseous lesion. Additional mild degenerative changes in the hips. Review of the MIP images confirms the above findings. IMPRESSION: 1. Suboptimal opacification of the pulmonary arteries (main pulmonary artery bolus measuring 175 HU). However, exam is positive for acute pulmonary emboli throughout the lobar and proximal segmental pulmonary arteries 5 lobes of the lungs with CT evidence of right heart strain (RV/LV Ratio = 1.06) consistent with at least submassive (intermediate risk) PE. The presence of right heart strain has been associated with an increased risk of morbidity and mortality. Please activate Code PE by paging [phone]. 2. Peripheral predominant areas of consolidation some of which have a wedge-shaped appearance which could reflect sequela of wedge pulmonary infarcts versus a superimposed infectious process given a background more diffuse hazy ground-glass opacity throughout the lungs. Trace left effusion. 3. Multiple coarse calcifications in the left renal collecting system including a 16 mm developing staghorn calculus in the lower pole calices with obstruction of the lower pole calices and layering milk of calcium. No other obstructive uropathy or ureteral calculi. 4. No other acute abnormality in the abdomen or pelvis. 5. Diverticulosis without evidence of acute diverticulitis. 6. Aortic Atherosclerosis (ICD10-I70.0). These results were called by telephone at  the time of interpretation on 09/18/2019  at 5:24 pm to provider Merlyn Lot , who verbally acknowledged these results. Electronically Signed   By: Lovena Le M.D.   On: 09/18/2019 17:24   US Venous Img Lower Bilateral (dvt)  Result Date: 09/19/2019 CLINICAL DATA:  Bilateral pulmonary embolism. EXAM: BILATERAL LOWER EXTREMITY VENOUS DOPPLER ULTRASOUND TECHNIQUE: Gray-scale sonography with graded compression, as well as color Doppler and duplex ultrasound were performed to evaluate the lower extremity deep venous systems from the level of the common femoral vein and including the common femoral, femoral, profunda femoral, popliteal and calf veins including the posterior tibial, peroneal and gastrocnemius veins when visible. The superficial great saphenous vein was also interrogated. Spectral Doppler was utilized to evaluate flow at rest and with distal augmentation maneuvers in the common femoral, femoral and popliteal veins. COMPARISON:  None. FINDINGS: RIGHT LOWER EXTREMITY Common Femoral Vein: No evidence of thrombus. Normal compressibility, respiratory phasicity and response to augmentation. Saphenofemoral Junction: No evidence of thrombus. Normal compressibility and flow on color Doppler imaging. Profunda Femoral Vein: No evidence of thrombus. Normal compressibility and flow on color Doppler imaging. Femoral Vein: No evidence of thrombus. Normal compressibility, respiratory phasicity and response to augmentation. Popliteal Vein: There is some nonocclusive thrombus in the right popliteal vein. Calf Veins: There is some nonocclusive thrombus in the right posterior tibial and peroneal veins. Superficial Great Saphenous Vein: No evidence of thrombus. Normal compressibility. Venous Reflux:  None. Other Findings: No evidence of superficial thrombophlebitis or abnormal fluid collection. LEFT LOWER EXTREMITY Common Femoral Vein: No evidence of thrombus. Normal compressibility, respiratory phasicity and response to augmentation. Saphenofemoral  Junction: No evidence of thrombus. Normal compressibility and flow on color Doppler imaging. Profunda Femoral Vein: No evidence of thrombus. Normal compressibility and flow on color Doppler imaging. Femoral Vein: No evidence of thrombus. Normal compressibility, respiratory phasicity and response to augmentation. Popliteal Vein: No evidence of thrombus. Normal compressibility, respiratory phasicity and response to augmentation. Calf Veins: No evidence of thrombus. Normal compressibility and flow on color Doppler imaging. Superficial Great Saphenous Vein: No evidence of thrombus. Normal compressibility. Venous Reflux:  None. Other Findings: No evidence of superficial thrombophlebitis or abnormal fluid collection. IMPRESSION: 1. Nonocclusive thrombus in the right popliteal vein, right posterior tibial vein and right peroneal vein. 2. No evidence of left lower extremity deep venous thrombosis. Electronically Signed   By: Aletta Edouard M.D.   On: 09/19/2019 09:05   Dg Chest Port 1 View  Result Date: 09/18/2019 CLINICAL DATA:  Shortness of breath EXAM: PORTABLE CHEST 1 VIEW COMPARISON:  None. FINDINGS: There is mild cardiomegaly. There is mild prominence to the central pulmonary vasculature. There is blunting of the bilateral costophrenic angles which could be small bilateral pleural effusions, right greater than left. A right-sided MediPort catheter seen with the tip in the mid SVC. No acute osseous abnormality. IMPRESSION: Mild cardiomegaly and central pulmonary vascular congestion. Small bilateral pleural effusions, right greater than left. Electronically Signed   By: Prudencio Pair M.D.   On: 09/18/2019 15:17    Microbiology: Recent Results (from the past 240 hour(s))  Blood Culture (routine x 2)     Status: None (Preliminary result)   Collection Time: 09/18/19  2:58 PM   Specimen: BLOOD  Result Value Ref Range Status   Specimen Description BLOOD PORTA CATH  Final   Special Requests   Final    BOTTLES  DRAWN AEROBIC AND ANAEROBIC Blood Culture adequate volume   Culture   Final  NO GROWTH 3 DAYS Performed at [facility], [address] [place], [place] [address]    Report Status PENDING  Incomplete  Blood Culture (routine x 2)     Status: None (Preliminary result)   Collection Time: 09/18/19  3:03 PM   Specimen: BLOOD  Result Value Ref Range Status   Specimen Description BLOOD LEFT ANTECUBITAL  Final   Special Requests   Final    BOTTLES DRAWN AEROBIC AND ANAEROBIC Blood Culture adequate volume   Culture   Final    NO GROWTH 3 DAYS Performed at [facility], [address] [place], [place] [address]    Report Status PENDING  Incomplete  SARS CORONAVIRUS 2 (TAT 6-24 HRS) Nasopharyngeal Nasopharyngeal Swab     Status: None   Collection Time: 09/18/19  3:07 PM   Specimen: Nasopharyngeal Swab  Result Value Ref Range Status   SARS Coronavirus 2 NEGATIVE NEGATIVE Final    Comment: (NOTE) SARS-CoV-2 target nucleic acids are NOT DETECTED. The SARS-CoV-2 RNA is generally detectable in upper and lower respiratory specimens during the acute phase of infection. Negative results do not preclude SARS-CoV-2 infection, do not rule out co-infections with other pathogens, and should not be used as the sole basis for treatment or other patient management decisions. Negative results must be combined with clinical observations, patient history, and epidemiological information. The expected result is Negative. Fact Sheet for Patients: SugarRoll.be Fact Sheet for Healthcare Providers: https://www.woods-mathews.com/ This test is not yet approved or cleared by the [place] FDA and  has been authorized for detection and/or diagnosis of SARS-CoV-2 by FDA under an Emergency Use Authorization (EUA). This EUA will remain  in effect (meaning this test can be used) for the duration of the COVID-19 declaration under Section 56 4(b)(1) of  the Act, 21 U.S.C. section 360bbb-3(b)(1), unless the authorization is terminated or revoked sooner. Performed at [facility], [place] [address] [place], [place] [address]   Urine culture     Status: Abnormal   Collection Time: 09/18/19  5:03 PM   Specimen: In/Out Cath Urine  Result Value Ref Range Status   Specimen Description   Final    IN/OUT CATH URINE Performed at [facility], [address] [place], [place] [address]    Special Requests   Final    NONE Performed at [facility], [address] [place], [place] [address]    Culture (A)  Final    <10,000 COLONIES/mL INSIGNIFICANT GROWTH Performed at [facility], [place] [address], [place], [place] [address]    Report Status 09/19/2019 FINAL  Final     Labs: Basic Metabolic Panel: Recent Labs  Lab 09/18/19 1458 09/19/19 0352 09/20/19 0201 09/21/19 0455  NA 136 138 138 140  K 4.0 3.9 3.8 3.6  CL 105 105 108 110  CO2 23 21* 22 23  GLUCOSE 120* 101* 105* 101*  BUN 20 18 17 13   CREATININE 0.94 0.91 0.81 0.83  CALCIUM 8.8* 8.2* 8.3* 8.5*  MG  --  2.0  --   --   PHOS  --  3.4  --   --    Liver Function Tests: Recent Labs  Lab 09/18/19 1458 09/20/19 0201  AST 29 23  ALT 32 29  ALKPHOS 71 65  BILITOT 1.3* 0.9  PROT 6.6 5.7*  ALBUMIN 3.4* 2.8*   Recent Labs  Lab 09/18/19 1518  LIPASE 20   No results for input(s): AMMONIA in the last  168 hours. CBC: Recent Labs  Lab 09/18/19 1458 09/19/19 0352 09/20/19 0201 09/21/19 0455  WBC 14.0* 13.4* 11.0* 9.4  NEUTROABS 10.4*  --   --   --   HGB 10.1* 8.8* 8.5* 8.8*  HCT 30.7* 27.8* 27.0* 27.0*  MCV 83.9 86.9 85.7 84.6  PLT 244 216 224 255   Cardiac Enzymes: No results for input(s): CKTOTAL, CKMB, CKMBINDEX, TROPONINI in the last 168 hours. BNP: BNP (last 3 results) Recent Labs    09/18/19 1458  BNP 43.0    ProBNP (last 3 results) No results for input(s): PROBNP in the last 8760 hours.  CBG: No results for  input(s): GLUCAP in the last 168 hours.     Signed:  Annita Brod, MD [facility] 09/21/2019, 12:55 PM

## 2019-09-21 NOTE — Evaluation (Signed)
Physical Therapy Evaluation Patient Details Name: Eric [name] MRN: [id number] DOB: 02-19-1951 Today's Date: 09/21/2019   History of Present Illness  Eric [name] is a [id number] who is admitted with acute PE. PT waited 48hours after initiation of therapeutic anticoagulation prior to evaluating per policy.  Clinical Impression  Pt admitted with above diagnosis. Pt currently with functional limitations due to the deficits listed below (see "PT Problem List"). Upon entry, pt in bed, awake and agreeable to participate. Pt at baseline for basic functional mobility independence. Pt still having SOB with exersion. Pt at 89% resting on room air, 85% walking on room air, 89% walking on 1L/min. Pt tolerating nearly 436ft AMB on 1L/min. Pt will benefit from skilled PT intervention to increase independence and safety with basic mobility in preparation for discharge to the venue listed below.       Follow Up Recommendations No PT follow up    Equipment Recommendations  None recommended by PT    Recommendations for Other Services       Precautions / Restrictions Precautions Precautions: Fall Restrictions Weight Bearing Restrictions: No      Mobility  Bed Mobility Overal bed mobility: Independent                Transfers Overall transfer level: Independent               General transfer comment: cautious, takes his time, moderate effort  Ambulation/Gait   Gait Distance (Feet): 380 Feet Assistive device: None   Gait velocity: 0.26m/s   General Gait Details: slow and guarded 2/2 breathing issues; 85% on room air after 164ft AMB; then 89% on 1L/min AMB around unit with slight DOE.  Stairs            Wheelchair Mobility    Modified Rankin (Stroke Patients Only)       Balance Overall balance assessment: Modified Independent;No apparent balance deficits (not formally assessed)                                           Pertinent Vitals/Pain Pain  Assessment: No/denies pain    Home Living Family/patient expects to be discharged to:: Private residence Living Arrangements: Spouse/significant other   Type of Home: House Home Access: Stairs to enter Entrance Stairs-Rails: Left Entrance Stairs-Number of Steps: 4 Home Layout: Multi-level;Able to live on main level with bedroom/bathroom(split level) Home Equipment: None      Prior Function Level of Independence: Independent         Comments: started chemo on Labor day and has had limited enerygy     Hand Dominance        Extremity/Trunk Assessment   Upper Extremity Assessment Upper Extremity Assessment: Overall WFL for tasks assessed    Lower Extremity Assessment Lower Extremity Assessment: Overall WFL for tasks assessed       Communication      Cognition Arousal/Alertness: Awake/alert Behavior During Therapy: WFL for tasks assessed/performed Overall Cognitive Status: Within Functional Limits for tasks assessed                                        General Comments      Exercises     Assessment/Plan    PT Assessment Patient needs continued PT services  PT Problem List Cardiopulmonary  status limiting activity;Decreased activity tolerance       PT Treatment Interventions Functional mobility training;Therapeutic activities;Therapeutic exercise    PT Goals (Current goals can be found in the Care Plan section)  Acute Rehab PT Goals Patient Stated Goal: return to baseline functional tolerance PT Goal Formulation: With patient Time For Goal Achievement: 09/21/19 Potential to Achieve Goals: Good    Frequency Min 2X/week   Barriers to discharge        Co-evaluation               AM-PAC PT "6 Clicks" Mobility  Outcome Measure Help needed turning from your back to your side while in a flat bed without using bedrails?: None Help needed moving from lying on your back to sitting on the side of a flat bed without using bedrails?:  None Help needed moving to and from a bed to a chair (including a wheelchair)?: None Help needed standing up from a chair using your arms (e.g., wheelchair or bedside chair)?: None Help needed to walk in hospital room?: None Help needed climbing 3-5 steps with a railing? : None 6 Click Score: 24    End of Session Equipment Utilized During Treatment: Oxygen Activity Tolerance: Patient tolerated treatment well;Patient limited by fatigue Patient left: in bed Nurse Communication: Mobility status PT Visit Diagnosis: Difficulty in walking, not elsewhere classified (R26.2);Other abnormalities of gait and mobility (R26.89)    Time: [id number] PT Time Calculation (min) (ACUTE ONLY): 17 min   Charges:   PT Evaluation $PT Eval Low Complexity: 1 Low PT Treatments $Therapeutic Exercise: 8-22 mins       9:06 AM, 09/21/19 Etta Grandchild, PT, DPT Physical Therapist - [facility]  [phone] ([place])    Buccola,Allan C 09/21/2019, 9:05 AM

## 2019-09-21 NOTE — Addendum Note (Signed)
Addended by: Sabino Gasser on: 09/21/2019 11:23 AM   Modules accepted: Orders

## 2019-09-21 NOTE — Consult Note (Signed)
[place] for Xarelto Indication: pulmonary embolus  Allergies  Allergen Reactions  . Allopurinol Rash  . Azithromycin Itching    Patient Measurements: Height: 6' (182.9 cm) Weight: 272 lb 9.6 oz (123.7 kg) IBW/kg (Calculated) : 77.6  Vital Signs: Temp: 97.7 F (36.5 C) (11/20 0748) Temp Source: Oral (11/20 0748) BP: 117/71 (11/20 0748) Pulse Rate: 80 (11/20 0748)  Labs: Recent Labs    09/18/19 1458 09/18/19 1703 09/18/19 2014  09/19/19 0352 09/19/19 1237 09/19/19 1944 09/20/19 0201 09/21/19 0455  HGB 10.1*  --   --   --  8.8*  --   --  8.5* 8.8*  HCT 30.7*  --   --   --  27.8*  --   --  27.0* 27.0*  PLT 244  --   --   --  216  --   --  224 255  APTT 37*  --   --   --   --   --   --   --   --   LABPROT 15.5*  --   --   --   --   --   --   --   --   INR 1.2  --   --   --   --   --   --   --   --   HEPARINUNFRC  --   --   --    < > 0.25* 0.20* 0.43 0.22*  --   CREATININE 0.94  --   --   --  0.91  --   --  0.81 0.83  TROPONINIHS 12 10 10   --  7  --   --   --   --    < > = values in this interval not displayed.    Estimated Creatinine Clearance: 118.9 mL/min (by C-G formula based on SCr of 0.83 mg/dL).   Medications:  No PTA anticoagulants, per patient   Assessment: Pharmacy has been consulted for Xarelto dosing in a patient with a PE.    Goal of Therapy:  Monitor platelets by anticoagulation protocol: Yes   Plan:  Will continue Xarelto 15mg  bid for a total of 21 days, followed by Xarelto 20mg  daily.  Heparin drip will be stopped with first dose of Xarelto.  Pharmacy has counseled patient regarding new anticoagulant medication.  Will check CBC/Scr a minimum of every 3 days per protocol.  Lu Duffel, PharmD, BCPS Clinical Pharmacist 09/21/2019 10:32 AM

## 2019-09-21 NOTE — Care Management Important Message (Signed)
Important Message  Patient Details  Name: Eric [name] MRN: [id number] Date of Birth: 08/23/1951   Medicare Important Message Given:  Yes     [name] 09/21/2019, 12:27 PM

## 2019-09-21 NOTE — Telephone Encounter (Signed)
#   Please cancel patient's appointment for next week;  # Please reschedule for November 30 or December 1-MD; CBC CMP LDH; R-CEOP; day2/3 etoposide; day 4-udenyca.   Thanks GB

## 2019-09-21 NOTE — Progress Notes (Signed)
SATURATION QUALIFICATIONS: (This note is used to comply with regulatory documentation for home oxygen)  Patient Saturations on Room Air at Rest = 89%  Patient Saturations on Room Air while Ambulating = 85%  Patient Saturations on 1L Liters of oxygen while Ambulating = 89%  Please briefly explain why patient needs home oxygen: Pts O2 sats drop while ambulating on RA.

## 2019-09-23 LAB — CULTURE, BLOOD (ROUTINE X 2)
Culture: NO GROWTH
Culture: NO GROWTH
Special Requests: ADEQUATE
Special Requests: ADEQUATE

## 2019-09-24 ENCOUNTER — Inpatient Hospital Stay: Payer: Medicare Other

## 2019-09-24 ENCOUNTER — Telehealth: Payer: Self-pay

## 2019-09-24 ENCOUNTER — Inpatient Hospital Stay: Payer: Medicare Other | Admitting: Internal Medicine

## 2019-09-24 NOTE — Telephone Encounter (Signed)
HFU scheduled 10/22/19.

## 2019-09-24 NOTE — Telephone Encounter (Signed)
Transition Care Management Follow-up Telephone Call  Date of discharge and from where: [facility] on 09/21/19  How have you been since you were released from the hospital? Feeling a lot better but is still tired. Abdominal pain is gone. SOB is better. Declines fever or n/v/d.  Any questions or concerns? No   Items Reviewed:  Did the pt receive and understand the discharge instructions provided? Yes   Medications obtained and verified? Yes   Any new allergies since your discharge? No   Dietary orders reviewed? Yes  Do you have support at home? Yes   Other (ie: DME, Home Health, etc) Portable O2 tank.   Functional Questionnaire: (I = Independent and D = Dependent)  Bathing/Dressing- I   Meal Prep- I  Eating- I  Maintaining continence- I  Transferring/Ambulation- I  Managing Meds- I   Follow up appointments reviewed:    PCP Hospital f/u appt confirmed? Yes  Scheduled to see Dr Caryn Section on 10/22/19 @ 10:20 AM.  [facility] f/u appt confirmed? Yes   Are transportation arrangements needed? No   If their condition worsens, is the pt aware to call  their PCP or go to the ED? Yes  Was the patient provided with contact information for the PCP's office or ED? Yes  Was the pt encouraged to call back with questions or concerns? Yes

## 2019-09-25 ENCOUNTER — Inpatient Hospital Stay: Payer: Medicare Other

## 2019-09-26 ENCOUNTER — Inpatient Hospital Stay: Payer: Medicare Other

## 2019-09-30 NOTE — Progress Notes (Signed)
Cardiology Office Note  Date:  10/01/2019   ID:  Eric, [name] 1951/07/17, MRN [id number]  PCP:  [name], MD   Chief Complaint  Patient presents with  . Other    Follow up from testing. Meds reviewed verbally with patient.     HPI:  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% on echocardiogram  started on t R-COP Presents for follow-up of his cardiomyopathy, recent diagnosis DVT and bilateral PE with respiratory distress  Adriamycin was previously not started secondary  to mildly depressed EF of 45-50% Adriamycin substitured for etoposide   Seen in the hospital November 2020 for pulmonary embolism Started on Xarelto 15 twice daily changed to 20 mg daily December 10   Reports that he had a massage, shortly after he developed severe sharp left-sided pleuritic chest pain as well as shortness of breath  Seen in the hospital, was hypoxic requiring 4 L nasal cannula CT scan was positive for bilateral pulmonary embolus.    Lower extremity venous Doppler results reviewed with him 1. Nonocclusive thrombus in the right popliteal vein, right posterior tibial vein and right peroneal vein.  Echo reviewed with him  1. Left ventricular ejection fraction, by visual estimation, is 55 to 60%. The left ventricle has normal function. There is mildly increased left ventricular hypertrophy.  2. Left ventricular diastolic parameters are consistent with Grade I diastolic dysfunction (impaired relaxation).  3. Global right ventricle has mildly reduced systolic function.The right ventricular size is moderately enlarged. No increase in right ventricular wall thickness.  CT scan reviewed with him   positive for acute pulmonary emboli throughout the lobar and proximal segmental pulmonary arteries 5 lobes of  the lungs with CT evidence of right heart strain (RV/LV Ratio = 1.06) consistent with at least submassive (intermediate risk) PE.   Reports his breathing is much improved, able to ambulate without significant symptoms Tolerating Xarelto 15 twice daily Scheduled to decrease down to 20 daily on December 10  Chronic lower extremity edema, does not wear compression hose  Other past medical history reviewed Echo 07/16/19: at [facility]  1. The left ventricle has mildly reduced systolic function, with an ejection fraction of 45-50%.   June 29, 2019-CT- bulky bilateral neck adenopathy left more than right;  6 x 4 x 4 cm mass at the tongue base/vallecula with displacement of the epiglottis and marked encroachment upon the oropharynx.  September 2020-Diffuse B-cell lymphoma; ABC subtype; NEGATIVE FISH studies.    PET scan -bulky adenopathy involving neck /lymphadenopathy mediastinum /abdomen/pelvis inguinal lymph nodes; splenic involvement.    CT ABD 2015: Scattered mixed calcified and noncalcified atherosclerotic plaque  within a normal caliber abdominal aorta.  Lab work reviewed with him in detail HBG 11.2, up from 9.1    PMH:   has a past medical history of Colon polyps, Pancreatitis, and Rectal bleeding (02/24/14).  PSH:    Past Surgical History:  Procedure Laterality Date  . CHOLECYSTECTOMY    . COLONOSCOPY  02/28/14  . IR IMAGING GUIDED PORT INSERTION  07/18/2019  . POLYPECTOMY     colon polyp removed  . TONSILLECTOMY      Current Outpatient Medications  Medication Sig Dispense Refill  . carvedilol (COREG) 6.25 MG tablet Take 1 tablet (6.25 mg total) by mouth 2 (two) times daily. 180 tablet 3  . lidocaine-prilocaine (EMLA)  cream Apply generously on the port 30-45 prior to accessing the port. 30 g 0  . Multiple Vitamins-Minerals (MULTI-VITAMIN/MINERALS PO) Take 1 capsule by mouth.    . Rivaroxaban 15 & 20 MG TBPK Follow package directions: Take one 15mg  tablet by mouth twice a  day. On day 20 (10/11/19), switch to one 20mg  tablet once a day. Take with food. 51 each 0   No current facility-administered medications for this visit.      Allergies:   Allopurinol, Azithromycin, and Losartan potassium   Social History:  The patient  reports that he has been smoking cigars. He has a 20.00 pack-year smoking history. He has never used smokeless tobacco. He reports current alcohol use. He reports that he does not use drugs.   Family History:   family history is not on file. He was adopted.    Review of Systems: Review of Systems  Constitutional: Negative.   HENT: Negative.   Respiratory: Positive for shortness of breath.   Cardiovascular: Positive for leg swelling.  Gastrointestinal: Negative.   Musculoskeletal: Negative.   Neurological: Negative.   Psychiatric/Behavioral: Negative.   All other systems reviewed and are negative.   PHYSICAL EXAM: VS:  BP 116/66 (BP Location: Left Arm, Patient Position: Sitting, Cuff Size: Normal)   Pulse 83   Ht 6' (1.829 m)   Wt 267 lb (121.1 kg)   SpO2 98%   BMI 36.21 kg/m  , BMI Body mass index is 36.21 kg/m. Constitutional:  oriented to person, place, and time. No distress.  HENT:  Head: Grossly normal Eyes:  no discharge. No scleral icterus.  Neck: No JVD, no carotid bruits  Cardiovascular: Regular rate and rhythm, no murmurs appreciated 1+ minimally pitting lower extremity edema bilaterally Pulmonary/Chest: Clear to auscultation bilaterally, no wheezes or rails Abdominal: Soft.  no distension.  no tenderness.  Musculoskeletal: Normal range of motion Neurological:  normal muscle tone. Coordination normal. No atrophy Skin: Skin warm and dry Psychiatric: normal affect, pleasant   Recent Labs: 09/18/2019: B Natriuretic Peptide 43.0 09/19/2019: Magnesium 2.0; TSH 1.134 09/20/2019: ALT 29 09/21/2019: BUN 13; Creatinine, Ser 0.83; Hemoglobin 8.8; Platelets 255; Potassium 3.6; Sodium 140    Lipid Panel No  results found for: CHOL, HDL, LDLCALC, TRIG    Wt Readings from Last 3 Encounters:  10/01/19 267 lb (121.1 kg)  09/21/19 272 lb 9.6 oz (123.7 kg)  09/03/19 272 lb 1.6 oz (123.4 kg)       ASSESSMENT AND PLAN:  Problem List Items Addressed This Visit      Cardiology Problems   Dilated cardiomyopathy ([place]) - Primary     Other   Morbid obesity ([place])   Compulsive tobacco user syndrome    Other Visit Diagnoses    SOB (shortness of breath)         Cardiomyopathy Etiology unclear, Recent echocardiogram with improved ejection fraction 55 to 60% Previously with suspected allergy/rash to losartan Has been able to tolerate carvedilol Continue current medications for now given improved ejection fraction and low blood pressure  Lymphoma Followed by hematology oncology  DVT/PE bilateral Studies detailed above reviewed with him, hospital course reviewed Feeling better, on anticoagulation Discussed return precautions bleeding melena hematuria nosebleeds Suspect he will need 6 months possibly even with low-dose Xarelto after that for prophylaxis  Hypertension Continue Coreg  Smoker Encourage smoking cessation  Aortic atherosclerosis Seen on CT scan in 2015, mild in nature descending distal aorta Discussed with him, we will not start a statin at this time,  needs aggressive diabetes management  Lower extremity edema Etiology unclear, concerning for lymphedema given chronicity Recommended compression hose especially in the setting of DVT recently   Disposition:   F/U  6 months  Recent hospital records reviewed, numerous imaging studies reviewed with him including lower extremity venous Doppler, CT scans, echocardiogram  Total encounter time more than 45 minutes  Greater than 50% was spent in counseling and coordination of care with the patient    Signed, Esmond Plants, M.D., Ph.D. [place], [place]

## 2019-10-01 ENCOUNTER — Other Ambulatory Visit: Payer: Self-pay

## 2019-10-01 ENCOUNTER — Encounter: Payer: Self-pay | Admitting: Internal Medicine

## 2019-10-01 ENCOUNTER — Encounter: Payer: Self-pay | Admitting: Cardiovascular Disease

## 2019-10-01 ENCOUNTER — Ambulatory Visit (INDEPENDENT_AMBULATORY_CARE_PROVIDER_SITE_OTHER): Payer: Medicare Other | Admitting: Cardiovascular Disease

## 2019-10-01 VITALS — BP 116/66 | HR 83 | Ht 72.0 in | Wt 267.0 lb

## 2019-10-01 DIAGNOSIS — I420 Dilated cardiomyopathy: Secondary | ICD-10-CM

## 2019-10-01 DIAGNOSIS — F17200 Nicotine dependence, unspecified, uncomplicated: Secondary | ICD-10-CM

## 2019-10-01 DIAGNOSIS — R0602 Shortness of breath: Secondary | ICD-10-CM | POA: Diagnosis not present

## 2019-10-01 NOTE — Patient Instructions (Signed)

## 2019-10-02 ENCOUNTER — Inpatient Hospital Stay: Payer: Medicare Other

## 2019-10-02 ENCOUNTER — Inpatient Hospital Stay: Payer: Medicare Other | Admitting: Internal Medicine

## 2019-10-02 ENCOUNTER — Other Ambulatory Visit: Payer: Self-pay

## 2019-10-02 ENCOUNTER — Inpatient Hospital Stay: Payer: Medicare Other | Attending: Internal Medicine

## 2019-10-02 ENCOUNTER — Encounter: Payer: Self-pay | Admitting: Licensed Clinical Social Worker

## 2019-10-02 VITALS — BP 109/67 | HR 65 | Resp 18

## 2019-10-02 DIAGNOSIS — Z5112 Encounter for antineoplastic immunotherapy: Secondary | ICD-10-CM | POA: Insufficient documentation

## 2019-10-02 DIAGNOSIS — C8590 Non-Hodgkin lymphoma, unspecified, unspecified site: Secondary | ICD-10-CM | POA: Diagnosis not present

## 2019-10-02 DIAGNOSIS — C8338 Diffuse large B-cell lymphoma, lymph nodes of multiple sites: Secondary | ICD-10-CM | POA: Diagnosis not present

## 2019-10-02 DIAGNOSIS — Z86711 Personal history of pulmonary embolism: Secondary | ICD-10-CM | POA: Insufficient documentation

## 2019-10-02 DIAGNOSIS — Z5189 Encounter for other specified aftercare: Secondary | ICD-10-CM | POA: Diagnosis not present

## 2019-10-02 DIAGNOSIS — Z7901 Long term (current) use of anticoagulants: Secondary | ICD-10-CM | POA: Insufficient documentation

## 2019-10-02 DIAGNOSIS — Z5111 Encounter for antineoplastic chemotherapy: Secondary | ICD-10-CM | POA: Diagnosis not present

## 2019-10-02 LAB — CBC WITH DIFFERENTIAL/PLATELET
Abs Immature Granulocytes: 0.13 10*3/uL — ABNORMAL HIGH (ref 0.00–0.07)
Basophils Absolute: 0.1 10*3/uL (ref 0.0–0.1)
Basophils Relative: 1 %
Eosinophils Absolute: 0.2 10*3/uL (ref 0.0–0.5)
Eosinophils Relative: 3 %
HCT: 36.8 % — ABNORMAL LOW (ref 39.0–52.0)
Hemoglobin: 11.1 g/dL — ABNORMAL LOW (ref 13.0–17.0)
Immature Granulocytes: 2 %
Lymphocytes Relative: 19 %
Lymphs Abs: 1.3 10*3/uL (ref 0.7–4.0)
MCH: 27.5 pg (ref 26.0–34.0)
MCHC: 30.2 g/dL (ref 30.0–36.0)
MCV: 91.3 fL (ref 80.0–100.0)
Monocytes Absolute: 0.7 10*3/uL (ref 0.1–1.0)
Monocytes Relative: 10 %
Neutro Abs: 4.4 10*3/uL (ref 1.7–7.7)
Neutrophils Relative %: 65 %
Platelets: 351 10*3/uL (ref 150–400)
RBC: 4.03 MIL/uL — ABNORMAL LOW (ref 4.22–5.81)
RDW: 19.6 % — ABNORMAL HIGH (ref 11.5–15.5)
WBC: 6.8 10*3/uL (ref 4.0–10.5)
nRBC: 0 % (ref 0.0–0.2)

## 2019-10-02 LAB — COMPREHENSIVE METABOLIC PANEL
ALT: 23 U/L (ref 0–44)
AST: 23 U/L (ref 15–41)
Albumin: 3.6 g/dL (ref 3.5–5.0)
Alkaline Phosphatase: 66 U/L (ref 38–126)
Anion gap: 8 (ref 5–15)
BUN: 14 mg/dL (ref 8–23)
CO2: 27 mmol/L (ref 22–32)
Calcium: 9.4 mg/dL (ref 8.9–10.3)
Chloride: 106 mmol/L (ref 98–111)
Creatinine, Ser: 0.85 mg/dL (ref 0.61–1.24)
GFR calc Af Amer: >60 mL/min (ref >60)
GFR calc non Af Amer: >60 mL/min (ref >60)
Glucose, Bld: 103 mg/dL — ABNORMAL HIGH (ref 70–99)
Potassium: 4.2 mmol/L (ref 3.5–5.1)
Sodium: 141 mmol/L (ref 135–145)
Total Bilirubin: 0.6 mg/dL (ref 0.3–1.2)
Total Protein: 6.7 g/dL (ref 6.5–8.1)

## 2019-10-02 LAB — LACTATE DEHYDROGENASE: LDH: 218 U/L — ABNORMAL HIGH (ref 98–192)

## 2019-10-02 MED ORDER — DIPHENHYDRAMINE HCL 25 MG PO CAPS
50.0000 mg | ORAL_CAPSULE | Freq: Once | ORAL | Status: AC
  Administered 2019-10-02: 50 mg via ORAL
  Filled 2019-10-02: qty 2

## 2019-10-02 MED ORDER — SODIUM CHLORIDE 0.9 % IV SOLN
50.0000 mg/m2 | Freq: Once | INTRAVENOUS | Status: AC
  Administered 2019-10-02: 130 mg via INTRAVENOUS
  Filled 2019-10-02: qty 6.5

## 2019-10-02 MED ORDER — SODIUM CHLORIDE 0.9 % IV SOLN
375.0000 mg/m2 | Freq: Once | INTRAVENOUS | Status: AC
  Administered 2019-10-02: 900 mg via INTRAVENOUS
  Filled 2019-10-02: qty 50

## 2019-10-02 MED ORDER — SODIUM CHLORIDE 0.9 % IV SOLN
2000.0000 mg | Freq: Once | INTRAVENOUS | Status: AC
  Administered 2019-10-02: 2000 mg via INTRAVENOUS
  Filled 2019-10-02: qty 100

## 2019-10-02 MED ORDER — SODIUM CHLORIDE 0.9 % IV SOLN
Freq: Once | INTRAVENOUS | Status: AC
  Administered 2019-10-02: 10:00:00 via INTRAVENOUS
  Filled 2019-10-02: qty 250

## 2019-10-02 MED ORDER — VINCRISTINE SULFATE CHEMO INJECTION 1 MG/ML
2.0000 mg | Freq: Once | INTRAVENOUS | Status: AC
  Administered 2019-10-02: 2 mg via INTRAVENOUS
  Filled 2019-10-02: qty 2

## 2019-10-02 MED ORDER — PALONOSETRON HCL INJECTION 0.25 MG/5ML
0.2500 mg | Freq: Once | INTRAVENOUS | Status: AC
  Administered 2019-10-02: 0.25 mg via INTRAVENOUS
  Filled 2019-10-02: qty 5

## 2019-10-02 MED ORDER — ACETAMINOPHEN 325 MG PO TABS
650.0000 mg | ORAL_TABLET | Freq: Once | ORAL | Status: AC
  Administered 2019-10-02: 10:00:00 650 mg via ORAL
  Filled 2019-10-02: qty 2

## 2019-10-02 MED ORDER — SODIUM CHLORIDE 0.9% FLUSH
10.0000 mL | INTRAVENOUS | Status: DC | PRN
  Administered 2019-10-02: 10 mL via INTRAVENOUS
  Filled 2019-10-02: qty 10

## 2019-10-02 MED ORDER — HEPARIN SOD (PORK) LOCK FLUSH 100 UNIT/ML IV SOLN
500.0000 [IU] | Freq: Once | INTRAVENOUS | Status: AC
  Administered 2019-10-02: 500 [IU] via INTRAVENOUS
  Filled 2019-10-02: qty 5

## 2019-10-02 MED ORDER — SODIUM CHLORIDE 0.9 % IV SOLN
Freq: Once | INTRAVENOUS | Status: AC
  Administered 2019-10-02: 10:00:00 via INTRAVENOUS
  Filled 2019-10-02: qty 5

## 2019-10-02 NOTE — Assessment & Plan Note (Addendum)
#  Diffuse large B-cell lymphoma-ABC subtype;  Based on PET scan; stage IV [involvement of the spleen]; s/p R-CEOP s/p #2.   # proceed with R-CEOP #3 today; Labs today reviewed;  acceptable for treatment today.  Will get PET scan after 4 cycles as recent CT C/A/P- was reassuring.   # Bil acute PE- [nov 2020]- STABLE;  on xarelto 15 mg BID; switch to xarelto 20 mg/day on 12/10.   # Borderline LOW EF of 45-50% [see below]- on coreg BID.   # DISPOSITION:  # Proceed with chemo- this week as planned.  # in 3 weeks- MD-labs-cbc/Cmp/ldh- RCEOP; day-2 etop/d-3-Etop; d-4; udenyca- Dr.B

## 2019-10-02 NOTE — Progress Notes (Signed)
[facility] NOTE  Patient Care Team: [name], MD as PCP - General (Family Medicine) [name] [name], MD (General Surgery)  CHIEF COMPLAINTS/PURPOSE OF CONSULTATION: Diffuse large B-cell lymphoma  #  Oncology History Overview Note  #June 29, 2019-CT- bulky bilateral neck adenopathy left more than right; September 2020-Diffuse B-cell lymphoma; ABC subtype; NEGATIVE FISH studies.  PET scan -bulky adenopathy involving neck /lymphadenopathy mediastinum /abdomen/pelvis inguinal lymph nodes; splenic involvement.  Question bone.  Stage IV [spleen involvement; no bone marrow biopsy].  BCL-2: Positive /BCL 6: Positive /MUM-1: Positive /CD10: Negative 'Ki-67: High proliferation index (90%) /CD30: Negative /Cyclin D1: Negative C-MYC: Equivocal (20% staining seen on limited and fragmented tissue) /EBER: Negative   # Ritux-Pred- 9/11; 9/18- R-COP [held adria- sec to EF 45-50%]  # Sep 2020- Borderline low EF/Left ventricular hypokinesis [Dr.Gollan]-s October 9th stress test-nonischemic;   # DIAGNOSIS: DLBCL  STAGE:   4      ;GOALS: Cure  CURRENT/MOST RECENT THERAPY : R-COP/-R-CEOP.     Lymphoma of lymph nodes ([place])  07/03/2019 Initial Diagnosis   Lymphoma of lymph nodes ([place])   07/13/2019 -  Chemotherapy   The patient had palonosetron (ALOXI) injection 0.25 mg, 0.25 mg, Intravenous,  Once, 3 of 6 cycles Administration: 0.25 mg (07/20/2019), 0.25 mg (08/13/2019), 0.25 mg (09/03/2019) pegfilgrastim (NEULASTA) injection 6 mg, 6 mg, Subcutaneous, Once, 0 of 1 cycle pegfilgrastim (NEULASTA ONPRO KIT) injection 6 mg, 6 mg, Subcutaneous, Once, 0 of 1 cycle pegfilgrastim-cbqv (UDENYCA) injection 6 mg, 6 mg, Subcutaneous, Once, 3 of 4 cycles Administration: 6 mg (07/23/2019), 6 mg (08/16/2019), 6 mg (09/06/2019) vinCRIStine (ONCOVIN) 2 mg in sodium chloride 0.9 % 50 mL chemo infusion, 2 mg, Intravenous,  Once, 3 of 6 cycles Administration: 2 mg (07/20/2019), 2 mg  (08/13/2019), 2 mg (09/03/2019) cyclophosphamide (CYTOXAN) 1,900 mg in sodium chloride 0.9 % 250 mL chemo infusion, 750 mg/m2 = 1,900 mg, Intravenous,  Once, 3 of 6 cycles Administration: 1,900 mg (07/20/2019), 1,900 mg (08/13/2019), 1,900 mg (09/03/2019) etoposide (VEPESID) 250 mg in sodium chloride 0.9 % 1,000 mL chemo infusion, 100 mg/m2 = 250 mg, Intravenous,  Once, 2 of 5 cycles Dose modification: 50 mg/m2 (original dose 100 mg/m2, Cycle 3, Reason: Provider Judgment) Administration: 130 mg (08/14/2019), 130 mg (08/15/2019), 130 mg (09/03/2019), 130 mg (09/04/2019), 130 mg (09/05/2019) dexamethasone (DECADRON) 12 mg in sodium chloride 0.9 % 145 mL IVPB, , Intravenous,  Once, 4 of 7 cycles Administration:  (07/13/2019),  (07/20/2019),  (08/13/2019),  (09/03/2019) riTUXimab-pvvr (RUXIENCE) 900 mg in sodium chloride 0.9 % 250 mL (2.6471 mg/mL) infusion, 375 mg/m2 = 900 mg (100 % of original dose 375 mg/m2), Intravenous,  Once, 1 of 1 cycle Dose modification: 375 mg/m2 (original dose 375 mg/m2, Cycle 1) Administration: 900 mg (07/13/2019)  for chemotherapy treatment.      HISTORY OF PRESENTING ILLNESS:  Eric [name] 68 y.o.  male with recently due for lab B-cell lymphoma is currently status post R-CEOP #2 approximately 4 weeks ago weeks ago is here for follow-up/proceed with chemotherapy.  In the interim patient was recently admitted to hospital for left chest wall pain; CTA-showed bilateral PE.  Dopplers positive for right lower extremity nonocclusive DVT.  Patient was initially treated with IV heparin; switch over to Xarelto currently.  Patient today feels much improved.  Appetite is good.  No weight loss.  Shortness of breath improved.  Leg swelling improving..  Review of Systems  Constitutional: Positive for malaise/fatigue. Negative for chills, diaphoresis and fever.  HENT: Negative for nosebleeds and sore throat.   Eyes: Negative for double vision.  Respiratory: Negative for hemoptysis,  sputum production and wheezing.   Cardiovascular: Negative for chest pain, palpitations, orthopnea and leg swelling.  Gastrointestinal: Negative for abdominal pain, blood in stool, constipation, diarrhea, heartburn, melena, nausea and vomiting.  Genitourinary: Negative for dysuria, frequency and urgency.  Musculoskeletal: Negative for back pain and joint pain.  Skin: Negative.  Negative for itching and rash.  Neurological: Negative for dizziness, tingling, focal weakness, weakness and headaches.  Endo/Heme/Allergies: Does not bruise/bleed easily.  Psychiatric/Behavioral: Negative for depression. The patient is nervous/anxious. The patient does not have insomnia.      MEDICAL HISTORY:  Past Medical History:  Diagnosis Date  . Colon polyps   . Pancreatitis   . Rectal bleeding 02/24/14    SURGICAL HISTORY: Past Surgical History:  Procedure Laterality Date  . CHOLECYSTECTOMY    . COLONOSCOPY  02/28/14  . IR IMAGING GUIDED PORT INSERTION  07/18/2019  . POLYPECTOMY     colon polyp removed  . TONSILLECTOMY      SOCIAL HISTORY: Social History   Socioeconomic History  . Marital status: Married    Spouse name: Not on file  . Number of children: Not on file  . Years of education: Not on file  . Highest education level: Not on file  Occupational History    Comment: Retired from [employer]  . Financial resource strain: Not on file  . Food insecurity    Worry: Not on file    Inability: Not on file  . Transportation needs    Medical: Not on file    Non-medical: Not on file  Tobacco Use  . Smoking status: Current Every Day Smoker    Packs/day: 1.00    Years: 20.00    Pack years: 20.00    Types: Cigars  . Smokeless tobacco: Never Used  . Tobacco comment: smoke 1/2 ppd cigaretes since 68 yo. changed to cigars at age July 2004.   Substance and Sexual Activity  . Alcohol use: Yes    Comment: 6-7 drinks weekly  . Drug use: No  . Sexual activity: Not on file  Lifestyle   . Physical activity    Days per week: Not on file    Minutes per session: Not on file  . Stress: Not on file  Relationships  . Social Herbalist on phone: Not on file    Gets together: Not on file    Attends religious service: Not on file    Active member of club or organization: Not on file    Attends meetings of clubs or organizations: Not on file    Relationship status: Not on file  . Intimate partner violence    Fear of current or ex partner: Not on file    Emotionally abused: Not on file    Physically abused: Not on file    Forced sexual activity: Not on file  Other Topics Concern  . Not on file  Social History Narrative   Lives at home with wife; in [place]. retd from [employer] smoker; drinks liquor. Children - grown up.     FAMILY HISTORY: Family History  Adopted: Yes    ALLERGIES:  is allergic to allopurinol; azithromycin; and losartan potassium.  MEDICATIONS:  Current Outpatient Medications  Medication Sig Dispense Refill  . carvedilol (COREG) 6.25 MG tablet Take 1 tablet (6.25 mg total) by mouth 2 (two) times daily. [place]  tablet 3  . lidocaine-prilocaine (EMLA) cream Apply generously on the port 30-45 prior to accessing the port. 30 g 0  . Rivaroxaban 15 & 20 MG TBPK Follow package directions: Take one 83m tablet by mouth twice a day. On day 20 (10/11/19), switch to one 233mtablet once a day. Take with food. 51 each 0  . Multiple Vitamins-Minerals (MULTI-VITAMIN/MINERALS PO) Take 1 capsule by mouth.     No current facility-administered medications for this visit.    Facility-Administered Medications Ordered in Other Visits  Medication Dose Route Frequency Provider Last Rate Last Dose  . heparin lock flush 100 unit/mL  500 Units Intravenous Once BrCharlaine Dalton, MD      . sodium chloride flush (NS) 0.9 % injection 10 mL  10 mL Intravenous PRN BrCammie SickleMD   10 mL at 10/02/19 0834      .  PHYSICAL EXAMINATION: ECOG  PERFORMANCE STATUS: 1 - Symptomatic but completely ambulatory  Vitals:   10/02/19 0856  BP: 125/87  Pulse: 68  Temp: (!) 97 F (36.1 C)   Filed Weights   10/02/19 0856  Weight: 267 lb (121.1 kg)    Physical Exam  Constitutional: He is oriented to person, place, and time and well-developed, well-nourished, and in no distress.  HENT:  Head: Normocephalic and atraumatic.  Mouth/Throat: Oropharynx is clear and moist. No oropharyngeal exudate.  Eyes: Pupils are equal, round, and reactive to light.  Neck: Normal range of motion. Neck supple.  Resolved neck/axillary adenopathy.  Cardiovascular: Normal rate and regular rhythm.  Pulmonary/Chest: Effort normal and breath sounds normal. No respiratory distress. He has no wheezes.  Abdominal: Soft. Bowel sounds are normal. He exhibits no distension and no mass. There is no abdominal tenderness. There is no rebound and no guarding.  Musculoskeletal: Normal range of motion.        General: No tenderness or edema.  Neurological: He is alert and oriented to person, place, and time.  Skin: Skin is warm.  Psychiatric: Affect normal.     LABORATORY DATA:  I have reviewed the data as listed Lab Results  Component Value Date   WBC 6.8 10/02/2019   HGB 11.1 (L) 10/02/2019   HCT 36.8 (L) 10/02/2019   MCV 91.3 10/02/2019   PLT 351 10/02/2019   Recent Labs    09/18/19 1458  09/20/19 0201 09/21/19 0455 10/02/19 0834  NA 136   < > 138 140 141  K 4.0   < > 3.8 3.6 4.2  CL 105   < > 108 110 106  CO2 23   < > _0 GLUCOSE 120*   < > 105* 101* 103*  BUN 20   < > _1 CREATININE 0.94   < > 0.81 0.83 0.85  CALCIUM 8.8*   < > 8.3* 8.5* 9.4  GFRNONAA >60   < > >60 >60 >60  GFRAA >60   < > >60 >60 >60  PROT 6.6  --  5.7*  --  6.7  ALBUMIN 3.4*  --  2.8*  --  3.6  AST 29  --  23  --  23  ALT 32  --  29  --  23  ALKPHOS 71  --  65  --  66  BILITOT 1.3*  --  0.9  --  0.6   < > = values in this interval not displayed.     RADIOGRAPHIC STUDIES: I have personally reviewed the radiological images  as listed and agreed with the findings in the report. Ct Angio Chest Pe W And/or Wo Contrast  Result Date: 09/18/2019 CLINICAL DATA:  Severe pain, left axilla worsened with deep inspiration, PE suspected, high pretest probability additionally, acute generalized abdominal pain with fever worse in the left upper quadrant and left flank and lumbar ways CT EXAM: CT ANGIOGRAPHY CHEST CT ABDOMEN AND PELVIS WITH CONTRAST TECHNIQUE: Multidetector CT imaging of the chest was performed using the standard protocol during bolus administration of intravenous contrast. Multiplanar CT image reconstructions and MIPs were obtained to evaluate the vascular anatomy. Multidetector CT imaging of the abdomen and pelvis was performed using the standard protocol during bolus administration of intravenous contrast. CONTRAST:  61m OMNIPAQUE IOHEXOL 350 MG/ML SOLN COMPARISON:  CT abdomen pelvis 02/24/2014 FINDINGS: CTA CHEST FINDINGS Cardiovascular: Suboptimal opacification of pulmonary arteries (main pulmonary artery bolus measuring 175 HU). Such finding may limit evaluation of the segmental and subsegmental pulmonary arteries. However, there is extensive pulmonary arterial filling defect seen throughout the lobar and proximal segmental pulmonary arteries of all 5 lobes mild central pulmonary artery enlargement is noted as well as elevation of the RV/LV ratio (1.06). Mild cardiomegaly. No pericardial effusion. Few coronary artery calcifications are present. Normal caliber thoracic aorta with scant atherosclerotic plaque. Normal 3 vessel branching of the arch with minimal calcification in the proximal great vessels. Tunneled right IJ Port-A-Cath tip terminates at the superior cavoatrial junction with port access at this time. Small pericaval lipoma. Major venous structures are otherwise unremarkable. Mediastinum/Nodes: Scattered low-attenuation, subcentimeter  mediastinal hilar adenopathy. No pathologically enlarged mediastinal, hilar or axillary nodes. No acute abnormality of the trachea or esophagus. Thyroid gland and thoracic inlet are unremarkable. Lungs/Pleura: There are peripheral areas wedge-shaped opacity which could reflect sequela of wedge pulmonary infarcts versus a superimposed infectious process given more diffuse hazy ground-glass opacity throughout the lungs. Mild airways thickening is noted. Trace left effusion is seen. No pneumothorax. Bibasilar areas of atelectasis. Musculoskeletal: Multilevel degenerative changes are present in the imaged portions of the spine. Mild degenerative changes in the shoulders. No acute osseous abnormality or suspicious osseous lesion. No concerning chest wall lesions. Review of the MIP images confirms the above findings. CT ABDOMEN and PELVIS FINDINGS Hepatobiliary: Stable dystrophic capsular calcifications along the posterior right lobe liver, unchanged from comparison. No focal liver abnormality is seen. Patient is post cholecystectomy. Slight prominence of the biliary tree likely related to reservoir effect. No calcified intraductal gallstones. Pancreas: Unremarkable. No pancreatic ductal dilatation or surrounding inflammatory changes. Spleen: Splenomegaly. No concerning focal splenic lesions. Adrenals/Urinary Tract: Mild bilateral symmetric perinephric stranding, a nonspecific finding though may correlate with either age or decreased renal function. Findings unchanged from prior. Fluid attenuation cyst in the interpolar left kidney measuring 3.4 cm, increased in size from comparison but without concerning features. Multiple coarse calcifications are again seen in the left renal collecting system including a small 16 mm developing staghorn calculus seen in the lower renal pelvis with obstruction of the lower pole calices. No other obstructive uropathy or ureteral calculi. Normal urinary bladder. Stomach/Bowel: Distal  esophagus, stomach and duodenal sweep are unremarkable. No small bowel wall thickening or dilatation. No evidence of obstruction. A normal appendix is visualized. No colonic dilatation or wall thickening. Scattered colonic diverticula without focal pericolonic inflammation to suggest diverticulitis. Vascular/Lymphatic: Atherosclerotic plaque within the normal caliber aorta. No suspicious or enlarged lymph nodes in the included lymphatic chains. Few calcified lymph nodes noted in the abdomen, similar comparison study. Reproductive: Coarse eccentric  calcification of the prostate. No concerning abnormalities of the prostate or seminal vesicles. Other: No abdominopelvic free fluid or free gas. No bowel containing hernias. Bilateral fat containing inguinal hernias. Musculoskeletal: Multilevel degenerative changes are present in the imaged portions of the spine. 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 No acute osseous abnormality or suspicious osseous lesion. Additional mild degenerative changes in the hips. Review of the MIP images confirms the above findings. IMPRESSION: 1. Suboptimal opacification of the pulmonary arteries (main pulmonary artery bolus measuring 175 HU). However, exam is positive for acute pulmonary emboli throughout the lobar and proximal segmental pulmonary arteries 5 lobes of the lungs with CT evidence of right heart strain (RV/LV Ratio = 1.06) consistent with at least submassive (intermediate risk) PE. The presence of right heart strain has been associated with an increased risk of morbidity and mortality. Please activate Code PE by paging [phone]. 2. Peripheral predominant areas of consolidation some of which have a wedge-shaped appearance which could reflect sequela of wedge pulmonary infarcts versus a superimposed infectious process given a background more diffuse hazy ground-glass opacity throughout the lungs. Trace left effusion. 3. Multiple coarse calcifications in  the left renal collecting system including a 16 mm developing staghorn calculus in the lower pole calices with obstruction of the lower pole calices and layering milk of calcium. No other obstructive uropathy or ureteral calculi. 4. No other acute abnormality in the abdomen or pelvis. 5. Diverticulosis without evidence of acute diverticulitis. 6. Aortic Atherosclerosis (ICD10-I70.0). These results were called by telephone at the time of interpretation on 09/18/2019 at 5:24 pm to provider Merlyn Lot , who verbally acknowledged these results. Electronically Signed   By: Lovena Le M.D.   On: 09/18/2019 17:24   Ct Abdomen Pelvis W Contrast  Result Date: 09/18/2019 CLINICAL DATA:  Severe pain, left axilla worsened with deep inspiration, PE suspected, high pretest probability additionally, acute generalized abdominal pain with fever worse in the left upper quadrant and left flank and lumbar ways CT EXAM: CT ANGIOGRAPHY CHEST CT ABDOMEN AND PELVIS WITH CONTRAST TECHNIQUE: Multidetector CT imaging of the chest was performed using the standard protocol during bolus administration of intravenous contrast. Multiplanar CT image reconstructions and MIPs were obtained to evaluate the vascular anatomy. Multidetector CT imaging of the abdomen and pelvis was performed using the standard protocol during bolus administration of intravenous contrast. CONTRAST:  96m OMNIPAQUE IOHEXOL 350 MG/ML SOLN COMPARISON:  CT abdomen pelvis 02/24/2014 FINDINGS: CTA CHEST FINDINGS Cardiovascular: Suboptimal opacification of pulmonary arteries (main pulmonary artery bolus measuring 175 HU). Such finding may limit evaluation of the segmental and subsegmental pulmonary arteries. However, there is extensive pulmonary arterial filling defect seen throughout the lobar and proximal segmental pulmonary arteries of all 5 lobes mild central pulmonary artery enlargement is noted as well as elevation of the RV/LV ratio (1.06). Mild cardiomegaly.  No pericardial effusion. Few coronary artery calcifications are present. Normal caliber thoracic aorta with scant atherosclerotic plaque. Normal 3 vessel branching of the arch with minimal calcification in the proximal great vessels. Tunneled right IJ Port-A-Cath tip terminates at the superior cavoatrial junction with port access at this time. Small pericaval lipoma. Major venous structures are otherwise unremarkable. Mediastinum/Nodes: Scattered low-attenuation, subcentimeter mediastinal hilar adenopathy. No pathologically enlarged mediastinal, hilar or axillary nodes. No acute abnormality of the trachea or esophagus. Thyroid gland and thoracic inlet are unremarkable. Lungs/Pleura: There are peripheral areas wedge-shaped opacity which could reflect sequela of wedge pulmonary infarcts versus a superimposed infectious process given more diffuse hazy  ground-glass opacity throughout the lungs. Mild airways thickening is noted. Trace left effusion is seen. No pneumothorax. Bibasilar areas of atelectasis. Musculoskeletal: Multilevel degenerative changes are present in the imaged portions of the spine. Mild degenerative changes in the shoulders. No acute osseous abnormality or suspicious osseous lesion. No concerning chest wall lesions. Review of the MIP images confirms the above findings. CT ABDOMEN and PELVIS FINDINGS Hepatobiliary: Stable dystrophic capsular calcifications along the posterior right lobe liver, unchanged from comparison. No focal liver abnormality is seen. Patient is post cholecystectomy. Slight prominence of the biliary tree likely related to reservoir effect. No calcified intraductal gallstones. Pancreas: Unremarkable. No pancreatic ductal dilatation or surrounding inflammatory changes. Spleen: Splenomegaly. No concerning focal splenic lesions. Adrenals/Urinary Tract: Mild bilateral symmetric perinephric stranding, a nonspecific finding though may correlate with either age or decreased renal function.  Findings unchanged from prior. Fluid attenuation cyst in the interpolar left kidney measuring 3.4 cm, increased in size from comparison but without concerning features. Multiple coarse calcifications are again seen in the left renal collecting system including a small 16 mm developing staghorn calculus seen in the lower renal pelvis with obstruction of the lower pole calices. No other obstructive uropathy or ureteral calculi. Normal urinary bladder. Stomach/Bowel: Distal esophagus, stomach and duodenal sweep are unremarkable. No small bowel wall thickening or dilatation. No evidence of obstruction. A normal appendix is visualized. No colonic dilatation or wall thickening. Scattered colonic diverticula without focal pericolonic inflammation to suggest diverticulitis. Vascular/Lymphatic: Atherosclerotic plaque within the normal caliber aorta. No suspicious or enlarged lymph nodes in the included lymphatic chains. Few calcified lymph nodes noted in the abdomen, similar comparison study. Reproductive: Coarse eccentric calcification of the prostate. No concerning abnormalities of the prostate or seminal vesicles. Other: No abdominopelvic free fluid or free gas. No bowel containing hernias. Bilateral fat containing inguinal hernias. Musculoskeletal: Multilevel degenerative changes are present in the imaged portions of the spine. 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 No acute osseous abnormality or suspicious osseous lesion. Additional mild degenerative changes in the hips. Review of the MIP images confirms the above findings. IMPRESSION: 1. Suboptimal opacification of the pulmonary arteries (main pulmonary artery bolus measuring 175 HU). However, exam is positive for acute pulmonary emboli throughout the lobar and proximal segmental pulmonary arteries 5 lobes of the lungs with CT evidence of right heart strain (RV/LV Ratio = 1.06) consistent with at least submassive (intermediate risk) PE. The  presence of right heart strain has been associated with an increased risk of morbidity and mortality. Please activate Code PE by paging [phone]. 2. Peripheral predominant areas of consolidation some of which have a wedge-shaped appearance which could reflect sequela of wedge pulmonary infarcts versus a superimposed infectious process given a background more diffuse hazy ground-glass opacity throughout the lungs. Trace left effusion. 3. Multiple coarse calcifications in the left renal collecting system including a 16 mm developing staghorn calculus in the lower pole calices with obstruction of the lower pole calices and layering milk of calcium. No other obstructive uropathy or ureteral calculi. 4. No other acute abnormality in the abdomen or pelvis. 5. Diverticulosis without evidence of acute diverticulitis. 6. Aortic Atherosclerosis (ICD10-I70.0). These results were called by telephone at the time of interpretation on 09/18/2019 at 5:24 pm to provider Merlyn Lot , who verbally acknowledged these results. Electronically Signed   By: Lovena Le M.D.   On: 09/18/2019 17:24   US Venous Img Lower Bilateral (dvt)  Result Date: 09/19/2019 CLINICAL DATA:  Bilateral pulmonary embolism. EXAM:  BILATERAL LOWER EXTREMITY VENOUS DOPPLER ULTRASOUND TECHNIQUE: Gray-scale sonography with graded compression, as well as color Doppler and duplex ultrasound were performed to evaluate the lower extremity deep venous systems from the level of the common femoral vein and including the common femoral, femoral, profunda femoral, popliteal and calf veins including the posterior tibial, peroneal and gastrocnemius veins when visible. The superficial great saphenous vein was also interrogated. Spectral Doppler was utilized to evaluate flow at rest and with distal augmentation maneuvers in the common femoral, femoral and popliteal veins. COMPARISON:  None. FINDINGS: RIGHT LOWER EXTREMITY Common Femoral Vein: No evidence of  thrombus. Normal compressibility, respiratory phasicity and response to augmentation. Saphenofemoral Junction: No evidence of thrombus. Normal compressibility and flow on color Doppler imaging. Profunda Femoral Vein: No evidence of thrombus. Normal compressibility and flow on color Doppler imaging. Femoral Vein: No evidence of thrombus. Normal compressibility, respiratory phasicity and response to augmentation. Popliteal Vein: There is some nonocclusive thrombus in the right popliteal vein. Calf Veins: There is some nonocclusive thrombus in the right posterior tibial and peroneal veins. Superficial Great Saphenous Vein: No evidence of thrombus. Normal compressibility. Venous Reflux:  None. Other Findings: No evidence of superficial thrombophlebitis or abnormal fluid collection. LEFT LOWER EXTREMITY Common Femoral Vein: No evidence of thrombus. Normal compressibility, respiratory phasicity and response to augmentation. Saphenofemoral Junction: No evidence of thrombus. Normal compressibility and flow on color Doppler imaging. Profunda Femoral Vein: No evidence of thrombus. Normal compressibility and flow on color Doppler imaging. Femoral Vein: No evidence of thrombus. Normal compressibility, respiratory phasicity and response to augmentation. Popliteal Vein: No evidence of thrombus. Normal compressibility, respiratory phasicity and response to augmentation. Calf Veins: No evidence of thrombus. Normal compressibility and flow on color Doppler imaging. Superficial Great Saphenous Vein: No evidence of thrombus. Normal compressibility. Venous Reflux:  None. Other Findings: No evidence of superficial thrombophlebitis or abnormal fluid collection. IMPRESSION: 1. Nonocclusive thrombus in the right popliteal vein, right posterior tibial vein and right peroneal vein. 2. No evidence of left lower extremity deep venous thrombosis. Electronically Signed   By: Aletta Edouard M.D.   On: 09/19/2019 09:05   Dg Chest Port 1  View  Result Date: 09/18/2019 CLINICAL DATA:  Shortness of breath EXAM: PORTABLE CHEST 1 VIEW COMPARISON:  None. FINDINGS: There is mild cardiomegaly. There is mild prominence to the central pulmonary vasculature. There is blunting of the bilateral costophrenic angles which could be small bilateral pleural effusions, right greater than left. A right-sided MediPort catheter seen with the tip in the mid SVC. No acute osseous abnormality. IMPRESSION: Mild cardiomegaly and central pulmonary vascular congestion. Small bilateral pleural effusions, right greater than left. Electronically Signed   By: Prudencio Pair M.D.   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   # proceed with R-CEOP #3 today; Labs today reviewed;  acceptable for treatment today.  Will get PET scan after 4 cycles as recent CT C/A/P- was reassuring.   # Bil acute PE- [nov 2020]- STABLE;  on xarelto 15 mg BID; switch to xarelto 20 mg/day on 12/10.   # Borderline LOW EF of 45-50% [see below]- on coreg BID.   # DISPOSITION:  # Proceed with chemo- this week as planned.  # in 3 weeks- MD-labs-cbc/Cmp/ldh- RCEOP; day-2 etop/d-3-Etop; d-4; udenyca- Dr.B    All questions were answered. The patient  knows to call the clinic with any problems, questions or concerns.    Cammie Sickle, MD 10/02/2019 9:28 AM

## 2019-10-03 ENCOUNTER — Inpatient Hospital Stay: Payer: Medicare Other

## 2019-10-03 ENCOUNTER — Other Ambulatory Visit: Payer: Self-pay

## 2019-10-03 VITALS — BP 131/81 | HR 67 | Temp 96.9°F | Resp 20

## 2019-10-03 DIAGNOSIS — Z5189 Encounter for other specified aftercare: Secondary | ICD-10-CM | POA: Diagnosis not present

## 2019-10-03 DIAGNOSIS — Z5112 Encounter for antineoplastic immunotherapy: Secondary | ICD-10-CM | POA: Diagnosis not present

## 2019-10-03 DIAGNOSIS — C8338 Diffuse large B-cell lymphoma, lymph nodes of multiple sites: Secondary | ICD-10-CM | POA: Diagnosis not present

## 2019-10-03 DIAGNOSIS — Z86711 Personal history of pulmonary embolism: Secondary | ICD-10-CM | POA: Diagnosis not present

## 2019-10-03 DIAGNOSIS — Z7901 Long term (current) use of anticoagulants: Secondary | ICD-10-CM | POA: Diagnosis not present

## 2019-10-03 DIAGNOSIS — C8590 Non-Hodgkin lymphoma, unspecified, unspecified site: Secondary | ICD-10-CM

## 2019-10-03 DIAGNOSIS — Z5111 Encounter for antineoplastic chemotherapy: Secondary | ICD-10-CM | POA: Diagnosis not present

## 2019-10-03 MED ORDER — SODIUM CHLORIDE 0.9 % IV SOLN
50.0000 mg/m2 | Freq: Once | INTRAVENOUS | Status: AC
  Administered 2019-10-03: 130 mg via INTRAVENOUS
  Filled 2019-10-03: qty 6.5

## 2019-10-03 MED ORDER — SODIUM CHLORIDE 0.9% FLUSH
10.0000 mL | Freq: Once | INTRAVENOUS | Status: AC
  Administered 2019-10-03: 10 mL via INTRAVENOUS
  Filled 2019-10-03: qty 10

## 2019-10-03 MED ORDER — DEXAMETHASONE SODIUM PHOSPHATE 10 MG/ML IJ SOLN
10.0000 mg | Freq: Once | INTRAMUSCULAR | Status: AC
  Administered 2019-10-03: 10 mg via INTRAVENOUS
  Filled 2019-10-03: qty 1

## 2019-10-03 MED ORDER — HEPARIN SOD (PORK) LOCK FLUSH 100 UNIT/ML IV SOLN
500.0000 [IU] | Freq: Once | INTRAVENOUS | Status: AC
  Administered 2019-10-03: 500 [IU] via INTRAVENOUS
  Filled 2019-10-03: qty 5

## 2019-10-03 MED ORDER — SODIUM CHLORIDE 0.9 % IV SOLN
INTRAVENOUS | Status: DC
  Administered 2019-10-03: 09:00:00 via INTRAVENOUS
  Filled 2019-10-03: qty 250

## 2019-10-04 ENCOUNTER — Inpatient Hospital Stay: Payer: Medicare Other

## 2019-10-04 ENCOUNTER — Other Ambulatory Visit: Payer: Self-pay

## 2019-10-04 VITALS — BP 122/64 | HR 63 | Temp 98.1°F | Resp 20

## 2019-10-04 DIAGNOSIS — C8590 Non-Hodgkin lymphoma, unspecified, unspecified site: Secondary | ICD-10-CM

## 2019-10-04 DIAGNOSIS — C8338 Diffuse large B-cell lymphoma, lymph nodes of multiple sites: Secondary | ICD-10-CM | POA: Diagnosis not present

## 2019-10-04 DIAGNOSIS — Z5189 Encounter for other specified aftercare: Secondary | ICD-10-CM | POA: Diagnosis not present

## 2019-10-04 DIAGNOSIS — Z5111 Encounter for antineoplastic chemotherapy: Secondary | ICD-10-CM | POA: Diagnosis not present

## 2019-10-04 DIAGNOSIS — Z5112 Encounter for antineoplastic immunotherapy: Secondary | ICD-10-CM | POA: Diagnosis not present

## 2019-10-04 DIAGNOSIS — Z7901 Long term (current) use of anticoagulants: Secondary | ICD-10-CM | POA: Diagnosis not present

## 2019-10-04 DIAGNOSIS — Z86711 Personal history of pulmonary embolism: Secondary | ICD-10-CM | POA: Diagnosis not present

## 2019-10-04 MED ORDER — DEXAMETHASONE SODIUM PHOSPHATE 10 MG/ML IJ SOLN
10.0000 mg | Freq: Once | INTRAMUSCULAR | Status: AC
  Administered 2019-10-04: 10 mg via INTRAVENOUS
  Filled 2019-10-04: qty 1

## 2019-10-04 MED ORDER — HEPARIN SOD (PORK) LOCK FLUSH 100 UNIT/ML IV SOLN
500.0000 [IU] | Freq: Once | INTRAVENOUS | Status: AC
  Administered 2019-10-04: 500 [IU] via INTRAVENOUS

## 2019-10-04 MED ORDER — SODIUM CHLORIDE 0.9 % IV SOLN
50.0000 mg/m2 | Freq: Once | INTRAVENOUS | Status: AC
  Administered 2019-10-04: 130 mg via INTRAVENOUS
  Filled 2019-10-04: qty 6.5

## 2019-10-04 MED ORDER — PEGFILGRASTIM 6 MG/0.6ML ~~LOC~~ PSKT
6.0000 mg | PREFILLED_SYRINGE | Freq: Once | SUBCUTANEOUS | Status: AC
  Administered 2019-10-04: 6 mg via SUBCUTANEOUS
  Filled 2019-10-04: qty 0.6

## 2019-10-04 MED ORDER — HEPARIN SOD (PORK) LOCK FLUSH 100 UNIT/ML IV SOLN
INTRAVENOUS | Status: AC
  Filled 2019-10-04: qty 5

## 2019-10-04 MED ORDER — SODIUM CHLORIDE 0.9 % IV SOLN
Freq: Once | INTRAVENOUS | Status: AC
  Administered 2019-10-04: 09:00:00 via INTRAVENOUS
  Filled 2019-10-04: qty 250

## 2019-10-04 NOTE — Progress Notes (Signed)
Written instructions on Neulasta On Pro given and verbally reviewed with pt. Pt verbalizes understanding. Pt stable at discharge.

## 2019-10-05 ENCOUNTER — Ambulatory Visit: Payer: Medicare Other

## 2019-10-21 DIAGNOSIS — J9601 Acute respiratory failure with hypoxia: Secondary | ICD-10-CM | POA: Diagnosis not present

## 2019-10-21 DIAGNOSIS — I2699 Other pulmonary embolism without acute cor pulmonale: Secondary | ICD-10-CM | POA: Diagnosis not present

## 2019-10-21 DIAGNOSIS — C8590 Non-Hodgkin lymphoma, unspecified, unspecified site: Secondary | ICD-10-CM | POA: Diagnosis not present

## 2019-10-21 DIAGNOSIS — A419 Sepsis, unspecified organism: Secondary | ICD-10-CM | POA: Diagnosis not present

## 2019-10-21 DIAGNOSIS — I2694 Multiple subsegmental pulmonary emboli without acute cor pulmonale: Secondary | ICD-10-CM | POA: Diagnosis not present

## 2019-10-22 ENCOUNTER — Other Ambulatory Visit: Payer: Self-pay

## 2019-10-22 ENCOUNTER — Encounter: Payer: Self-pay | Admitting: Family Medicine

## 2019-10-22 ENCOUNTER — Ambulatory Visit (INDEPENDENT_AMBULATORY_CARE_PROVIDER_SITE_OTHER): Payer: Medicare Other | Admitting: Family Medicine

## 2019-10-22 VITALS — BP 125/76 | HR 79 | Temp 96.6°F | Wt 279.8 lb

## 2019-10-22 DIAGNOSIS — I2699 Other pulmonary embolism without acute cor pulmonale: Secondary | ICD-10-CM

## 2019-10-22 DIAGNOSIS — C8590 Non-Hodgkin lymphoma, unspecified, unspecified site: Secondary | ICD-10-CM

## 2019-10-22 MED ORDER — RIVAROXABAN 20 MG PO TABS: 20.0000 mg | ORAL_TABLET | Freq: Every day | ORAL | 3 refills | Status: DC

## 2019-10-22 NOTE — Progress Notes (Signed)
Patient pre screened for office appointment, no questions or concerns today. Patient reminded of upcoming appointment time and date. Wife supplied information for chart.

## 2019-10-22 NOTE — Patient Instructions (Addendum)
.   Please review the attached list of medications and notify my office if there are any errors.   . Please bring all of your medications to every appointment so we can make sure that our medication list is the same as yours.   . You should have a Pneumonia vaccine at some point. Will need to check with Dr. Rogue Bussing to see if this needs to wait until your treatments of finished or not.

## 2019-10-22 NOTE — Progress Notes (Signed)
Patient: Eric [name] Male    DOB: 12/05/1950   68 y.o.   MRN: [id number] Visit Date: 10/22/2019  Today's Provider: Lelon Huh, MD   Chief Complaint  Patient presents with  . Hospitalization Follow-up   Subjective:     HPI  Follow up Hospitalization  Patient was admitted to [facility] on 09/18/2019 and discharged on 09/21/2019. He was treated for Sepsis and fever, pulmonary emboli. Treatment for this included starting on Xarelto, continuous home oxygen 2L,. Patient was to follow up with his oncologist in approximately 10 days after discharge. He is followed by Dr. Rogue Bussing for lymphoma and has follow up scheduled tomorrow.  Telephone follow up was done on 09/24/2019. He reports good compliance with treatment. He reports this condition is Improved. He was having  ------------------------------------------------------------------------------------    Allergies  Allergen Reactions  . Allopurinol Rash  . Azithromycin Itching  . Losartan Potassium Hives     Current Outpatient Medications:  .  carvedilol (COREG) 6.25 MG tablet, Take 1 tablet (6.25 mg total) by mouth 2 (two) times daily., Disp: 180 tablet, Rfl: 3 .  lidocaine-prilocaine (EMLA) cream, Apply generously on the port 30-45 prior to accessing the port., Disp: 30 g, Rfl: 0 .  Multiple Vitamins-Minerals (MULTI-VITAMIN/MINERALS PO), Take 1 capsule by mouth., Disp: , Rfl:  .  Rivaroxaban 15 & 20 MG TBPK, Follow package directions: Take one 15mg  tablet by mouth twice a day. On day 20 (10/11/19), switch to one 20mg  tablet once a day. Take with food., Disp: 51 each, Rfl: 0  Review of Systems  Constitutional: Negative for appetite change, chills and fever.  Respiratory: Negative for chest tightness, shortness of breath and wheezing.   Cardiovascular: Negative for chest pain and palpitations.  Gastrointestinal: Negative for abdominal pain, nausea and vomiting.    Social History   Tobacco Use  . Smoking  status: Current Every Day Smoker    Packs/day: 1.00    Years: 20.00    Pack years: 20.00    Types: Cigars  . Smokeless tobacco: Never Used  . Tobacco comment: smoke 1/2 ppd cigaretes since 68 yo. changed to cigars at age July 2004.   Substance Use Topics  . Alcohol use: Yes    Comment: 6-7 drinks weekly      Objective:   BP 125/76 (BP Location: Right Arm, Patient Position: Sitting, Cuff Size: Large)   Pulse 79   Temp (!) 96.6 F (35.9 C) (Temporal)   Wt 279 lb 12.8 oz (126.9 kg)   SpO2 97%   BMI 37.95 kg/m  Vitals:   10/22/19 1025  BP: 125/76  Pulse: 79  Temp: (!) 96.6 F (35.9 C)  TempSrc: Temporal  SpO2: 97%  Weight: 279 lb 12.8 oz (126.9 kg)  Body mass index is 37.95 kg/m.   Physical Exam   General Appearance:    Obese male in no acute distress  Eyes:    PERRL, conjunctiva/corneas clear, EOM's intact       Lungs:     Clear to auscultation bilaterally, respirations unlabored  Heart:    Normal heart rate. Normal rhythm. No murmurs, rubs, or gallops.   MS:   All extremities are intact.   Neurologic:   Awake, alert, oriented x 3. No apparent focal neurological           defect.           Assessment & Plan    1. Bilateral pulmonary embolism (HCC) Much improved, on Xarelto  x 1 month. Anticipate 3-6 month course of treatment. Has heme/onc follow up tomorrow.  - rivaroxaban (XARELTO) 20 MG TABS tablet; Take 1 tablet (20 mg total) by mouth daily with supper.  Dispense: 30 tablet; Refill: 3  2. Lymphoma of lymph nodes ([place]) Discussed recommendations for pneumonia vaccine. Considering ongoing treatment for lymphoma will check with Dr. Rogue Bussing to see when the best time for pneumonia vaccine would be.  The entirety of the information documented in the History of Present Illness, Review of Systems and Physical Exam were personally obtained by me. Portions of this information were initially documented by Idelle Jo, CMA and reviewed by me for thoroughness and  accuracy.      Lelon Huh, MD  [facility]

## 2019-10-23 ENCOUNTER — Inpatient Hospital Stay (HOSPITAL_BASED_OUTPATIENT_CLINIC_OR_DEPARTMENT_OTHER): Payer: Medicare Other | Admitting: Internal Medicine

## 2019-10-23 ENCOUNTER — Other Ambulatory Visit: Payer: Self-pay | Admitting: *Deleted

## 2019-10-23 ENCOUNTER — Other Ambulatory Visit: Payer: Self-pay

## 2019-10-23 ENCOUNTER — Encounter: Payer: Self-pay | Admitting: Internal Medicine

## 2019-10-23 ENCOUNTER — Inpatient Hospital Stay: Payer: Medicare Other

## 2019-10-23 VITALS — BP 107/69 | HR 71 | Temp 97.0°F | Resp 18

## 2019-10-23 DIAGNOSIS — Z95828 Presence of other vascular implants and grafts: Secondary | ICD-10-CM

## 2019-10-23 DIAGNOSIS — Z5189 Encounter for other specified aftercare: Secondary | ICD-10-CM | POA: Diagnosis not present

## 2019-10-23 DIAGNOSIS — C8590 Non-Hodgkin lymphoma, unspecified, unspecified site: Secondary | ICD-10-CM | POA: Diagnosis not present

## 2019-10-23 DIAGNOSIS — Z5111 Encounter for antineoplastic chemotherapy: Secondary | ICD-10-CM | POA: Diagnosis not present

## 2019-10-23 DIAGNOSIS — Z5112 Encounter for antineoplastic immunotherapy: Secondary | ICD-10-CM | POA: Diagnosis not present

## 2019-10-23 DIAGNOSIS — C8338 Diffuse large B-cell lymphoma, lymph nodes of multiple sites: Secondary | ICD-10-CM | POA: Diagnosis not present

## 2019-10-23 DIAGNOSIS — Z86711 Personal history of pulmonary embolism: Secondary | ICD-10-CM | POA: Diagnosis not present

## 2019-10-23 DIAGNOSIS — Z7901 Long term (current) use of anticoagulants: Secondary | ICD-10-CM | POA: Diagnosis not present

## 2019-10-23 LAB — COMPREHENSIVE METABOLIC PANEL
ALT: 23 U/L (ref 0–44)
AST: 22 U/L (ref 15–41)
Albumin: 3.8 g/dL (ref 3.5–5.0)
Alkaline Phosphatase: 67 U/L (ref 38–126)
Anion gap: 6 (ref 5–15)
BUN: 17 mg/dL (ref 8–23)
CO2: 29 mmol/L (ref 22–32)
Calcium: 9.2 mg/dL (ref 8.9–10.3)
Chloride: 104 mmol/L (ref 98–111)
Creatinine, Ser: 0.72 mg/dL (ref 0.61–1.24)
GFR calc Af Amer: >60 mL/min (ref >60)
GFR calc non Af Amer: >60 mL/min (ref >60)
Glucose, Bld: 105 mg/dL — ABNORMAL HIGH (ref 70–99)
Potassium: 4.4 mmol/L (ref 3.5–5.1)
Sodium: 139 mmol/L (ref 135–145)
Total Bilirubin: 0.7 mg/dL (ref 0.3–1.2)
Total Protein: 6.3 g/dL — ABNORMAL LOW (ref 6.5–8.1)

## 2019-10-23 LAB — CBC WITH DIFFERENTIAL/PLATELET
Abs Immature Granulocytes: 0.11 10*3/uL — ABNORMAL HIGH (ref 0.00–0.07)
Basophils Absolute: 0.1 10*3/uL (ref 0.0–0.1)
Basophils Relative: 1 %
Eosinophils Absolute: 0.1 10*3/uL (ref 0.0–0.5)
Eosinophils Relative: 2 %
HCT: 37.2 % — ABNORMAL LOW (ref 39.0–52.0)
Hemoglobin: 11.9 g/dL — ABNORMAL LOW (ref 13.0–17.0)
Immature Granulocytes: 2 %
Lymphocytes Relative: 19 %
Lymphs Abs: 1.1 10*3/uL (ref 0.7–4.0)
MCH: 28.3 pg (ref 26.0–34.0)
MCHC: 32 g/dL (ref 30.0–36.0)
MCV: 88.4 fL (ref 80.0–100.0)
Monocytes Absolute: 0.7 10*3/uL (ref 0.1–1.0)
Monocytes Relative: 11 %
Neutro Abs: 3.9 10*3/uL (ref 1.7–7.7)
Neutrophils Relative %: 65 %
Platelets: 225 10*3/uL (ref 150–400)
RBC: 4.21 MIL/uL — ABNORMAL LOW (ref 4.22–5.81)
RDW: 19 % — ABNORMAL HIGH (ref 11.5–15.5)
WBC: 5.9 10*3/uL (ref 4.0–10.5)
nRBC: 0 % (ref 0.0–0.2)

## 2019-10-23 LAB — LACTATE DEHYDROGENASE: LDH: 170 U/L (ref 98–192)

## 2019-10-23 MED ORDER — VINCRISTINE SULFATE CHEMO INJECTION 1 MG/ML
2.0000 mg | Freq: Once | INTRAVENOUS | Status: AC
  Administered 2019-10-23: 2 mg via INTRAVENOUS
  Filled 2019-10-23: qty 2

## 2019-10-23 MED ORDER — SODIUM CHLORIDE 0.9 % IV SOLN
50.0000 mg/m2 | Freq: Once | INTRAVENOUS | Status: AC
  Administered 2019-10-23: 130 mg via INTRAVENOUS
  Filled 2019-10-23: qty 6.5

## 2019-10-23 MED ORDER — SODIUM CHLORIDE 0.9% FLUSH
10.0000 mL | INTRAVENOUS | Status: DC | PRN
  Administered 2019-10-23: 08:00:00 10 mL via INTRAVENOUS
  Filled 2019-10-23: qty 10

## 2019-10-23 MED ORDER — ACETAMINOPHEN 325 MG PO TABS
650.0000 mg | ORAL_TABLET | Freq: Once | ORAL | Status: AC
  Administered 2019-10-23: 650 mg via ORAL
  Filled 2019-10-23: qty 2

## 2019-10-23 MED ORDER — DIPHENHYDRAMINE HCL 25 MG PO CAPS
50.0000 mg | ORAL_CAPSULE | Freq: Once | ORAL | Status: AC
  Administered 2019-10-23: 50 mg via ORAL
  Filled 2019-10-23: qty 2

## 2019-10-23 MED ORDER — PALONOSETRON HCL INJECTION 0.25 MG/5ML
0.2500 mg | Freq: Once | INTRAVENOUS | Status: AC
  Administered 2019-10-23: 0.25 mg via INTRAVENOUS
  Filled 2019-10-23: qty 5

## 2019-10-23 MED ORDER — SODIUM CHLORIDE 0.9 % IV SOLN
Freq: Once | INTRAVENOUS | Status: AC
  Filled 2019-10-23: qty 250

## 2019-10-23 MED ORDER — HEPARIN SOD (PORK) LOCK FLUSH 100 UNIT/ML IV SOLN
500.0000 [IU] | Freq: Once | INTRAVENOUS | Status: AC
  Administered 2019-10-23: 500 [IU] via INTRAVENOUS
  Filled 2019-10-23: qty 5

## 2019-10-23 MED ORDER — DIPHENHYDRAMINE HCL 25 MG PO CAPS
ORAL_CAPSULE | ORAL | Status: AC
  Filled 2019-10-23: qty 1

## 2019-10-23 MED ORDER — HEPARIN SOD (PORK) LOCK FLUSH 100 UNIT/ML IV SOLN
500.0000 [IU] | Freq: Once | INTRAVENOUS | Status: DC | PRN
  Filled 2019-10-23: qty 5

## 2019-10-23 MED ORDER — SODIUM CHLORIDE 0.9 % IV SOLN
375.0000 mg/m2 | Freq: Once | INTRAVENOUS | Status: AC
  Administered 2019-10-23: 900 mg via INTRAVENOUS
  Filled 2019-10-23: qty 50

## 2019-10-23 MED ORDER — SODIUM CHLORIDE 0.9 % IV SOLN
2000.0000 mg | Freq: Once | INTRAVENOUS | Status: AC
  Administered 2019-10-23: 2000 mg via INTRAVENOUS
  Filled 2019-10-23: qty 100

## 2019-10-23 MED ORDER — SODIUM CHLORIDE 0.9 % IV SOLN
Freq: Once | INTRAVENOUS | Status: AC
  Filled 2019-10-23: qty 5

## 2019-10-23 NOTE — Assessment & Plan Note (Signed)
#  Diffuse large B-cell lymphoma-ABC subtype;  Based on PET scan; stage IV [involvement of the spleen]; s/p R-CEOP s/p #3.    # proceed with R-CEOP #4 today; Labs today reviewed;  acceptable for treatment today.  Will get PET scan after this cycle.  # Bil acute PE- [nov 2020]- stable; on xarelto 20 mg/day .   # Borderline LOW EF of 45-50% [see below]- on coreg BID. STABLE.    # DISPOSITION:  # Proceed with chemo- this week as planned.  # in 3 weeks- MD-labs-cbc/Cmp/ldh- RCEOP; day-2 etop/d-3-Etop; d-4 udenyca; PET prior- Dr.B

## 2019-10-23 NOTE — Progress Notes (Signed)
[place] NOTE  Patient Care Team: [name], MD as PCP - General (Family Medicine) [name] [name], MD (General Surgery)  CHIEF COMPLAINTS/PURPOSE OF CONSULTATION: Diffuse large B-cell lymphoma  #  Oncology History Overview Note  #June 29, 2019-CT- bulky bilateral neck adenopathy left more than right; September 2020-Diffuse B-cell lymphoma; ABC subtype; NEGATIVE FISH studies.  PET scan -bulky adenopathy involving neck /lymphadenopathy mediastinum /abdomen/pelvis inguinal lymph nodes; splenic involvement.  Question bone.  Stage IV [spleen involvement; no bone marrow biopsy].  BCL-2: Positive /BCL 6: Positive /MUM-1: Positive /CD10: Negative 'Ki-67: High proliferation index (90%) /CD30: Negative /Cyclin D1: Negative C-MYC: Equivocal (20% staining seen on limited and fragmented tissue) /EBER: Negative   # Ritux-Pred- 9/11; 9/18- R-COP [held adria- sec to EF 45-50%]  #November 2020-bilateral PE; on Eliquis  # Sep 2020- Borderline low EF/Left ventricular hypokinesis [Dr.Gollan]-s October 9th stress test-nonischemic;   # DIAGNOSIS: DLBCL  STAGE:   4      ;GOALS: Cure  CURRENT/MOST RECENT THERAPY : R-COP/-R-CEOP. [C]    Lymphoma of lymph nodes ([place])  07/03/2019 Initial Diagnosis   Lymphoma of lymph nodes ([place])   07/13/2019 -  Chemotherapy   The patient had palonosetron (ALOXI) injection 0.25 mg, 0.25 mg, Intravenous,  Once, 5 of 7 cycles Administration: 0.25 mg (07/20/2019), 0.25 mg (08/13/2019), 0.25 mg (09/03/2019), 0.25 mg (10/02/2019) pegfilgrastim (NEULASTA) injection 6 mg, 6 mg, Subcutaneous, Once, 1 of 1 cycle pegfilgrastim (NEULASTA ONPRO KIT) injection 6 mg, 6 mg, Subcutaneous, Once, 1 of 1 cycle Administration: 6 mg (10/04/2019) pegfilgrastim-cbqv (UDENYCA) injection 6 mg, 6 mg, Subcutaneous, Once, 3 of 5 cycles Administration: 6 mg (07/23/2019), 6 mg (08/16/2019), 6 mg (09/06/2019) vinCRIStine (ONCOVIN) 2 mg in sodium chloride 0.9 % 50 mL chemo  infusion, 2 mg, Intravenous,  Once, 5 of 7 cycles Administration: 2 mg (07/20/2019), 2 mg (08/13/2019), 2 mg (09/03/2019), 2 mg (10/02/2019) cyclophosphamide (CYTOXAN) 1,900 mg in sodium chloride 0.9 % 250 mL chemo infusion, 750 mg/m2 = 1,900 mg, Intravenous,  Once, 5 of 7 cycles Administration: 1,900 mg (07/20/2019), 1,900 mg (08/13/2019), 1,900 mg (09/03/2019), 2,000 mg (10/02/2019) etoposide (VEPESID) 250 mg in sodium chloride 0.9 % 1,000 mL chemo infusion, 100 mg/m2 = 250 mg, Intravenous,  Once, 4 of 6 cycles Dose modification: 50 mg/m2 (original dose 100 mg/m2, Cycle 3, Reason: Provider Judgment) Administration: 130 mg (08/14/2019), 130 mg (08/15/2019), 130 mg (09/03/2019), 130 mg (09/04/2019), 130 mg (09/05/2019), 130 mg (10/03/2019), 130 mg (10/04/2019), 130 mg (10/02/2019) dexamethasone (DECADRON) 12 mg in sodium chloride 0.9 % 145 mL IVPB, , Intravenous,  Once, 6 of 8 cycles Administration:  (07/13/2019),  (07/20/2019),  (08/13/2019),  (09/03/2019),  (10/02/2019) riTUXimab-pvvr (RUXIENCE) 900 mg in sodium chloride 0.9 % 250 mL (2.6471 mg/mL) infusion, 375 mg/m2 = 900 mg (100 % of original dose 375 mg/m2), Intravenous,  Once, 1 of 1 cycle Dose modification: 375 mg/m2 (original dose 375 mg/m2, Cycle 1) Administration: 900 mg (07/13/2019)  for chemotherapy treatment.      HISTORY OF PRESENTING ILLNESS:  Eric [name] 68 y.o.  male with recently due for lab B-cell lymphoma is currently status post R-CEOP #3 approximately 3 weeks ago weeks ago is here for follow-up/proceed with chemotherapy.  Patient has not had any recent hospitalizations.  Admits to good appetite.  No nausea no vomiting.  Chest pain for shortness of breath improved.  No tingling or numbness Review of Systems  Constitutional: Negative for chills, diaphoresis and fever.  HENT: Negative for nosebleeds and  sore throat.   Eyes: Negative for double vision.  Respiratory: Negative for hemoptysis, sputum production and wheezing.    Cardiovascular: Negative for chest pain, palpitations, orthopnea and leg swelling.  Gastrointestinal: Negative for abdominal pain, blood in stool, constipation, diarrhea, heartburn, melena, nausea and vomiting.  Genitourinary: Negative for dysuria, frequency and urgency.  Musculoskeletal: Negative for back pain and joint pain.  Skin: Negative.  Negative for itching and rash.  Neurological: Negative for dizziness, tingling, focal weakness, weakness and headaches.  Endo/Heme/Allergies: Does not bruise/bleed easily.  Psychiatric/Behavioral: Negative for depression. The patient does not have insomnia.      MEDICAL HISTORY:  Past Medical History:  Diagnosis Date  . Colon polyps   . Pancreatitis   . Rectal bleeding 02/24/14  . Sepsis ([place]) 09/18/2019    SURGICAL HISTORY: Past Surgical History:  Procedure Laterality Date  . CHOLECYSTECTOMY    . COLONOSCOPY  02/28/14  . IR IMAGING GUIDED PORT INSERTION  07/18/2019  . POLYPECTOMY     colon polyp removed  . TONSILLECTOMY      SOCIAL HISTORY: Social History   Socioeconomic History  . Marital status: Married    Spouse name: Not on file  . Number of children: Not on file  . Years of education: Not on file  . Highest education level: Not on file  Occupational History    Comment: Retired from [employer]  . Smoking status: Current Every Day Smoker    Packs/day: 1.00    Years: 20.00    Pack years: 20.00    Types: Cigars  . Smokeless tobacco: Never Used  . Tobacco comment: smoke 1/2 ppd cigaretes since 68 yo. changed to cigars at age July 2004.   Substance and Sexual Activity  . Alcohol use: Yes    Comment: 6-7 drinks weekly  . Drug use: No  . Sexual activity: Not on file  Other Topics Concern  . Not on file  Social History Narrative   Lives at home with wife; in [place]. retd from [employer] smoker; drinks liquor. Children - grown up.    Social Determinants of Health   Financial Resource Strain:   .  Difficulty of Paying Living Expenses: Not on file  Food Insecurity:   . Worried About Charity fundraiser in the Last Year: Not on file  . Ran Out of Food in the Last Year: Not on file  Transportation Needs:   . Lack of Transportation (Medical): Not on file  . Lack of Transportation (Non-Medical): Not on file  Physical Activity:   . Days of Exercise per Week: Not on file  . Minutes of Exercise per Session: Not on file  Stress:   . Feeling of Stress : Not on file  Social Connections:   . Frequency of Communication with Friends and Family: Not on file  . Frequency of Social Gatherings with Friends and Family: Not on file  . Attends Religious Services: Not on file  . Active Member of Clubs or Organizations: Not on file  . Attends Archivist Meetings: Not on file  . Marital Status: Not on file  Intimate Partner Violence:   . Fear of Current or Ex-Partner: Not on file  . Emotionally Abused: Not on file  . Physically Abused: Not on file  . Sexually Abused: Not on file    FAMILY HISTORY: Family History  Adopted: Yes    ALLERGIES:  is allergic to allopurinol; azithromycin; and losartan potassium.  MEDICATIONS:  Current Outpatient  Medications  Medication Sig Dispense Refill  . carvedilol (COREG) 6.25 MG tablet Take 1 tablet (6.25 mg total) by mouth 2 (two) times daily. 180 tablet 3  . lidocaine-prilocaine (EMLA) cream Apply generously on the port 30-45 prior to accessing the port. 30 g 0  . Multiple Vitamins-Minerals (MULTI-VITAMIN/MINERALS PO) Take 1 capsule by mouth.    . rivaroxaban (XARELTO) 20 MG TABS tablet Take 1 tablet (20 mg total) by mouth daily with supper. 30 tablet 3   No current facility-administered medications for this visit.   Facility-Administered Medications Ordered in Other Visits  Medication Dose Route Frequency Provider Last Rate Last Admin  . cyclophosphamide (CYTOXAN) 2,000 mg in sodium chloride 0.9 % 500 mL chemo infusion  2,000 mg Intravenous  Once Charlaine Dalton R, MD      . etoposide (VEPESID) 130 mg in sodium chloride 0.9 % 500 mL chemo infusion  50 mg/m2 (Treatment Plan Recorded) Intravenous Once Cammie Sickle, MD      . fosaprepitant (EMEND) 150 mg, dexamethasone (DECADRON) 12 mg in sodium chloride 0.9 % 145 mL IVPB   Intravenous Once Cammie Sickle, MD 454 mL/hr at 10/23/19 0935 New Bag at 10/23/19 0935  . heparin lock flush 100 unit/mL  500 Units Intravenous Once Charlaine Dalton R, MD      . heparin lock flush 100 unit/mL  500 Units Intracatheter Once PRN Cammie Sickle, MD      . riTUXimab-pvvr (RUXIENCE) 900 mg in sodium chloride 0.9 % 160 mL infusion  375 mg/m2 (Treatment Plan Recorded) Intravenous Once Charlaine Dalton R, MD      . sodium chloride flush (NS) 0.9 % injection 10 mL  10 mL Intravenous PRN Cammie Sickle, MD   10 mL at 10/23/19 0825  . vinCRIStine (ONCOVIN) 2 mg in sodium chloride 0.9 % 50 mL chemo infusion  2 mg Intravenous Once Charlaine Dalton R, MD          .  PHYSICAL EXAMINATION: ECOG PERFORMANCE STATUS: 1 - Symptomatic but completely ambulatory  Vitals:   10/23/19 0834  BP: 126/83  Pulse: 67  Temp: (!) 94.5 F (34.7 C)   Filed Weights   10/23/19 0834  Weight: 276 lb (125.2 kg)    Physical Exam  Constitutional: He is oriented to person, place, and time and well-developed, well-nourished, and in no distress.  HENT:  Head: Normocephalic and atraumatic.  Mouth/Throat: Oropharynx is clear and moist. No oropharyngeal exudate.  Eyes: Pupils are equal, round, and reactive to light.  Neck:  Resolved neck/axillary adenopathy.  Cardiovascular: Normal rate and regular rhythm.  Pulmonary/Chest: Effort normal and breath sounds normal. No respiratory distress. He has no wheezes.  Abdominal: Soft. Bowel sounds are normal. He exhibits no distension and no mass. There is no abdominal tenderness. There is no rebound and no guarding.  Musculoskeletal:         General: No tenderness or edema. Normal range of motion.     Cervical back: Normal range of motion and neck supple.  Neurological: He is alert and oriented to person, place, and time.  Skin: Skin is warm.  Psychiatric: Affect normal.   LABORATORY DATA:  I have reviewed the data as listed Lab Results  Component Value Date   WBC 5.9 10/23/2019   HGB 11.9 (L) 10/23/2019   HCT 37.2 (L) 10/23/2019   MCV 88.4 10/23/2019   PLT 225 10/23/2019   Recent Labs    09/20/19 0201 09/21/19 0455 10/02/19 0834 10/23/19 0825  NA  138 140 141 139  K 3.8 3.6 4.2 4.4  CL 108 110 106 104  CO2 _0 GLUCOSE 105* 101* 103* 105*  BUN _1 CREATININE 0.81 0.83 0.85 0.72  CALCIUM 8.3* 8.5* 9.4 9.2  GFRNONAA >60 >60 >60 >60  GFRAA >60 >60 >60 >60  PROT 5.7*  --  6.7 6.3*  ALBUMIN 2.8*  --  3.6 3.8  AST 23  --  23 22  ALT 29  --  23 23  ALKPHOS 65  --  66 67  BILITOT 0.9  --  0.6 0.7    RADIOGRAPHIC STUDIES: I have personally reviewed the radiological images as listed and agreed with the findings in the report. No results found.  ASSESSMENT & PLAN:   Lymphoma of lymph nodes (HCC) #Diffuse large B-cell lymphoma-ABC subtype;  Based on PET scan; stage IV [involvement of the spleen]; s/p R-CEOP s/p #3.    # proceed with R-CEOP #4 today; Labs today reviewed;  acceptable for treatment today.  Will get PET scan after this cycle.  # Bil acute PE- [nov 2020]- stable; on xarelto 20 mg/day .   # Borderline LOW EF of 45-50% [see below]- on coreg BID. STABLE.    # DISPOSITION:  # Proceed with chemo- this week as planned.  # in 3 weeks- MD-labs-cbc/Cmp/ldh- RCEOP; day-2 etop/d-3-Etop; d-4 udenyca; PET prior- Dr.B    All questions were answered. The patient knows to call the clinic with any problems, questions or concerns.    Cammie Sickle, MD 10/23/2019 9:41 AM

## 2019-10-23 NOTE — Progress Notes (Signed)
Blood return noted before and after Vincristine infusion. Pt tolerated infusion well. Pt denies any concerns or complaints at this time. No s/s of distress noted. Pt and VS stable at discharge.

## 2019-10-24 ENCOUNTER — Other Ambulatory Visit: Payer: Self-pay

## 2019-10-24 ENCOUNTER — Other Ambulatory Visit: Payer: Self-pay | Admitting: Internal Medicine

## 2019-10-24 ENCOUNTER — Inpatient Hospital Stay: Payer: Medicare Other

## 2019-10-24 VITALS — BP 119/74 | HR 70 | Temp 96.6°F | Resp 20

## 2019-10-24 DIAGNOSIS — Z86711 Personal history of pulmonary embolism: Secondary | ICD-10-CM | POA: Diagnosis not present

## 2019-10-24 DIAGNOSIS — Z5111 Encounter for antineoplastic chemotherapy: Secondary | ICD-10-CM | POA: Diagnosis not present

## 2019-10-24 DIAGNOSIS — C8338 Diffuse large B-cell lymphoma, lymph nodes of multiple sites: Secondary | ICD-10-CM | POA: Diagnosis not present

## 2019-10-24 DIAGNOSIS — Z5189 Encounter for other specified aftercare: Secondary | ICD-10-CM | POA: Diagnosis not present

## 2019-10-24 DIAGNOSIS — Z5112 Encounter for antineoplastic immunotherapy: Secondary | ICD-10-CM | POA: Diagnosis not present

## 2019-10-24 DIAGNOSIS — Z7901 Long term (current) use of anticoagulants: Secondary | ICD-10-CM | POA: Diagnosis not present

## 2019-10-24 DIAGNOSIS — C8590 Non-Hodgkin lymphoma, unspecified, unspecified site: Secondary | ICD-10-CM

## 2019-10-24 MED ORDER — HEPARIN SOD (PORK) LOCK FLUSH 100 UNIT/ML IV SOLN
500.0000 [IU] | Freq: Once | INTRAVENOUS | Status: AC
  Administered 2019-10-24: 500 [IU] via INTRAVENOUS
  Filled 2019-10-24: qty 5

## 2019-10-24 MED ORDER — SODIUM CHLORIDE 0.9 % IV SOLN
Freq: Once | INTRAVENOUS | Status: AC
  Filled 2019-10-24: qty 250

## 2019-10-24 MED ORDER — SODIUM CHLORIDE 0.9% FLUSH
10.0000 mL | INTRAVENOUS | Status: DC | PRN
  Administered 2019-10-24: 09:00:00 10 mL via INTRAVENOUS
  Filled 2019-10-24: qty 10

## 2019-10-24 MED ORDER — HEPARIN SOD (PORK) LOCK FLUSH 100 UNIT/ML IV SOLN
INTRAVENOUS | Status: AC
  Filled 2019-10-24: qty 5

## 2019-10-24 MED ORDER — SODIUM CHLORIDE 0.9 % IV SOLN
50.0000 mg/m2 | Freq: Once | INTRAVENOUS | Status: AC
  Administered 2019-10-24: 130 mg via INTRAVENOUS
  Filled 2019-10-24: qty 6.5

## 2019-10-24 MED ORDER — DEXAMETHASONE SODIUM PHOSPHATE 10 MG/ML IJ SOLN
10.0000 mg | Freq: Once | INTRAMUSCULAR | Status: AC
  Administered 2019-10-24: 10 mg via INTRAVENOUS
  Filled 2019-10-24: qty 1

## 2019-10-25 ENCOUNTER — Other Ambulatory Visit: Payer: Self-pay

## 2019-10-25 ENCOUNTER — Inpatient Hospital Stay: Payer: Medicare Other

## 2019-10-25 VITALS — BP 124/65 | HR 77 | Temp 96.0°F | Resp 20

## 2019-10-25 DIAGNOSIS — Z7901 Long term (current) use of anticoagulants: Secondary | ICD-10-CM | POA: Diagnosis not present

## 2019-10-25 DIAGNOSIS — Z5111 Encounter for antineoplastic chemotherapy: Secondary | ICD-10-CM | POA: Diagnosis not present

## 2019-10-25 DIAGNOSIS — C8338 Diffuse large B-cell lymphoma, lymph nodes of multiple sites: Secondary | ICD-10-CM | POA: Diagnosis not present

## 2019-10-25 DIAGNOSIS — Z86711 Personal history of pulmonary embolism: Secondary | ICD-10-CM | POA: Diagnosis not present

## 2019-10-25 DIAGNOSIS — Z5112 Encounter for antineoplastic immunotherapy: Secondary | ICD-10-CM | POA: Diagnosis not present

## 2019-10-25 DIAGNOSIS — Z5189 Encounter for other specified aftercare: Secondary | ICD-10-CM | POA: Diagnosis not present

## 2019-10-25 DIAGNOSIS — C8590 Non-Hodgkin lymphoma, unspecified, unspecified site: Secondary | ICD-10-CM

## 2019-10-25 MED ORDER — SODIUM CHLORIDE 0.9 % IV SOLN
10.0000 mg | Freq: Once | INTRAVENOUS | Status: AC
  Administered 2019-10-25: 10 mg via INTRAVENOUS
  Filled 2019-10-25: qty 1

## 2019-10-25 MED ORDER — SODIUM CHLORIDE 0.9% FLUSH
10.0000 mL | INTRAVENOUS | Status: DC | PRN
  Administered 2019-10-25: 10 mL via INTRAVENOUS
  Filled 2019-10-25: qty 10

## 2019-10-25 MED ORDER — PEGFILGRASTIM 6 MG/0.6ML ~~LOC~~ PSKT
6.0000 mg | PREFILLED_SYRINGE | Freq: Once | SUBCUTANEOUS | Status: AC
  Administered 2019-10-25: 09:00:00 6 mg via SUBCUTANEOUS
  Filled 2019-10-25: qty 0.6

## 2019-10-25 MED ORDER — SODIUM CHLORIDE 0.9 % IV SOLN
Freq: Once | INTRAVENOUS | Status: AC
  Filled 2019-10-25: qty 250

## 2019-10-25 MED ORDER — HEPARIN SOD (PORK) LOCK FLUSH 100 UNIT/ML IV SOLN
INTRAVENOUS | Status: AC
  Filled 2019-10-25: qty 5

## 2019-10-25 MED ORDER — SODIUM CHLORIDE 0.9 % IV SOLN
50.0000 mg/m2 | Freq: Once | INTRAVENOUS | Status: AC
  Administered 2019-10-25: 130 mg via INTRAVENOUS
  Filled 2019-10-25: qty 6.5

## 2019-10-25 MED ORDER — HEPARIN SOD (PORK) LOCK FLUSH 100 UNIT/ML IV SOLN
500.0000 [IU] | Freq: Once | INTRAVENOUS | Status: AC
  Administered 2019-10-25: 500 [IU] via INTRAVENOUS
  Filled 2019-10-25: qty 5

## 2019-10-25 MED ORDER — DEXAMETHASONE SODIUM PHOSPHATE 10 MG/ML IJ SOLN: 10.0000 mg | Freq: Once | INTRAMUSCULAR | Status: DC

## 2019-10-31 ENCOUNTER — Other Ambulatory Visit: Payer: Self-pay

## 2019-10-31 NOTE — Patient Outreach (Signed)
[facility]) Care Management  10/31/2019  Curties Sassaman 1951-05-31 [id number]   Medication Adherence call to Mr. Schad Melka Hippa Identifiers Verify spoke with patient he is past due on Losartan 50 mg,patient explain he is no longer taking this medication,patient was having side effets,doctor took him. Mr. Riesen is showing past due under [place].   [facility] Management Direct Dial [phone]  Fax [phone] [email or web address]

## 2019-11-08 ENCOUNTER — Other Ambulatory Visit: Payer: Self-pay | Admitting: Licensed Clinical Social Worker

## 2019-11-08 ENCOUNTER — Ambulatory Visit: Payer: Medicare Other

## 2019-11-08 ENCOUNTER — Encounter: Payer: Self-pay | Admitting: Internal Medicine

## 2019-11-08 DIAGNOSIS — C8590 Non-Hodgkin lymphoma, unspecified, unspecified site: Secondary | ICD-10-CM

## 2019-11-12 NOTE — Progress Notes (Signed)
Patient is coming in for appointment and would like to go back to work part time and will need a note for this.

## 2019-11-13 ENCOUNTER — Inpatient Hospital Stay: Payer: Medicare Other | Attending: Internal Medicine

## 2019-11-13 ENCOUNTER — Inpatient Hospital Stay: Payer: Medicare Other

## 2019-11-13 ENCOUNTER — Other Ambulatory Visit: Payer: Self-pay

## 2019-11-13 ENCOUNTER — Inpatient Hospital Stay (HOSPITAL_BASED_OUTPATIENT_CLINIC_OR_DEPARTMENT_OTHER): Payer: Medicare Other | Admitting: Internal Medicine

## 2019-11-13 VITALS — BP 112/59 | HR 67 | Temp 97.9°F | Resp 19

## 2019-11-13 DIAGNOSIS — Z5112 Encounter for antineoplastic immunotherapy: Secondary | ICD-10-CM | POA: Insufficient documentation

## 2019-11-13 DIAGNOSIS — Z5189 Encounter for other specified aftercare: Secondary | ICD-10-CM | POA: Diagnosis not present

## 2019-11-13 DIAGNOSIS — C8590 Non-Hodgkin lymphoma, unspecified, unspecified site: Secondary | ICD-10-CM | POA: Diagnosis not present

## 2019-11-13 DIAGNOSIS — Z7901 Long term (current) use of anticoagulants: Secondary | ICD-10-CM | POA: Insufficient documentation

## 2019-11-13 DIAGNOSIS — C8338 Diffuse large B-cell lymphoma, lymph nodes of multiple sites: Secondary | ICD-10-CM | POA: Insufficient documentation

## 2019-11-13 DIAGNOSIS — Z86711 Personal history of pulmonary embolism: Secondary | ICD-10-CM | POA: Diagnosis not present

## 2019-11-13 DIAGNOSIS — Z5111 Encounter for antineoplastic chemotherapy: Secondary | ICD-10-CM | POA: Diagnosis not present

## 2019-11-13 LAB — COMPREHENSIVE METABOLIC PANEL
ALT: 22 U/L (ref 0–44)
AST: 21 U/L (ref 15–41)
Albumin: 3.8 g/dL (ref 3.5–5.0)
Alkaline Phosphatase: 70 U/L (ref 38–126)
Anion gap: 7 (ref 5–15)
BUN: 15 mg/dL (ref 8–23)
CO2: 26 mmol/L (ref 22–32)
Calcium: 9.1 mg/dL (ref 8.9–10.3)
Chloride: 106 mmol/L (ref 98–111)
Creatinine, Ser: 0.72 mg/dL (ref 0.61–1.24)
GFR calc Af Amer: >60 mL/min (ref >60)
GFR calc non Af Amer: >60 mL/min (ref >60)
Glucose, Bld: 103 mg/dL — ABNORMAL HIGH (ref 70–99)
Potassium: 4 mmol/L (ref 3.5–5.1)
Sodium: 139 mmol/L (ref 135–145)
Total Bilirubin: 0.7 mg/dL (ref 0.3–1.2)
Total Protein: 6.5 g/dL (ref 6.5–8.1)

## 2019-11-13 LAB — CBC WITH DIFFERENTIAL/PLATELET
Abs Immature Granulocytes: 0.23 10*3/uL — ABNORMAL HIGH (ref 0.00–0.07)
Basophils Absolute: 0.1 10*3/uL (ref 0.0–0.1)
Basophils Relative: 1 %
Eosinophils Absolute: 0.1 10*3/uL (ref 0.0–0.5)
Eosinophils Relative: 2 %
HCT: 36.8 % — ABNORMAL LOW (ref 39.0–52.0)
Hemoglobin: 11.6 g/dL — ABNORMAL LOW (ref 13.0–17.0)
Immature Granulocytes: 4 %
Lymphocytes Relative: 20 %
Lymphs Abs: 1.3 10*3/uL (ref 0.7–4.0)
MCH: 28.1 pg (ref 26.0–34.0)
MCHC: 31.5 g/dL (ref 30.0–36.0)
MCV: 89.1 fL (ref 80.0–100.0)
Monocytes Absolute: 0.7 10*3/uL (ref 0.1–1.0)
Monocytes Relative: 10 %
Neutro Abs: 4.3 10*3/uL (ref 1.7–7.7)
Neutrophils Relative %: 63 %
Platelets: 229 10*3/uL (ref 150–400)
RBC: 4.13 MIL/uL — ABNORMAL LOW (ref 4.22–5.81)
RDW: 19.9 % — ABNORMAL HIGH (ref 11.5–15.5)
WBC: 6.7 10*3/uL (ref 4.0–10.5)
nRBC: 0 % (ref 0.0–0.2)

## 2019-11-13 LAB — LACTATE DEHYDROGENASE: LDH: 181 U/L (ref 98–192)

## 2019-11-13 MED ORDER — SODIUM CHLORIDE 0.9 % IV SOLN
793.0000 mg/m2 | Freq: Once | INTRAVENOUS | Status: AC
  Administered 2019-11-13: 10:00:00 2000 mg via INTRAVENOUS
  Filled 2019-11-13: qty 100

## 2019-11-13 MED ORDER — SODIUM CHLORIDE 0.9 % IV SOLN
375.0000 mg/m2 | Freq: Once | INTRAVENOUS | Status: AC
  Administered 2019-11-13: 900 mg via INTRAVENOUS
  Filled 2019-11-13: qty 50

## 2019-11-13 MED ORDER — SODIUM CHLORIDE 0.9 % IV SOLN
Freq: Once | INTRAVENOUS | Status: AC
  Filled 2019-11-13: qty 250

## 2019-11-13 MED ORDER — DIPHENHYDRAMINE HCL 25 MG PO CAPS
50.0000 mg | ORAL_CAPSULE | Freq: Once | ORAL | Status: AC
  Administered 2019-11-13: 50 mg via ORAL
  Filled 2019-11-13: qty 2

## 2019-11-13 MED ORDER — VINCRISTINE SULFATE CHEMO INJECTION 1 MG/ML
2.0000 mg | Freq: Once | INTRAVENOUS | Status: AC
  Administered 2019-11-13: 2 mg via INTRAVENOUS
  Filled 2019-11-13: qty 2

## 2019-11-13 MED ORDER — ACETAMINOPHEN 325 MG PO TABS
650.0000 mg | ORAL_TABLET | Freq: Once | ORAL | Status: AC
  Administered 2019-11-13: 650 mg via ORAL
  Filled 2019-11-13: qty 2

## 2019-11-13 MED ORDER — SODIUM CHLORIDE 0.9 % IV SOLN
50.0000 mg/m2 | Freq: Once | INTRAVENOUS | Status: AC
  Administered 2019-11-13: 11:00:00 130 mg via INTRAVENOUS
  Filled 2019-11-13: qty 6.5

## 2019-11-13 MED ORDER — PALONOSETRON HCL INJECTION 0.25 MG/5ML
0.2500 mg | Freq: Once | INTRAVENOUS | Status: AC
  Administered 2019-11-13: 0.25 mg via INTRAVENOUS
  Filled 2019-11-13: qty 5

## 2019-11-13 MED ORDER — HEPARIN SOD (PORK) LOCK FLUSH 100 UNIT/ML IV SOLN
500.0000 [IU] | Freq: Once | INTRAVENOUS | Status: AC | PRN
  Administered 2019-11-13: 500 [IU]
  Filled 2019-11-13: qty 5

## 2019-11-13 MED ORDER — SODIUM CHLORIDE 0.9 % IV SOLN
Freq: Once | INTRAVENOUS | Status: AC
  Filled 2019-11-13: qty 5

## 2019-11-13 NOTE — Progress Notes (Signed)
Blood return noted before and after Vincristine infusion.  

## 2019-11-13 NOTE — Assessment & Plan Note (Addendum)
#  Diffuse large B-cell lymphoma-ABC subtype;  Based on PET scan; stage IV [involvement of the spleen]; s/p R-CEOP s/p #4.   # proceed with R-CEOP #5 today; Labs today reviewed;  acceptable for treatment today. interim PET scan tomorrow sec to insurance.   # Bil acute PE- [nov 2020]-  STABLE.  on xarelto 20 mg/day .   # Borderline LOW EF of 45-50% [see below]- on coreg BID. STABLE.    # I discussed regarding Covid precautions/and also discussed proceeding with Covid vaccination when available.  Discussed that unfortunately the data safety and efficacy of vaccination is unclear especially in patients with immunocompromised state.  However, I think the benefits of the vaccination outweigh the potential risks. 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   # DISPOSITION:  # Proceed with chemo- this week as planned.  # in 3 weeks- MD-labs-cbc/Cmp/ldh- RCEOP; day-2 etop/d-3-Etop; d-4 udenyca- Dr.B

## 2019-11-13 NOTE — Progress Notes (Signed)
[facility] NOTE  Patient Care Team: [name], MD as PCP - General (Family Medicine) [name] [name], MD (General Surgery)  CHIEF COMPLAINTS/PURPOSE OF CONSULTATION: Diffuse large B-cell lymphoma  #  Oncology History Overview Note  #June 29, 2019-CT- bulky bilateral neck adenopathy left more than right; September 2020-Diffuse B-cell lymphoma; ABC subtype; NEGATIVE FISH studies.  PET scan -bulky adenopathy involving neck /lymphadenopathy mediastinum /abdomen/pelvis inguinal lymph nodes; splenic involvement.  Question bone.  Stage IV [spleen involvement; no bone marrow biopsy].  BCL-2: Positive /BCL 6: Positive /MUM-1: Positive /CD10: Negative 'Ki-67: High proliferation index (90%) /CD30: Negative /Cyclin D1: Negative C-MYC: Equivocal (20% staining seen on limited and fragmented tissue) /EBER: Negative   # Ritux-Pred- 9/11; 9/18- R-COP [held adria- sec to EF 45-50%]  #November 2020-bilateral PE; on Eliquis  # Sep 2020- Borderline low EF/Left ventricular hypokinesis [Dr.Gollan]-s October 9th stress test-nonischemic;   # DIAGNOSIS: DLBCL  STAGE:   4      ;GOALS: Cure  CURRENT/MOST RECENT THERAPY : R-COP/-R-CEOP. [C]    Lymphoma of lymph nodes ([place])  07/03/2019 Initial Diagnosis   Lymphoma of lymph nodes ([place])   07/13/2019 -  Chemotherapy   The patient had palonosetron (ALOXI) injection 0.25 mg, 0.25 mg, Intravenous,  Once, 6 of 7 cycles Administration: 0.25 mg (07/20/2019), 0.25 mg (08/13/2019), 0.25 mg (09/03/2019), 0.25 mg (10/02/2019), 0.25 mg (10/23/2019) pegfilgrastim (NEULASTA ONPRO KIT) injection 6 mg, 6 mg, Subcutaneous, Once, 2 of 2 cycles Administration: 6 mg (10/04/2019), 6 mg (10/25/2019) pegfilgrastim-cbqv (UDENYCA) injection 6 mg, 6 mg, Subcutaneous, Once, 4 of 5 cycles Administration: 6 mg (07/23/2019), 6 mg (08/16/2019), 6 mg (09/06/2019) vinCRIStine (ONCOVIN) 2 mg in sodium chloride 0.9 % 50 mL chemo infusion, 2 mg, Intravenous,  Once, 6 of  7 cycles Administration: 2 mg (07/20/2019), 2 mg (08/13/2019), 2 mg (09/03/2019), 2 mg (10/02/2019), 2 mg (10/23/2019) cyclophosphamide (CYTOXAN) 1,900 mg in sodium chloride 0.9 % 250 mL chemo infusion, 750 mg/m2 = 1,900 mg, Intravenous,  Once, 6 of 7 cycles Administration: 1,900 mg (07/20/2019), 1,900 mg (08/13/2019), 1,900 mg (09/03/2019), 2,000 mg (10/02/2019), 2,000 mg (10/23/2019) etoposide (VEPESID) 250 mg in sodium chloride 0.9 % 1,000 mL chemo infusion, 100 mg/m2 = 250 mg, Intravenous,  Once, 5 of 6 cycles Dose modification: 50 mg/m2 (original dose 100 mg/m2, Cycle 3, Reason: Provider Judgment) Administration: 130 mg (08/14/2019), 130 mg (08/15/2019), 130 mg (09/03/2019), 130 mg (09/04/2019), 130 mg (09/05/2019), 130 mg (10/03/2019), 130 mg (10/04/2019), 130 mg (10/24/2019), 130 mg (10/25/2019), 130 mg (10/02/2019), 130 mg (10/23/2019) dexamethasone (DECADRON) 12 mg in sodium chloride 0.9 % 145 mL IVPB, , Intravenous,  Once, 7 of 8 cycles Administration:  (07/13/2019),  (07/20/2019),  (08/13/2019),  (09/03/2019),  (10/02/2019),  (10/23/2019) riTUXimab-pvvr (RUXIENCE) 900 mg in sodium chloride 0.9 % 250 mL (2.6471 mg/mL) infusion, 375 mg/m2 = 900 mg (100 % of original dose 375 mg/m2), Intravenous,  Once, 1 of 1 cycle Dose modification: 375 mg/m2 (original dose 375 mg/m2, Cycle 1) Administration: 900 mg (07/13/2019)  for chemotherapy treatment.      HISTORY OF PRESENTING ILLNESS:  Eric [name] 69 y.o.  male with recently due for lab B-cell lymphoma is currently status post R-CEOP #4 approximately 3 weeks ago weeks ago is here for follow-up/proceed with chemotherapy.  Patient's PET scan was delayed because of insurance reasons.  Patient awaiting PET scan tomorrow.  Otherwise not had any recent hospitalizations.  No nausea no vomiting.  Chest pain improved.  Shortness of with improved.  Review of Systems  Constitutional: Negative for chills, diaphoresis and fever.  HENT: Negative for nosebleeds and  sore throat.   Eyes: Negative for double vision.  Respiratory: Negative for hemoptysis, sputum production and wheezing.   Cardiovascular: Negative for chest pain, palpitations, orthopnea and leg swelling.  Gastrointestinal: Negative for abdominal pain, blood in stool, constipation, diarrhea, heartburn, melena, nausea and vomiting.  Genitourinary: Negative for dysuria, frequency and urgency.  Musculoskeletal: Negative for back pain and joint pain.  Skin: Negative.  Negative for itching and rash.  Neurological: Negative for dizziness, tingling, focal weakness, weakness and headaches.  Endo/Heme/Allergies: Does not bruise/bleed easily.  Psychiatric/Behavioral: Negative for depression. The patient does not have insomnia.      MEDICAL HISTORY:  Past Medical History:  Diagnosis Date  . Colon polyps   . Pancreatitis   . Rectal bleeding 02/24/14  . Sepsis ([place]) 09/18/2019    SURGICAL HISTORY: Past Surgical History:  Procedure Laterality Date  . CHOLECYSTECTOMY    . COLONOSCOPY  02/28/14  . IR IMAGING GUIDED PORT INSERTION  07/18/2019  . POLYPECTOMY     colon polyp removed  . TONSILLECTOMY      SOCIAL HISTORY: Social History   Socioeconomic History  . Marital status: Married    Spouse name: Not on file  . Number of children: Not on file  . Years of education: Not on file  . Highest education level: Not on file  Occupational History    Comment: Retired from [employer]  . Smoking status: Current Every Day Smoker    Packs/day: 1.00    Years: 20.00    Pack years: 20.00    Types: Cigars  . Smokeless tobacco: Never Used  . Tobacco comment: smoke 1/2 ppd cigaretes since 69 yo. changed to cigars at age July 2004.   Substance and Sexual Activity  . Alcohol use: Yes    Comment: 6-7 drinks weekly  . Drug use: No  . Sexual activity: Not on file  Other Topics Concern  . Not on file  Social History Narrative   Lives at home with wife; in [place]. retd from [employer] smoker; drinks liquor. Children - grown up.    Social Determinants of Health   Financial Resource Strain:   . Difficulty of Paying Living Expenses: Not on file  Food Insecurity:   . Worried About Charity fundraiser in the Last Year: Not on file  . Ran Out of Food in the Last Year: Not on file  Transportation Needs:   . Lack of Transportation (Medical): Not on file  . Lack of Transportation (Non-Medical): Not on file  Physical Activity:   . Days of Exercise per Week: Not on file  . Minutes of Exercise per Session: Not on file  Stress:   . Feeling of Stress : Not on file  Social Connections:   . Frequency of Communication with Friends and Family: Not on file  . Frequency of Social Gatherings with Friends and Family: Not on file  . Attends Religious Services: Not on file  . Active Member of Clubs or Organizations: Not on file  . Attends Archivist Meetings: Not on file  . Marital Status: Not on file  Intimate Partner Violence:   . Fear of Current or Ex-Partner: Not on file  . Emotionally Abused: Not on file  . Physically Abused: Not on file  . Sexually Abused: Not on file    FAMILY HISTORY: Family History  Adopted: Yes  ALLERGIES:  is allergic to allopurinol; azithromycin; and losartan potassium.  MEDICATIONS:  Current Outpatient Medications  Medication Sig Dispense Refill  . carvedilol (COREG) 6.25 MG tablet Take 1 tablet (6.25 mg total) by mouth 2 (two) times daily. 180 tablet 3  . lidocaine-prilocaine (EMLA) cream Apply generously on the port 30-45 prior to accessing the port. 30 g 0  . Multiple Vitamins-Minerals (MULTI-VITAMIN/MINERALS PO) Take 1 capsule by mouth.    . rivaroxaban (XARELTO) 20 MG TABS tablet Take 1 tablet (20 mg total) by mouth daily with supper. 30 tablet 3   No current facility-administered medications for this visit.   Facility-Administered Medications Ordered in Other Visits  Medication Dose Route Frequency Provider Last Rate  Last Admin  . heparin lock flush 100 unit/mL  500 Units Intracatheter Once PRN Cammie Sickle, MD          .  PHYSICAL EXAMINATION: ECOG PERFORMANCE STATUS: 1 - Symptomatic but completely ambulatory  Vitals:   11/13/19 0917  BP: 137/73  Pulse: 68  Temp: (!) 95.7 F (35.4 C)   Filed Weights   11/13/19 0917  Weight: 279 lb (126.6 kg)    Physical Exam  Constitutional: He is oriented to person, place, and time and well-developed, well-nourished, and in no distress.  HENT:  Head: Normocephalic and atraumatic.  Mouth/Throat: Oropharynx is clear and moist. No oropharyngeal exudate.  Eyes: Pupils are equal, round, and reactive to light.  Neck:  Resolved neck/axillary adenopathy.  Cardiovascular: Normal rate and regular rhythm.  Pulmonary/Chest: Effort normal and breath sounds normal. No respiratory distress. He has no wheezes.  Abdominal: Soft. Bowel sounds are normal. He exhibits no distension and no mass. There is no abdominal tenderness. There is no rebound and no guarding.  Musculoskeletal:        General: No tenderness or edema. Normal range of motion.     Cervical back: Normal range of motion and neck supple.  Neurological: He is alert and oriented to person, place, and time.  Skin: Skin is warm.  Psychiatric: Affect normal.   LABORATORY DATA:  I have reviewed the data as listed Lab Results  Component Value Date   WBC 6.7 11/13/2019   HGB 11.6 (L) 11/13/2019   HCT 36.8 (L) 11/13/2019   MCV 89.1 11/13/2019   PLT 229 11/13/2019   Recent Labs    10/02/19 0834 10/23/19 0825 11/13/19 0809  NA 141 139 139  K 4.2 4.4 4.0  CL 106 104 106  CO2 '27 29 26  '$ GLUCOSE 103* 105* 103*  BUN '14 17 15  '$ CREATININE 0.85 0.72 0.72  CALCIUM 9.4 9.2 9.1  GFRNONAA >60 >60 >60  GFRAA >60 >60 >60  PROT 6.7 6.3* 6.5  ALBUMIN 3.6 3.8 3.8  AST '23 22 21  '$ ALT '23 23 22  '$ ALKPHOS 66 67 70  BILITOT 0.6 0.7 0.7    RADIOGRAPHIC STUDIES: I have personally reviewed the  radiological images as listed and agreed with the findings in the report. No results found.  ASSESSMENT & PLAN:   Lymphoma of lymph nodes ([facility]) #Diffuse large B-cell lymphoma-ABC subtype;  Based on PET scan; stage IV [involvement of the spleen]; s/p R-CEOP s/p #4.   # proceed with R-CEOP #5 today; Labs today reviewed;  acceptable for treatment today. interim PET scan tomorrow sec to insurance.   # Bil acute PE- [nov 2020]-  STABLE.  on xarelto 20 mg/day .   # Borderline LOW EF of 45-50% [see below]- on coreg BID. STABLE.    #  I discussed regarding Covid precautions/and also discussed proceeding with Covid vaccination when available.  Discussed that unfortunately the data safety and efficacy of vaccination is unclear especially in patients with immunocompromised state.  However, I think the benefits of the vaccination outweigh the potential risks. 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   # DISPOSITION:  # Proceed with chemo- this week as planned.  # in 3 weeks- MD-labs-cbc/Cmp/ldh- RCEOP; day-2 etop/d-3-Etop; d-4 udenyca- Dr.B    All questions were answered. The patient knows to call the clinic with any problems, questions or concerns.    Cammie Sickle, MD 11/13/2019 1:02 PM

## 2019-11-14 ENCOUNTER — Encounter
Admission: RE | Admit: 2019-11-14 | Discharge: 2019-11-14 | Disposition: A | Discharge status: Home / Self Care | Payer: Medicare Other | Source: Ambulatory Visit | Attending: Internal Medicine | Admitting: Internal Medicine

## 2019-11-14 ENCOUNTER — Other Ambulatory Visit: Payer: Self-pay

## 2019-11-14 ENCOUNTER — Inpatient Hospital Stay: Payer: Medicare Other

## 2019-11-14 VITALS — BP 126/79 | HR 69 | Temp 96.9°F | Resp 20

## 2019-11-14 DIAGNOSIS — J320 Chronic maxillary sinusitis: Secondary | ICD-10-CM | POA: Insufficient documentation

## 2019-11-14 DIAGNOSIS — K5730 Diverticulosis of large intestine without perforation or abscess without bleeding: Secondary | ICD-10-CM | POA: Insufficient documentation

## 2019-11-14 DIAGNOSIS — C8590 Non-Hodgkin lymphoma, unspecified, unspecified site: Secondary | ICD-10-CM | POA: Diagnosis not present

## 2019-11-14 DIAGNOSIS — I700 Atherosclerosis of aorta: Secondary | ICD-10-CM | POA: Diagnosis not present

## 2019-11-14 DIAGNOSIS — I2510 Atherosclerotic heart disease of native coronary artery without angina pectoris: Secondary | ICD-10-CM | POA: Insufficient documentation

## 2019-11-14 DIAGNOSIS — I517 Cardiomegaly: Secondary | ICD-10-CM | POA: Diagnosis not present

## 2019-11-14 DIAGNOSIS — N200 Calculus of kidney: Secondary | ICD-10-CM | POA: Diagnosis not present

## 2019-11-14 LAB — GLUCOSE, CAPILLARY: Glucose-Capillary: 82 mg/dL (ref 70–99)

## 2019-11-14 MED ORDER — HEPARIN SOD (PORK) LOCK FLUSH 100 UNIT/ML IV SOLN
INTRAVENOUS | Status: AC
  Filled 2019-11-14: qty 5

## 2019-11-14 MED ORDER — HEPARIN SOD (PORK) LOCK FLUSH 100 UNIT/ML IV SOLN
500.0000 [IU] | Freq: Once | INTRAVENOUS | Status: AC
  Administered 2019-11-14: 500 [IU] via INTRAVENOUS
  Filled 2019-11-14: qty 5

## 2019-11-14 MED ORDER — DEXAMETHASONE SODIUM PHOSPHATE 10 MG/ML IJ SOLN
10.0000 mg | Freq: Once | INTRAMUSCULAR | Status: AC
  Administered 2019-11-14: 10 mg via INTRAVENOUS
  Filled 2019-11-14: qty 1

## 2019-11-14 MED ORDER — FLUDEOXYGLUCOSE F - 18 (FDG) INJECTION
14.3000 | Freq: Once | INTRAVENOUS | Status: AC | PRN
  Administered 2019-11-14: 14.52 via INTRAVENOUS

## 2019-11-14 MED ORDER — SODIUM CHLORIDE 0.9% FLUSH
10.0000 mL | Freq: Once | INTRAVENOUS | Status: AC
  Administered 2019-11-14: 10 mL via INTRAVENOUS
  Filled 2019-11-14: qty 10

## 2019-11-14 MED ORDER — SODIUM CHLORIDE 0.9 % IV SOLN
50.0000 mg/m2 | Freq: Once | INTRAVENOUS | Status: AC
  Administered 2019-11-14: 12:00:00 130 mg via INTRAVENOUS
  Filled 2019-11-14: qty 6.5

## 2019-11-14 MED ORDER — SODIUM CHLORIDE 0.9 % IV SOLN
INTRAVENOUS | Status: DC
  Filled 2019-11-14: qty 250

## 2019-11-15 ENCOUNTER — Telehealth: Payer: Self-pay | Admitting: Internal Medicine

## 2019-11-15 ENCOUNTER — Inpatient Hospital Stay: Payer: Medicare Other

## 2019-11-15 ENCOUNTER — Other Ambulatory Visit: Payer: Self-pay

## 2019-11-15 VITALS — BP 130/78 | HR 50 | Temp 96.7°F | Resp 20

## 2019-11-15 DIAGNOSIS — C8338 Diffuse large B-cell lymphoma, lymph nodes of multiple sites: Secondary | ICD-10-CM | POA: Diagnosis not present

## 2019-11-15 DIAGNOSIS — Z5111 Encounter for antineoplastic chemotherapy: Secondary | ICD-10-CM | POA: Diagnosis not present

## 2019-11-15 DIAGNOSIS — Z7901 Long term (current) use of anticoagulants: Secondary | ICD-10-CM | POA: Diagnosis not present

## 2019-11-15 DIAGNOSIS — Z5189 Encounter for other specified aftercare: Secondary | ICD-10-CM | POA: Diagnosis not present

## 2019-11-15 DIAGNOSIS — Z86711 Personal history of pulmonary embolism: Secondary | ICD-10-CM | POA: Diagnosis not present

## 2019-11-15 DIAGNOSIS — Z5112 Encounter for antineoplastic immunotherapy: Secondary | ICD-10-CM | POA: Diagnosis not present

## 2019-11-15 DIAGNOSIS — C8590 Non-Hodgkin lymphoma, unspecified, unspecified site: Secondary | ICD-10-CM

## 2019-11-15 MED ORDER — HEPARIN SOD (PORK) LOCK FLUSH 100 UNIT/ML IV SOLN
500.0000 [IU] | Freq: Once | INTRAVENOUS | Status: AC
  Administered 2019-11-15: 500 [IU] via INTRAVENOUS
  Filled 2019-11-15: qty 5

## 2019-11-15 MED ORDER — HEPARIN SOD (PORK) LOCK FLUSH 100 UNIT/ML IV SOLN
INTRAVENOUS | Status: AC
  Filled 2019-11-15: qty 5

## 2019-11-15 MED ORDER — DEXAMETHASONE SODIUM PHOSPHATE 10 MG/ML IJ SOLN
10.0000 mg | Freq: Once | INTRAMUSCULAR | Status: AC
  Administered 2019-11-15: 09:00:00 10 mg via INTRAVENOUS
  Filled 2019-11-15: qty 1

## 2019-11-15 MED ORDER — SODIUM CHLORIDE 0.9 % IV SOLN
50.0000 mg/m2 | Freq: Once | INTRAVENOUS | Status: AC
  Administered 2019-11-15: 130 mg via INTRAVENOUS
  Filled 2019-11-15: qty 6.5

## 2019-11-15 MED ORDER — SODIUM CHLORIDE 0.9 % IV SOLN
INTRAVENOUS | Status: DC
  Filled 2019-11-15: qty 250

## 2019-11-15 MED ORDER — SODIUM CHLORIDE 0.9% FLUSH
10.0000 mL | Freq: Once | INTRAVENOUS | Status: AC
  Administered 2019-11-15: 10 mL via INTRAVENOUS
  Filled 2019-11-15: qty 10

## 2019-11-15 NOTE — Telephone Encounter (Signed)
Spoke to patient regarding results of PET scan-improved response rate.  Will discuss further at subsequent visits

## 2019-11-16 ENCOUNTER — Other Ambulatory Visit: Payer: Self-pay

## 2019-11-16 ENCOUNTER — Inpatient Hospital Stay: Payer: Medicare Other

## 2019-11-16 DIAGNOSIS — Z86711 Personal history of pulmonary embolism: Secondary | ICD-10-CM | POA: Diagnosis not present

## 2019-11-16 DIAGNOSIS — C8338 Diffuse large B-cell lymphoma, lymph nodes of multiple sites: Secondary | ICD-10-CM | POA: Diagnosis not present

## 2019-11-16 DIAGNOSIS — Z5189 Encounter for other specified aftercare: Secondary | ICD-10-CM | POA: Diagnosis not present

## 2019-11-16 DIAGNOSIS — Z5111 Encounter for antineoplastic chemotherapy: Secondary | ICD-10-CM | POA: Diagnosis not present

## 2019-11-16 DIAGNOSIS — Z7901 Long term (current) use of anticoagulants: Secondary | ICD-10-CM | POA: Diagnosis not present

## 2019-11-16 DIAGNOSIS — C8590 Non-Hodgkin lymphoma, unspecified, unspecified site: Secondary | ICD-10-CM

## 2019-11-16 DIAGNOSIS — Z5112 Encounter for antineoplastic immunotherapy: Secondary | ICD-10-CM | POA: Diagnosis not present

## 2019-11-16 MED ORDER — PEGFILGRASTIM-CBQV 6 MG/0.6ML ~~LOC~~ SOSY
6.0000 mg | PREFILLED_SYRINGE | Freq: Once | SUBCUTANEOUS | Status: AC
  Administered 2019-11-16: 6 mg via SUBCUTANEOUS
  Filled 2019-11-16: qty 0.6

## 2019-12-03 ENCOUNTER — Other Ambulatory Visit: Payer: Self-pay

## 2019-12-04 ENCOUNTER — Inpatient Hospital Stay: Payer: Medicare Other

## 2019-12-04 ENCOUNTER — Other Ambulatory Visit: Payer: Self-pay

## 2019-12-04 ENCOUNTER — Inpatient Hospital Stay (HOSPITAL_BASED_OUTPATIENT_CLINIC_OR_DEPARTMENT_OTHER): Payer: Medicare Other | Admitting: Internal Medicine

## 2019-12-04 ENCOUNTER — Inpatient Hospital Stay: Payer: Medicare Other | Attending: Internal Medicine

## 2019-12-04 VITALS — BP 113/68 | HR 63 | Temp 97.1°F | Resp 18

## 2019-12-04 DIAGNOSIS — C8590 Non-Hodgkin lymphoma, unspecified, unspecified site: Secondary | ICD-10-CM

## 2019-12-04 DIAGNOSIS — Z5111 Encounter for antineoplastic chemotherapy: Secondary | ICD-10-CM | POA: Insufficient documentation

## 2019-12-04 DIAGNOSIS — Z5112 Encounter for antineoplastic immunotherapy: Secondary | ICD-10-CM | POA: Diagnosis not present

## 2019-12-04 DIAGNOSIS — C8338 Diffuse large B-cell lymphoma, lymph nodes of multiple sites: Secondary | ICD-10-CM | POA: Insufficient documentation

## 2019-12-04 DIAGNOSIS — Z5189 Encounter for other specified aftercare: Secondary | ICD-10-CM | POA: Diagnosis not present

## 2019-12-04 DIAGNOSIS — Z7901 Long term (current) use of anticoagulants: Secondary | ICD-10-CM | POA: Diagnosis not present

## 2019-12-04 DIAGNOSIS — Z86711 Personal history of pulmonary embolism: Secondary | ICD-10-CM | POA: Insufficient documentation

## 2019-12-04 LAB — COMPREHENSIVE METABOLIC PANEL
ALT: 25 U/L (ref 0–44)
AST: 24 U/L (ref 15–41)
Albumin: 3.9 g/dL (ref 3.5–5.0)
Alkaline Phosphatase: 70 U/L (ref 38–126)
Anion gap: 7 (ref 5–15)
BUN: 18 mg/dL (ref 8–23)
CO2: 25 mmol/L (ref 22–32)
Calcium: 8.9 mg/dL (ref 8.9–10.3)
Chloride: 108 mmol/L (ref 98–111)
Creatinine, Ser: 0.77 mg/dL (ref 0.61–1.24)
GFR calc Af Amer: >60 mL/min (ref >60)
GFR calc non Af Amer: >60 mL/min (ref >60)
Glucose, Bld: 101 mg/dL — ABNORMAL HIGH (ref 70–99)
Potassium: 3.7 mmol/L (ref 3.5–5.1)
Sodium: 140 mmol/L (ref 135–145)
Total Bilirubin: 0.5 mg/dL (ref 0.3–1.2)
Total Protein: 6.4 g/dL — ABNORMAL LOW (ref 6.5–8.1)

## 2019-12-04 LAB — CBC WITH DIFFERENTIAL/PLATELET
Abs Immature Granulocytes: 0.16 10*3/uL — ABNORMAL HIGH (ref 0.00–0.07)
Basophils Absolute: 0.1 10*3/uL (ref 0.0–0.1)
Basophils Relative: 1 %
Eosinophils Absolute: 0.2 10*3/uL (ref 0.0–0.5)
Eosinophils Relative: 2 %
HCT: 38.3 % — ABNORMAL LOW (ref 39.0–52.0)
Hemoglobin: 12.3 g/dL — ABNORMAL LOW (ref 13.0–17.0)
Immature Granulocytes: 2 %
Lymphocytes Relative: 14 %
Lymphs Abs: 1 10*3/uL (ref 0.7–4.0)
MCH: 28.6 pg (ref 26.0–34.0)
MCHC: 32.1 g/dL (ref 30.0–36.0)
MCV: 89.1 fL (ref 80.0–100.0)
Monocytes Absolute: 0.7 10*3/uL (ref 0.1–1.0)
Monocytes Relative: 10 %
Neutro Abs: 4.8 10*3/uL (ref 1.7–7.7)
Neutrophils Relative %: 71 %
Platelets: 207 10*3/uL (ref 150–400)
RBC: 4.3 MIL/uL (ref 4.22–5.81)
RDW: 19.1 % — ABNORMAL HIGH (ref 11.5–15.5)
WBC: 6.8 10*3/uL (ref 4.0–10.5)
nRBC: 0 % (ref 0.0–0.2)

## 2019-12-04 LAB — LACTATE DEHYDROGENASE: LDH: 163 U/L (ref 98–192)

## 2019-12-04 MED ORDER — SODIUM CHLORIDE 0.9 % IV SOLN
50.0000 mg/m2 | Freq: Once | INTRAVENOUS | Status: AC
  Administered 2019-12-04: 130 mg via INTRAVENOUS
  Filled 2019-12-04: qty 6.5

## 2019-12-04 MED ORDER — SODIUM CHLORIDE 0.9 % IV SOLN
793.0000 mg/m2 | Freq: Once | INTRAVENOUS | Status: AC
  Administered 2019-12-04: 2000 mg via INTRAVENOUS
  Filled 2019-12-04: qty 100

## 2019-12-04 MED ORDER — SODIUM CHLORIDE 0.9% FLUSH
10.0000 mL | INTRAVENOUS | Status: DC | PRN
  Administered 2019-12-04: 10 mL via INTRAVENOUS
  Filled 2019-12-04: qty 10

## 2019-12-04 MED ORDER — SODIUM CHLORIDE 0.9 % IV SOLN
Freq: Once | INTRAVENOUS | Status: AC
  Filled 2019-12-04: qty 250

## 2019-12-04 MED ORDER — PREDNISONE 50 MG PO TABS: ORAL_TABLET | ORAL | 0 refills | Status: DC

## 2019-12-04 MED ORDER — SODIUM CHLORIDE 0.9 % IV SOLN
375.0000 mg/m2 | Freq: Once | INTRAVENOUS | Status: AC
  Administered 2019-12-04: 900 mg via INTRAVENOUS
  Filled 2019-12-04: qty 50

## 2019-12-04 MED ORDER — SODIUM CHLORIDE 0.9 % IV SOLN
Freq: Once | INTRAVENOUS | Status: AC
  Filled 2019-12-04: qty 5

## 2019-12-04 MED ORDER — HEPARIN SOD (PORK) LOCK FLUSH 100 UNIT/ML IV SOLN
INTRAVENOUS | Status: AC
  Filled 2019-12-04: qty 5

## 2019-12-04 MED ORDER — ACETAMINOPHEN 325 MG PO TABS
650.0000 mg | ORAL_TABLET | Freq: Once | ORAL | Status: AC
  Administered 2019-12-04: 650 mg via ORAL
  Filled 2019-12-04: qty 2

## 2019-12-04 MED ORDER — SODIUM CHLORIDE 0.9 % IV SOLN
375.0000 mg/m2 | Freq: Once | INTRAVENOUS | Status: DC
  Filled 2019-12-04: qty 90

## 2019-12-04 MED ORDER — VINCRISTINE SULFATE CHEMO INJECTION 1 MG/ML
2.0000 mg | Freq: Once | INTRAVENOUS | Status: AC
  Administered 2019-12-04: 2 mg via INTRAVENOUS
  Filled 2019-12-04: qty 2

## 2019-12-04 MED ORDER — PALONOSETRON HCL INJECTION 0.25 MG/5ML
0.2500 mg | Freq: Once | INTRAVENOUS | Status: AC
  Administered 2019-12-04: 0.25 mg via INTRAVENOUS
  Filled 2019-12-04: qty 5

## 2019-12-04 MED ORDER — HEPARIN SOD (PORK) LOCK FLUSH 100 UNIT/ML IV SOLN
500.0000 [IU] | Freq: Once | INTRAVENOUS | Status: AC
  Administered 2019-12-04: 500 [IU] via INTRAVENOUS
  Filled 2019-12-04: qty 5

## 2019-12-04 MED ORDER — DIPHENHYDRAMINE HCL 25 MG PO CAPS
50.0000 mg | ORAL_CAPSULE | Freq: Once | ORAL | Status: AC
  Administered 2019-12-04: 50 mg via ORAL
  Filled 2019-12-04: qty 2

## 2019-12-04 NOTE — Assessment & Plan Note (Addendum)
#  Diffuse large B-cell lymphoma-ABC subtype;  Based on PET scan; stage IV [involvement of the spleen]; s/p R-CEOP s/p #5; Jan 13th 2021- PET - significant response; near CR- except mild uptake noted left level 2 mass.   # proceed with R-CEOP #6  today; Labs today reviewed;  acceptable for treatment today. Reminded of steroids; script sent.  Discussed that we will plan to recommend repeat imaging approximately 2 to 3 months post chemotherapy.  Will order at next visit.  # Bil acute PE- [nov [id number]- on xarelto 20 mg/day-stable  # hearing loss-new.  Not typical of current chemo; would defer to ENT; Dr.Vaught  # Borderline LOW EF of 45-50% [see below]- on coreg BID. S stable  # DISPOSITION:  # Proceed with chemo- this week as planned.  # in 6 weeks- MD-labs-cbc/Cmp/ldh-port flush; no chemo  Dr.B  # I reviewed the blood work- with the patient in detail; also reviewed the imaging independently [as summarized above]; and with the patient in detail.

## 2019-12-04 NOTE — Progress Notes (Signed)
[place] NOTE  Patient Care Team: [name], MD as PCP - General (Family Medicine) [name] [name], MD (General Surgery)  CHIEF COMPLAINTS/PURPOSE OF CONSULTATION: Diffuse large B-cell lymphoma  #  Oncology History Overview Note  #June 29, 2019-CT- bulky bilateral neck adenopathy left more than right; September 2020-Diffuse B-cell lymphoma; ABC subtype; NEGATIVE FISH studies.  PET scan -bulky adenopathy involving neck /lymphadenopathy mediastinum /abdomen/pelvis inguinal lymph nodes; splenic involvement.  Question bone.  Stage IV [spleen involvement; no bone marrow biopsy].  BCL-2: Positive /BCL 6: Positive /MUM-1: Positive /CD10: Negative 'Ki-67: High proliferation index (90%) /CD30: Negative /Cyclin D1: Negative C-MYC: Equivocal (20% staining seen on limited and fragmented tissue) /EBER: Negative   # Ritux-Pred- 9/11; 9/18- R-COP [held adria- sec to EF 45-50%]  #November 2020-bilateral PE; on Eliquis  # Sep 2020- Borderline low EF/Left ventricular hypokinesis [Dr.Gollan]-s October 9th stress test-nonischemic;   # DIAGNOSIS: DLBCL  STAGE:   4      ;GOALS: Cure  CURRENT/MOST RECENT THERAPY : R-COP/-R-CEOP. [C]    Lymphoma of lymph nodes ([place])  07/03/2019 Initial Diagnosis   Lymphoma of lymph nodes ([place])   07/13/2019 -  Chemotherapy   The patient had palonosetron (ALOXI) injection 0.25 mg, 0.25 mg, Intravenous,  Once, 7 of 7 cycles Administration: 0.25 mg (07/20/2019), 0.25 mg (08/13/2019), 0.25 mg (11/13/2019), 0.25 mg (09/03/2019), 0.25 mg (10/02/2019), 0.25 mg (10/23/2019) pegfilgrastim (NEULASTA ONPRO KIT) injection 6 mg, 6 mg, Subcutaneous, Once, 2 of 2 cycles Administration: 6 mg (10/04/2019), 6 mg (10/25/2019) pegfilgrastim-cbqv (UDENYCA) injection 6 mg, 6 mg, Subcutaneous, Once, 5 of 5 cycles Administration: 6 mg (07/23/2019), 6 mg (08/16/2019), 6 mg (09/06/2019), 6 mg (11/16/2019) vinCRIStine (ONCOVIN) 2 mg in sodium chloride 0.9 % 50 mL chemo  infusion, 2 mg, Intravenous,  Once, 7 of 7 cycles Administration: 2 mg (07/20/2019), 2 mg (08/13/2019), 2 mg (11/13/2019), 2 mg (09/03/2019), 2 mg (10/02/2019), 2 mg (10/23/2019) cyclophosphamide (CYTOXAN) 1,900 mg in sodium chloride 0.9 % 250 mL chemo infusion, 750 mg/m2 = 1,900 mg, Intravenous,  Once, 7 of 7 cycles Administration: 1,900 mg (07/20/2019), 1,900 mg (08/13/2019), 2,000 mg (11/13/2019), 1,900 mg (09/03/2019), 2,000 mg (10/02/2019), 2,000 mg (10/23/2019) etoposide (VEPESID) 250 mg in sodium chloride 0.9 % 1,000 mL chemo infusion, 100 mg/m2 = 250 mg, Intravenous,  Once, 6 of 6 cycles Dose modification: 50 mg/m2 (original dose 100 mg/m2, Cycle 3, Reason: Provider Judgment) Administration: 130 mg (08/14/2019), 130 mg (08/15/2019), 130 mg (09/03/2019), 130 mg (09/04/2019), 130 mg (09/05/2019), 130 mg (10/03/2019), 130 mg (10/04/2019), 130 mg (10/24/2019), 130 mg (10/25/2019), 130 mg (11/13/2019), 130 mg (11/14/2019), 130 mg (11/15/2019), 130 mg (10/02/2019), 130 mg (10/23/2019) dexamethasone (DECADRON) 12 mg in sodium chloride 0.9 % 145 mL IVPB, , Intravenous,  Once, 8 of 8 cycles Administration:  (07/13/2019),  (07/20/2019),  (08/13/2019),  (11/13/2019),  (09/03/2019),  (10/02/2019),  (10/23/2019) riTUXimab-pvvr (RUXIENCE) 900 mg in sodium chloride 0.9 % 250 mL (2.6471 mg/mL) infusion, 375 mg/m2 = 900 mg (100 % of original dose 375 mg/m2), Intravenous,  Once, 1 of 1 cycle Dose modification: 375 mg/m2 (original dose 375 mg/m2, Cycle 1) Administration: 900 mg (07/13/2019)  for chemotherapy treatment.      HISTORY OF PRESENTING ILLNESS:  Eric [name] 69 y.o.  male diffuse large B-cell lymphoma is currently status post R-CEOP #5 approximately 3 weeks ago weeks ago- is here for follow-up/proceed with chemotherapy-cycle #6 of chemotherapy.  Patient complains of mild difficulty hearing.  Concerned possible side effect from chemotherapy.  Otherwise appetite is good with no weight loss.  Concerned about weight  gain.    Review of Systems  Constitutional: Negative for chills, diaphoresis and fever.  HENT: Positive for hearing loss. Negative for nosebleeds and sore throat.   Eyes: Negative for double vision.  Respiratory: Negative for hemoptysis, sputum production and wheezing.   Cardiovascular: Negative for chest pain, palpitations, orthopnea and leg swelling.  Gastrointestinal: Negative for abdominal pain, blood in stool, constipation, diarrhea, heartburn, melena, nausea and vomiting.  Genitourinary: Negative for dysuria, frequency and urgency.  Musculoskeletal: Negative for back pain and joint pain.  Skin: Negative.  Negative for itching and rash.  Neurological: Negative for dizziness, tingling, focal weakness, weakness and headaches.  Endo/Heme/Allergies: Does not bruise/bleed easily.  Psychiatric/Behavioral: Negative for depression. The patient does not have insomnia.      MEDICAL HISTORY:  Past Medical History:  Diagnosis Date  . Colon polyps   . Pancreatitis   . Rectal bleeding 02/24/14  . Sepsis ([place]) 09/18/2019    SURGICAL HISTORY: Past Surgical History:  Procedure Laterality Date  . CHOLECYSTECTOMY    . COLONOSCOPY  02/28/14  . IR IMAGING GUIDED PORT INSERTION  07/18/2019  . POLYPECTOMY     colon polyp removed  . TONSILLECTOMY      SOCIAL HISTORY: Social History   Socioeconomic History  . Marital status: Married    Spouse name: Not on file  . Number of children: Not on file  . Years of education: Not on file  . Highest education level: Not on file  Occupational History    Comment: Retired from [employer]  . Smoking status: Current Every Day Smoker    Packs/day: 1.00    Years: 20.00    Pack years: 20.00    Types: Cigars  . Smokeless tobacco: Never Used  . Tobacco comment: smoke 1/2 ppd cigaretes since 69 yo. changed to cigars at age July 2004.   Substance and Sexual Activity  . Alcohol use: Yes    Comment: 6-7 drinks weekly  . Drug use: No  .  Sexual activity: Not on file  Other Topics Concern  . Not on file  Social History Narrative   Lives at home with wife; in [place]. retd from [employer] smoker; drinks liquor. Children - grown up.    Social Determinants of Health   Financial Resource Strain:   . Difficulty of Paying Living Expenses: Not on file  Food Insecurity:   . Worried About Charity fundraiser in the Last Year: Not on file  . Ran Out of Food in the Last Year: Not on file  Transportation Needs:   . Lack of Transportation (Medical): Not on file  . Lack of Transportation (Non-Medical): Not on file  Physical Activity:   . Days of Exercise per Week: Not on file  . Minutes of Exercise per Session: Not on file  Stress:   . Feeling of Stress : Not on file  Social Connections:   . Frequency of Communication with Friends and Family: Not on file  . Frequency of Social Gatherings with Friends and Family: Not on file  . Attends Religious Services: Not on file  . Active Member of Clubs or Organizations: Not on file  . Attends Archivist Meetings: Not on file  . Marital Status: Not on file  Intimate Partner Violence:   . Fear of Current or Ex-Partner: Not on file  . Emotionally Abused: Not on file  . Physically Abused: Not  on file  . Sexually Abused: Not on file    FAMILY HISTORY: Family History  Adopted: Yes    ALLERGIES:  is allergic to allopurinol; azithromycin; and losartan potassium.  MEDICATIONS:  Current Outpatient Medications  Medication Sig Dispense Refill  . carvedilol (COREG) 6.25 MG tablet Take 1 tablet (6.25 mg total) by mouth 2 (two) times daily. 180 tablet 3  . lidocaine-prilocaine (EMLA) cream Apply generously on the port 30-45 prior to accessing the port. 30 g 0  . Multiple Vitamins-Minerals (MULTI-VITAMIN/MINERALS PO) Take 1 capsule by mouth.    . rivaroxaban (XARELTO) 20 MG TABS tablet Take 1 tablet (20 mg total) by mouth daily with supper. 30 tablet 3  . predniSONE  (DELTASONE) 50 MG tablet Take 2 tablets once day x 5 days. START on day of your chemotherapy. Take with food. 10 tablet 0   No current facility-administered medications for this visit.   Facility-Administered Medications Ordered in Other Visits  Medication Dose Route Frequency Provider Last Rate Last Admin  . etoposide (VEPESID) 130 mg in sodium chloride 0.9 % 500 mL chemo infusion  50 mg/m2 (Treatment Plan Recorded) Intravenous Once Cammie Sickle, MD 507 mL/hr at 12/04/19 1209 130 mg at 12/04/19 1209  . heparin lock flush 100 unit/mL  500 Units Intravenous Once Charlaine Dalton R, MD      . riTUXimab-pvvr (RUXIENCE) 900 mg in sodium chloride 0.9 % 160 mL infusion  375 mg/m2 (Treatment Plan Recorded) Intravenous Once Charlaine Dalton R, MD      . sodium chloride flush (NS) 0.9 % injection 10 mL  10 mL Intravenous PRN Cammie Sickle, MD   10 mL at 12/04/19 0814      .  PHYSICAL EXAMINATION: ECOG PERFORMANCE STATUS: 1 - Symptomatic but completely ambulatory  Vitals:   12/04/19 0911  BP: 123/73  Pulse: 72  Temp: (!) 97.2 F (36.2 C)   Filed Weights   12/04/19 0911  Weight: 284 lb (128.8 kg)    Physical Exam  Constitutional: He is oriented to person, place, and time and well-developed, well-nourished, and in no distress.  HENT:  Head: Normocephalic and atraumatic.  Mouth/Throat: Oropharynx is clear and moist. No oropharyngeal exudate.  Eyes: Pupils are equal, round, and reactive to light.  Neck:  Resolved neck/axillary adenopathy.  Cardiovascular: Normal rate and regular rhythm.  Pulmonary/Chest: Effort normal and breath sounds normal. No respiratory distress. He has no wheezes.  Abdominal: Soft. Bowel sounds are normal. He exhibits no distension and no mass. There is no abdominal tenderness. There is no rebound and no guarding.  Musculoskeletal:        General: No tenderness or edema. Normal range of motion.     Cervical back: Normal range of motion and  neck supple.  Neurological: He is alert and oriented to person, place, and time.  Skin: Skin is warm.  Psychiatric: Affect normal.   LABORATORY DATA:  I have reviewed the data as listed Lab Results  Component Value Date   WBC 6.8 12/04/2019   HGB 12.3 (L) 12/04/2019   HCT 38.3 (L) 12/04/2019   MCV 89.1 12/04/2019   PLT 207 12/04/2019   Recent Labs    10/23/19 0825 11/13/19 0809 12/04/19 0801  NA 139 139 140  K 4.4 4.0 3.7  CL 104 106 108  CO2 '29 26 25  '$ GLUCOSE 105* 103* 101*  BUN '17 15 18  '$ CREATININE 0.72 0.72 0.77  CALCIUM 9.2 9.1 8.9  GFRNONAA >60 >60 >60  GFRAA >  60 >60 >60  PROT 6.3* 6.5 6.4*  ALBUMIN 3.8 3.8 3.9  AST '22 21 24  '$ ALT '23 22 25  '$ ALKPHOS 67 70 70  BILITOT 0.7 0.7 0.5    RADIOGRAPHIC STUDIES: I have personally reviewed the radiological images as listed and agreed with the findings in the report. NM PET Image Restag (PS) Skull Base To Thigh  Result Date: 11/14/2019 CLINICAL DATA:  Subsequent treatment strategy for lymphoma. EXAM: NUCLEAR MEDICINE PET SKULL BASE TO THIGH TECHNIQUE: 14.5 mCi F-18 FDG was injected intravenously. Full-ring PET imaging was performed from the skull base to thigh after the radiotracer. CT data was obtained and used for attenuation correction and anatomic localization. Fasting blood glucose: 82 mg/dl COMPARISON:  07/10/2019 and CT examination from 09/18/2019 FINDINGS: Mediastinal blood pool activity: SUV max 2.1 Liver activity: SUV max 3.8 NECK: The previous extensive Deauville 5 adenopathy in the neck has mainly resolved (Deauville 1 disease). Previous posterior nasopharyngeal activity in supraglottic activity has likewise resolved. A 0.6 cm left level IIb lymph node on image 42/3 has a maximum SUV of 1.3, compatible with a small focus of residual Deauville 2 disease. Incidental CT findings: Hypodensity in the left cerebellum is likely attributable to streak artifact. Chronic left maxillary sinusitis. CHEST: The previous  supraclavicular, paratracheal, axillary, hilar, infrahilar, subcarinal, and right internal mammary adenopathy has resolved (Deauville 1). No significant residual hypermetabolic activity or pathologic adenopathy in the chest. Incidental CT findings: Right Port-A-Cath tip: Lower SVC. Coronary, aortic arch, and branch vessel atherosclerotic vascular disease. Mild cardiomegaly. ABDOMEN/PELVIS: Previous porta hepatis, retroperitoneal, and pelvic adenopathy has resolved (Deauville 1). No significant residual hypermetabolic adenopathy in the abdomen. Prior splenomegaly and prior accentuated splenic activity has resolved. No findings of current active malignancy in the abdomen/pelvis. Incidental CT findings: Cholecystectomy. Nonobstructive left renal calculi. Scattered colonic diverticula with sigmoid colon diverticulosis. SKELETON: Previous diffuse marrow accentuation has resolved. No focal hypermetabolic bony lesion observed. Incidental CT findings: none IMPRESSION: 1. The previous extensive Deauville 5 adenopathy in the neck, chest, and abdomen has for the most part completely resolved (Deauville 1). A residual 0.6 cm left level IIb lymph node has a maximum SUV of 1.3, compatible with a small focus of residual Deauville 2 disease. The spleen and bone marrow currently appear normal. 2. Other imaging findings of potential clinical significance: Aortic Atherosclerosis (ICD10-I70.0). Coronary atherosclerosis. Mild cardiomegaly. Chronic left maxillary sinusitis. Nonobstructive left nephrolithiasis. Sigmoid colon diverticulosis. Electronically Signed   By: Van Clines M.D.   On: 11/14/2019 14:57    ASSESSMENT & PLAN:   Lymphoma of lymph nodes (HCC) #Diffuse large B-cell lymphoma-ABC subtype;  Based on PET scan; stage IV [involvement of the spleen]; s/p R-CEOP s/p #5; Jan 13th 2021- PET - significant response; near CR- except mild uptake noted left level 2 mass.   # proceed with R-CEOP #6  today; Labs today  reviewed;  acceptable for treatment today. Reminded of steroids; script sent.  Discussed that we will plan to recommend repeat imaging approximately 2 to 3 months post chemotherapy.  Will order at next visit.  # Bil acute PE- [nov 7517]- on xarelto 20 mg/day-stable  # hearing loss-new.  Not typical of current chemo; would defer to ENT; Dr.Vaught  # Borderline LOW EF of 45-50% [see below]- on coreg BID. S stable  # DISPOSITION:  # Proceed with chemo- this week as planned.  # in 6 weeks- MD-labs-cbc/Cmp/ldh-port flush; no chemo  Dr.B  # I reviewed the blood work- with the patient in detail;  also reviewed the imaging independently [as summarized above]; and with the patient in detail.      All questions were answered. The patient knows to call the clinic with any problems, questions or concerns.    Cammie Sickle, MD 12/04/2019 12:51 PM

## 2019-12-05 ENCOUNTER — Inpatient Hospital Stay: Payer: Medicare Other

## 2019-12-05 ENCOUNTER — Other Ambulatory Visit: Payer: Self-pay

## 2019-12-05 VITALS — BP 133/79 | HR 87 | Temp 97.4°F | Resp 20

## 2019-12-05 DIAGNOSIS — C8338 Diffuse large B-cell lymphoma, lymph nodes of multiple sites: Secondary | ICD-10-CM | POA: Diagnosis not present

## 2019-12-05 DIAGNOSIS — Z5111 Encounter for antineoplastic chemotherapy: Secondary | ICD-10-CM | POA: Diagnosis not present

## 2019-12-05 DIAGNOSIS — Z7901 Long term (current) use of anticoagulants: Secondary | ICD-10-CM | POA: Diagnosis not present

## 2019-12-05 DIAGNOSIS — Z86711 Personal history of pulmonary embolism: Secondary | ICD-10-CM | POA: Diagnosis not present

## 2019-12-05 DIAGNOSIS — Z5112 Encounter for antineoplastic immunotherapy: Secondary | ICD-10-CM | POA: Diagnosis not present

## 2019-12-05 DIAGNOSIS — Z5189 Encounter for other specified aftercare: Secondary | ICD-10-CM | POA: Diagnosis not present

## 2019-12-05 DIAGNOSIS — C8590 Non-Hodgkin lymphoma, unspecified, unspecified site: Secondary | ICD-10-CM

## 2019-12-05 MED ORDER — SODIUM CHLORIDE 0.9% FLUSH
10.0000 mL | Freq: Once | INTRAVENOUS | Status: AC
  Administered 2019-12-05: 09:00:00 10 mL via INTRAVENOUS
  Filled 2019-12-05: qty 10

## 2019-12-05 MED ORDER — DEXAMETHASONE SODIUM PHOSPHATE 10 MG/ML IJ SOLN
10.0000 mg | Freq: Once | INTRAMUSCULAR | Status: AC
  Administered 2019-12-05: 10 mg via INTRAVENOUS
  Filled 2019-12-05: qty 1

## 2019-12-05 MED ORDER — SODIUM CHLORIDE 0.9 % IV SOLN
INTRAVENOUS | Status: DC
  Filled 2019-12-05: qty 250

## 2019-12-05 MED ORDER — HEPARIN SOD (PORK) LOCK FLUSH 100 UNIT/ML IV SOLN
500.0000 [IU] | Freq: Once | INTRAVENOUS | Status: AC
  Administered 2019-12-05: 500 [IU] via INTRAVENOUS
  Filled 2019-12-05: qty 5

## 2019-12-05 MED ORDER — HEPARIN SOD (PORK) LOCK FLUSH 100 UNIT/ML IV SOLN
INTRAVENOUS | Status: AC
  Filled 2019-12-05: qty 5

## 2019-12-05 MED ORDER — SODIUM CHLORIDE 0.9 % IV SOLN
50.0000 mg/m2 | Freq: Once | INTRAVENOUS | Status: AC
  Administered 2019-12-05: 130 mg via INTRAVENOUS
  Filled 2019-12-05: qty 6.5

## 2019-12-06 ENCOUNTER — Other Ambulatory Visit: Payer: Self-pay

## 2019-12-06 ENCOUNTER — Inpatient Hospital Stay: Payer: Medicare Other

## 2019-12-06 VITALS — BP 142/79 | HR 82 | Temp 96.6°F | Resp 20

## 2019-12-06 DIAGNOSIS — C8338 Diffuse large B-cell lymphoma, lymph nodes of multiple sites: Secondary | ICD-10-CM | POA: Diagnosis not present

## 2019-12-06 DIAGNOSIS — C8590 Non-Hodgkin lymphoma, unspecified, unspecified site: Secondary | ICD-10-CM

## 2019-12-06 DIAGNOSIS — Z86711 Personal history of pulmonary embolism: Secondary | ICD-10-CM | POA: Diagnosis not present

## 2019-12-06 DIAGNOSIS — Z5189 Encounter for other specified aftercare: Secondary | ICD-10-CM | POA: Diagnosis not present

## 2019-12-06 DIAGNOSIS — Z5111 Encounter for antineoplastic chemotherapy: Secondary | ICD-10-CM | POA: Diagnosis not present

## 2019-12-06 DIAGNOSIS — Z7901 Long term (current) use of anticoagulants: Secondary | ICD-10-CM | POA: Diagnosis not present

## 2019-12-06 DIAGNOSIS — Z5112 Encounter for antineoplastic immunotherapy: Secondary | ICD-10-CM | POA: Diagnosis not present

## 2019-12-06 MED ORDER — DEXAMETHASONE SODIUM PHOSPHATE 10 MG/ML IJ SOLN
10.0000 mg | Freq: Once | INTRAMUSCULAR | Status: AC
  Administered 2019-12-06: 10:00:00 10 mg via INTRAVENOUS
  Filled 2019-12-06: qty 1

## 2019-12-06 MED ORDER — SODIUM CHLORIDE 0.9 % IV SOLN
50.0000 mg/m2 | Freq: Once | INTRAVENOUS | Status: AC
  Administered 2019-12-06: 130 mg via INTRAVENOUS
  Filled 2019-12-06: qty 6.5

## 2019-12-06 MED ORDER — SODIUM CHLORIDE 0.9% FLUSH
10.0000 mL | INTRAVENOUS | Status: DC | PRN
  Administered 2019-12-06: 09:00:00 10 mL via INTRAVENOUS
  Filled 2019-12-06: qty 10

## 2019-12-06 MED ORDER — HEPARIN SOD (PORK) LOCK FLUSH 100 UNIT/ML IV SOLN
INTRAVENOUS | Status: AC
  Filled 2019-12-06: qty 5

## 2019-12-06 MED ORDER — SODIUM CHLORIDE 0.9 % IV SOLN
Freq: Once | INTRAVENOUS | Status: AC
  Filled 2019-12-06: qty 250

## 2019-12-06 MED ORDER — HEPARIN SOD (PORK) LOCK FLUSH 100 UNIT/ML IV SOLN
500.0000 [IU] | Freq: Once | INTRAVENOUS | Status: AC
  Administered 2019-12-06: 500 [IU] via INTRAVENOUS
  Filled 2019-12-06: qty 5

## 2019-12-07 ENCOUNTER — Other Ambulatory Visit: Payer: Self-pay

## 2019-12-07 ENCOUNTER — Inpatient Hospital Stay: Payer: Medicare Other

## 2019-12-07 DIAGNOSIS — Z5111 Encounter for antineoplastic chemotherapy: Secondary | ICD-10-CM | POA: Diagnosis not present

## 2019-12-07 DIAGNOSIS — Z7901 Long term (current) use of anticoagulants: Secondary | ICD-10-CM | POA: Diagnosis not present

## 2019-12-07 DIAGNOSIS — Z5189 Encounter for other specified aftercare: Secondary | ICD-10-CM | POA: Diagnosis not present

## 2019-12-07 DIAGNOSIS — Z86711 Personal history of pulmonary embolism: Secondary | ICD-10-CM | POA: Diagnosis not present

## 2019-12-07 DIAGNOSIS — C8590 Non-Hodgkin lymphoma, unspecified, unspecified site: Secondary | ICD-10-CM

## 2019-12-07 DIAGNOSIS — Z5112 Encounter for antineoplastic immunotherapy: Secondary | ICD-10-CM | POA: Diagnosis not present

## 2019-12-07 DIAGNOSIS — C8338 Diffuse large B-cell lymphoma, lymph nodes of multiple sites: Secondary | ICD-10-CM | POA: Diagnosis not present

## 2019-12-07 MED ORDER — PEGFILGRASTIM-CBQV 6 MG/0.6ML ~~LOC~~ SOSY
6.0000 mg | PREFILLED_SYRINGE | Freq: Once | SUBCUTANEOUS | Status: AC
  Administered 2019-12-07: 6 mg via SUBCUTANEOUS
  Filled 2019-12-07: qty 0.6

## 2019-12-14 ENCOUNTER — Other Ambulatory Visit: Payer: Medicare Other

## 2019-12-24 ENCOUNTER — Other Ambulatory Visit: Payer: Self-pay

## 2019-12-24 ENCOUNTER — Ambulatory Visit (INDEPENDENT_AMBULATORY_CARE_PROVIDER_SITE_OTHER): Payer: Medicare Other | Admitting: Family Medicine

## 2019-12-24 ENCOUNTER — Encounter: Payer: Self-pay | Admitting: Family Medicine

## 2019-12-24 VITALS — BP 118/77 | HR 71 | Temp 96.0°F | Wt 287.4 lb

## 2019-12-24 DIAGNOSIS — Z125 Encounter for screening for malignant neoplasm of prostate: Secondary | ICD-10-CM | POA: Diagnosis not present

## 2019-12-24 DIAGNOSIS — Z136 Encounter for screening for cardiovascular disorders: Secondary | ICD-10-CM

## 2019-12-24 DIAGNOSIS — I2699 Other pulmonary embolism without acute cor pulmonale: Secondary | ICD-10-CM

## 2019-12-24 NOTE — Progress Notes (Signed)
Patient: Eric [name] Male    DOB: 1951/05/07   69 y.o.   MRN: [id number] Visit Date: 12/24/2019  Today's Provider: Lelon Huh, MD   Chief Complaint  Patient presents with  . Bilateral pulmonary embolism   Subjective:     HPI  Follow up for Bilateral pulmonary embolism  The patient was last seen for this 2 months ago. Changes made at last visit include no change.  He reports good compliance with treatment. He feels that condition is Improved. He is not having side effects.   ------------------------------------------------------------------------------------  He has completed chemotherapy for lymphoma and has follow up in [facility] March. He is expecting to have follow up PET scan around that time.    Allergies  Allergen Reactions  . Allopurinol Rash  . Azithromycin Itching  . Losartan Potassium Hives     Current Outpatient Medications:  .  carvedilol (COREG) 6.25 MG tablet, Take 1 tablet (6.25 mg total) by mouth 2 (two) times daily., Disp: 180 tablet, Rfl: 3 .  lidocaine-prilocaine (EMLA) cream, Apply generously on the port 30-45 prior to accessing the port., Disp: 30 g, Rfl: 0 .  Multiple Vitamins-Minerals (MULTI-VITAMIN/MINERALS PO), Take 1 capsule by mouth., Disp: , Rfl:  .  rivaroxaban (XARELTO) 20 MG TABS tablet, Take 1 tablet (20 mg total) by mouth daily with supper., Disp: 30 tablet, Rfl: 3 .  UNABLE TO FIND, Med Name: CBD oil, Disp: , Rfl:   Review of Systems  Constitutional: Negative.   Respiratory: Negative.   Cardiovascular: Negative.   Musculoskeletal: Negative.     Social History   Tobacco Use  . Smoking status: Current Every Day Smoker    Packs/day: 1.00    Years: 20.00    Pack years: 20.00    Types: Cigars  . Smokeless tobacco: Never Used  . Tobacco comment: smoke 1/2 ppd cigaretes since 69 yo. changed to cigars at age July 2004.   Substance Use Topics  . Alcohol use: Yes    Comment: 6-7 drinks weekly      Objective:   BP  118/77 (BP Location: Right Arm, Patient Position: Sitting, Cuff Size: Large)   Pulse 71   Temp (!) 96 F (35.6 C) (Temporal)   Wt 287 lb 6.4 oz (130.4 kg)   BMI 38.98 kg/m  Vitals:   12/24/19 0838  BP: 118/77  Pulse: 71  Temp: (!) 96 F (35.6 C)  TempSrc: Temporal  Weight: 287 lb 6.4 oz (130.4 kg)  Body mass index is 38.98 kg/m.   Physical Exam   General: Appearance:    Obese male in no acute distress  Eyes:    PERRL, conjunctiva/corneas clear, EOM's intact       Lungs:     Clear to auscultation bilaterally, respirations unlabored  Heart:    Normal heart rate. Normal rhythm. No murmurs, rubs, or gallops.   MS:   All extremities are intact.   Neurologic:   Awake, alert, oriented x 3. No apparent focal neurological           defect.           Assessment & Plan    1. Bilateral pulmonary embolism (HCC) On 4th month of Xarelto and doing well. No adverse effects from medication. Patient anticipates follow up imaging for lymphoma at the end of March.    2. Prostate cancer screening  - PSA  3. Encounter for special screening examination for cardiovascular disorder  - Lipid panel  The entirety of the information documented in the History of Present Illness, Review of Systems and Physical Exam were personally obtained by me. Portions of this information were initially documented by Idelle Jo, CMA and reviewed by me for thoroughness and accuracy.     Lelon Huh, MD  [facility]

## 2019-12-26 ENCOUNTER — Encounter: Payer: Self-pay | Admitting: Internal Medicine

## 2019-12-28 ENCOUNTER — Ambulatory Visit: Payer: Medicare Other | Attending: Internal Medicine

## 2019-12-28 DIAGNOSIS — Z23 Encounter for immunization: Secondary | ICD-10-CM | POA: Insufficient documentation

## 2019-12-28 NOTE — Progress Notes (Signed)
   Covid-19 Vaccination Clinic  Name:  El Pensinger    MRN: [id number] DOB: 22-Sep-1951  12/28/2019  Mr. Gonya was observed post Covid-19 immunization for 15 minutes without incidence. He was provided with Vaccine Information Sheet and instruction to access the V-Safe system.   Mr. Marrin was instructed to call 911 with any severe reactions post vaccine: Marland Kitchen Difficulty breathing  . Swelling of your face and throat  . A fast heartbeat  . A bad rash all over your body  . Dizziness and weakness    Immunizations Administered    Name Date Dose VIS Date Route   Pfizer COVID-19 Vaccine 12/28/2019 10:17 AM 0.3 mL 10/12/2019 Intramuscular   Manufacturer: [place]   Lot: [id number]   [place]: [id number]

## 2020-01-15 ENCOUNTER — Encounter: Payer: Self-pay | Admitting: *Deleted

## 2020-01-15 ENCOUNTER — Other Ambulatory Visit: Payer: Self-pay

## 2020-01-15 ENCOUNTER — Inpatient Hospital Stay (HOSPITAL_BASED_OUTPATIENT_CLINIC_OR_DEPARTMENT_OTHER): Payer: Medicare Other | Admitting: Internal Medicine

## 2020-01-15 ENCOUNTER — Inpatient Hospital Stay: Payer: Medicare Other | Attending: Internal Medicine

## 2020-01-15 DIAGNOSIS — C8590 Non-Hodgkin lymphoma, unspecified, unspecified site: Secondary | ICD-10-CM | POA: Diagnosis not present

## 2020-01-15 DIAGNOSIS — Z86711 Personal history of pulmonary embolism: Secondary | ICD-10-CM | POA: Insufficient documentation

## 2020-01-15 DIAGNOSIS — C8338 Diffuse large B-cell lymphoma, lymph nodes of multiple sites: Secondary | ICD-10-CM | POA: Insufficient documentation

## 2020-01-15 DIAGNOSIS — Z95828 Presence of other vascular implants and grafts: Secondary | ICD-10-CM

## 2020-01-15 DIAGNOSIS — Z452 Encounter for adjustment and management of vascular access device: Secondary | ICD-10-CM | POA: Insufficient documentation

## 2020-01-15 DIAGNOSIS — Z7901 Long term (current) use of anticoagulants: Secondary | ICD-10-CM | POA: Insufficient documentation

## 2020-01-15 LAB — COMPREHENSIVE METABOLIC PANEL
ALT: 25 U/L (ref 0–44)
AST: 24 U/L (ref 15–41)
Albumin: 4.1 g/dL (ref 3.5–5.0)
Alkaline Phosphatase: 69 U/L (ref 38–126)
Anion gap: 10 (ref 5–15)
BUN: 12 mg/dL (ref 8–23)
CO2: 27 mmol/L (ref 22–32)
Calcium: 9.1 mg/dL (ref 8.9–10.3)
Chloride: 104 mmol/L (ref 98–111)
Creatinine, Ser: 0.78 mg/dL (ref 0.61–1.24)
GFR calc Af Amer: >60 mL/min (ref >60)
GFR calc non Af Amer: >60 mL/min (ref >60)
Glucose, Bld: 106 mg/dL — ABNORMAL HIGH (ref 70–99)
Potassium: 4.1 mmol/L (ref 3.5–5.1)
Sodium: 141 mmol/L (ref 135–145)
Total Bilirubin: 0.8 mg/dL (ref 0.3–1.2)
Total Protein: 6.5 g/dL (ref 6.5–8.1)

## 2020-01-15 LAB — CBC WITH DIFFERENTIAL/PLATELET
Abs Immature Granulocytes: 0.03 10*3/uL (ref 0.00–0.07)
Basophils Absolute: 0 10*3/uL (ref 0.0–0.1)
Basophils Relative: 1 %
Eosinophils Absolute: 0.3 10*3/uL (ref 0.0–0.5)
Eosinophils Relative: 6 %
HCT: 41.4 % (ref 39.0–52.0)
Hemoglobin: 13.9 g/dL (ref 13.0–17.0)
Immature Granulocytes: 1 %
Lymphocytes Relative: 16 %
Lymphs Abs: 0.8 10*3/uL (ref 0.7–4.0)
MCH: 30 pg (ref 26.0–34.0)
MCHC: 33.6 g/dL (ref 30.0–36.0)
MCV: 89.2 fL (ref 80.0–100.0)
Monocytes Absolute: 0.5 10*3/uL (ref 0.1–1.0)
Monocytes Relative: 8 %
Neutro Abs: 3.7 10*3/uL (ref 1.7–7.7)
Neutrophils Relative %: 68 %
Platelets: 188 10*3/uL (ref 150–400)
RBC: 4.64 MIL/uL (ref 4.22–5.81)
RDW: 16.5 % — ABNORMAL HIGH (ref 11.5–15.5)
WBC: 5.4 10*3/uL (ref 4.0–10.5)
nRBC: 0 % (ref 0.0–0.2)

## 2020-01-15 LAB — LACTATE DEHYDROGENASE: LDH: 147 U/L (ref 98–192)

## 2020-01-15 MED ORDER — SODIUM CHLORIDE 0.9% FLUSH
10.0000 mL | Freq: Once | INTRAVENOUS | Status: AC
  Administered 2020-01-15: 10 mL via INTRAVENOUS
  Filled 2020-01-15: qty 10

## 2020-01-15 MED ORDER — HEPARIN SOD (PORK) LOCK FLUSH 100 UNIT/ML IV SOLN
500.0000 [IU] | Freq: Once | INTRAVENOUS | Status: AC
  Administered 2020-01-15: 500 [IU] via INTRAVENOUS
  Filled 2020-01-15: qty 5

## 2020-01-15 NOTE — Progress Notes (Signed)
[place] NOTE  Patient Care Team: [name], MD as PCP - General (Family Medicine) [name] [name], MD (General Surgery)  CHIEF COMPLAINTS/PURPOSE OF CONSULTATION: Diffuse large B-cell lymphoma  #  Oncology History Overview Note  #June 29, 2019-CT- bulky bilateral neck adenopathy left more than right; September 2020-Diffuse B-cell lymphoma; ABC subtype; NEGATIVE FISH studies.  PET scan -bulky adenopathy involving neck /lymphadenopathy mediastinum /abdomen/pelvis inguinal lymph nodes; splenic involvement.  Question bone.  Stage IV [spleen involvement; no bone marrow biopsy].  BCL-2: Positive /BCL 6: Positive /MUM-1: Positive /CD10: Negative 'Ki-67: High proliferation index (90%) /CD30: Negative /Cyclin D1: Negative C-MYC: Equivocal (20% staining seen on limited and fragmented tissue) /EBER: Negative   # Ritux-Pred- 9/11; 9/18- R-COP [held adria- sec to EF 45-50%]  #November 2020-bilateral PE; on xarelto  # Sep 2020- Borderline low EF/Left ventricular hypokinesis [Dr.Gollan]-s October 9th stress test-nonischemic;   # DIAGNOSIS: DLBCL  STAGE:   4      ;GOALS: Cure  CURRENT/MOST RECENT THERAPY : R-COP/-R-CEOP. [C]    Lymphoma of lymph nodes ([place])  07/03/2019 Initial Diagnosis   Lymphoma of lymph nodes ([place])   07/13/2019 -  Chemotherapy   The patient had palonosetron (ALOXI) injection 0.25 mg, 0.25 mg, Intravenous,  Once, 7 of 7 cycles Administration: 0.25 mg (07/20/2019), 0.25 mg (08/13/2019), 0.25 mg (11/13/2019), 0.25 mg (09/03/2019), 0.25 mg (10/02/2019), 0.25 mg (10/23/2019), 0.25 mg (12/04/2019) pegfilgrastim (NEULASTA ONPRO KIT) injection 6 mg, 6 mg, Subcutaneous, Once, 2 of 2 cycles Administration: 6 mg (10/04/2019), 6 mg (10/25/2019) pegfilgrastim-cbqv (UDENYCA) injection 6 mg, 6 mg, Subcutaneous, Once, 5 of 5 cycles Administration: 6 mg (07/23/2019), 6 mg (08/16/2019), 6 mg (09/06/2019), 6 mg (11/16/2019), 6 mg (12/07/2019) vinCRIStine (ONCOVIN) 2 mg  in sodium chloride 0.9 % 50 mL chemo infusion, 2 mg, Intravenous,  Once, 7 of 7 cycles Administration: 2 mg (07/20/2019), 2 mg (08/13/2019), 2 mg (11/13/2019), 2 mg (09/03/2019), 2 mg (10/02/2019), 2 mg (10/23/2019), 2 mg (12/04/2019) cyclophosphamide (CYTOXAN) 1,900 mg in sodium chloride 0.9 % 250 mL chemo infusion, 750 mg/m2 = 1,900 mg, Intravenous,  Once, 7 of 7 cycles Administration: 1,900 mg (07/20/2019), 1,900 mg (08/13/2019), 2,000 mg (11/13/2019), 1,900 mg (09/03/2019), 2,000 mg (10/02/2019), 2,000 mg (10/23/2019), 2,000 mg (12/04/2019) etoposide (VEPESID) 250 mg in sodium chloride 0.9 % 1,000 mL chemo infusion, 100 mg/m2 = 250 mg, Intravenous,  Once, 6 of 6 cycles Dose modification: 50 mg/m2 (original dose 100 mg/m2, Cycle 3, Reason: Provider Judgment) Administration: 130 mg (08/14/2019), 130 mg (08/15/2019), 130 mg (09/03/2019), 130 mg (09/04/2019), 130 mg (09/05/2019), 130 mg (10/03/2019), 130 mg (10/04/2019), 130 mg (10/24/2019), 130 mg (10/25/2019), 130 mg (11/13/2019), 130 mg (11/14/2019), 130 mg (11/15/2019), 130 mg (10/02/2019), 130 mg (10/23/2019), 130 mg (12/04/2019), 130 mg (12/05/2019), 130 mg (12/06/2019) dexamethasone (DECADRON) 12 mg in sodium chloride 0.9 % 145 mL IVPB, , Intravenous,  Once, 8 of 8 cycles Administration:  (07/13/2019),  (07/20/2019),  (08/13/2019),  (11/13/2019),  (09/03/2019),  (10/02/2019),  (10/23/2019),  (12/04/2019) riTUXimab-pvvr (RUXIENCE) 900 mg in sodium chloride 0.9 % 250 mL (2.6471 mg/mL) infusion, 375 mg/m2 = 900 mg (100 % of original dose 375 mg/m2), Intravenous,  Once, 1 of 1 cycle Dose modification: 375 mg/m2 (original dose 375 mg/m2, Cycle 1) Administration: 900 mg (07/13/2019)  for chemotherapy treatment.      HISTORY OF PRESENTING ILLNESS:  Eric [name] 69 y.o.  male diffuse large B-cell lymphoma is currently status post R-CEOP #6 approximately 6weeks ago weeks ago- is here  for follow-up.  Patient feels good.  Denies any new lumps or bumps.  Appetite is good.  Gaining  weight.  No nausea no vomiting.  Noticed to have black discoloration under his left thumb nail.  Growing/turned black in the last 1 month.  No pain.  Review of Systems  Constitutional: Negative for chills, diaphoresis and fever.  HENT: Positive for hearing loss. Negative for nosebleeds and sore throat.   Eyes: Negative for double vision.  Respiratory: Negative for hemoptysis, sputum production and wheezing.   Cardiovascular: Negative for chest pain, palpitations, orthopnea and leg swelling.  Gastrointestinal: Negative for abdominal pain, blood in stool, constipation, diarrhea, heartburn, melena, nausea and vomiting.  Genitourinary: Negative for dysuria, frequency and urgency.  Musculoskeletal: Negative for back pain and joint pain.  Skin: Negative.  Negative for itching and rash.  Neurological: Negative for dizziness, tingling, focal weakness, weakness and headaches.  Endo/Heme/Allergies: Does not bruise/bleed easily.  Psychiatric/Behavioral: Negative for depression. The patient does not have insomnia.      MEDICAL HISTORY:  Past Medical History:  Diagnosis Date  . Colon polyps   . Pancreatitis   . Rectal bleeding 02/24/14  . Sepsis ([place]) 09/18/2019    SURGICAL HISTORY: Past Surgical History:  Procedure Laterality Date  . CHOLECYSTECTOMY    . COLONOSCOPY  02/28/14  . IR IMAGING GUIDED PORT INSERTION  07/18/2019  . POLYPECTOMY     colon polyp removed  . TONSILLECTOMY      SOCIAL HISTORY: Social History   Socioeconomic History  . Marital status: Married    Spouse name: Not on file  . Number of children: Not on file  . Years of education: Not on file  . Highest education level: Not on file  Occupational History    Comment: Retired from [employer]  . Smoking status: Current Every Day Smoker    Packs/day: 1.00    Years: 20.00    Pack years: 20.00    Types: Cigars  . Smokeless tobacco: Never Used  . Tobacco comment: smoke 1/2 ppd cigaretes since 69 yo.  changed to cigars at age July 2004.   Substance and Sexual Activity  . Alcohol use: Yes    Comment: 6-7 drinks weekly  . Drug use: No  . Sexual activity: Not on file  Other Topics Concern  . Not on file  Social History Narrative   Lives at home with wife; in [place]. retd from [place] [employer] smoker; drinks liquor. Children - grown up.    Social Determinants of Health   Financial Resource Strain:   . Difficulty of Paying Living Expenses:   Food Insecurity:   . Worried About Charity fundraiser in the Last Year:   . Arboriculturist in the Last Year:   Transportation Needs:   . Film/video editor (Medical):   Marland Kitchen Lack of Transportation (Non-Medical):   Physical Activity:   . Days of Exercise per Week:   . Minutes of Exercise per Session:   Stress:   . Feeling of Stress :   Social Connections:   . Frequency of Communication with Friends and Family:   . Frequency of Social Gatherings with Friends and Family:   . Attends Religious Services:   . Active Member of Clubs or Organizations:   . Attends Archivist Meetings:   Marland Kitchen Marital Status:   Intimate Partner Violence:   . Fear of Current or Ex-Partner:   . Emotionally Abused:   Marland Kitchen Physically Abused:   .  Sexually Abused:     FAMILY HISTORY: Family History  Adopted: Yes    ALLERGIES:  is allergic to allopurinol; azithromycin; and losartan potassium.  MEDICATIONS:  Current Outpatient Medications  Medication Sig Dispense Refill  . carvedilol (COREG) 6.25 MG tablet Take 1 tablet (6.25 mg total) by mouth 2 (two) times daily. 180 tablet 3  . lidocaine-prilocaine (EMLA) cream Apply generously on the port 30-45 prior to accessing the port. 30 g 0  . Multiple Vitamins-Minerals (MULTI-VITAMIN/MINERALS PO) Take 1 capsule by mouth.    . rivaroxaban (XARELTO) 20 MG TABS tablet Take 1 tablet (20 mg total) by mouth daily with supper. 30 tablet 3  . UNABLE TO FIND Med Name: CBD oil     No current facility-administered  medications for this visit.      Marland Kitchen  PHYSICAL EXAMINATION: ECOG PERFORMANCE STATUS: 1 - Symptomatic but completely ambulatory  Vitals:   01/15/20 1030  BP: 134/74  Pulse: 87  Resp: 18  Temp: (!) 97.4 F (36.3 C)  SpO2: 100%   Filed Weights   01/15/20 1030  Weight: 290 lb (131.5 kg)    Physical Exam  Constitutional: He is oriented to person, place, and time and well-developed, well-nourished, and in no distress.  HENT:  Head: Normocephalic and atraumatic.  Mouth/Throat: Oropharynx is clear and moist. No oropharyngeal exudate.  Eyes: Pupils are equal, round, and reactive to light.  Neck:  Resolved neck/axillary adenopathy.  Cardiovascular: Normal rate and regular rhythm.  Pulmonary/Chest: Effort normal and breath sounds normal. No respiratory distress. He has no wheezes.  Abdominal: Soft. Bowel sounds are normal. He exhibits no distension and no mass. There is no abdominal tenderness. There is no rebound and no guarding.  Musculoskeletal:        General: No tenderness or edema. Normal range of motion.     Cervical back: Normal range of motion and neck supple.  Neurological: He is alert and oriented to person, place, and time.  Skin: Skin is warm.  Left thumbnail/bed-0.5 cm dark pigmented lesion noted.  Psychiatric: Affect normal.   LABORATORY DATA:  I have reviewed the data as listed Lab Results  Component Value Date   WBC 5.4 01/15/2020   HGB 13.9 01/15/2020   HCT 41.4 01/15/2020   MCV 89.2 01/15/2020   PLT 188 01/15/2020   Recent Labs    11/13/19 0809 12/04/19 0801 01/15/20 1019  NA 139 140 141  K 4.0 3.7 4.1  CL 106 108 104  CO2 _0 GLUCOSE 103* 101* 106*  BUN _1 CREATININE 0.72 0.77 0.78  CALCIUM 9.1 8.9 9.1  GFRNONAA >60 >60 >60  GFRAA >60 >60 >60  PROT 6.5 6.4* 6.5  ALBUMIN 3.8 3.9 4.1  AST _2 ALT _3 ALKPHOS 70 70 69  BILITOT 0.7 0.5 0.8    RADIOGRAPHIC STUDIES: I have personally reviewed the radiological images  as listed and agreed with the findings in the report. No results found.  ASSESSMENT & PLAN:   Lymphoma of lymph nodes ([place]) #Diffuse large B-cell lymphoma-ABC subtype; IV [spleen]; currently s/p 6 cycles of RCEOP-finished late February 2021.  Stable.  Clinical response noted.  Proceed with PET scan in 2 months.  # Bil acute PE- [nov 2020]- on xarelto 20 mg/day-stable; recommend total of 1 year of anticoagulation.  # Borderline LOW EF of 45-50% [see below]- on coreg BID.  Stable  #Clinically suggestive of OSA-recommend sleep study evaluation with pulmonary.  #  left thumb- no trauma; 1 month [dark]- needs evaluation with dermatology/PCP.   # DISPOSITION:  # in 2 months- MD-labs-cbc/Cmp/ldh-port flush;PET prior-  Dr.B    All questions were answered. The patient knows to call the clinic with any problems, questions or concerns.    Cammie Sickle, MD 01/15/2020 11:12 AM

## 2020-01-15 NOTE — Assessment & Plan Note (Addendum)
#  Diffuse large B-cell lymphoma-ABC subtype; IV [spleen]; currently s/p 6 cycles of RCEOP-finished late February 2021.  Stable.  Clinical response noted.  Proceed with PET scan in 2 months.  # Bil acute PE- [nov 2020]- on xarelto 20 mg/day-stable; recommend total of 1 year of anticoagulation.  # Borderline LOW EF of 45-50% [see below]- on coreg BID.  Stable  #Clinically suggestive of OSA-recommend sleep study evaluation with pulmonary.  # left thumb- no trauma; 1 month [dark]- needs evaluation with dermatology/PCP.   # DISPOSITION:  # in 2 months- MD-labs-cbc/Cmp/ldh-port flush;PET prior-  Dr.B

## 2020-01-23 ENCOUNTER — Ambulatory Visit: Payer: Medicare Other | Attending: Internal Medicine

## 2020-01-23 DIAGNOSIS — Z23 Encounter for immunization: Secondary | ICD-10-CM

## 2020-01-23 NOTE — Progress Notes (Signed)
   Covid-19 Vaccination Clinic  Name:  Eric [name]    MRN: [id number] DOB: 1951-07-31  01/23/2020  [name] was observed post Covid-19 immunization for 30 minutes based on pre-vaccination screening without incident. He was provided with Vaccine Information Sheet and instruction to access the V-Safe system.   [name] was instructed to call 911 with any severe reactions post vaccine: Marland Kitchen Difficulty breathing  . Swelling of face and throat  . A fast heartbeat  . A bad rash all over body  . Dizziness and weakness   Immunizations Administered    Name Date Dose VIS Date Route   Pfizer COVID-19 Vaccine 01/23/2020  9:43 AM 0.3 mL 10/12/2019 Intramuscular   Manufacturer: [place]   Lot: [id number]   [place]: [id number]

## 2020-02-18 ENCOUNTER — Other Ambulatory Visit: Payer: Self-pay | Admitting: Family Medicine

## 2020-02-18 NOTE — Telephone Encounter (Signed)
Requested Prescriptions  Pending Prescriptions Disp Refills  . XARELTO 20 MG TABS tablet [Pharmacy Med Name: XARELTO 20 MG TAB] 30 tablet 3    Sig: TAKE ONE TABLET EVERY DAY WITH SUPPER     Hematology: Anticoagulants - rivaroxaban Passed - 02/18/2020 12:14 PM      Passed - ALT in normal range and within 180 days    ALT  Date Value Ref Range Status  01/15/2020 25 0 - 44 U/L Final   SGPT (ALT)  Date Value Ref Range Status  02/24/2014 39 12 - 78 U/L Final         Passed - AST in normal range and within 180 days    AST  Date Value Ref Range Status  01/15/2020 24 15 - 41 U/L Final   SGOT(AST)  Date Value Ref Range Status  02/24/2014 30 15 - 37 Unit/L Final         Passed - Cr in normal range and within 360 days    Creatinine  Date Value Ref Range Status  02/24/2014 0.89 0.60 - 1.30 mg/dL Final   Creatinine, Ser  Date Value Ref Range Status  01/15/2020 0.78 0.61 - 1.24 mg/dL Final         Passed - HCT in normal range and within 360 days    HCT  Date Value Ref Range Status  01/15/2020 41.4 39.0 - 52.0 % Final  03/01/2014 27.9 (L) 40.0 - 52.0 % Final         Passed - HGB in normal range and within 360 days    Hemoglobin  Date Value Ref Range Status  01/15/2020 13.9 13.0 - 17.0 g/dL Final   HGB  Date Value Ref Range Status  03/01/2014 9.3 (L) 13.0 - 18.0 g/dL Final         Passed - PLT in normal range and within 360 days    Platelets  Date Value Ref Range Status  01/15/2020 188 150 - 400 K/uL Final   Platelet  Date Value Ref Range Status  03/01/2014 184 150 - 440 x10 3/mm 3 Final         Passed - Valid encounter within last 12 months    Recent Outpatient Visits          1 month ago Bilateral pulmonary embolism [facility])   [facility] [name], MD   3 months ago Bilateral pulmonary embolism [facility])   [facility] [name], MD   8 months ago Epiglottitis   [facility], [place], [place]   2  years ago Subacute sinusitis, unspecified location   [place], Kirstie Peri, MD      Future Appointments            In 4 weeks Gollan, Kathlene November, MD [facility], [facility]

## 2020-02-25 ENCOUNTER — Telehealth: Payer: Self-pay | Admitting: *Deleted

## 2020-02-25 NOTE — Telephone Encounter (Signed)
Patient called reporting that he has developed RLQ Abdominal boil like eruptions that are painful, but do not itch 10 days ago. There is no specific pattern to them and there are about 6 of them. He is asking if we need to take care of this or his PCP. I told him I would message Dr B but to also call his PCP in the meantime. He was agreeabe to contact Dr Caryn Section and await a call from our office as well.

## 2020-02-26 ENCOUNTER — Encounter: Payer: Self-pay | Admitting: Family Medicine

## 2020-02-26 ENCOUNTER — Ambulatory Visit (INDEPENDENT_AMBULATORY_CARE_PROVIDER_SITE_OTHER): Payer: Medicare Other | Admitting: Family Medicine

## 2020-02-26 ENCOUNTER — Other Ambulatory Visit: Payer: Self-pay

## 2020-02-26 VITALS — BP 130/78 | HR 74 | Temp 96.9°F | Wt 295.0 lb

## 2020-02-26 DIAGNOSIS — B029 Zoster without complications: Secondary | ICD-10-CM | POA: Diagnosis not present

## 2020-02-26 DIAGNOSIS — C8590 Non-Hodgkin lymphoma, unspecified, unspecified site: Secondary | ICD-10-CM

## 2020-02-26 MED ORDER — VALACYCLOVIR HCL 1 G PO TABS: 1000.0000 mg | ORAL_TABLET | Freq: Two times a day (BID) | ORAL | 0 refills | Status: DC

## 2020-02-26 NOTE — Progress Notes (Signed)
Established Patient Office Visit  I,Elena D DeSanto,acting as a scribe for [employer], PA.,have documented all relevant documentation on the behalf of Eric Murders, PA,as directed by  [employer], PA while in the presence of [employer], [place].   Subjective:  Patient ID: Eric [name], male    DOB: [birth year]  Age: 69 y.o. MRN: [id number]  CC:  Chief Complaint  Patient presents with  . Rash    HPI Detavious Bainbridge presents for rash Rash: Patient complains of rash involving the abdomen and right side. Rash started 5 day ago. Appearance of rash at onset: Red, raised 1/2 inch size bumps. Rash has not changed over time Initial distribution: right side of the abdomen .  Discomfort associated with rash: is painful.  Associated symptoms: none.  Patient has not had new exposures (soaps, lotions, laundry detergents, foods, medications, plants, insects or animals.) Patient describes the pain as if someone has punched him.  He does report that the pain was present before the rash.  He has not noticed any blister like appearance to any of the bumps and states there is only about 5-6 bumps.  Past Medical History:  Diagnosis Date  . Colon polyps   . Pancreatitis   . Rectal bleeding 02/24/14  . Sepsis ([place]) 09/18/2019    Past Surgical History:  Procedure Laterality Date  . CHOLECYSTECTOMY    . COLONOSCOPY  02/28/14  . IR IMAGING GUIDED PORT INSERTION  07/18/2019  . POLYPECTOMY     colon polyp removed  . TONSILLECTOMY      Family History  Adopted: Yes    Social History   Socioeconomic History  . Marital status: Married    Spouse name: Not on file  . Number of children: Not on file  . Years of education: Not on file  . Highest education level: Not on file  Occupational History    Comment: Retired from [employer]  . Smoking status: Current Every Day Smoker    Packs/day: 1.00    Years: 20.00    Pack years: 20.00    Types: Cigars  . Smokeless tobacco:  Never Used  . Tobacco comment: smoke 1/2 ppd cigaretes since 69 yo. changed to cigars at age July 2004.   Substance and Sexual Activity  . Alcohol use: Yes    Comment: 6-7 drinks weekly  . Drug use: No  . Sexual activity: Not on file  Other Topics Concern  . Not on file  Social History Narrative   Lives at home with wife; in [place]. retd from [employer] smoker; drinks liquor. Children - grown up.    Social Determinants of Health   Financial Resource Strain:   . Difficulty of Paying Living Expenses:   Food Insecurity:   . Worried About Charity fundraiser in the Last Year:   . Arboriculturist in the Last Year:   Transportation Needs:   . Film/video editor (Medical):   Marland Kitchen Lack of Transportation (Non-Medical):   Physical Activity:   . Days of Exercise per Week:   . Minutes of Exercise per Session:   Stress:   . Feeling of Stress :   Social Connections:   . Frequency of Communication with Friends and Family:   . Frequency of Social Gatherings with Friends and Family:   . Attends Religious Services:   . Active Member of Clubs or Organizations:   . Attends Archivist Meetings:   Marland Kitchen Marital Status:  Intimate Partner Violence:   . Fear of Current or Ex-Partner:   . Emotionally Abused:   Marland Kitchen Physically Abused:   . Sexually Abused:     Outpatient Medications Prior to Visit  Medication Sig Dispense Refill  . carvedilol (COREG) 6.25 MG tablet Take 1 tablet (6.25 mg total) by mouth 2 (two) times daily. 180 tablet 3  . Multiple Vitamins-Minerals (MULTI-VITAMIN/MINERALS PO) Take 1 capsule by mouth.    Marland Kitchen UNABLE TO FIND Med Name: CBD oil    . XARELTO 20 MG TABS tablet TAKE ONE TABLET EVERY DAY WITH SUPPER 30 tablet 3  . lidocaine-prilocaine (EMLA) cream Apply generously on the port 30-45 prior to accessing the port. (Patient not taking: Reported on 02/26/2020) 30 g 0   No facility-administered medications prior to visit.    Allergies  Allergen Reactions  .  Allopurinol Rash  . Azithromycin Itching  . Losartan Potassium Hives    ROS Review of Systems  Constitutional: Negative for chills, fatigue and fever.  Respiratory: Negative for shortness of breath.   Gastrointestinal: Negative for nausea.  Skin: Positive for rash.       No itching      Objective:    Physical Exam  Constitutional: He is oriented to person, place, and time. He appears well-developed and well-nourished. No distress.  HENT:  Head: Normocephalic and atraumatic.  Right Ear: Hearing normal.  Left Ear: Hearing normal.  Nose: Nose normal.  Eyes: Conjunctivae and lids are normal. Right eye exhibits no discharge. Left eye exhibits no discharge. No scleral icterus.  Cardiovascular: Normal rate and regular rhythm.  Pulmonary/Chest: Effort normal and breath sounds normal. No respiratory distress.  Abdominal: Soft. Bowel sounds are normal.  Musculoskeletal:        General: Normal range of motion.  Neurological: He is alert and oriented to person, place, and time.  Skin: Skin is intact. Rash noted. No lesion noted.  Scattered clusters of blisters with surrounding erythema from the right inferior scapula, around the flank to the right upper abdomen. Tender/soreness to palpation along this dermatome.  Psychiatric: He has a normal mood and affect. His speech is normal and behavior is normal. Thought content normal.    BP 130/78 (BP Location: Right Arm, Patient Position: Sitting, Cuff Size: Normal)   Pulse 74   Temp (!) 96.9 F (36.1 C) (Skin)   Wt 295 lb (133.8 kg)   SpO2 99%   BMI 40.01 kg/m  Wt Readings from Last 3 Encounters:  02/26/20 295 lb (133.8 kg)  01/15/20 290 lb (131.5 kg)  12/24/19 287 lb 6.4 oz (130.4 kg)     Health Maintenance Due  Topic Date Due  . PNA vac Low Risk Adult (1 of 2 - PCV13) Never done    There are no preventive care reminders to display for this patient.  Lab Results  Component Value Date   TSH 1.134 09/19/2019   Lab Results   Component Value Date   WBC 5.4 01/15/2020   HGB 13.9 01/15/2020   HCT 41.4 01/15/2020   MCV 89.2 01/15/2020   PLT 188 01/15/2020   Lab Results  Component Value Date   NA 141 01/15/2020   K 4.1 01/15/2020   CO2 27 01/15/2020   GLUCOSE 106 (H) 01/15/2020   BUN 12 01/15/2020   CREATININE 0.78 01/15/2020   BILITOT 0.8 01/15/2020   ALKPHOS 69 01/15/2020   AST 24 01/15/2020   ALT 25 01/15/2020   PROT 6.5 01/15/2020   ALBUMIN 4.1 01/15/2020  CALCIUM 9.1 01/15/2020   ANIONGAP 10 01/15/2020      Assessment & Plan:   Problem List Items Addressed This Visit    1. Herpes zoster without complication Onset of a tender blistery rash on the right lower thoracic dermatome to the RUQ of the abdomen over the past 5 days. Started as a soreness a week ago. Will treat with Valtrex and he will notify his oncologist of this finding to be sure it is not a flare of his lymphoma. - valACYclovir (VALTREX) 1000 MG tablet; Take 1 tablet (1,000 mg total) by mouth 2 (two) times daily.  Dispense: 20 tablet; Refill: 0  2. Lymphoma of lymph nodes ([place]) Has finished chemotherapy per Dr. Rogue Bussing (oncologist) and scheduled for follow up PET scan on 03-18-20.       No orders of the defined types were placed in this encounter.   Follow-up: No follow-ups on file.    Andres Shad, PA, have reviewed all documentation for this visit. The documentation on 02/26/20 for the exam, diagnosis, procedures, and orders are all accurate and complete.

## 2020-02-26 NOTE — Telephone Encounter (Signed)
Pt evaluated by pcp. Dr. B aware.

## 2020-02-26 NOTE — Patient Instructions (Signed)
Shingles  Shingles, which is also known as herpes zoster, is an infection that causes a painful skin rash and fluid-filled blisters. It is caused by a virus. Shingles only develops in people who:  Have had chickenpox.  Have been given a medicine to protect against chickenpox (have been vaccinated). Shingles is rare in this group. What are the causes? Shingles is caused by varicella-zoster virus (VZV). This is the same virus that causes chickenpox. After a person is exposed to VZV, the virus stays in the body in an inactive (dormant) state. Shingles develops if the virus is reactivated. This can happen many years after the first (initial) exposure to VZV. It is not known what causes this virus to be reactivated. What increases the risk? People who have had chickenpox or received the chickenpox vaccine are at risk for shingles. Shingles infection is more common in people who:  Are older than age 60.  Have a weakened disease-fighting system (immune system), such as people with: ? HIV. ? AIDS. ? Cancer.  Are taking medicines that weaken the immune system, such as transplant medicines.  Are experiencing a lot of stress. What are the signs or symptoms? Early symptoms of this condition include itching, tingling, and pain in an area on your skin. Pain may be described as burning, stabbing, or throbbing. A few days or weeks after early symptoms start, a painful red rash appears. The rash is usually on one side of the body and has a band-like or belt-like pattern. The rash eventually turns into fluid-filled blisters that break open, change into scabs, and dry up in about 2-3 weeks. At any time during the infection, you may also develop:  A fever.  Chills.  A headache.  An upset stomach. How is this diagnosed? This condition is diagnosed with a skin exam. Skin or fluid samples may be taken from the blisters before a diagnosis is made. These samples are examined under a microscope or sent to  a lab for testing. How is this treated? The rash may last for several weeks. There is not a specific cure for this condition. Your health care provider will probably prescribe medicines to help you manage pain, recover more quickly, and avoid long-term problems. Medicines may include:  Antiviral drugs.  Anti-inflammatory drugs.  Pain medicines.  Anti-itching medicines (antihistamines). If the area involved is on your face, you may be referred to a specialist, such as an eye doctor (ophthalmologist) or an ear, nose, and throat (ENT) doctor (otolaryngologist) to help you avoid eye problems, chronic pain, or disability. Follow these instructions at home: Medicines  Take over-the-counter and prescription medicines only as told by your health care provider.  Apply an anti-itch cream or numbing cream to the affected area as told by your health care provider. Relieving itching and discomfort   Apply cold, wet cloths (cold compresses) to the area of the rash or blisters as told by your health care provider.  Cool baths can be soothing. Try adding baking soda or dry oatmeal to the water to reduce itching. Do not bathe in hot water. Blister and rash care  Keep your rash covered with a loose bandage (dressing). Wear loose-fitting clothing to help ease the pain of material rubbing against the rash.  Keep your rash and blisters clean by washing the area with mild soap and cool water as told by your health care provider.  Check your rash every day for signs of infection. Check for: ? More redness, swelling, or pain. ? Fluid   or blood. ? Warmth. ? Pus or a bad smell.  Do not scratch your rash or pick at your blisters. To help avoid scratching: ? Keep your fingernails clean and cut short. ? Wear gloves or mittens while you sleep, if scratching is a problem. General instructions  Rest as told by your health care provider.  Keep all follow-up visits as told by your health care provider. This  is important.  Wash your hands often with soap and water. If soap and water are not available, use hand sanitizer. Doing this lowers your chance of getting a bacterial skin infection.  Before your blisters change into scabs, your shingles infection can cause chickenpox in people who have never had it or have never been vaccinated against it. To prevent this from happening, avoid contact with other people, especially: ? Babies. ? Pregnant women. ? Children who have eczema. ? Elderly people who have transplants. ? People who have chronic illnesses, such as cancer or AIDS. Contact a health care provider if:  Your pain is not relieved with prescribed medicines.  Your pain does not get better after the rash heals.  You have signs of infection in the rash area, such as: ? More redness, swelling, or pain around the rash. ? Fluid or blood coming from the rash. ? The rash area feeling warm to the touch. ? Pus or a bad smell coming from the rash. Get help right away if:  The rash is on your face or nose.  You have facial pain, pain around your eye area, or loss of feeling on one side of your face.  You have difficulty seeing.  You have ear pain or have ringing in your ear.  You have a loss of taste.  Your condition gets worse. Summary  Shingles, which is also known as herpes zoster, is an infection that causes a painful skin rash and fluid-filled blisters.  This condition is diagnosed with a skin exam. Skin or fluid samples may be taken from the blisters and examined before the diagnosis is made.  Keep your rash covered with a loose bandage (dressing). Wear loose-fitting clothing to help ease the pain of material rubbing against the rash.  Before your blisters change into scabs, your shingles infection can cause chickenpox in people who have never had it or have never been vaccinated against it. This information is not intended to replace advice given to you by your health care  provider. Make sure you discuss any questions you have with your health care provider. Document Revised: 02/09/2019 Document Reviewed: 06/22/2017 Elsevier Patient Education  2020 Elsevier Inc.  

## 2020-03-10 ENCOUNTER — Other Ambulatory Visit: Payer: Self-pay | Admitting: Internal Medicine

## 2020-03-10 MED ORDER — LIDOCAINE-PRILOCAINE 2.5-2.5 % EX CREA: TOPICAL_CREAM | CUTANEOUS | 1 refills | Status: DC

## 2020-03-11 ENCOUNTER — Ambulatory Visit (INDEPENDENT_AMBULATORY_CARE_PROVIDER_SITE_OTHER): Payer: Medicare Other | Admitting: Family Medicine

## 2020-03-11 ENCOUNTER — Ambulatory Visit: Payer: Self-pay | Admitting: *Deleted

## 2020-03-11 ENCOUNTER — Encounter: Payer: Self-pay | Admitting: Family Medicine

## 2020-03-11 DIAGNOSIS — A09 Infectious gastroenteritis and colitis, unspecified: Secondary | ICD-10-CM | POA: Diagnosis not present

## 2020-03-11 NOTE — Telephone Encounter (Signed)
Per initial encounter, "Patient requesting to speak with RN regarding diarrhea. Patient states that he feels it is related to the medication valtrex."; he finished his Valtex on 03/07/20; 36 hours after taking last dose of meds he started having chills; he started having diarrhea on 03/08/20; the pt says he has app 6 stools within a 24 hours period, and happens every days since 03/09/20; recommendations made pt per nurse triage protocol; decision tree completed; pt offered and accepted virtual appt with Dr Lelon Huh, [facility], 03/11/20 at 1520; he verbalized understanding; will route to office for notification.  Reason for Disposition . [1] MODERATE diarrhea (e.g., 4-6 times / day more than normal) AND [2] present > 48 hours (2 days)  Answer Assessment - Initial Assessment Questions 1. DIARRHEA SEVERITY: "How bad is the diarrhea?" "How many extra stools have you had in the past 24 hours than normal?"    - NO DIARRHEA (SCALE 0)   - MILD (SCALE 1-3): Few loose or mushy BMs; increase of 1-3 stools over normal daily number of stools; mild increase in ostomy output.   -  MODERATE (SCALE 4-7): Increase of 4-6 stools daily over normal; moderate increase in ostomy output. * SEVERE (SCALE 8-10; OR 'WORST POSSIBLE'): Increase of 7 or more stools daily over normal; moderate increase in ostomy output; incontinence.     6 in each 24 hour period 2. ONSET: "When did the diarrhea begin?"      03/08/20 3. BM CONSISTENCY: "How loose or watery is the diarrhea?"     watery 4. VOMITING: "Are you also vomiting?" If so, ask: "How many times in the past 24 hours?"      Beginnings of nausea 5. ABDOMINAL PAIN: "Are you having any abdominal pain?" If yes: "What does it feel like?" (e.g., crampy, dull, intermittent, constant)      no 6. ABDOMINAL PAIN SEVERITY: If present, ask: "How bad is the pain?"  (e.g., Scale 1-10; mild, moderate, or severe)   - MILD (1-3): doesn't interfere with normal activities, abdomen soft  and not tender to touch    - MODERATE (4-7): interferes with normal activities or awakens from sleep, tender to touch    - SEVERE (8-10): excruciating pain, doubled over, unable to do any normal activities       n/a 7. ORAL INTAKE: If vomiting, "Have you been able to drink liquids?" "How much fluids have you had in the past 24 hours?"    32 oz at most 8. HYDRATION: "Any signs of dehydration?" (e.g., dry mouth [not just dry lips], too weak to stand, dizziness, new weight loss) "When did you last urinate?"     "Tired/washed out"; last urinated 03/11/20 at 0700 "dark orangish yellow" 9. EXPOSURE: "Have you traveled to a foreign country recently?" "Have you been exposed to anyone with diarrhea?" "Could you have eaten any food that was spoiled?"     no 10. ANTIBIOTIC USE: "Are you taking antibiotics now or have you taken antibiotics in the past 2 months?"       No; did take last dose of valtrex 03/06/20 11. OTHER SYMPTOMS: "Do you have any other symptoms?" (e.g., fever, blood in stool)       no 12. PREGNANCY: "Is there any chance you are pregnant?" "When was your last menstrual period?"       n/a  Protocols used: DIARRHEA-A-AH

## 2020-03-11 NOTE — Progress Notes (Signed)
Virtual telephone visit    Virtual Visit via Telephone Note   This visit type was conducted due to national recommendations for restrictions regarding the COVID-19 Pandemic (e.g. social distancing) in an effort to limit this patient's exposure and mitigate transmission in our community. Due to his co-morbid illnesses, this patient is at least at moderate risk for complications without adequate follow up. This format is felt to be most appropriate for this patient at this time. The patient did not have access to video technology or had technical difficulties with video requiring transitioning to audio format only (telephone). Physical exam was limited to content and character of the telephone converstion.    Patient location: home Provider location: [facility]   Visit Date: 03/11/2020  Today's healthcare provider: Lelon Huh, MD   Chief Complaint  Patient presents with  . Diarrhea   Mertie Moores as a scribe for Lelon Huh, MD.,have documented all relevant documentation on the behalf of Lelon Huh, MD,as directed by  Lelon Huh, MD while in the presence of Lelon Huh, MD.  Subjective    Diarrhea  This is a new problem. Episode onset: 03/08/2020. The problem occurs 5 to 10 times per day. The problem has been unchanged. Associated symptoms include chills. Associated symptoms comments: Nausea-mild. Risk factors: recent Valtrex use. He has tried nothing for the symptoms.  Has some chills and shakes the first night. No blood in stool. No fevers. No abdominal pain. Took one dose of Imodium today.       Medications: Outpatient Medications Prior to Visit  Medication Sig  . carvedilol (COREG) 6.25 MG tablet Take 1 tablet (6.25 mg total) by mouth 2 (two) times daily.  Marland Kitchen lidocaine-prilocaine (EMLA) cream Apply generously on the port 30-45 prior to accessing the port.  . Multiple Vitamins-Minerals (MULTI-VITAMIN/MINERALS PO) Take 1 capsule by mouth.  Marland Kitchen UNABLE TO FIND  Med Name: CBD oil  . valACYclovir (VALTREX) 1000 MG tablet Take 1 tablet (1,000 mg total) by mouth 2 (two) times daily.  Alveda Reasons 20 MG TABS tablet TAKE ONE TABLET EVERY DAY WITH SUPPER   No facility-administered medications prior to visit.    Review of Systems  Constitutional: Positive for chills.  Respiratory: Negative.   Cardiovascular: Negative.   Gastrointestinal: Positive for diarrhea and nausea.       Objective    There were no vitals taken for this visit.   Awake, alert, oriented x 3. In no apparent distress    Assessment & Plan    1. Diarrhea of infectious origin Discussed various infections including viruses, parasites, bacteria and c. Diff. Discussed empiric trial of metronidazole. He agrees to wait an additional day to see if symptoms start improving. He will call or send MyChart message tomorrow if not improved in which case we will send prescription for empiric metronidazole. If any blood in stool, fever, chills, or cramping will order stool studies   No follow-ups on file.    I discussed the assessment and treatment plan with the patient. The patient was provided an opportunity to ask questions and all were answered. The patient agreed with the plan and demonstrated an understanding of the instructions.   The patient was advised to call back or seek an in-person evaluation if the symptoms worsen or if the condition fails to improve as anticipated.  The entirety of the information documented in the History of Present Illness, Review of Systems and Physical Exam were personally obtained by me. Portions of this information were initially  documented by the CMA and reviewed by me for thoroughness and accuracy.    I provided 9 minutes of non-face-to-face time during this encounter.   Lelon Huh, MD [facility] [phone] (phone) [phone] (fax)  [place]

## 2020-03-17 NOTE — Progress Notes (Signed)
Cardiology Office Note  Date:  03/18/2020   ID:  Dorvin, Krebs [birth year], MRN [id number]  PCP:  [name], MD   Chief Complaint  Patient presents with  . OTher    6 month follow up. Meds reviewed verbally with patient.     HPI:  [name] is a 69 year old gentleman with past medical history of Smoker, 20+ years diabetes Chronic lower extremity edema Diffuse large B-cell lymphoma, on prednisone Rituxan cardiomyopathy, ejection fraction 45 to 50% on echocardiogram September 2020 Follow-up echocardiogram November 2020 ejection fraction 55 to 60% started on t R-COP hospital November 2020 for pulmonary embolism Presents for follow-up of his cardiomyopathy,  DVT and bilateral PE with respiratory distress  Overall feels well no complaints Had a vaccine Smokes cigars,  Sits on porch No regular exercise Weight has been trending upwards, poor diet  Has follow-up with hematology oncology PET scan scheduled  Chronic lower extremity swelling right greater than left Does not wear compression hose  Works at [employer], 2 miles at work Other days no walking  Still taking Xarelto 20 daily  EKG personally reviewed by myself on todays visit Shows normal sinus rhythm with rate 68 bpm no significant ST-T wave changes PVC  Other past medical history reviewed Adriamycin was previously not started secondary  to mildly depressed EF of 45-50% Adriamycin substitured for etoposide   Seen in the hospital November 2020 for pulmonary embolism Started on Xarelto 15 twice daily changed to 20 mg daily December 10  Reports that he had a massage, shortly after he developed severe sharp left-sided pleuritic chest pain as well as shortness of breath  Seen in the hospital, was hypoxic requiring 4 L nasal cannula CT scan was positive for bilateral pulmonary embolus.    Lower extremity venous Doppler results reviewed with him 1. Nonocclusive thrombus in the right popliteal vein,  right posterior tibial vein and right peroneal vein.  Echo reviewed with him  1. Left ventricular ejection fraction, by visual estimation, is 55 to 60%. The left ventricle has normal function. There is mildly increased left ventricular hypertrophy.  2. Left ventricular diastolic parameters are consistent with Grade I diastolic dysfunction (impaired relaxation).  3. Global right ventricle has mildly reduced systolic function.The right ventricular size is moderately enlarged. No increase in right ventricular wall thickness.  CT scan reviewed with him   positive for acute pulmonary emboli throughout the lobar and proximal segmental pulmonary arteries 5 lobes of the lungs with CT evidence of right heart strain (RV/LV Ratio = 1.06) consistent with at least submassive (intermediate risk) PE.   Reports his breathing is much improved, able to ambulate without significant symptoms Tolerating Xarelto 15 twice daily Scheduled to decrease down to 20 daily on December 10  Chronic lower extremity edema, does not wear compression hose  Other past medical history reviewed Echo 07/16/19: [facility]  1. The left ventricle has mildly reduced systolic function, with an ejection fraction of 45-50%.   June 29, 2019-CT- bulky bilateral neck adenopathy left more than right;  6 x 4 x 4 cm mass at the tongue base/vallecula with displacement of the epiglottis and marked encroachment upon the oropharynx.  September 2020-Diffuse B-cell lymphoma; ABC subtype; NEGATIVE FISH studies.    PET scan -bulky adenopathy involving neck /lymphadenopathy mediastinum /abdomen/pelvis inguinal lymph nodes; splenic involvement.    CT ABD 2015: Scattered mixed calcified and noncalcified atherosclerotic plaque  within a normal caliber abdominal aorta.  Lab work reviewed with him in  detail HBG 11.2, up from 9.1    PMH:   has a past medical history of Colon polyps, Pancreatitis, Rectal bleeding (02/24/14), and Sepsis ([place])  (09/18/2019).  PSH:    Past Surgical History:  Procedure Laterality Date  . CHOLECYSTECTOMY    . COLONOSCOPY  02/28/14  . IR IMAGING GUIDED PORT INSERTION  07/18/2019  . POLYPECTOMY     colon polyp removed  . TONSILLECTOMY      Current Outpatient Medications  Medication Sig Dispense Refill  . carvedilol (COREG) 6.25 MG tablet Take 1 tablet (6.25 mg total) by mouth 2 (two) times daily. 180 tablet 3  . lidocaine-prilocaine (EMLA) cream Apply generously on the port 30-45 prior to accessing the port. 30 g 1  . Multiple Vitamins-Minerals (MULTI-VITAMIN/MINERALS PO) Take 1 capsule by mouth.    Marland Kitchen UNABLE TO FIND Med Name: CBD oil    . XARELTO 20 MG TABS tablet TAKE ONE TABLET EVERY DAY WITH SUPPER 30 tablet 3   No current facility-administered medications for this visit.     Allergies:   Allopurinol, Azithromycin, and Losartan potassium   Social History:  The patient  reports that he has been smoking cigars. He has a 20.00 pack-year smoking history. He has never used smokeless tobacco. He reports current alcohol use. He reports that he does not use drugs.   Family History:   family history is not on file. He was adopted.    Review of Systems: Review of Systems  Constitutional: Negative.   HENT: Negative.   Respiratory: Positive for shortness of breath.   Cardiovascular: Positive for leg swelling.  Gastrointestinal: Negative.   Musculoskeletal: Negative.   Neurological: Negative.   Psychiatric/Behavioral: Negative.   All other systems reviewed and are negative.   PHYSICAL EXAM: VS:  BP 130/70 (BP Location: Left Arm, Patient Position: Sitting, Cuff Size: Large)   Pulse 68   Ht 6' (1.829 m)   Wt 291 lb (132 kg)   SpO2 98%   BMI 39.47 kg/m  , BMI Body mass index is 39.47 kg/m. Constitutional:  oriented to person, place, and time. No distress.  HENT:  Head: Grossly normal Eyes:  no discharge. No scleral icterus.  Neck: No JVD, no carotid bruits  Cardiovascular: Regular  rate and rhythm, no murmurs appreciated 1+ minimally pitting lower extremity edema bilaterally Pulmonary/Chest: Clear to auscultation bilaterally, no wheezes or rails Abdominal: Soft.  no distension.  no tenderness.  Musculoskeletal: Normal range of motion Neurological:  normal muscle tone. Coordination normal. No atrophy Skin: Skin warm and dry Psychiatric: normal affect, pleasant   Recent Labs: 09/18/2019: B Natriuretic Peptide 43.0 09/19/2019: Magnesium 2.0; TSH 1.134 01/15/2020: ALT 25; BUN 12; Creatinine, Ser 0.78; Hemoglobin 13.9; Platelets 188; Potassium 4.1; Sodium 141    Lipid Panel No results found for: CHOL, HDL, LDLCALC, TRIG    Wt Readings from Last 3 Encounters:  03/18/20 291 lb (132 kg)  02/26/20 295 lb (133.8 kg)  01/15/20 290 lb (131.5 kg)       ASSESSMENT AND PLAN:  Problem List Items Addressed This Visit      Cardiology Problems   Dilated cardiomyopathy ([place]) - Primary   Relevant Orders   EKG 12-Lead     Other   Lymphoma of lymph nodes ([place])   Morbid obesity ([place])   Compulsive tobacco user syndrome    Other Visit Diagnoses    SOB (shortness of breath)       Suspected sleep apnea  Cardiomyopathy Etiology unclear, Recent echocardiogram with improved ejection fraction 55 to 60% Previous intolerance of losartan Continue carvedilol  Lymphoma Has follow-up with hematology oncology  DVT/PE bilateral Would recommend Xarelto 10 mg daily  he will talk with hematology oncology  Hypertension Continue Coreg 6.25 twice daily  Smoker Encourage smoking cessation  Aortic atherosclerosis Seen on CT scan in 2015, mild in nature descending distal aorta Discussed getting a lipid panel, he will go to Dr. Caryn Section and have this done as order is pending LDL less than 70 would be his goal  Lower extremity edema Previous DVT, likely component of lymphedema, recommended compression hose and legs up when sitting   Disposition:   F/U  12 months    Total encounter time more than 25 minutes  Greater than 50% was spent in counseling and coordination of care with the patient   Signed, Esmond Plants, M.D., Ph.D. [place], [place]

## 2020-03-18 ENCOUNTER — Ambulatory Visit (INDEPENDENT_AMBULATORY_CARE_PROVIDER_SITE_OTHER): Payer: Medicare Other | Admitting: Cardiovascular Disease

## 2020-03-18 ENCOUNTER — Other Ambulatory Visit: Payer: Self-pay

## 2020-03-18 ENCOUNTER — Encounter: Payer: Self-pay | Admitting: Cardiovascular Disease

## 2020-03-18 ENCOUNTER — Encounter: Payer: Self-pay | Admitting: Internal Medicine

## 2020-03-18 VITALS — BP 130/70 | HR 68 | Ht 72.0 in | Wt 291.0 lb

## 2020-03-18 DIAGNOSIS — F17200 Nicotine dependence, unspecified, uncomplicated: Secondary | ICD-10-CM | POA: Diagnosis not present

## 2020-03-18 DIAGNOSIS — C8590 Non-Hodgkin lymphoma, unspecified, unspecified site: Secondary | ICD-10-CM | POA: Diagnosis not present

## 2020-03-18 DIAGNOSIS — I420 Dilated cardiomyopathy: Secondary | ICD-10-CM | POA: Diagnosis not present

## 2020-03-18 DIAGNOSIS — R0602 Shortness of breath: Secondary | ICD-10-CM | POA: Diagnosis not present

## 2020-03-18 DIAGNOSIS — R29818 Other symptoms and signs involving the nervous system: Secondary | ICD-10-CM

## 2020-03-18 NOTE — Patient Instructions (Addendum)
Medication Instructions:  No changes  Talk with Dr. Jacinto Reap about reducing xarelto to 10 mg daily  If you need a refill on your cardiac medications before your next appointment, please call your pharmacy.    Lab work: No new labs needed   If you have labs (blood work) drawn today and your tests are completely normal, you will receive your results only by: Marland Kitchen MyChart Message (if you have MyChart) OR . A paper copy in the mail If you have any lab test that is abnormal or we need to change your treatment, we will call you to review the results.   Testing/Procedures: No new testing needed   Follow-Up: At [facility], you and your health needs are our priority.  As part of our continuing mission to provide you with exceptional heart care, we have created designated Provider Care Teams.  These Care Teams include your primary Cardiologist (physician) and Advanced Practice Providers (APPs -  Physician Assistants and Nurse Practitioners) who all work together to provide you with the care you need, when you need it.  . You will need a follow up appointment in 12 months   . Providers on your designated Care Team:   . Murray Hodgkins, NP . Christell Faith, PA-C . Marrianne Mood, PA-C  Any Other Special Instructions Will Be Listed Below (If Applicable).  For educational health videos Log in to : [email or web address] Or : [email or web address], password : [email or web address]

## 2020-03-19 ENCOUNTER — Encounter: Payer: Self-pay | Admitting: Internal Medicine

## 2020-03-19 ENCOUNTER — Encounter
Admission: RE | Admit: 2020-03-19 | Discharge: 2020-03-19 | Disposition: A | Discharge status: Home / Self Care | Payer: Medicare Other | Source: Ambulatory Visit | Attending: Internal Medicine | Admitting: Internal Medicine

## 2020-03-19 ENCOUNTER — Other Ambulatory Visit: Payer: Self-pay

## 2020-03-19 ENCOUNTER — Other Ambulatory Visit: Payer: Self-pay | Admitting: *Deleted

## 2020-03-19 DIAGNOSIS — C8590 Non-Hodgkin lymphoma, unspecified, unspecified site: Secondary | ICD-10-CM | POA: Diagnosis not present

## 2020-03-19 DIAGNOSIS — Z7901 Long term (current) use of anticoagulants: Secondary | ICD-10-CM | POA: Insufficient documentation

## 2020-03-19 DIAGNOSIS — Z79899 Other long term (current) drug therapy: Secondary | ICD-10-CM | POA: Insufficient documentation

## 2020-03-19 DIAGNOSIS — N200 Calculus of kidney: Secondary | ICD-10-CM | POA: Diagnosis not present

## 2020-03-19 DIAGNOSIS — K5730 Diverticulosis of large intestine without perforation or abscess without bleeding: Secondary | ICD-10-CM | POA: Diagnosis not present

## 2020-03-19 DIAGNOSIS — I700 Atherosclerosis of aorta: Secondary | ICD-10-CM | POA: Insufficient documentation

## 2020-03-19 LAB — GLUCOSE, CAPILLARY: Glucose-Capillary: 89 mg/dL (ref 70–99)

## 2020-03-19 MED ORDER — RIVAROXABAN 10 MG PO TABS
10.0000 mg | ORAL_TABLET | Freq: Every day | ORAL | 1 refills | Status: DC
Start: 2020-03-19 — End: 2020-04-21

## 2020-03-19 MED ORDER — FLUDEOXYGLUCOSE F - 18 (FDG) INJECTION
15.1000 | Freq: Once | INTRAVENOUS | Status: AC | PRN
  Administered 2020-03-19: 15.2 via INTRAVENOUS

## 2020-03-20 ENCOUNTER — Inpatient Hospital Stay (HOSPITAL_BASED_OUTPATIENT_CLINIC_OR_DEPARTMENT_OTHER): Payer: Medicare Other | Admitting: Internal Medicine

## 2020-03-20 ENCOUNTER — Inpatient Hospital Stay: Payer: Medicare Other | Attending: Internal Medicine

## 2020-03-20 DIAGNOSIS — C8590 Non-Hodgkin lymphoma, unspecified, unspecified site: Secondary | ICD-10-CM

## 2020-03-20 DIAGNOSIS — Z95828 Presence of other vascular implants and grafts: Secondary | ICD-10-CM

## 2020-03-20 DIAGNOSIS — Z7901 Long term (current) use of anticoagulants: Secondary | ICD-10-CM | POA: Insufficient documentation

## 2020-03-20 DIAGNOSIS — C8338 Diffuse large B-cell lymphoma, lymph nodes of multiple sites: Secondary | ICD-10-CM | POA: Diagnosis not present

## 2020-03-20 DIAGNOSIS — Z86711 Personal history of pulmonary embolism: Secondary | ICD-10-CM | POA: Insufficient documentation

## 2020-03-20 DIAGNOSIS — Z452 Encounter for adjustment and management of vascular access device: Secondary | ICD-10-CM | POA: Diagnosis not present

## 2020-03-20 LAB — COMPREHENSIVE METABOLIC PANEL
ALT: 31 U/L (ref 0–44)
AST: 23 U/L (ref 15–41)
Albumin: 3.8 g/dL (ref 3.5–5.0)
Alkaline Phosphatase: 70 U/L (ref 38–126)
Anion gap: 9 (ref 5–15)
BUN: 13 mg/dL (ref 8–23)
CO2: 28 mmol/L (ref 22–32)
Calcium: 8.6 mg/dL — ABNORMAL LOW (ref 8.9–10.3)
Chloride: 105 mmol/L (ref 98–111)
Creatinine, Ser: 0.81 mg/dL (ref 0.61–1.24)
GFR calc Af Amer: >60 mL/min (ref >60)
GFR calc non Af Amer: >60 mL/min (ref >60)
Glucose, Bld: 134 mg/dL — ABNORMAL HIGH (ref 70–99)
Potassium: 3.6 mmol/L (ref 3.5–5.1)
Sodium: 142 mmol/L (ref 135–145)
Total Bilirubin: 0.7 mg/dL (ref 0.3–1.2)
Total Protein: 5.9 g/dL — ABNORMAL LOW (ref 6.5–8.1)

## 2020-03-20 LAB — CBC WITH DIFFERENTIAL/PLATELET
Abs Immature Granulocytes: 0.09 10*3/uL — ABNORMAL HIGH (ref 0.00–0.07)
Basophils Absolute: 0 10*3/uL (ref 0.0–0.1)
Basophils Relative: 1 %
Eosinophils Absolute: 0.2 10*3/uL (ref 0.0–0.5)
Eosinophils Relative: 4 %
HCT: 39 % (ref 39.0–52.0)
Hemoglobin: 13.5 g/dL (ref 13.0–17.0)
Immature Granulocytes: 2 %
Lymphocytes Relative: 16 %
Lymphs Abs: 1 10*3/uL (ref 0.7–4.0)
MCH: 30.7 pg (ref 26.0–34.0)
MCHC: 34.6 g/dL (ref 30.0–36.0)
MCV: 88.6 fL (ref 80.0–100.0)
Monocytes Absolute: 0.5 10*3/uL (ref 0.1–1.0)
Monocytes Relative: 8 %
Neutro Abs: 4.1 10*3/uL (ref 1.7–7.7)
Neutrophils Relative %: 69 %
Platelets: 173 10*3/uL (ref 150–400)
RBC: 4.4 MIL/uL (ref 4.22–5.81)
RDW: 15.9 % — ABNORMAL HIGH (ref 11.5–15.5)
WBC: 5.9 10*3/uL (ref 4.0–10.5)
nRBC: 0 % (ref 0.0–0.2)

## 2020-03-20 LAB — LACTATE DEHYDROGENASE: LDH: 139 U/L (ref 98–192)

## 2020-03-20 MED ORDER — SODIUM CHLORIDE 0.9% FLUSH
10.0000 mL | INTRAVENOUS | Status: DC | PRN
  Administered 2020-03-20: 10 mL via INTRAVENOUS
  Filled 2020-03-20: qty 10

## 2020-03-20 MED ORDER — HEPARIN SOD (PORK) LOCK FLUSH 100 UNIT/ML IV SOLN
500.0000 [IU] | Freq: Once | INTRAVENOUS | Status: AC
  Administered 2020-03-20: 500 [IU] via INTRAVENOUS
  Filled 2020-03-20: qty 5

## 2020-03-20 NOTE — Assessment & Plan Note (Addendum)
#  Diffuse large B-cell lymphoma-ABC subtype; IV [spleen]; currently s/p 6 cycles of RCEOP-finished late February 2021; worsening- see below  # PET scan may 19th,2021-left neck new lymphadenopathy SUV 5/upper 12 mm in size concerning for recurrence.  However I would recommend biopsy to confirm recurrence/histology.  Discussed with Dr. Shari Prows agrees to a excisional lymph node biopsy.  Discussed with the patient that if confirmed-recurrent disease he would need to go through intensive chemotherapy.   # Bil acute PE- [nov 2020]-currently on xarelto 10 mg/day/cardiology; discussed with the patient that he will need to stop Xarelto 3 days prior to procedure.  He will inform us once the biopsy is scheduled.  # Borderline LOW EF of 45-50% [see below]; Sep Echo 50-55% [Dr.Gollan]- on coreg BID.  STABLE.   # DISPOSITION:  # follow up TBD- Dr.B  # I reviewed the blood work- with the patient in detail; also reviewed the imaging independently [as summarized above]; and with the patient in detail.

## 2020-03-20 NOTE — Progress Notes (Signed)
[facility] NOTE  Patient Care Team: [name], MD as PCP - General (Family Medicine) [name] [name], MD (General Surgery)  CHIEF COMPLAINTS/PURPOSE OF CONSULTATION: Diffuse large B-cell lymphoma  #  Oncology History Overview Note  #June 29, 2019-CT- bulky bilateral neck adenopathy left more than right; September 2020-Diffuse B-cell lymphoma; ABC subtype; NEGATIVE FISH studies.  PET scan -bulky adenopathy involving neck /lymphadenopathy mediastinum /abdomen/pelvis inguinal lymph nodes; splenic involvement.  Question bone.  Stage IV [spleen involvement; no bone marrow biopsy].  BCL-2: Positive /BCL 6: Positive /MUM-1: Positive /CD10: Negative 'Ki-67: High proliferation index (90%) /CD30: Negative /Cyclin D1: Negative C-MYC: Equivocal (20% staining seen on limited and fragmented tissue) /EBER: Negative   # Ritux-Pred- 9/11; 9/18- R-COP [held adria- sec to EF 45-50%]  #November 2020-bilateral PE; on xarelto  # Sep 2020- Borderline low EF/Left ventricular hypokinesis [Dr.Gollan]-s October 9th stress test-nonischemic;   # DIAGNOSIS: DLBCL  STAGE:   4      ;GOALS: Cure  CURRENT/MOST RECENT THERAPY : R-COP/-R-CEOP. [C]    Lymphoma of lymph nodes ([place])  07/03/2019 Initial Diagnosis   Lymphoma of lymph nodes ([place])   07/13/2019 -  Chemotherapy   The patient had palonosetron (ALOXI) injection 0.25 mg, 0.25 mg, Intravenous,  Once, 7 of 7 cycles Administration: 0.25 mg (07/20/2019), 0.25 mg (08/13/2019), 0.25 mg (11/13/2019), 0.25 mg (09/03/2019), 0.25 mg (10/02/2019), 0.25 mg (10/23/2019), 0.25 mg (12/04/2019) pegfilgrastim (NEULASTA ONPRO KIT) injection 6 mg, 6 mg, Subcutaneous, Once, 2 of 2 cycles Administration: 6 mg (10/04/2019), 6 mg (10/25/2019) pegfilgrastim-cbqv (UDENYCA) injection 6 mg, 6 mg, Subcutaneous, Once, 5 of 5 cycles Administration: 6 mg (07/23/2019), 6 mg (08/16/2019), 6 mg (09/06/2019), 6 mg (11/16/2019), 6 mg (12/07/2019) vinCRIStine (ONCOVIN) 2 mg  in sodium chloride 0.9 % 50 mL chemo infusion, 2 mg, Intravenous,  Once, 7 of 7 cycles Administration: 2 mg (07/20/2019), 2 mg (08/13/2019), 2 mg (11/13/2019), 2 mg (09/03/2019), 2 mg (10/02/2019), 2 mg (10/23/2019), 2 mg (12/04/2019) cyclophosphamide (CYTOXAN) 1,900 mg in sodium chloride 0.9 % 250 mL chemo infusion, 750 mg/m2 = 1,900 mg, Intravenous,  Once, 7 of 7 cycles Administration: 1,900 mg (07/20/2019), 1,900 mg (08/13/2019), 2,000 mg (11/13/2019), 1,900 mg (09/03/2019), 2,000 mg (10/02/2019), 2,000 mg (10/23/2019), 2,000 mg (12/04/2019) etoposide (VEPESID) 250 mg in sodium chloride 0.9 % 1,000 mL chemo infusion, 100 mg/m2 = 250 mg, Intravenous,  Once, 6 of 6 cycles Dose modification: 50 mg/m2 (original dose 100 mg/m2, Cycle 3, Reason: Provider Judgment) Administration: 130 mg (08/14/2019), 130 mg (08/15/2019), 130 mg (09/03/2019), 130 mg (09/04/2019), 130 mg (09/05/2019), 130 mg (10/03/2019), 130 mg (10/04/2019), 130 mg (10/24/2019), 130 mg (10/25/2019), 130 mg (11/13/2019), 130 mg (11/14/2019), 130 mg (11/15/2019), 130 mg (10/02/2019), 130 mg (10/23/2019), 130 mg (12/04/2019), 130 mg (12/05/2019), 130 mg (12/06/2019) dexamethasone (DECADRON) 12 mg in sodium chloride 0.9 % 145 mL IVPB, , Intravenous,  Once, 8 of 8 cycles Administration:  (07/13/2019),  (07/20/2019),  (08/13/2019),  (11/13/2019),  (09/03/2019),  (10/02/2019),  (10/23/2019),  (12/04/2019) riTUXimab-pvvr (RUXIENCE) 900 mg in sodium chloride 0.9 % 250 mL (2.6471 mg/mL) infusion, 375 mg/m2 = 900 mg (100 % of original dose 375 mg/m2), Intravenous,  Once, 1 of 1 cycle Dose modification: 375 mg/m2 (original dose 375 mg/m2, Cycle 1) Administration: 900 mg (07/13/2019)  for chemotherapy treatment.      HISTORY OF PRESENTING ILLNESS:  Eric [name] 69 y.o.  male diffuse large B-cell lymphoma is currently status post R-CEOP #6 approximately 3 months ago is here for  follow-up.  Patient interim was evaluated by cardiology.  Recommended Xarelto 10 mg/instead of the 20  mg.  Patient denies any new lumps or bumps.  Appetite is good.  No weight loss.  No nausea no vomiting.  No night sweats.  Review of Systems  Constitutional: Negative for chills, diaphoresis and fever.  HENT: Positive for hearing loss. Negative for nosebleeds and sore throat.   Eyes: Negative for double vision.  Respiratory: Negative for hemoptysis, sputum production and wheezing.   Cardiovascular: Negative for chest pain, palpitations, orthopnea and leg swelling.  Gastrointestinal: Negative for abdominal pain, blood in stool, constipation, diarrhea, heartburn, melena, nausea and vomiting.  Genitourinary: Negative for dysuria, frequency and urgency.  Musculoskeletal: Negative for back pain and joint pain.  Skin: Negative.  Negative for itching and rash.  Neurological: Negative for dizziness, tingling, focal weakness, weakness and headaches.  Endo/Heme/Allergies: Does not bruise/bleed easily.  Psychiatric/Behavioral: Negative for depression. The patient does not have insomnia.      MEDICAL HISTORY:  Past Medical History:  Diagnosis Date  . Colon polyps   . Pancreatitis   . Rectal bleeding 02/24/14  . Sepsis (HCC) 09/18/2019    SURGICAL HISTORY: Past Surgical History:  Procedure Laterality Date  . CHOLECYSTECTOMY    . COLONOSCOPY  02/28/14  . IR IMAGING GUIDED PORT INSERTION  07/18/2019  . POLYPECTOMY     colon polyp removed  . TONSILLECTOMY      SOCIAL HISTORY: Social History   Socioeconomic History  . Marital status: Married    Spouse name: Not on file  . Number of children: Not on file  . Years of education: Not on file  . Highest education level: Not on file  Occupational History    Comment: Retired from [employer]  Tobacco Use  . Smoking status: Current Every Day Smoker    Packs/day: 1.00    Years: 20.00    Pack years: 20.00    Types: Cigars  . Smokeless tobacco: Never Used  . Tobacco comment: smoke 1/2 ppd cigaretes since 69 yo. changed to cigars at age July  2004.   Substance and Sexual Activity  . Alcohol use: Yes    Comment: 6-7 drinks weekly  . Drug use: No  . Sexual activity: Not on file  Other Topics Concern  . Not on file  Social History Narrative   Lives at home with wife; in [place]. retd from [employer]. Cigar smoker; drinks liquor. Children - grown up.    Social Determinants of Health   Financial Resource Strain:   . Difficulty of Paying Living Expenses:   Food Insecurity:   . Worried About Running Out of Food in the Last Year:   . Ran Out of Food in the Last Year:   Transportation Needs:   . Lack of Transportation (Medical):   . Lack of Transportation (Non-Medical):   Physical Activity:   . Days of Exercise per Week:   . Minutes of Exercise per Session:   Stress:   . Feeling of Stress :   Social Connections:   . Frequency of Communication with Friends and Family:   . Frequency of Social Gatherings with Friends and Family:   . Attends Religious Services:   . Active Member of Clubs or Organizations:   . Attends Club or Organization Meetings:   . Marital Status:   Intimate Partner Violence:   . Fear of Current or Ex-Partner:   . Emotionally Abused:   . Physically Abused:   . Sexually Abused:       FAMILY HISTORY: Family History  Adopted: Yes    ALLERGIES:  is allergic to allopurinol; azithromycin; and losartan potassium.  MEDICATIONS:  Current Outpatient Medications  Medication Sig Dispense Refill  . carvedilol (COREG) 6.25 MG tablet Take 1 tablet (6.25 mg total) by mouth 2 (two) times daily. 180 tablet 3  . lidocaine-prilocaine (EMLA) cream Apply generously on the port 30-45 prior to accessing the port. 30 g 1  . Multiple Vitamins-Minerals (MULTI-VITAMIN/MINERALS PO) Take 1 capsule by mouth.    . rivaroxaban (XARELTO) 10 MG TABS tablet Take 1 tablet (10 mg total) by mouth daily. 30 tablet 1  . UNABLE TO FIND Med Name: CBD oil     No current facility-administered medications for this visit.    Facility-Administered Medications Ordered in Other Visits  Medication Dose Route Frequency Provider Last Rate Last Admin  . sodium chloride flush (NS) 0.9 % injection 10 mL  10 mL Intravenous PRN Brahmanday, Govinda R, MD   10 mL at 03/20/20 1001      .  PHYSICAL EXAMINATION: ECOG PERFORMANCE STATUS: 1 - Symptomatic but completely ambulatory  Vitals:   03/20/20 1000  BP: 126/74  Pulse: 66  Resp: 20  Temp: (!) 96.4 F (35.8 C)   Filed Weights   03/20/20 1011  Weight: 292 lb (132.5 kg)    Physical Exam  Constitutional: He is oriented to person, place, and time and well-developed, well-nourished, and in no distress.  HENT:  Head: Normocephalic and atraumatic.  Mouth/Throat: Oropharynx is clear and moist. No oropharyngeal exudate.  Eyes: Pupils are equal, round, and reactive to light.  Neck:  Approximately 2 cm lymph node felt in the left submandibular region  Cardiovascular: Normal rate and regular rhythm.  Pulmonary/Chest: Effort normal and breath sounds normal. No respiratory distress. He has no wheezes.  Abdominal: Soft. Bowel sounds are normal. He exhibits no distension and no mass. There is no abdominal tenderness. There is no rebound and no guarding.  Musculoskeletal:        General: No tenderness or edema. Normal range of motion.     Cervical back: Normal range of motion and neck supple.  Neurological: He is alert and oriented to person, place, and time.  Skin: Skin is warm.  Psychiatric: Affect normal.   LABORATORY DATA:  I have reviewed the data as listed Lab Results  Component Value Date   WBC 5.9 03/20/2020   HGB 13.5 03/20/2020   HCT 39.0 03/20/2020   MCV 88.6 03/20/2020   PLT 173 03/20/2020   Recent Labs    12/04/19 0801 01/15/20 1019 03/20/20 0957  NA 140 141 142  K 3.7 4.1 3.6  CL 108 104 105  CO2 25 27 28  GLUCOSE 101* 106* 134*  BUN 18 12 13  CREATININE 0.77 0.78 0.81  CALCIUM 8.9 9.1 8.6*  GFRNONAA >60 >60 >60  GFRAA >60 >60 >60   PROT 6.4* 6.5 5.9*  ALBUMIN 3.9 4.1 3.8  AST 24 24 23  ALT 25 25 31  ALKPHOS 70 69 70  BILITOT 0.5 0.8 0.7    RADIOGRAPHIC STUDIES: I have personally reviewed the radiological images as listed and agreed with the findings in the report. NM PET Image Restag (PS) Skull Base To Thigh  Result Date: 03/19/2020 CLINICAL DATA:  Subsequent treatment strategy for lymphoma. EXAM: NUCLEAR MEDICINE PET SKULL BASE TO THIGH TECHNIQUE: 15.2 mCi F-18 FDG was injected intravenously. Full-ring PET imaging was performed from the skull base to thigh after the radiotracer. CT data   was obtained and used for attenuation correction and anatomic localization. Fasting blood glucose: 89 mg/dl COMPARISON:  11/14/2019 FINDINGS: Mediastinal blood pool activity: SUV max 2.3 Liver activity: SUV max 4.4 NECK: Left-sided level II cervical node (image 41/3) shows new hypermetabolism with SUV max = 5.0. Left level II a node identified on 46/3 measures 12 mm short axis with SUV max = 5.9. No right-sided cervical hypermetabolic disease. Small submental lymph nodes are not enlarged by CT criteria but show low level FDG uptake. Incidental CT findings: Chronic left maxillary sinus disease. CHEST: No hypermetabolic mediastinal or hilar nodes. No suspicious pulmonary nodules on the CT scan. Incidental CT findings: Coronary artery calcification is evident. Atherosclerotic calcification is noted in the wall of the thoracic aorta. Right Port-A-Cath tip is positioned in the distal SVC. No suspicious pulmonary nodule or mass. ABDOMEN/PELVIS: No abnormal hypermetabolic activity within the liver, pancreas, adrenal glands, or spleen. No hypermetabolic lymph nodes in the abdomen or pelvis. Incidental CT findings: Insert fatty liver multiple left renal stones again noted with 3.4 cm exophytic interpolar left renal cyst. 1.8 cm exophytic posterior interpolar left renal lesion shows no hypermetabolism on PET imaging and is likely a complex cyst. There is  abdominal aortic atherosclerosis without aneurysm. Left colonic diverticulosis without diverticulitis. SKELETON: Diffusely mottled skeletal uptake again noted without definite hypermetabolic marrow disease. Incidental CT findings: none IMPRESSION: 1. Interval development of new small hypermetabolic left cervical lymph nodes concerning for recurrent disease ([place] 5). 2. Similar appearance of nonobstructing left renal stones and probable left renal cysts. 3. Colonic diverticulosis without diverticulitis. 4.  Aortic Atherosclerois (ICD10-170.0) Electronically Signed   By: Misty Stanley M.D.   On: 03/19/2020 12:53    ASSESSMENT & PLAN:   Lymphoma of lymph nodes (HCC) #Diffuse large B-cell lymphoma-ABC subtype; IV [spleen]; currently s/p 6 cycles of RCEOP-finished late February 2021; worsening- see below  # PET scan may 19th,2021-left neck new lymphadenopathy SUV 5/upper 12 mm in size concerning for recurrence.  However I would recommend biopsy to confirm recurrence/histology.  Discussed with Dr. Shari Prows agrees to a excisional lymph node biopsy.  Discussed with the patient that if confirmed-recurrent disease he would need to go through intensive chemotherapy.   # Bil acute PE- [nov 2020]-currently on xarelto 10 mg/day/cardiology; discussed with the patient that he will need to stop Xarelto 3 days prior to procedure.  He will inform us once the biopsy is scheduled.  # Borderline LOW EF of 45-50% [see below]; Sep Echo 50-55% [Dr.Gollan]- on coreg BID.  STABLE.   # DISPOSITION:  # follow up TBD- Dr.B  # I reviewed the blood work- with the patient in detail; also reviewed the imaging independently [as summarized above]; and with the patient in detail.       All questions were answered. The patient knows to call the clinic with any problems, questions or concerns.    Cammie Sickle, MD 03/20/2020 2:12 PM

## 2020-03-21 ENCOUNTER — Encounter: Payer: Self-pay | Admitting: Internal Medicine

## 2020-03-21 ENCOUNTER — Telehealth: Payer: Self-pay | Admitting: Internal Medicine

## 2020-03-21 ENCOUNTER — Telehealth: Payer: Self-pay | Admitting: Cardiovascular Disease

## 2020-03-21 DIAGNOSIS — R591 Generalized enlarged lymph nodes: Secondary | ICD-10-CM | POA: Diagnosis not present

## 2020-03-21 DIAGNOSIS — H903 Sensorineural hearing loss, bilateral: Secondary | ICD-10-CM | POA: Diagnosis not present

## 2020-03-21 NOTE — Telephone Encounter (Signed)
° °  [facility] Pre-operative Risk Assessment    HEARTCARE STAFF: - Please ensure there is not already an duplicate clearance open for this procedure. - Under Visit Info/Reason for Call, type in Other and utilize the format Clearance MM/DD/YY or Clearance TBD. Do not use dashes or single digits.  Request for surgical clearance:  1. What type of surgery is being performed? Exc. lymphnode  2. When is this surgery scheduled? 03/28/20  3. What type of clearance is required (medical clearance vs. Pharmacy clearance to hold med vs. Both)? both  4. Are there any medications that need to be held prior to surgery and how long? Stop Xarelto 5 days prior to surgery  5. Practice name and name of physician performing surgery? [facility] - Dr Pryor Ochoa  6. What is the office phone number? [phone]   7.   What is the office fax number? [phone]  8.   Anesthesia type (None, local, MAC, general) ? General   Eric [name] 03/21/2020, 3:57 PM  _________________________________________________________________   (provider comments below)

## 2020-03-21 NOTE — Telephone Encounter (Signed)
On 5/20-spoke to patient regarding my discussion with Dr. Wendi Maya excisional lymph node biopsy.  Discussed that would recommend holding Xarelto 3 days prior to the procedure.    Patient will call us once the biopsy is scheduled; regarding holding Xarelto.

## 2020-03-24 ENCOUNTER — Encounter: Payer: Self-pay | Admitting: Otolaryngology

## 2020-03-24 ENCOUNTER — Other Ambulatory Visit: Payer: Self-pay

## 2020-03-24 ENCOUNTER — Encounter: Payer: Self-pay | Admitting: *Deleted

## 2020-03-24 NOTE — Telephone Encounter (Signed)
Patient office calling back to check on the status of this. Patient is scheduled for Friday [phone] is phone number for nurse

## 2020-03-24 NOTE — Telephone Encounter (Signed)
[facility] calling to check status of request.

## 2020-03-25 NOTE — Telephone Encounter (Signed)
Patient is on Xarelto to PE. Managed by Dr. Rogue Bussing. Dr. B already spoke with surgeon and patient about holding anticoagulation. He recommended holding 2 days prior. These instructions were sent to patient. Dr. B would like you to have your last xarelto on 03/25/20.  DO NOT TAKE xarelto starting on 03/26/20  You may restart the xarelto on 03/30/20

## 2020-03-25 NOTE — Telephone Encounter (Signed)
Spoke with the nurse at ENT. I let her know we normally only hold Xarelto for 24-48 hours prior to procedures, not 5 days. However, we are waiting on pharmacy to make definitive recommendations. They will receive a fax as soon as we have pharmacy recommendations. She verbalized understanding.   KL

## 2020-03-25 NOTE — Anesthesia Preprocedure Evaluation (Addendum)
Anesthesia Evaluation  Patient identified by MRN, date of birth, ID band Patient awake    Reviewed: Allergy & Precautions, NPO status , Patient's Chart, lab work & pertinent test results, reviewed documented beta blocker date and time   History of Anesthesia Complications Negative for: history of anesthetic complications  Airway Mallampati: III  TM Distance: <3 FB Neck ROM: Limited   Comment:  Airway note [facility], 2015): Difficult mask requiring 2 providers + oral airway. DL w/ Mil 3 + ramp positioning + anterior pressure --> grade IIa view Dental   Pulmonary Current Smoker and Patient abstained from smoking., PE (09/2019 bilateral PEs, on Xarelto)   breath sounds clear to auscultation       Cardiovascular (-) angina+CHF  (-) DOE  Rhythm:Regular Rate:Normal   TTE (09/2019): LVEF 55-60%, mild LVH Grade I LV diastolic dysfunction RV mildly reduced systolic function RV mod enlarged LA mildly dilated   Neuro/Psych    GI/Hepatic neg GERD  , Diverticulosis   Endo/Other  Morbid obesity (BMI 40)  Renal/GU      Musculoskeletal   Abdominal   Peds  Hematology   Anesthesia Other Findings Lymphoma  Reproductive/Obstetrics                            Anesthesia Physical Anesthesia Plan  ASA: III  Anesthesia Plan: General   Post-op Pain Management:    Induction: Intravenous  PONV Risk Score and Plan: 2 and Treatment may vary due to age or medical condition, Ondansetron, Dexamethasone and Midazolam  Airway Management Planned: Oral ETT  Additional Equipment:   Intra-op Plan:   Post-operative Plan: Extubation in OR  Informed Consent: I have reviewed the patients History and Physical, chart, labs and discussed the procedure including the risks, benefits and alternatives for the proposed anesthesia with the patient or authorized representative who has indicated his/her understanding and  acceptance.       Plan Discussed with: CRNA and Anesthesiologist  Anesthesia Plan Comments:         Anesthesia Quick Evaluation

## 2020-03-25 NOTE — Telephone Encounter (Signed)
   Primary Cardiologist: No primary care provider on file.  Chart reviewed as part of pre-operative protocol coverage. Given past medical history and time since last visit, based on ACC/AHA guidelines, Eric [name] would be at acceptable risk for the planned procedure without further cardiovascular testing.   Patient is on Xarelto to PE. Managed by Dr. Rogue Bussing.  Dr. B already spoke with surgeon and patient about holding anticoagulation. He recommended holding 2 days prior. These instructions were sent to patient.  Dr. B would like you to have your last xarelto on 03/25/20.  DO NOT TAKE xarelto starting on 03/26/20  You may restart the xarelto on 03/30/20   I will route this recommendation to the requesting party via [facility] fax function and remove from pre-op pool.  Please call with questions.  Jossie Ng. Yonna Alwin NP-C    03/25/2020, 1:56 PM [facility] [place] Office [phone] Fax [phone]

## 2020-03-26 ENCOUNTER — Other Ambulatory Visit
Admission: RE | Admit: 2020-03-26 | Discharge: 2020-03-26 | Disposition: A | Discharge status: Home / Self Care | Payer: Medicare Other | Source: Ambulatory Visit | Attending: Otolaryngology | Admitting: Otolaryngology

## 2020-03-26 ENCOUNTER — Other Ambulatory Visit: Payer: Self-pay

## 2020-03-26 DIAGNOSIS — Z20822 Contact with and (suspected) exposure to covid-19: Secondary | ICD-10-CM | POA: Insufficient documentation

## 2020-03-26 DIAGNOSIS — Z01812 Encounter for preprocedural laboratory examination: Secondary | ICD-10-CM | POA: Diagnosis not present

## 2020-03-26 LAB — SARS CORONAVIRUS 2 (TAT 6-24 HRS): SARS Coronavirus 2: NEGATIVE

## 2020-03-27 NOTE — Discharge Instructions (Signed)
General Anesthesia, Adult, Care After This sheet gives you information about how to care for yourself after your procedure. Your health care provider may also give you more specific instructions. If you have problems or questions, contact your health care provider. What can I expect after the procedure? After the procedure, the following side effects are common:  Pain or discomfort at the IV site.  Nausea.  Vomiting.  Sore throat.  Trouble concentrating.  Feeling cold or chills.  Weak or tired.  Sleepiness and fatigue.  Soreness and body aches. These side effects can affect parts of the body that were not involved in surgery. Follow these instructions at home:  For at least 24 hours after the procedure:  Have a responsible adult stay with you. It is important to have someone help care for you until you are awake and alert.  Rest as needed.  Do not: ? Participate in activities in which you could fall or become injured. ? Drive. ? Use heavy machinery. ? Drink alcohol. ? Take sleeping pills or medicines that cause drowsiness. ? Make important decisions or sign legal documents. ? Take care of children on your own. Eating and drinking  Follow any instructions from your health care provider about eating or drinking restrictions.  When you feel hungry, start by eating small amounts of foods that are soft and easy to digest (bland), such as toast. Gradually return to your regular diet.  Drink enough fluid to keep your urine pale yellow.  If you vomit, rehydrate by drinking water, juice, or clear broth. General instructions  If you have sleep apnea, surgery and certain medicines can increase your risk for breathing problems. Follow instructions from your health care provider about wearing your sleep device: ? Anytime you are sleeping, including during daytime naps. ? While taking prescription pain medicines, sleeping medicines, or medicines that make you drowsy.  Return to  your normal activities as told by your health care provider. Ask your health care provider what activities are safe for you.  Take over-the-counter and prescription medicines only as told by your health care provider.  If you smoke, do not smoke without supervision.  Keep all follow-up visits as told by your health care provider. This is important. Contact a health care provider if:  You have nausea or vomiting that does not get better with medicine.  You cannot eat or drink without vomiting.  You have pain that does not get better with medicine.  You are unable to pass urine.  You develop a skin rash.  You have a fever.  You have redness around your IV site that gets worse. Get help right away if:  You have difficulty breathing.  You have chest pain.  You have blood in your urine or stool, or you vomit blood. Summary  After the procedure, it is common to have a sore throat or nausea. It is also common to feel tired.  Have a responsible adult stay with you for the first 24 hours after general anesthesia. It is important to have someone help care for you until you are awake and alert.  When you feel hungry, start by eating small amounts of foods that are soft and easy to digest (bland), such as toast. Gradually return to your regular diet.  Drink enough fluid to keep your urine pale yellow.  Return to your normal activities as told by your health care provider. Ask your health care provider what activities are safe for you. This information is not   intended to replace advice given to you by your health care provider. Make sure you discuss any questions you have with your health care provider. Document Revised: 10/21/2017 Document Reviewed: 06/03/2017 Elsevier Patient Education  2020 [employer]  

## 2020-03-28 ENCOUNTER — Ambulatory Visit
Admission: RE | Admit: 2020-03-28 | Discharge: 2020-03-28 | Disposition: A | Discharge status: Home / Self Care | Payer: Medicare Other | Attending: Otolaryngology | Admitting: Otolaryngology

## 2020-03-28 ENCOUNTER — Ambulatory Visit: Payer: Medicare Other | Admitting: Anesthesiology

## 2020-03-28 ENCOUNTER — Encounter: Payer: Self-pay | Admitting: Otolaryngology

## 2020-03-28 ENCOUNTER — Other Ambulatory Visit: Payer: Self-pay

## 2020-03-28 ENCOUNTER — Encounter
Admission: RE | Disposition: A | Discharge status: Home / Self Care | Payer: Self-pay | Source: Home / Self Care | Attending: Otolaryngology

## 2020-03-28 DIAGNOSIS — Z86711 Personal history of pulmonary embolism: Secondary | ICD-10-CM | POA: Diagnosis not present

## 2020-03-28 DIAGNOSIS — C8331 Diffuse large B-cell lymphoma, lymph nodes of head, face, and neck: Secondary | ICD-10-CM | POA: Diagnosis not present

## 2020-03-28 DIAGNOSIS — Z7901 Long term (current) use of anticoagulants: Secondary | ICD-10-CM | POA: Diagnosis not present

## 2020-03-28 DIAGNOSIS — Z881 Allergy status to other antibiotic agents status: Secondary | ICD-10-CM | POA: Diagnosis not present

## 2020-03-28 DIAGNOSIS — R221 Localized swelling, mass and lump, neck: Secondary | ICD-10-CM | POA: Diagnosis not present

## 2020-03-28 DIAGNOSIS — F17290 Nicotine dependence, other tobacco product, uncomplicated: Secondary | ICD-10-CM | POA: Diagnosis not present

## 2020-03-28 DIAGNOSIS — Z6838 Body mass index (BMI) 38.0-38.9, adult: Secondary | ICD-10-CM | POA: Diagnosis not present

## 2020-03-28 DIAGNOSIS — C8590 Non-Hodgkin lymphoma, unspecified, unspecified site: Secondary | ICD-10-CM | POA: Diagnosis not present

## 2020-03-28 DIAGNOSIS — C8330 Diffuse large B-cell lymphoma, unspecified site: Secondary | ICD-10-CM | POA: Diagnosis not present

## 2020-03-28 DIAGNOSIS — Z79899 Other long term (current) drug therapy: Secondary | ICD-10-CM | POA: Diagnosis not present

## 2020-03-28 HISTORY — DX: Other specified health status: Z78.9

## 2020-03-28 HISTORY — DX: Encounter for adoption services: Z02.82

## 2020-03-28 HISTORY — PX: LYMPH NODE BIOPSY: SHX201

## 2020-03-28 SURGERY — LYMPH NODE BIOPSY
Anesthesia: General | Site: Neck | Laterality: Left

## 2020-03-28 MED ORDER — PROPOFOL 10 MG/ML IV BOLUS
INTRAVENOUS | Status: DC | PRN
  Administered 2020-03-28: 170 mg via INTRAVENOUS

## 2020-03-28 MED ORDER — DEXAMETHASONE SODIUM PHOSPHATE 4 MG/ML IJ SOLN
INTRAMUSCULAR | Status: DC | PRN
  Administered 2020-03-28: 10 mg via INTRAVENOUS

## 2020-03-28 MED ORDER — MIDAZOLAM HCL 5 MG/5ML IJ SOLN
INTRAMUSCULAR | Status: DC | PRN
  Administered 2020-03-28: 2 mg via INTRAVENOUS

## 2020-03-28 MED ORDER — OXYCODONE HCL 5 MG/5ML PO SOLN: 5.0000 mg | Freq: Once | ORAL | Status: DC | PRN

## 2020-03-28 MED ORDER — LIDOCAINE HCL (CARDIAC) PF 100 MG/5ML IV SOSY
PREFILLED_SYRINGE | INTRAVENOUS | Status: DC | PRN
  Administered 2020-03-28: 40 mg via INTRATRACHEAL

## 2020-03-28 MED ORDER — HYDROCODONE-ACETAMINOPHEN 5-325 MG PO TABS: 1.0000 | ORAL_TABLET | ORAL | 0 refills | Status: DC | PRN

## 2020-03-28 MED ORDER — EPHEDRINE SULFATE 50 MG/ML IJ SOLN
INTRAMUSCULAR | Status: DC | PRN
  Administered 2020-03-28: 5 mg via INTRAVENOUS
  Administered 2020-03-28: 10 mg via INTRAVENOUS
  Administered 2020-03-28: 5 mg via INTRAVENOUS

## 2020-03-28 MED ORDER — FENTANYL CITRATE (PF) 100 MCG/2ML IJ SOLN
INTRAMUSCULAR | Status: DC | PRN
  Administered 2020-03-28: 50 ug via INTRAVENOUS

## 2020-03-28 MED ORDER — HYDROMORPHONE HCL 1 MG/ML IJ SOLN: 0.2500 mg | INTRAMUSCULAR | Status: DC | PRN

## 2020-03-28 MED ORDER — LIDOCAINE-EPINEPHRINE 1 %-1:100000 IJ SOLN
INTRAMUSCULAR | Status: DC | PRN
  Administered 2020-03-28: 5 mL

## 2020-03-28 MED ORDER — GLYCOPYRROLATE 0.2 MG/ML IJ SOLN
INTRAMUSCULAR | Status: DC | PRN
  Administered 2020-03-28: .1 mg via INTRAVENOUS

## 2020-03-28 MED ORDER — LACTATED RINGERS IV SOLN: INTRAVENOUS | Status: DC

## 2020-03-28 MED ORDER — PROMETHAZINE HCL 25 MG/ML IJ SOLN
6.2500 mg | INTRAMUSCULAR | Status: DC | PRN
Start: 2020-03-28 — End: 2020-03-28

## 2020-03-28 MED ORDER — ONDANSETRON HCL 4 MG/2ML IJ SOLN
INTRAMUSCULAR | Status: DC | PRN
  Administered 2020-03-28: 4 mg via INTRAVENOUS

## 2020-03-28 MED ORDER — ONDANSETRON HCL 4 MG PO TABS: 4.0000 mg | ORAL_TABLET | Freq: Three times a day (TID) | ORAL | 0 refills | Status: DC | PRN

## 2020-03-28 MED ORDER — OXYCODONE HCL 5 MG PO TABS: 5.0000 mg | ORAL_TABLET | Freq: Once | ORAL | Status: DC | PRN

## 2020-03-28 SURGICAL SUPPLY — 23 items
APPLICATOR COTTON TIP 3IN (MISCELLANEOUS) ×2 IMPLANT
CORD BIP STRL DISP 12FT (MISCELLANEOUS) ×2 IMPLANT
DERMABOND ADVANCED (GAUZE/BANDAGES/DRESSINGS) ×1
DERMABOND ADVANCED .7 DNX12 (GAUZE/BANDAGES/DRESSINGS) ×1 IMPLANT
ELECT EMG 20MM DUAL (MISCELLANEOUS) ×2
ELECT NEEDLE 20X.3 GREEN (MISCELLANEOUS) ×2
ELECTRODE EMG 20MM DUAL (MISCELLANEOUS) IMPLANT
ELECTRODE NDL 20X.3 GREEN (MISCELLANEOUS) IMPLANT
ELECTRODE NEEDLE 20X.3 GREEN (MISCELLANEOUS) ×1 IMPLANT
GLOVE BIO SURGEON STRL SZ7.5 (GLOVE) ×5 IMPLANT
GOWN STRL REUS W/ TWL LRG LVL3 (GOWN DISPOSABLE) ×3 IMPLANT
GOWN STRL REUS W/TWL LRG LVL3 (GOWN DISPOSABLE) ×2
HEMOSTAT SURGICEL 2X3 (HEMOSTASIS) ×2 IMPLANT
KIT TURNOVER KIT A (KITS) ×2 IMPLANT
NS IRRIG 500ML POUR BTL (IV SOLUTION) ×2 IMPLANT
PACK ENT CUSTOM (PACKS) ×2 IMPLANT
PROBE MONO 100X0.75 ELECT 1.9M (MISCELLANEOUS) ×1 IMPLANT
SHEARS HARMONIC 9CM CVD (BLADE) ×2 IMPLANT
SPONGE KITTNER 5P (MISCELLANEOUS) ×3 IMPLANT
STRIP CLOSURE SKIN 1/4X4 (GAUZE/BANDAGES/DRESSINGS) ×1 IMPLANT
SUT CHROMIC 4 0 PS 2 18 (SUTURE) ×1 IMPLANT
SUT VIC AB 4-0 RB1 18 (SUTURE) ×1 IMPLANT
TOWEL OR 17X26 4PK STRL BLUE (TOWEL DISPOSABLE) ×2 IMPLANT

## 2020-03-28 NOTE — Op Note (Signed)
..  03/28/2020  8:15 AM    Greer Pickerel  OG:[id number]   Pre-Op Dx: neck mass  Post-op Dx: SAME  Proc: Excision of left deep cervical lymph node  Surg: Carloyn Manner   Assistant:  McQueen  Anes: LMA  EBL: <5  Comp: None  Indications: History of lymphoma with new hypermetabolic lymph node in left neck on PET  Findings: Enlarged friable lymph node removed from left level submandibular region  Description of Procedure: After the patient was identified in hold and the history and physical and consent was reviewed and updated. The patient was marked in the normal fashion along a left neck skin crease 2 fingers below level of mandible. The patient was next taken to the operating room and placed in a supine position. General laryngeal mask anesthesia was induced in the normal fashion.  Facial nerve monitoring electrodes and stimulator were placed in the normal fashion monitoring the patient's left marginal mandibular nerve. The patient's left neck was neck injected with 5cc's of 1% lidocaine with 1:100,000 Epinephrine. The patient was next prepped and draped in a sterile normal fashion.  At this time, a 15 blade scalpel was used to make a skin incision along a previously marked neck crease. Dissection was carefully performed through the subcutaneous tissues with combination of bipolar electrocautery and Harmonic scalpel and blunt dissection.The platysma was divided. Blunt dissection was brought superiorly until the inferior aspect of the lymph node was encountered.  This was bluntly dissected circumferentially and separated from the inferior aspect of the submandibular gland.  Hemostasis was achieved with harmonic scalpel and bioplar.  A friable lymph node was encountered and carefully removed in its entirety.  The wound was irrigated with sterile saline. Meticulous hemostasis with bipolar was obtained. Surgicel was placed in the wound bed. The wound was then closed in a  multilayered fashion with vicryl for subcutaneous tissues and Dermabond type skin adhesive for the cutaneous skin and topped with a steri-strip.  At this time the patient was extubated and taken to PACU in good condition.  Plan: Follow pathology with lymphoma workup. Follow up next week for post-operative evaluation.  Francetta Ilg  03/28/2020 8:15 AM

## 2020-03-28 NOTE — H&P (Signed)
..  History and Physical paper copy reviewed and updated date of procedure and will be scanned into system.  Patient seen and examined and left neck marked.

## 2020-03-28 NOTE — Anesthesia Postprocedure Evaluation (Signed)
Anesthesia Post Note  Patient: Eric [name]  Procedure(s) Performed: EXCISION OF SUBMANDIBULAR  LYMPH NODE (Left Neck)     Patient location during evaluation: PACU Anesthesia Type: General Level of consciousness: awake and alert Pain management: pain level controlled Vital Signs Assessment: post-procedure vital signs reviewed and stable Respiratory status: spontaneous breathing, nonlabored ventilation, respiratory function stable and patient connected to nasal cannula oxygen Cardiovascular status: blood pressure returned to baseline and stable Postop Assessment: no apparent nausea or vomiting Anesthetic complications: no    Mia Milan A  Raneisha Bress

## 2020-03-28 NOTE — Transfer of Care (Signed)
Immediate Anesthesia Transfer of Care Note  Patient: Eric [name]  Procedure(s) Performed: LYMPH NODE EXCISION (Left )  Patient Location: PACU  Anesthesia Type: General  Level of Consciousness: awake, alert  and patient cooperative  Airway and Oxygen Therapy: Patient Spontanous Breathing and Patient connected to supplemental oxygen  Post-op Assessment: Post-op Vital signs reviewed, Patient's Cardiovascular Status Stable, Respiratory Function Stable, Patent Airway and No signs of Nausea or vomiting  Post-op Vital Signs: Reviewed and stable  Complications: No apparent anesthesia complications

## 2020-03-28 NOTE — Anesthesia Procedure Notes (Signed)
Procedure Name: LMA Insertion Date/Time: 03/28/2020 7:37 AM Performed by: Mayme Genta, CRNA Pre-anesthesia Checklist: Patient identified, Emergency Drugs available, Suction available, Timeout performed and Patient being monitored Patient Re-evaluated:Patient Re-evaluated prior to induction Oxygen Delivery Method: Circle system utilized Preoxygenation: Pre-oxygenation with 100% oxygen Induction Type: IV induction LMA: LMA inserted LMA Size: 5.0 Number of attempts: 1 Placement Confirmation: positive ETCO2 and breath sounds checked- equal and bilateral Tube secured with: Tape

## 2020-04-01 ENCOUNTER — Telehealth: Payer: Self-pay | Admitting: Internal Medicine

## 2020-04-01 ENCOUNTER — Encounter: Payer: Self-pay | Admitting: Internal Medicine

## 2020-04-01 LAB — SURGICAL PATHOLOGY

## 2020-04-01 NOTE — Telephone Encounter (Signed)
On 6/1- spoke to pt re: results of the biopsy positive for diffuse B-cell lymphoma.  Recommend follow-up-to discuss treatment options.  C-schedule on June 3; 8:45-MD no labs.  Please confirm with patient. Thx.

## 2020-04-03 ENCOUNTER — Inpatient Hospital Stay: Payer: Medicare Other | Attending: Internal Medicine | Admitting: Internal Medicine

## 2020-04-03 ENCOUNTER — Encounter: Payer: Self-pay | Admitting: Internal Medicine

## 2020-04-03 ENCOUNTER — Other Ambulatory Visit: Payer: Self-pay

## 2020-04-03 DIAGNOSIS — C8330 Diffuse large B-cell lymphoma, unspecified site: Secondary | ICD-10-CM | POA: Insufficient documentation

## 2020-04-03 DIAGNOSIS — C8590 Non-Hodgkin lymphoma, unspecified, unspecified site: Secondary | ICD-10-CM | POA: Diagnosis not present

## 2020-04-03 DIAGNOSIS — Z86711 Personal history of pulmonary embolism: Secondary | ICD-10-CM | POA: Insufficient documentation

## 2020-04-03 DIAGNOSIS — Z7901 Long term (current) use of anticoagulants: Secondary | ICD-10-CM | POA: Insufficient documentation

## 2020-04-03 NOTE — Progress Notes (Signed)
[facility] NOTE  Patient Care Team: [name], MD as PCP - General (Family Medicine) [name] [name], MD (General Surgery)  CHIEF COMPLAINTS/PURPOSE OF CONSULTATION: Diffuse large B-cell lymphoma  #  Oncology History Overview Note  #June 29, 2019-CT- bulky bilateral neck adenopathy left more than right; September 2020-Diffuse B-cell lymphoma; ABC subtype; NEGATIVE FISH studies.  PET scan -bulky adenopathy involving neck /lymphadenopathy mediastinum /abdomen/pelvis inguinal lymph nodes; splenic involvement.  Question bone.  Stage IV [spleen involvement; no bone marrow biopsy].  BCL-2: Positive /BCL 6: Positive /MUM-1: Positive /CD10: Negative 'Ki-67: High proliferation index (90%) /CD30: Negative /Cyclin D1: Negative C-MYC: Equivocal (20% staining seen on limited and fragmented tissue) /EBER: Negative   # Ritux-Pred- 9/11; 9/18- R-COP [held adria- sec to EF 45-50%]  #May 2021-PET scan left neck recurrent disease; excisional biopsy [Dr. Vaught]-recurrent diffuse left B-cell lymphoma  #November 2020-bilateral PE; on xarelto  # Sep 2020- Borderline low EF/Left ventricular hypokinesis [Dr.Gollan]-s October 9th stress test-nonischemic;   # DIAGNOSIS: DLBCL  STAGE:   4      ;GOALS: Cure  CURRENT/MOST RECENT THERAPY : R-COP/-R-CEOP. [C]    Lymphoma of lymph nodes ([place])  07/03/2019 Initial Diagnosis   Lymphoma of lymph nodes ([place])   07/13/2019 -  Chemotherapy   The patient had palonosetron (ALOXI) injection 0.25 mg, 0.25 mg, Intravenous,  Once, 7 of 7 cycles Administration: 0.25 mg (07/20/2019), 0.25 mg (08/13/2019), 0.25 mg (11/13/2019), 0.25 mg (09/03/2019), 0.25 mg (10/02/2019), 0.25 mg (10/23/2019), 0.25 mg (12/04/2019) pegfilgrastim (NEULASTA ONPRO KIT) injection 6 mg, 6 mg, Subcutaneous, Once, 2 of 2 cycles Administration: 6 mg (10/04/2019), 6 mg (10/25/2019) pegfilgrastim-cbqv (UDENYCA) injection 6 mg, 6 mg, Subcutaneous, Once, 5 of 5  cycles Administration: 6 mg (07/23/2019), 6 mg (08/16/2019), 6 mg (09/06/2019), 6 mg (11/16/2019), 6 mg (12/07/2019) vinCRIStine (ONCOVIN) 2 mg in sodium chloride 0.9 % 50 mL chemo infusion, 2 mg, Intravenous,  Once, 7 of 7 cycles Administration: 2 mg (07/20/2019), 2 mg (08/13/2019), 2 mg (11/13/2019), 2 mg (09/03/2019), 2 mg (10/02/2019), 2 mg (10/23/2019), 2 mg (12/04/2019) cyclophosphamide (CYTOXAN) 1,900 mg in sodium chloride 0.9 % 250 mL chemo infusion, 750 mg/m2 = 1,900 mg, Intravenous,  Once, 7 of 7 cycles Administration: 1,900 mg (07/20/2019), 1,900 mg (08/13/2019), 2,000 mg (11/13/2019), 1,900 mg (09/03/2019), 2,000 mg (10/02/2019), 2,000 mg (10/23/2019), 2,000 mg (12/04/2019) etoposide (VEPESID) 250 mg in sodium chloride 0.9 % 1,000 mL chemo infusion, 100 mg/m2 = 250 mg, Intravenous,  Once, 6 of 6 cycles Dose modification: 50 mg/m2 (original dose 100 mg/m2, Cycle 3, Reason: Provider Judgment) Administration: 130 mg (08/14/2019), 130 mg (08/15/2019), 130 mg (09/03/2019), 130 mg (09/04/2019), 130 mg (09/05/2019), 130 mg (10/03/2019), 130 mg (10/04/2019), 130 mg (10/24/2019), 130 mg (10/25/2019), 130 mg (11/13/2019), 130 mg (11/14/2019), 130 mg (11/15/2019), 130 mg (10/02/2019), 130 mg (10/23/2019), 130 mg (12/04/2019), 130 mg (12/05/2019), 130 mg (12/06/2019) dexamethasone (DECADRON) 12 mg in sodium chloride 0.9 % 145 mL IVPB, , Intravenous,  Once, 8 of 8 cycles Administration:  (07/13/2019),  (07/20/2019),  (08/13/2019),  (11/13/2019),  (09/03/2019),  (10/02/2019),  (10/23/2019),  (12/04/2019) riTUXimab-pvvr (RUXIENCE) 900 mg in sodium chloride 0.9 % 250 mL (2.6471 mg/mL) infusion, 375 mg/m2 = 900 mg (100 % of original dose 375 mg/m2), Intravenous,  Once, 1 of 1 cycle Dose modification: 375 mg/m2 (original dose 375 mg/m2, Cycle 1) Administration: 900 mg (07/13/2019)  for chemotherapy treatment.      HISTORY OF PRESENTING ILLNESS:  Eric [name] 69 y.o.  male diffuse  large B-cell lymphoma is currently status post R-CEOP #6  -finished approximately February 2021 is here for follow-up.  The interim patient a PET scan that showed possible recurrent disease in the left neck; he also underwent excision for biopsy with ENT.  Patient is here to review the results.  Denies any new lumps or bumps.  Currently on Xarelto 10 mg a day.  No nausea no vomiting.  No night sweats.  No weight loss.   Review of Systems  Constitutional: Negative for chills, diaphoresis and fever.  HENT: Positive for hearing loss. Negative for nosebleeds and sore throat.   Eyes: Negative for double vision.  Respiratory: Negative for hemoptysis, sputum production and wheezing.   Cardiovascular: Negative for chest pain, palpitations, orthopnea and leg swelling.  Gastrointestinal: Negative for abdominal pain, blood in stool, constipation, diarrhea, heartburn, melena, nausea and vomiting.  Genitourinary: Negative for dysuria, frequency and urgency.  Musculoskeletal: Negative for back pain and joint pain.  Skin: Negative.  Negative for itching and rash.  Neurological: Negative for dizziness, tingling, focal weakness, weakness and headaches.  Endo/Heme/Allergies: Does not bruise/bleed easily.  Psychiatric/Behavioral: Negative for depression. The patient does not have insomnia.      MEDICAL HISTORY:  Past Medical History:  Diagnosis Date  . Adopted person   . Colon polyps   . Pancreatitis   . Rectal bleeding 02/24/14  . Sepsis ([place]) 09/18/2019    SURGICAL HISTORY: Past Surgical History:  Procedure Laterality Date  . CHOLECYSTECTOMY    . COLONOSCOPY  02/28/14  . IR IMAGING GUIDED PORT INSERTION  07/18/2019  . LYMPH NODE BIOPSY Left 03/28/2020   Procedure: EXCISION OF SUBMANDIBULAR  LYMPH NODE;  Surgeon: Carloyn Manner, MD;  Location: [facility];  Service: ENT;  Laterality: Left;  . POLYPECTOMY     colon polyp removed  . TONSILLECTOMY      SOCIAL HISTORY: Social History   Socioeconomic History  . Marital status: Married     Spouse name: Not on file  . Number of children: Not on file  . Years of education: Not on file  . Highest education level: Not on file  Occupational History    Comment: Retired from [employer]  . Smoking status: Current Every Day Smoker    Packs/day: 1.00    Years: 20.00    Pack years: 20.00    Types: Cigars  . Smokeless tobacco: Never Used  . Tobacco comment: smoke 1/2 ppd cigaretes since 69 yo. changed to cigars at age July 2004.   Substance and Sexual Activity  . Alcohol use: Yes    Comment: 6-7 drinks weekly  . Drug use: No  . Sexual activity: Not on file  Other Topics Concern  . Not on file  Social History Narrative   Lives at home with wife; in [place]. retd from [employer] smoker; drinks liquor. Children - grown up.    Social Determinants of Health   Financial Resource Strain:   . Difficulty of Paying Living Expenses:   Food Insecurity:   . Worried About Charity fundraiser in the Last Year:   . Arboriculturist in the Last Year:   Transportation Needs:   . Film/video editor (Medical):   Marland Kitchen Lack of Transportation (Non-Medical):   Physical Activity:   . Days of Exercise per Week:   . Minutes of Exercise per Session:   Stress:   . Feeling of Stress :   Social Connections:   . Frequency of  Communication with Friends and Family:   . Frequency of Social Gatherings with Friends and Family:   . Attends Religious Services:   . Active Member of Clubs or Organizations:   . Attends Archivist Meetings:   Marland Kitchen Marital Status:   Intimate Partner Violence:   . Fear of Current or Ex-Partner:   . Emotionally Abused:   Marland Kitchen Physically Abused:   . Sexually Abused:     FAMILY HISTORY: Family History  Adopted: Yes    ALLERGIES:  is allergic to allopurinol; azithromycin; and losartan potassium.  MEDICATIONS:  Current Outpatient Medications  Medication Sig Dispense Refill  . carvedilol (COREG) 6.25 MG tablet Take 1 tablet (6.25 mg total) by  mouth 2 (two) times daily. 180 tablet 3  . lidocaine-prilocaine (EMLA) cream Apply generously on the port 30-45 prior to accessing the port. 30 g 1  . Multiple Vitamins-Minerals (MULTI-VITAMIN/MINERALS PO) Take 1 capsule by mouth.    . rivaroxaban (XARELTO) 10 MG TABS tablet Take 1 tablet (10 mg total) by mouth daily. 30 tablet 1  . UNABLE TO FIND Med Name: CBD oil    . HYDROcodone-acetaminophen (NORCO/VICODIN) 5-325 MG tablet Take 1 tablet by mouth every 4 (four) hours as needed for moderate pain. (Patient not taking: Reported on 04/03/2020) 20 tablet 0  . ondansetron (ZOFRAN) 4 MG tablet Take 1 tablet (4 mg total) by mouth every 8 (eight) hours as needed for up to 10 doses for nausea or vomiting. (Patient not taking: Reported on 04/03/2020) 20 tablet 0   No current facility-administered medications for this visit.      Marland Kitchen  PHYSICAL EXAMINATION: ECOG PERFORMANCE STATUS: 1 - Symptomatic but completely ambulatory  Vitals:   04/03/20 0856  BP: (!) 148/87  Pulse: 70  Temp: (!) 96.6 F (35.9 C)  SpO2: 100%   Filed Weights   04/03/20 0856  Weight: 290 lb 6.4 oz (131.7 kg)    Physical Exam  Constitutional: He is oriented to person, place, and time and well-developed, well-nourished, and in no distress.  HENT:  Head: Normocephalic and atraumatic.  Mouth/Throat: Oropharynx is clear and moist. No oropharyngeal exudate.  Eyes: Pupils are equal, round, and reactive to light.  Neck:  Approximately 2 cm lymph node felt in the left submandibular region  Cardiovascular: Normal rate and regular rhythm.  Pulmonary/Chest: Effort normal and breath sounds normal. No respiratory distress. He has no wheezes.  Abdominal: Soft. Bowel sounds are normal. He exhibits no distension and no mass. There is no abdominal tenderness. There is no rebound and no guarding.  Musculoskeletal:        General: No tenderness or edema. Normal range of motion.     Cervical back: Normal range of motion and neck supple.   Neurological: He is alert and oriented to person, place, and time.  Skin: Skin is warm.  Psychiatric: Affect normal.   LABORATORY DATA:  I have reviewed the data as listed Lab Results  Component Value Date   WBC 5.9 03/20/2020   HGB 13.5 03/20/2020   HCT 39.0 03/20/2020   MCV 88.6 03/20/2020   PLT 173 03/20/2020   Recent Labs    12/04/19 0801 01/15/20 1019 03/20/20 0957  NA 140 141 142  K 3.7 4.1 3.6  CL 108 104 105  CO2 '25 27 28  '$ GLUCOSE 101* 106* 134*  BUN '18 12 13  '$ CREATININE 0.77 0.78 0.81  CALCIUM 8.9 9.1 8.6*  GFRNONAA >60 >60 >60  GFRAA >60 >60 >60  PROT  6.4* 6.5 5.9*  ALBUMIN 3.9 4.1 3.8  AST '24 24 23  '$ ALT '25 25 31  '$ ALKPHOS 70 69 70  BILITOT 0.5 0.8 0.7    RADIOGRAPHIC STUDIES: I have personally reviewed the radiological images as listed and agreed with the findings in the report. NM PET Image Restag (PS) Skull Base To Thigh  Result Date: 03/19/2020 CLINICAL DATA:  Subsequent treatment strategy for lymphoma. EXAM: NUCLEAR MEDICINE PET SKULL BASE TO THIGH TECHNIQUE: 15.2 mCi F-18 FDG was injected intravenously. Full-ring PET imaging was performed from the skull base to thigh after the radiotracer. CT data was obtained and used for attenuation correction and anatomic localization. Fasting blood glucose: 89 mg/dl COMPARISON:  11/14/2019 FINDINGS: Mediastinal blood pool activity: SUV max 2.3 Liver activity: SUV max 4.4 NECK: Left-sided level II cervical node (image 41/3) shows new hypermetabolism with SUV max = 5.0. Left level II a node identified on 46/3 measures 12 mm short axis with SUV max = 5.9. No right-sided cervical hypermetabolic disease. Small submental lymph nodes are not enlarged by CT criteria but show low level FDG uptake. Incidental CT findings: Chronic left maxillary sinus disease. CHEST: No hypermetabolic mediastinal or hilar nodes. No suspicious pulmonary nodules on the CT scan. Incidental CT findings: Coronary artery calcification is evident.  Atherosclerotic calcification is noted in the wall of the thoracic aorta. Right Port-A-Cath tip is positioned in the distal SVC. No suspicious pulmonary nodule or mass. ABDOMEN/PELVIS: No abnormal hypermetabolic activity within the liver, pancreas, adrenal glands, or spleen. No hypermetabolic lymph nodes in the abdomen or pelvis. Incidental CT findings: Insert fatty liver multiple left renal stones again noted with 3.4 cm exophytic interpolar left renal cyst. 1.8 cm exophytic posterior interpolar left renal lesion shows no hypermetabolism on PET imaging and is likely a complex cyst. There is abdominal aortic atherosclerosis without aneurysm. Left colonic diverticulosis without diverticulitis. SKELETON: Diffusely mottled skeletal uptake again noted without definite hypermetabolic marrow disease. Incidental CT findings: none IMPRESSION: 1. Interval development of new small hypermetabolic left cervical lymph nodes concerning for recurrent disease ([place] 5). 2. Similar appearance of nonobstructing left renal stones and probable left renal cysts. 3. Colonic diverticulosis without diverticulitis. 4.  Aortic Atherosclerois (ICD10-170.0) Electronically Signed   By: Misty Stanley M.D.   On: 03/19/2020 12:53    ASSESSMENT & PLAN:   Lymphoma of lymph nodes ([facility]) #Diffuse large B-cell lymphoma-ABC subtype; IV [spleen]; currently s/p 6 cycles of RCEOP-finished late February 2021- PET scan may 19th,2021-left neck new lymphadenopathy SUV 5/upper 12 mm in size concerning for recurrence; s/p excisional biopsy-positive for diffuse large B-cell lymphoma.  #Discussed with the patient regarding results of biopsy; the fact the patient will need salvage therapy.  Since patient is in general transplant eligible-we will recommend evaluation at higher center for consideration of autologous stem cell transplant after salvage therapy.  # Bil acute PE- [nov 2020]-currently on xarelto 10 mg/day/cardiology-continue the same for  now.  # Borderline LOW EF of 45-50%; Sep Echo 50-55% [Dr.Gollan]- on coreg BID-stable  #Patient obviously is disappointed with the results; however counseled the patient that good treatment options available down the line.  Patient interested with referral to a tertiary center; will make referral to Dr. Leilani Able.;  I also reach out to Dr. Thera Flake to discuss his care.  # DISPOSITION:  # Referral Dr.Grover [facility]- Dx: relapsed Diffuse large B cell lymphoma/urgent # Follow up TBD-Dr.B  # I reviewed the blood work- with the patient in detail; also reviewed the imaging independently [  as summarized above]; and with the patient in detail.       All questions were answered. The patient knows to call the clinic with any problems, questions or concerns.    Cammie Sickle, MD 04/03/2020 12:11 PM

## 2020-04-03 NOTE — Progress Notes (Signed)
Referral to Dr. Thera Flake faxed to attention: Raelyn Ensign (referral coordinator) [phone]

## 2020-04-03 NOTE — Progress Notes (Signed)
Pt in to discuss lab results and treatment options.

## 2020-04-03 NOTE — Assessment & Plan Note (Addendum)
#  Diffuse large B-cell lymphoma-ABC subtype; IV [spleen]; currently s/p 6 cycles of RCEOP-finished late February 2021- PET scan may 19th,2021-left neck new lymphadenopathy SUV 5/upper 12 mm in size concerning for recurrence; s/p excisional biopsy-positive for diffuse large B-cell lymphoma.  #Discussed with the patient regarding results of biopsy; the fact the patient will need salvage therapy.  Since patient is in general transplant eligible-we will recommend evaluation at higher center for consideration of autologous stem cell transplant after salvage therapy.  # Bil acute PE- [nov 2020]-currently on xarelto 10 mg/day/cardiology-continue the same for now.  # Borderline LOW EF of 45-50%; Sep Echo 50-55% [Dr.Gollan]- on coreg BID-stable  #Patient obviously is disappointed with the results; however counseled the patient that good treatment options available down the line.  Patient interested with referral to a tertiary center; will make referral to Dr. Leilani Able.;  I also reach out to Dr. Thera Flake to discuss his care.  # DISPOSITION:  # Referral Dr.[facility]- Dx: relapsed Diffuse large B cell lymphoma/urgent # Follow up TBD-Dr.B  # I reviewed the blood work- with the patient in detail; also reviewed the imaging independently [as summarized above]; and with the patient in detail.

## 2020-04-08 ENCOUNTER — Telehealth: Payer: Self-pay | Admitting: Internal Medicine

## 2020-04-08 ENCOUNTER — Encounter: Payer: Self-pay | Admitting: Internal Medicine

## 2020-04-08 ENCOUNTER — Telehealth: Payer: Self-pay | Admitting: *Deleted

## 2020-04-08 NOTE — Telephone Encounter (Signed)
On 6/08-spoke to patient regarding the upcoming appointment at [facility]; likely

## 2020-04-08 NOTE — Telephone Encounter (Signed)
I spoke with Marjorie Smolder at [facility] to follow-up on pt's [facility] referral. She will reach out to the patient. Marjorie Smolder can be reached - [phone] (phone) Fax  [phone]

## 2020-04-09 ENCOUNTER — Encounter: Payer: Self-pay | Admitting: Internal Medicine

## 2020-04-09 ENCOUNTER — Telehealth: Payer: Self-pay | Admitting: Internal Medicine

## 2020-04-09 NOTE — Telephone Encounter (Signed)
On 6/09-spoke to patient and wife regarding his upcoming appointment at [facility].  He understands that he will meet with Dr. Thera Flake; also most likely the transplant team-further consideration of transplant post induction chemotherapy.  Discussed with him that is most likely going to be a informational meeting; and patient would not get any invasive procedures or therapies.  Also discussed that I have reached out to Dr. Carlyle Basques team.   Juluis Rainier

## 2020-04-18 DIAGNOSIS — Z86711 Personal history of pulmonary embolism: Secondary | ICD-10-CM | POA: Diagnosis not present

## 2020-04-18 DIAGNOSIS — C8331 Diffuse large B-cell lymphoma, lymph nodes of head, face, and neck: Secondary | ICD-10-CM | POA: Diagnosis not present

## 2020-04-18 DIAGNOSIS — Z7901 Long term (current) use of anticoagulants: Secondary | ICD-10-CM | POA: Diagnosis not present

## 2020-04-21 ENCOUNTER — Other Ambulatory Visit: Payer: Self-pay | Admitting: Internal Medicine

## 2020-04-23 ENCOUNTER — Telehealth: Payer: Self-pay | Admitting: Family Medicine

## 2020-04-23 ENCOUNTER — Telehealth: Payer: Self-pay | Admitting: Internal Medicine

## 2020-04-23 DIAGNOSIS — C8590 Non-Hodgkin lymphoma, unspecified, unspecified site: Secondary | ICD-10-CM

## 2020-04-23 DIAGNOSIS — C8331 Diffuse large B-cell lymphoma, lymph nodes of head, face, and neck: Secondary | ICD-10-CM | POA: Diagnosis not present

## 2020-04-23 DIAGNOSIS — C8330 Diffuse large B-cell lymphoma, unspecified site: Secondary | ICD-10-CM | POA: Insufficient documentation

## 2020-04-23 NOTE — Telephone Encounter (Signed)
Pts wife called and is declining to set up AWV at this time. Please advise.

## 2020-04-23 NOTE — Telephone Encounter (Signed)
Copied from [place] [id number]. Topic: Medicare AWV >> Apr 23, 2020 12:01 PM Cher Nakai R wrote: Reason for CRM: Called patient to schedule Medicare Annual Wellness Visit with Nurse Health Advisor.   If patient returns call, please schedule for any date.  Per [place] AWVI due as of 05/02/2019    Appointment length should be 40 minutes  Thank you,  Colletta [place] [phone]

## 2020-04-23 NOTE — Telephone Encounter (Signed)
On 6/23-called patient post evaluation at [facility]. Recommend follow-up to discuss salvage chemotherapy  C-follow-up-June 25/Friday-9:30; labs CBC CMP [facility]-please confirm appointment with patient.

## 2020-04-24 ENCOUNTER — Encounter: Payer: Self-pay | Admitting: Internal Medicine

## 2020-04-24 ENCOUNTER — Other Ambulatory Visit: Payer: Self-pay

## 2020-04-24 NOTE — Addendum Note (Signed)
Addended by: Gloris Ham on: 04/24/2020 08:36 AM   Modules accepted: Orders

## 2020-04-25 ENCOUNTER — Inpatient Hospital Stay (HOSPITAL_BASED_OUTPATIENT_CLINIC_OR_DEPARTMENT_OTHER): Payer: Medicare Other | Admitting: Internal Medicine

## 2020-04-25 ENCOUNTER — Inpatient Hospital Stay: Payer: Medicare Other

## 2020-04-25 ENCOUNTER — Encounter: Payer: Self-pay | Admitting: Internal Medicine

## 2020-04-25 DIAGNOSIS — C8330 Diffuse large B-cell lymphoma, unspecified site: Secondary | ICD-10-CM | POA: Diagnosis not present

## 2020-04-25 DIAGNOSIS — Z86711 Personal history of pulmonary embolism: Secondary | ICD-10-CM | POA: Diagnosis not present

## 2020-04-25 DIAGNOSIS — C8590 Non-Hodgkin lymphoma, unspecified, unspecified site: Secondary | ICD-10-CM

## 2020-04-25 DIAGNOSIS — Z7901 Long term (current) use of anticoagulants: Secondary | ICD-10-CM | POA: Diagnosis not present

## 2020-04-25 MED ORDER — DEXAMETHASONE 4 MG PO TABS
ORAL_TABLET | ORAL | 0 refills | Status: DC
Start: 2020-04-25 — End: 2020-05-30

## 2020-04-25 NOTE — Progress Notes (Signed)
DISCONTINUE ON PATHWAY REGIMEN - Lymphoma and CLL     A cycle is every 21 days:     Prednisone      Rituximab-xxxx      Cyclophosphamide      Doxorubicin      Vincristine   **Always confirm dose/schedule in your pharmacy ordering system**  REASON: Disease Progression PRIOR TREATMENT: [id number]: R(IV)-CHOP q21 Days x 6 Cycles TREATMENT RESPONSE: Progressive Disease (PD)  START OFF PATHWAY REGIMEN - Lymphoma and CLL   [id number]:GDP (gemcitabine + dexamethasone + cisplatin) q21 Days:   A cycle is every 21 days:     Dexamethasone      Gemcitabine      Cisplatin   **Always confirm dose/schedule in your pharmacy ordering system**  Patient Characteristics: Diffuse Large B-Cell Lymphoma or Follicular Lymphoma, Grade 3B, Relapsed / Refractory, All Stages,  Second Line, Transplant Candidate Disease Type: Not Applicable Disease Type: Diffuse Large B-Cell Lymphoma Disease Type: Not Applicable Line of therapy: Relapsed / Refractory - Second Line [place] Stage: IV Patient Characteristics: Transplant Candidate Intent of Therapy: Curative Intent, Discussed with Patient

## 2020-04-25 NOTE — Assessment & Plan Note (Addendum)
#  Relapsed diffuse large B-cell lymphoma-ABC subtype; IV [spleen]; currently s/p 6 cycles of RCEOP-finished late February 2021- PET scan may 19th,2021-left neck new lymphadenopathy SUV 5/upper 12 mm; s/p excisional biopsy-positive for diffuse large B-cell lymphoma.  #Recommend salvage therapy-gemcitabine-carboplatin-day 1; gemcitabine day 8; every 21 days.  Dexamethasone 40 mg daily day 1 through day 4.  Recommend 2 cycles; with follow-up restaging PET scan.  Understands that goal of treatment is cure.   Discussed the potential side effects including but not limited to-increasing fatigue, nausea vomiting, diarrhea, hair loss, sores in the mouth, increase risk of infection and also neuropathy.    #If PET scan shows complete response patient proceed with autologous stem cell transplant.  If he does have suboptimal response would recommend-CAR-T therapy.  Patient has been evaluated at [place] service Dr. Thera Flake; also stem cell transplant service.  # Bil acute PE- [nov 2020]-currently on xarelto 10 mg/day/cardiology-continue the same for now.  # Borderline LOW EF of 45-50%; Sep Echo 50-55% [Dr.Gollan]- on coreg BID-stable   # DISPOSITION:  # Follow up in week of July 5th, MD; labs-cbc/cmp/ldh; Algis Greenhouse # day- 8; labs- cbc/cmp;Gem-Dr.B  # I reviewed the blood work- with the patient in detail; also reviewed the imaging independently [as summarized above]; and with the patient in detail.  Reviewed the records from Dr. Marin Roberts in detail.

## 2020-04-25 NOTE — Progress Notes (Signed)
[facility] NOTE  Patient Care Team: [name], MD as PCP - General (Family Medicine) [name], [name], MD (General Surgery) Cammie Sickle, MD as Consulting Physician (Internal Medicine)  CHIEF COMPLAINTS/PURPOSE OF CONSULTATION: Diffuse large B-cell lymphoma  #  Oncology History Overview Note  #June 29, 2019-CT- bulky bilateral neck adenopathy left more than right; September 2020-Diffuse B-cell lymphoma; ABC subtype; NEGATIVE FISH studies.  PET scan -bulky adenopathy involving neck /lymphadenopathy mediastinum /abdomen/pelvis inguinal lymph nodes; splenic involvement.  Question bone.  Stage IV [spleen involvement; no bone marrow biopsy].  BCL-2: Positive /BCL 6: Positive /MUM-1: Positive /CD10: Negative 'Ki-67: High proliferation index (90%) /CD30: Negative /Cyclin D1: Negative C-MYC: Equivocal (20% staining seen on limited and fragmented tissue) /EBER: Negative   # Ritux-Pred- 9/11; 9/18- R-COP [held adria- sec to EF 45-50%]  #May 2021-PET scan left neck recurrent disease; excisional biopsy [Dr. Vaught]-recurrent diffuse left B-cell lymphoma  #November 2020-bilateral PE; on xarelto  #July 5 week, 2021- Gem-CarboDex [II opinion [facility]; Dr. Thera Flake; stem cell transplant eval]; no Rituxan-repeat CD20 negative.   # Sep 2020- Borderline low EF/Left ventricular hypokinesis [Dr.Gollan]-s October 9th stress test-nonischemic;   # DIAGNOSIS: DLBCL  STAGE:   4      ;GOALS: Cure  CURRENT/MOST RECENT THERAPY : R-COP/-R-CEOP. [C]    Lymphoma of lymph nodes ([place])  07/03/2019 Initial Diagnosis   Lymphoma of lymph nodes ([place])   07/13/2019 - 12/07/2019 Chemotherapy   The patient had palonosetron (ALOXI) injection 0.25 mg, 0.25 mg, Intravenous,  Once, 7 of 7 cycles Administration: 0.25 mg (07/20/2019), 0.25 mg (08/13/2019), 0.25 mg (11/13/2019), 0.25 mg (09/03/2019), 0.25 mg (10/02/2019), 0.25 mg (10/23/2019), 0.25 mg (12/04/2019) pegfilgrastim (NEULASTA ONPRO KIT)  injection 6 mg, 6 mg, Subcutaneous, Once, 2 of 2 cycles Administration: 6 mg (10/04/2019), 6 mg (10/25/2019) pegfilgrastim-cbqv (UDENYCA) injection 6 mg, 6 mg, Subcutaneous, Once, 5 of 5 cycles Administration: 6 mg (07/23/2019), 6 mg (08/16/2019), 6 mg (09/06/2019), 6 mg (11/16/2019), 6 mg (12/07/2019) vinCRIStine (ONCOVIN) 2 mg in sodium chloride 0.9 % 50 mL chemo infusion, 2 mg, Intravenous,  Once, 7 of 7 cycles Administration: 2 mg (07/20/2019), 2 mg (08/13/2019), 2 mg (11/13/2019), 2 mg (09/03/2019), 2 mg (10/02/2019), 2 mg (10/23/2019), 2 mg (12/04/2019) cyclophosphamide (CYTOXAN) 1,900 mg in sodium chloride 0.9 % 250 mL chemo infusion, 750 mg/m2 = 1,900 mg, Intravenous,  Once, 7 of 7 cycles Administration: 1,900 mg (07/20/2019), 1,900 mg (08/13/2019), 2,000 mg (11/13/2019), 1,900 mg (09/03/2019), 2,000 mg (10/02/2019), 2,000 mg (10/23/2019), 2,000 mg (12/04/2019) etoposide (VEPESID) 250 mg in sodium chloride 0.9 % 1,000 mL chemo infusion, 100 mg/m2 = 250 mg, Intravenous,  Once, 6 of 6 cycles Dose modification: 50 mg/m2 (original dose 100 mg/m2, Cycle 3, Reason: Provider Judgment) Administration: 130 mg (08/14/2019), 130 mg (08/15/2019), 130 mg (09/03/2019), 130 mg (09/04/2019), 130 mg (09/05/2019), 130 mg (10/03/2019), 130 mg (10/04/2019), 130 mg (10/24/2019), 130 mg (10/25/2019), 130 mg (11/13/2019), 130 mg (11/14/2019), 130 mg (11/15/2019), 130 mg (10/02/2019), 130 mg (10/23/2019), 130 mg (12/04/2019), 130 mg (12/05/2019), 130 mg (12/06/2019) dexamethasone (DECADRON) 12 mg in sodium chloride 0.9 % 145 mL IVPB, , Intravenous,  Once, 8 of 8 cycles Administration:  (07/13/2019),  (07/20/2019),  (08/13/2019),  (11/13/2019),  (09/03/2019),  (10/02/2019),  (10/23/2019),  (12/04/2019) riTUXimab-pvvr (RUXIENCE) 900 mg in sodium chloride 0.9 % 250 mL (2.6471 mg/mL) infusion, 375 mg/m2 = 900 mg (100 % of original dose 375 mg/m2), Intravenous,  Once, 1 of 1 cycle Dose modification: 375 mg/m2 (original dose 375  mg/m2, Cycle 1) Administration:  900 mg (07/13/2019)  for chemotherapy treatment.    04/25/2020 -  Chemotherapy   The patient had dexamethasone (DECADRON) 4 MG tablet, 40 mg, Oral, Daily, 0 of 1 cycle, Start date: --, End date: -- palonosetron (ALOXI) injection 0.25 mg, 0.25 mg, Intravenous,  Once, 0 of 4 cycles CARBOplatin (PARAPLATIN) in sodium chloride 0.9 % 100 mL chemo infusion, , Intravenous,  Once, 0 of 4 cycles gemcitabine (GEMZAR) 2,584 mg in sodium chloride 0.9 % 250 mL chemo infusion, 1,000 mg/m2, Intravenous,  Once, 0 of 4 cycles fosaprepitant (EMEND) 150 mg in sodium chloride 0.9 % 145 mL IVPB, 150 mg, Intravenous,  Once, 0 of 4 cycles  for chemotherapy treatment.      HISTORY OF PRESENTING ILLNESS:  Eric [name] 69 y.o.  male diffuse large B-cell lymphoma status post R-CEOP #6, currently recurrent diffuse B-cell lymphoma status post excisional biopsy of the left neck lymphadenopathy.  The interim patient has been evaluated by [facility] lymphoma service Dr. Noralee Stain; and also been evaluated by stem cell transplant service for potential for transplant post salvage therapy.  Patient denies any new lumps or bumps.  Appetite is good with no weight loss.  No nausea no vomiting.  No night sweats.   Review of Systems  Constitutional: Negative for chills, diaphoresis and fever.  HENT: Positive for hearing loss. Negative for nosebleeds and sore throat.   Eyes: Negative for double vision.  Respiratory: Negative for hemoptysis, sputum production and wheezing.   Cardiovascular: Negative for chest pain, palpitations, orthopnea and leg swelling.  Gastrointestinal: Negative for abdominal pain, blood in stool, constipation, diarrhea, heartburn, melena, nausea and vomiting.  Genitourinary: Negative for dysuria, frequency and urgency.  Musculoskeletal: Negative for back pain and joint pain.  Skin: Negative.  Negative for itching and rash.  Neurological: Negative for dizziness, tingling, focal weakness, weakness and headaches.   Endo/Heme/Allergies: Does not bruise/bleed easily.  Psychiatric/Behavioral: Negative for depression. The patient does not have insomnia.      MEDICAL HISTORY:  Past Medical History:  Diagnosis Date   Adopted person    Colon polyps    Pancreatitis    Rectal bleeding 02/24/14   Sepsis (HCC) 09/18/2019    SURGICAL HISTORY: Past Surgical History:  Procedure Laterality Date   CHOLECYSTECTOMY     COLONOSCOPY  02/28/14   IR IMAGING GUIDED PORT INSERTION  07/18/2019   LYMPH NODE BIOPSY Left 03/28/2020   Procedure: EXCISION OF SUBMANDIBULAR  LYMPH NODE;  Surgeon: Bud Face, MD;  [facility]: [facility];  Service: ENT;  Laterality: Left;   POLYPECTOMY     colon polyp removed   TONSILLECTOMY      SOCIAL HISTORY: Social History   Socioeconomic History   Marital status: Married    Spouse name: Not on file   Number of children: Not on file   Years of education: Not on file   Highest education level: Not on file  Occupational History    Comment: Retired from [employer]  Tobacco Use   Smoking status: Current Every Day Smoker    Packs/day: 1.00    Years: 20.00    Pack years: 20.00    Types: Cigars   Smokeless tobacco: Never Used   Tobacco comment: smoke 1/2 ppd cigaretes since 69 yo. changed to cigars at age July 2004.   Vaping Use   Vaping Use: Never used  Substance and Sexual Activity   Alcohol use: Yes    Comment: 6-7 drinks weekly   Drug  use: No   Sexual activity: Not on file  Other Topics Concern   Not on file  Social History Narrative   Lives at home with wife; [place]. retd from [employer] smoker; drinks liquor. Children - grown up.    Social Determinants of Health   Financial Resource Strain:    Difficulty of Paying Living Expenses:   Food Insecurity:    Worried About Charity fundraiser in the Last Year:    Arboriculturist in the Last Year:   Transportation Needs:    Film/video editor (Medical):    Lack of  Transportation (Non-Medical):   Physical Activity:    Days of Exercise per Week:    Minutes of Exercise per Session:   Stress:    Feeling of Stress :   Social Connections:    Frequency of Communication with Friends and Family:    Frequency of Social Gatherings with Friends and Family:    Attends Religious Services:    Active Member of Clubs or Organizations:    Attends Music therapist:    Marital Status:   Intimate Partner Violence:    Fear of Current or Ex-Partner:    Emotionally Abused:    Physically Abused:    Sexually Abused:     FAMILY HISTORY: Family History  Adopted: Yes    ALLERGIES:  is allergic to allopurinol, azithromycin, and losartan potassium.  MEDICATIONS:  Current Outpatient Medications  Medication Sig Dispense Refill   carvedilol (COREG) 6.25 MG tablet Take 1 tablet (6.25 mg total) by mouth 2 (two) times daily. 180 tablet 3   lidocaine-prilocaine (EMLA) cream Apply generously on the port 30-45 prior to accessing the port. 30 g 1   Multiple Vitamins-Minerals (MULTI-VITAMIN/MINERALS PO) Take 1 capsule by mouth.     UNABLE TO FIND Med Name: CBD oil     XARELTO 10 MG TABS tablet TAKE 1 TABLET BY MOUTH DAILY 30 tablet 1   dexamethasone (DECADRON) 4 MG tablet Take 10 tablets; once a day x 4 days- Start on the day of chemo 80 tablet 0   ondansetron (ZOFRAN) 4 MG tablet Take 1 tablet (4 mg total) by mouth every 8 (eight) hours as needed for up to 10 doses for nausea or vomiting. (Patient not taking: Reported on 04/03/2020) 20 tablet 0   No current facility-administered medications for this visit.      Marland Kitchen  PHYSICAL EXAMINATION: ECOG PERFORMANCE STATUS: 1 - Symptomatic but completely ambulatory  Vitals:   04/25/20 0931  BP: 119/70  Pulse: 74  SpO2: 99%   Filed Weights   04/25/20 0931  Weight: 291 lb 12.8 oz (132.4 kg)    Physical Exam HENT:     Head: Normocephalic and atraumatic.     Mouth/Throat:     Pharynx: No  oropharyngeal exudate.  Eyes:     Pupils: Pupils are equal, round, and reactive to light.  Neck:     Comments: Approximately 2 cm lymph node felt in the left submandibular region Cardiovascular:     Rate and Rhythm: Normal rate and regular rhythm.  Pulmonary:     Effort: Pulmonary effort is normal. No respiratory distress.     Breath sounds: Normal breath sounds. No wheezing.  Abdominal:     General: Bowel sounds are normal. There is no distension.     Palpations: Abdomen is soft. There is no mass.     Tenderness: There is no abdominal tenderness. There is no guarding or rebound.  Musculoskeletal:        General: No tenderness. Normal range of motion.     Cervical back: Normal range of motion and neck supple.  Skin:    General: Skin is warm.  Neurological:     Mental Status: He is alert and oriented to person, place, and time.  Psychiatric:        Mood and Affect: Affect normal.    LABORATORY DATA:  I have reviewed the data as listed Lab Results  Component Value Date   WBC 5.9 03/20/2020   HGB 13.5 03/20/2020   HCT 39.0 03/20/2020   MCV 88.6 03/20/2020   PLT 173 03/20/2020   Recent Labs    12/04/19 0801 01/15/20 1019 03/20/20 0957  NA 140 141 142  K 3.7 4.1 3.6  CL 108 104 105  CO2 '25 27 28  '$ GLUCOSE 101* 106* 134*  BUN '18 12 13  '$ CREATININE 0.77 0.78 0.81  CALCIUM 8.9 9.1 8.6*  GFRNONAA >60 >60 >60  GFRAA >60 >60 >60  PROT 6.4* 6.5 5.9*  ALBUMIN 3.9 4.1 3.8  AST '24 24 23  '$ ALT '25 25 31  '$ ALKPHOS 70 69 70  BILITOT 0.5 0.8 0.7    RADIOGRAPHIC STUDIES: I have personally reviewed the radiological images as listed and agreed with the findings in the report. No results found.  ASSESSMENT & PLAN:   Lymphoma of lymph nodes ([place]) #Relapsed diffuse large B-cell lymphoma-ABC subtype; IV [spleen]; currently s/p 6 cycles of RCEOP-finished late February 2021- PET scan may 19th,2021-left neck new lymphadenopathy SUV 5/upper 12 mm; s/p excisional biopsy-positive for  diffuse large B-cell lymphoma.  #Recommend salvage therapy-gemcitabine-carboplatin-day 1; gemcitabine day 8; every 21 days.  Dexamethasone 40 mg daily day 1 through day 4.  Recommend 2 cycles; with follow-up restaging PET scan.  Understands that goal of treatment is cure.   Discussed the potential side effects including but not limited to-increasing fatigue, nausea vomiting, diarrhea, hair loss, sores in the mouth, increase risk of infection and also neuropathy.    #If PET scan shows complete response patient proceed with autologous stem cell transplant.  If he does have suboptimal response would recommend-CAR-T therapy.  Patient has been evaluated at [place] service Dr. Thera Flake; also stem cell transplant service.  # Bil acute PE- [nov 2020]-currently on xarelto 10 mg/day/cardiology-continue the same for now.  # Borderline LOW EF of 45-50%; Sep Echo 50-55% [Dr.Gollan]- on coreg BID-stable   # DISPOSITION:  # Follow up in week of July 5th, MD; labs-cbc/cmp/ldh; Algis Greenhouse # day- 8; labs- cbc/cmp;Gem-Dr.B  # I reviewed the blood work- with the patient in detail; also reviewed the imaging independently [as summarized above]; and with the patient in detail.  Reviewed the records from Dr. Marin Roberts in detail.  All questions were answered. The patient knows to call the clinic with any problems, questions or concerns.    Cammie Sickle, MD 04/25/2020 10:11 AM

## 2020-04-28 ENCOUNTER — Encounter: Payer: Self-pay | Admitting: Internal Medicine

## 2020-04-30 ENCOUNTER — Other Ambulatory Visit: Payer: Self-pay | Admitting: Internal Medicine

## 2020-04-30 NOTE — Telephone Encounter (Signed)
FYI to CG

## 2020-05-09 ENCOUNTER — Other Ambulatory Visit: Payer: Self-pay | Admitting: *Deleted

## 2020-05-09 ENCOUNTER — Other Ambulatory Visit: Payer: Self-pay

## 2020-05-09 ENCOUNTER — Encounter: Payer: Self-pay | Admitting: Internal Medicine

## 2020-05-09 ENCOUNTER — Inpatient Hospital Stay: Payer: Medicare Other

## 2020-05-09 ENCOUNTER — Inpatient Hospital Stay (HOSPITAL_BASED_OUTPATIENT_CLINIC_OR_DEPARTMENT_OTHER): Payer: Medicare Other | Admitting: Internal Medicine

## 2020-05-09 ENCOUNTER — Inpatient Hospital Stay: Payer: Medicare Other | Attending: Internal Medicine

## 2020-05-09 VITALS — BP 131/76 | HR 54 | Resp 16

## 2020-05-09 DIAGNOSIS — F329 Major depressive disorder, single episode, unspecified: Secondary | ICD-10-CM | POA: Insufficient documentation

## 2020-05-09 DIAGNOSIS — C8590 Non-Hodgkin lymphoma, unspecified, unspecified site: Secondary | ICD-10-CM

## 2020-05-09 DIAGNOSIS — Z5111 Encounter for antineoplastic chemotherapy: Secondary | ICD-10-CM | POA: Diagnosis not present

## 2020-05-09 DIAGNOSIS — Z7901 Long term (current) use of anticoagulants: Secondary | ICD-10-CM | POA: Diagnosis not present

## 2020-05-09 DIAGNOSIS — C8338 Diffuse large B-cell lymphoma, lymph nodes of multiple sites: Secondary | ICD-10-CM | POA: Insufficient documentation

## 2020-05-09 DIAGNOSIS — Z86711 Personal history of pulmonary embolism: Secondary | ICD-10-CM | POA: Diagnosis not present

## 2020-05-09 LAB — COMPREHENSIVE METABOLIC PANEL
ALT: 25 U/L (ref 0–44)
AST: 21 U/L (ref 15–41)
Albumin: 4.1 g/dL (ref 3.5–5.0)
Alkaline Phosphatase: 74 U/L (ref 38–126)
Anion gap: 9 (ref 5–15)
BUN: 19 mg/dL (ref 8–23)
CO2: 29 mmol/L (ref 22–32)
Calcium: 8.9 mg/dL (ref 8.9–10.3)
Chloride: 104 mmol/L (ref 98–111)
Creatinine, Ser: 0.95 mg/dL (ref 0.61–1.24)
GFR calc Af Amer: >60 mL/min (ref >60)
GFR calc non Af Amer: >60 mL/min (ref >60)
Glucose, Bld: 107 mg/dL — ABNORMAL HIGH (ref 70–99)
Potassium: 4.1 mmol/L (ref 3.5–5.1)
Sodium: 142 mmol/L (ref 135–145)
Total Bilirubin: 1.2 mg/dL (ref 0.3–1.2)
Total Protein: 6.7 g/dL (ref 6.5–8.1)

## 2020-05-09 LAB — CBC WITH DIFFERENTIAL/PLATELET
Abs Immature Granulocytes: 0.06 10*3/uL (ref 0.00–0.07)
Basophils Absolute: 0 10*3/uL (ref 0.0–0.1)
Basophils Relative: 0 %
Eosinophils Absolute: 0.3 10*3/uL (ref 0.0–0.5)
Eosinophils Relative: 4 %
HCT: 38.7 % — ABNORMAL LOW (ref 39.0–52.0)
Hemoglobin: 13.9 g/dL (ref 13.0–17.0)
Immature Granulocytes: 1 %
Lymphocytes Relative: 14 %
Lymphs Abs: 1 10*3/uL (ref 0.7–4.0)
MCH: 31.7 pg (ref 26.0–34.0)
MCHC: 35.9 g/dL (ref 30.0–36.0)
MCV: 88.2 fL (ref 80.0–100.0)
Monocytes Absolute: 0.7 10*3/uL (ref 0.1–1.0)
Monocytes Relative: 10 %
Neutro Abs: 4.8 10*3/uL (ref 1.7–7.7)
Neutrophils Relative %: 71 %
Platelets: 161 10*3/uL (ref 150–400)
RBC: 4.39 MIL/uL (ref 4.22–5.81)
RDW: 15 % (ref 11.5–15.5)
WBC: 6.8 10*3/uL (ref 4.0–10.5)
nRBC: 0 % (ref 0.0–0.2)

## 2020-05-09 LAB — LACTATE DEHYDROGENASE: LDH: 158 U/L (ref 98–192)

## 2020-05-09 MED ORDER — PALONOSETRON HCL INJECTION 0.25 MG/5ML
0.2500 mg | Freq: Once | INTRAVENOUS | Status: AC
  Administered 2020-05-09: 0.25 mg via INTRAVENOUS

## 2020-05-09 MED ORDER — SODIUM CHLORIDE 0.9 % IV SOLN
Freq: Once | INTRAVENOUS | Status: AC
  Filled 2020-05-09: qty 250

## 2020-05-09 MED ORDER — SODIUM CHLORIDE 0.9 % IV SOLN
2500.0000 mg | Freq: Once | INTRAVENOUS | Status: AC
  Administered 2020-05-09: 2500 mg via INTRAVENOUS
  Filled 2020-05-09: qty 52.6

## 2020-05-09 MED ORDER — HEPARIN SOD (PORK) LOCK FLUSH 100 UNIT/ML IV SOLN
500.0000 [IU] | Freq: Once | INTRAVENOUS | Status: AC
  Administered 2020-05-09: 500 [IU] via INTRAVENOUS
  Filled 2020-05-09: qty 5

## 2020-05-09 MED ORDER — HEPARIN SOD (PORK) LOCK FLUSH 100 UNIT/ML IV SOLN
INTRAVENOUS | Status: AC
  Filled 2020-05-09: qty 5

## 2020-05-09 MED ORDER — SODIUM CHLORIDE 0.9 % IV SOLN
150.0000 mg | Freq: Once | INTRAVENOUS | Status: AC
  Administered 2020-05-09: 150 mg via INTRAVENOUS
  Filled 2020-05-09: qty 5

## 2020-05-09 MED ORDER — SODIUM CHLORIDE 0.9 % IV SOLN
750.0000 mg | Freq: Once | INTRAVENOUS | Status: AC
  Administered 2020-05-09: 750 mg via INTRAVENOUS
  Filled 2020-05-09: qty 75

## 2020-05-09 MED ORDER — SODIUM CHLORIDE 0.9 % IV SOLN
20.0000 mg | Freq: Once | INTRAVENOUS | Status: AC
  Administered 2020-05-09: 20 mg via INTRAVENOUS
  Filled 2020-05-09: qty 20

## 2020-05-09 NOTE — Progress Notes (Signed)
[place] NOTE  Patient Care Team: [name], MD as PCP - General (Family Medicine) [name], [name], MD (General Surgery) Cammie Sickle, MD as Consulting Physician (Internal Medicine)  CHIEF COMPLAINTS/PURPOSE OF CONSULTATION: Diffuse large B-cell lymphoma  #  Oncology History Overview Note  #June 29, 2019-CT- bulky bilateral neck adenopathy left more than right; September 2020-Diffuse B-cell lymphoma; ABC subtype; NEGATIVE FISH studies.  PET scan -bulky adenopathy involving neck /lymphadenopathy mediastinum /abdomen/pelvis inguinal lymph nodes; splenic involvement.  Question bone.  Stage IV [spleen involvement; no bone marrow biopsy].  BCL-2: Positive /BCL 6: Positive /MUM-1: Positive /CD10: Negative 'Ki-67: High proliferation index (90%) /CD30: Negative /Cyclin D1: Negative C-MYC: Equivocal (20% staining seen on limited and fragmented tissue) /EBER: Negative   # Ritux-Pred- 9/11; 9/18- R-COP [held adria- sec to EF 45-50%]  #May 2021-PET scan left neck recurrent disease; excisional biopsy [Dr. Vaught]-recurrent diffuse left B-cell lymphoma  #November 2020-bilateral PE; on xarelto  #J07/07/2020- 2021- Gem-CarboDex [II opinion [facility]; Dr. Thera Flake; stem cell transplant eval]; no Rituxan-repeat CD20 negative.     # Sep 2020- Borderline low EF/Left ventricular hypokinesis [Dr.Gollan]-s October 9th stress test-nonischemic;   # DIAGNOSIS: DLBCL  STAGE:   4      ;GOALS: Cure  CURRENT/MOST RECENT THERAPY : R-COP/-R-CEOP. [C]    Lymphoma of lymph nodes ([place])  07/03/2019 Initial Diagnosis   Lymphoma of lymph nodes ([place])   07/13/2019 - 12/07/2019 Chemotherapy   The patient had palonosetron (ALOXI) injection 0.25 mg, 0.25 mg, Intravenous,  Once, 7 of 7 cycles Administration: 0.25 mg (07/20/2019), 0.25 mg (08/13/2019), 0.25 mg (11/13/2019), 0.25 mg (09/03/2019), 0.25 mg (10/02/2019), 0.25 mg (10/23/2019), 0.25 mg (12/04/2019) pegfilgrastim (NEULASTA ONPRO KIT)  injection 6 mg, 6 mg, Subcutaneous, Once, 2 of 2 cycles Administration: 6 mg (10/04/2019), 6 mg (10/25/2019) pegfilgrastim-cbqv (UDENYCA) injection 6 mg, 6 mg, Subcutaneous, Once, 5 of 5 cycles Administration: 6 mg (07/23/2019), 6 mg (08/16/2019), 6 mg (09/06/2019), 6 mg (11/16/2019), 6 mg (12/07/2019) vinCRIStine (ONCOVIN) 2 mg in sodium chloride 0.9 % 50 mL chemo infusion, 2 mg, Intravenous,  Once, 7 of 7 cycles Administration: 2 mg (07/20/2019), 2 mg (08/13/2019), 2 mg (11/13/2019), 2 mg (09/03/2019), 2 mg (10/02/2019), 2 mg (10/23/2019), 2 mg (12/04/2019) cyclophosphamide (CYTOXAN) 1,900 mg in sodium chloride 0.9 % 250 mL chemo infusion, 750 mg/m2 = 1,900 mg, Intravenous,  Once, 7 of 7 cycles Administration: 1,900 mg (07/20/2019), 1,900 mg (08/13/2019), 2,000 mg (11/13/2019), 1,900 mg (09/03/2019), 2,000 mg (10/02/2019), 2,000 mg (10/23/2019), 2,000 mg (12/04/2019) etoposide (VEPESID) 250 mg in sodium chloride 0.9 % 1,000 mL chemo infusion, 100 mg/m2 = 250 mg, Intravenous,  Once, 6 of 6 cycles Dose modification: 50 mg/m2 (original dose 100 mg/m2, Cycle 3, Reason: Provider Judgment) Administration: 130 mg (08/14/2019), 130 mg (08/15/2019), 130 mg (09/03/2019), 130 mg (09/04/2019), 130 mg (09/05/2019), 130 mg (10/03/2019), 130 mg (10/04/2019), 130 mg (10/24/2019), 130 mg (10/25/2019), 130 mg (11/13/2019), 130 mg (11/14/2019), 130 mg (11/15/2019), 130 mg (10/02/2019), 130 mg (10/23/2019), 130 mg (12/04/2019), 130 mg (12/05/2019), 130 mg (12/06/2019) dexamethasone (DECADRON) 12 mg in sodium chloride 0.9 % 145 mL IVPB, , Intravenous,  Once, 8 of 8 cycles Administration:  (07/13/2019),  (07/20/2019),  (08/13/2019),  (11/13/2019),  (09/03/2019),  (10/02/2019),  (10/23/2019),  (12/04/2019) riTUXimab-pvvr (RUXIENCE) 900 mg in sodium chloride 0.9 % 250 mL (2.6471 mg/mL) infusion, 375 mg/m2 = 900 mg (100 % of original dose 375 mg/m2), Intravenous,  Once, 1 of 1 cycle Dose modification: 375 mg/m2 (original dose 375  mg/m2, Cycle 1) Administration:  900 mg (07/13/2019)  for chemotherapy treatment.    05/09/2020 -  Chemotherapy   The patient had palonosetron (ALOXI) injection 0.25 mg, 0.25 mg, Intravenous,  Once, 0 of 4 cycles CARBOplatin (PARAPLATIN) 750 mg in sodium chloride 0.9 % 250 mL chemo infusion, 750 mg (100 % of original dose 750 mg), Intravenous,  Once, 0 of 4 cycles Dose modification:   (original dose 750 mg, Cycle 1, Reason: Provider Judgment, Comment: labs from care every where. ) gemcitabine (GEMZAR) 2,584 mg in sodium chloride 0.9 % 250 mL chemo infusion, 1,000 mg/m2 = 2,584 mg, Intravenous,  Once, 0 of 4 cycles fosaprepitant (EMEND) 150 mg in sodium chloride 0.9 % 145 mL IVPB, 150 mg, Intravenous,  Once, 0 of 4 cycles  for chemotherapy treatment.      HISTORY OF PRESENTING ILLNESS:  Eric [name] 69 y.o.  male relapsed diffuse large B-cell lymphoma-is here for follow-up/proceed with salvage chemotherapy.  Patient notes of improvement from his neck incisional pain from biopsy.  Denies any new lumps or bumps.  Denies any nausea vomiting abdominal pain.  Denies any chest pain or shortness of breath or cough.  However he does mention of anxiety/depression/anger that he has to go through treatments all over again.  Otherwise he is coping fairly well.  Review of Systems  Constitutional: Negative for chills, diaphoresis and fever.  HENT: Positive for hearing loss. Negative for nosebleeds and sore throat.   Eyes: Negative for double vision.  Respiratory: Negative for hemoptysis, sputum production and wheezing.   Cardiovascular: Negative for chest pain, palpitations, orthopnea and leg swelling.  Gastrointestinal: Negative for abdominal pain, blood in stool, constipation, diarrhea, heartburn, melena, nausea and vomiting.  Genitourinary: Negative for dysuria, frequency and urgency.  Musculoskeletal: Negative for back pain and joint pain.  Skin: Negative.  Negative for itching and rash.  Neurological: Negative for dizziness,  tingling, focal weakness, weakness and headaches.  Endo/Heme/Allergies: Does not bruise/bleed easily.  Psychiatric/Behavioral: Negative for depression. The patient does not have insomnia.      MEDICAL HISTORY:  Past Medical History:  Diagnosis Date  . Adopted person   . Colon polyps   . Pancreatitis   . Rectal bleeding 02/24/14  . Sepsis ([place]) 09/18/2019    SURGICAL HISTORY: Past Surgical History:  Procedure Laterality Date  . CHOLECYSTECTOMY    . COLONOSCOPY  02/28/14  . IR IMAGING GUIDED PORT INSERTION  07/18/2019  . LYMPH NODE BIOPSY Left 03/28/2020   Procedure: EXCISION OF SUBMANDIBULAR  LYMPH NODE;  Surgeon: Carloyn Manner, MD;  Location: [facility];  Service: ENT;  Laterality: Left;  . POLYPECTOMY     colon polyp removed  . TONSILLECTOMY      SOCIAL HISTORY: Social History   Socioeconomic History  . Marital status: Married    Spouse name: Not on file  . Number of children: Not on file  . Years of education: Not on file  . Highest education level: Not on file  Occupational History    Comment: Retired from [employer]  . Smoking status: Current Every Day Smoker    Packs/day: 1.00    Years: 20.00    Pack years: 20.00    Types: Cigars  . Smokeless tobacco: Never Used  . Tobacco comment: smoke 1/2 ppd cigaretes since 69 yo. changed to cigars at age July 2004.   Vaping Use  . Vaping Use: Never used  Substance and Sexual Activity  . Alcohol use: Yes  Comment: 6-7 drinks weekly  . Drug use: No  . Sexual activity: Not on file  Other Topics Concern  . Not on file  Social History Narrative   Lives at home with wife; in [place]. retd from [employer] smoker; drinks liquor. Children - grown up.    Social Determinants of Health   Financial Resource Strain:   . Difficulty of Paying Living Expenses:   Food Insecurity:   . Worried About Charity fundraiser in the Last Year:   . Arboriculturist in the Last Year:   Transportation Needs:    . Film/video editor (Medical):   Marland Kitchen Lack of Transportation (Non-Medical):   Physical Activity:   . Days of Exercise per Week:   . Minutes of Exercise per Session:   Stress:   . Feeling of Stress :   Social Connections:   . Frequency of Communication with Friends and Family:   . Frequency of Social Gatherings with Friends and Family:   . Attends Religious Services:   . Active Member of Clubs or Organizations:   . Attends Archivist Meetings:   Marland Kitchen Marital Status:   Intimate Partner Violence:   . Fear of Current or Ex-Partner:   . Emotionally Abused:   Marland Kitchen Physically Abused:   . Sexually Abused:     FAMILY HISTORY: Family History  Adopted: Yes    ALLERGIES:  is allergic to allopurinol, azithromycin, and losartan potassium.  MEDICATIONS:  Current Outpatient Medications  Medication Sig Dispense Refill  . carvedilol (COREG) 6.25 MG tablet Take 1 tablet (6.25 mg total) by mouth 2 (two) times daily. 180 tablet 3  . dexamethasone (DECADRON) 4 MG tablet Take 10 tablets; once a day x 4 days- Start on the day of chemo 80 tablet 0  . lidocaine-prilocaine (EMLA) cream Apply generously on the port 30-45 prior to accessing the port. 30 g 1  . Multiple Vitamins-Minerals (MULTI-VITAMIN/MINERALS PO) Take 1 capsule by mouth.    Marland Kitchen UNABLE TO FIND Med Name: CBD oil    . XARELTO 10 MG TABS tablet TAKE 1 TABLET BY MOUTH DAILY 30 tablet 1  . ondansetron (ZOFRAN) 4 MG tablet Take 1 tablet (4 mg total) by mouth every 8 (eight) hours as needed for up to 10 doses for nausea or vomiting. (Patient not taking: Reported on 05/09/2020) 20 tablet 0   No current facility-administered medications for this visit.      Marland Kitchen  PHYSICAL EXAMINATION: ECOG PERFORMANCE STATUS: 1 - Symptomatic but completely ambulatory  Vitals:   05/09/20 0953  BP: 117/76  Pulse: 69  Resp: 16  Temp: 98.8 F (37.1 C)  SpO2: 98%   Filed Weights   05/09/20 0953  Weight: 291 lb (132 kg)    Physical Exam HENT:      Head: Normocephalic and atraumatic.     Mouth/Throat:     Pharynx: No oropharyngeal exudate.  Eyes:     Pupils: Pupils are equal, round, and reactive to light.  Neck:     Comments: Approximately 2 cm lymph node felt in the left submandibular region Cardiovascular:     Rate and Rhythm: Normal rate and regular rhythm.  Pulmonary:     Effort: Pulmonary effort is normal. No respiratory distress.     Breath sounds: Normal breath sounds. No wheezing.  Abdominal:     General: Bowel sounds are normal. There is no distension.     Palpations: Abdomen is soft. There is no mass.  Tenderness: There is no abdominal tenderness. There is no guarding or rebound.  Musculoskeletal:        General: No tenderness. Normal range of motion.     Cervical back: Normal range of motion and neck supple.  Skin:    General: Skin is warm.  Neurological:     Mental Status: He is alert and oriented to person, place, and time.  Psychiatric:        Mood and Affect: Affect normal.    LABORATORY DATA:  I have reviewed the data as listed Lab Results  Component Value Date   WBC 6.8 05/09/2020   HGB 13.9 05/09/2020   HCT 38.7 (L) 05/09/2020   MCV 88.2 05/09/2020   PLT 161 05/09/2020   Recent Labs    01/15/20 1019 03/20/20 0957 05/09/20 0942  NA 141 142 142  K 4.1 3.6 4.1  CL 104 105 104  CO2 '27 28 29  '$ GLUCOSE 106* 134* 107*  BUN '12 13 19  '$ CREATININE 0.78 0.81 0.95  CALCIUM 9.1 8.6* 8.9  GFRNONAA >60 >60 >60  GFRAA >60 >60 >60  PROT 6.5 5.9* 6.7  ALBUMIN 4.1 3.8 4.1  AST '24 23 21  '$ ALT '25 31 25  '$ ALKPHOS 69 70 74  BILITOT 0.8 0.7 1.2    RADIOGRAPHIC STUDIES: I have personally reviewed the radiological images as listed and agreed with the findings in the report. No results found.  ASSESSMENT & PLAN:   Lymphoma of lymph nodes ([place]) #Relapsed diffuse large B-cell lymphoma-ABC subtype; IV [spleen];  PET scan may 19th,2021-left neck new lymphadenopathy SUV 5/upper 12 mm; s/p excisional  biopsy-positive for diffuse large B-cell lymphoma.  # Plan to START-today- Gemcitabine-carboplatin-day 1; gemcitabine day 8; every 21 days.  Dexamethasone 40 mg daily day 1 through day 4. Will plan 2 cycles; will plan PET.  Patient has prescription for dexamethasone.  Discussed the potential side effects including but not limited to-increasing fatigue, nausea vomiting, diarrhea, hair loss, sores in the mouth, increase risk of infection and also neuropathy.   # Bil acute PE- [nov 2020]-currently on xarelto 10 mg/day/cardiology-STABLE.   # Borderline LOW EF of 45-50%; Sep Echo 50-55% [Dr.Gollan]- on coreg BID-STABLE.   # Anixiety/depression/social support-recommend reaching out to Geophysical data processor; also recommend reaching out to bone marrow transplantation team Education officer, museum at [employer].  #Recommend time off during intensive chemotherapy for next 2 months.  Papers to be filled out.  # DISPOSITION:  # chemo today # cancel 7/12 appt # 1 week MD; labs-cbc/cmp/ldh; Elissa Hefty # in 3 weeks- MD; labs- cbc/cmp;Gem-carbo-Dr.B    All questions were answered. The patient knows to call the clinic with any problems, questions or concerns.    Cammie Sickle, MD 05/09/2020 10:49 AM

## 2020-05-09 NOTE — Assessment & Plan Note (Addendum)
#  Relapsed diffuse large B-cell lymphoma-ABC subtype; IV [spleen];  PET scan may 19th,2021-left neck new lymphadenopathy SUV 5/upper 12 mm; s/p excisional biopsy-positive for diffuse large B-cell lymphoma.  # Plan to START-today- Gemcitabine-carboplatin-day 1; gemcitabine day 8; every 21 days.  Dexamethasone 40 mg daily day 1 through day 4. Will plan 2 cycles; will plan PET.  Patient has prescription for dexamethasone.  Discussed the potential side effects including but not limited to-increasing fatigue, nausea vomiting, diarrhea, hair loss, sores in the mouth, increase risk of infection and also neuropathy.   # Bil acute PE- [nov 2020]-currently on xarelto 10 mg/day/cardiology-STABLE.   # Borderline LOW EF of 45-50%; Sep Echo 50-55% [Dr.Gollan]- on coreg BID-STABLE.   # Anixiety/depression/social support-recommend reaching out to Geophysical data processor; also recommend reaching out to bone marrow transplantation team Education officer, museum at [employer].  #Recommend time off during intensive chemotherapy for next 2 months.  Papers to be filled out.  # DISPOSITION:  # chemo today # cancel 7/12 appt # 1 week MD; labs-cbc/cmp/ldh; Elissa Hefty # in 3 weeks- MD; labs- cbc/cmp;Gem-carbo-Dr.B

## 2020-05-12 ENCOUNTER — Inpatient Hospital Stay: Payer: Medicare Other

## 2020-05-16 ENCOUNTER — Other Ambulatory Visit: Payer: Self-pay

## 2020-05-16 ENCOUNTER — Encounter: Payer: Self-pay | Admitting: *Deleted

## 2020-05-16 ENCOUNTER — Inpatient Hospital Stay: Payer: Medicare Other

## 2020-05-16 VITALS — BP 123/76 | HR 64 | Temp 97.0°F | Wt 292.4 lb

## 2020-05-16 DIAGNOSIS — Z7901 Long term (current) use of anticoagulants: Secondary | ICD-10-CM | POA: Diagnosis not present

## 2020-05-16 DIAGNOSIS — Z5111 Encounter for antineoplastic chemotherapy: Secondary | ICD-10-CM | POA: Diagnosis not present

## 2020-05-16 DIAGNOSIS — Z95828 Presence of other vascular implants and grafts: Secondary | ICD-10-CM

## 2020-05-16 DIAGNOSIS — Z86711 Personal history of pulmonary embolism: Secondary | ICD-10-CM | POA: Diagnosis not present

## 2020-05-16 DIAGNOSIS — C8590 Non-Hodgkin lymphoma, unspecified, unspecified site: Secondary | ICD-10-CM

## 2020-05-16 DIAGNOSIS — C8338 Diffuse large B-cell lymphoma, lymph nodes of multiple sites: Secondary | ICD-10-CM | POA: Diagnosis not present

## 2020-05-16 LAB — CBC WITH DIFFERENTIAL/PLATELET
Abs Immature Granulocytes: 0.03 K/uL (ref 0.00–0.07)
Basophils Absolute: 0 K/uL (ref 0.0–0.1)
Basophils Relative: 0 %
Eosinophils Absolute: 0.2 K/uL (ref 0.0–0.5)
Eosinophils Relative: 4 %
HCT: 38.5 % — ABNORMAL LOW (ref 39.0–52.0)
Hemoglobin: 13.5 g/dL (ref 13.0–17.0)
Immature Granulocytes: 1 %
Lymphocytes Relative: 17 %
Lymphs Abs: 0.9 K/uL (ref 0.7–4.0)
MCH: 31 pg (ref 26.0–34.0)
MCHC: 35.1 g/dL (ref 30.0–36.0)
MCV: 88.5 fL (ref 80.0–100.0)
Monocytes Absolute: 0.2 K/uL (ref 0.1–1.0)
Monocytes Relative: 3 %
Neutro Abs: 3.9 K/uL (ref 1.7–7.7)
Neutrophils Relative %: 75 %
Platelets: 118 K/uL — ABNORMAL LOW (ref 150–400)
RBC: 4.35 MIL/uL (ref 4.22–5.81)
RDW: 14.2 % (ref 11.5–15.5)
WBC: 5.2 K/uL (ref 4.0–10.5)
nRBC: 0 % (ref 0.0–0.2)

## 2020-05-16 LAB — COMPREHENSIVE METABOLIC PANEL
ALT: 36 U/L (ref 0–44)
AST: 21 U/L (ref 15–41)
Albumin: 3.8 g/dL (ref 3.5–5.0)
Alkaline Phosphatase: 77 U/L (ref 38–126)
Anion gap: 9 (ref 5–15)
BUN: 20 mg/dL (ref 8–23)
CO2: 27 mmol/L (ref 22–32)
Calcium: 8.6 mg/dL — ABNORMAL LOW (ref 8.9–10.3)
Chloride: 102 mmol/L (ref 98–111)
Creatinine, Ser: 0.78 mg/dL (ref 0.61–1.24)
GFR calc Af Amer: >60 mL/min (ref >60)
GFR calc non Af Amer: >60 mL/min (ref >60)
Glucose, Bld: 103 mg/dL — ABNORMAL HIGH (ref 70–99)
Potassium: 4 mmol/L (ref 3.5–5.1)
Sodium: 138 mmol/L (ref 135–145)
Total Bilirubin: 0.9 mg/dL (ref 0.3–1.2)
Total Protein: 6.3 g/dL — ABNORMAL LOW (ref 6.5–8.1)

## 2020-05-16 LAB — LACTATE DEHYDROGENASE: LDH: 133 U/L (ref 98–192)

## 2020-05-16 MED ORDER — SODIUM CHLORIDE 0.9 % IV SOLN
Freq: Once | INTRAVENOUS | Status: AC
  Filled 2020-05-16: qty 250

## 2020-05-16 MED ORDER — HEPARIN SOD (PORK) LOCK FLUSH 100 UNIT/ML IV SOLN
500.0000 [IU] | Freq: Once | INTRAVENOUS | Status: AC | PRN
  Administered 2020-05-16: 500 [IU]
  Filled 2020-05-16: qty 5

## 2020-05-16 MED ORDER — PROCHLORPERAZINE MALEATE 10 MG PO TABS
10.0000 mg | ORAL_TABLET | Freq: Once | ORAL | Status: AC
  Administered 2020-05-16: 10 mg via ORAL
  Filled 2020-05-16: qty 1

## 2020-05-16 MED ORDER — HEPARIN SOD (PORK) LOCK FLUSH 100 UNIT/ML IV SOLN
INTRAVENOUS | Status: AC
  Filled 2020-05-16: qty 5

## 2020-05-16 MED ORDER — SODIUM CHLORIDE 0.9 % IV SOLN
2500.0000 mg | Freq: Once | INTRAVENOUS | Status: AC
  Administered 2020-05-16: 2500 mg via INTRAVENOUS
  Filled 2020-05-16: qty 52.6

## 2020-05-16 MED ORDER — SODIUM CHLORIDE 0.9% FLUSH
10.0000 mL | INTRAVENOUS | Status: DC | PRN
  Administered 2020-05-16: 10 mL via INTRAVENOUS
  Filled 2020-05-16: qty 10

## 2020-05-20 ENCOUNTER — Encounter: Payer: Self-pay | Admitting: Internal Medicine

## 2020-05-21 ENCOUNTER — Telehealth: Payer: Self-pay | Admitting: *Deleted

## 2020-05-21 MED ORDER — RIVAROXABAN 10 MG PO TABS: 10.0000 mg | ORAL_TABLET | Freq: Every day | ORAL | 3 refills | Status: DC

## 2020-05-21 NOTE — Telephone Encounter (Signed)
Requested RF on xarelto

## 2020-05-30 ENCOUNTER — Inpatient Hospital Stay: Payer: Medicare Other

## 2020-05-30 ENCOUNTER — Inpatient Hospital Stay (HOSPITAL_BASED_OUTPATIENT_CLINIC_OR_DEPARTMENT_OTHER): Payer: Medicare Other | Admitting: Internal Medicine

## 2020-05-30 ENCOUNTER — Other Ambulatory Visit: Payer: Self-pay

## 2020-05-30 VITALS — BP 121/74 | HR 60 | Resp 16

## 2020-05-30 DIAGNOSIS — C8590 Non-Hodgkin lymphoma, unspecified, unspecified site: Secondary | ICD-10-CM

## 2020-05-30 DIAGNOSIS — Z86711 Personal history of pulmonary embolism: Secondary | ICD-10-CM | POA: Diagnosis not present

## 2020-05-30 DIAGNOSIS — Z5111 Encounter for antineoplastic chemotherapy: Secondary | ICD-10-CM | POA: Diagnosis not present

## 2020-05-30 DIAGNOSIS — C8338 Diffuse large B-cell lymphoma, lymph nodes of multiple sites: Secondary | ICD-10-CM | POA: Diagnosis not present

## 2020-05-30 DIAGNOSIS — Z95828 Presence of other vascular implants and grafts: Secondary | ICD-10-CM

## 2020-05-30 DIAGNOSIS — Z7901 Long term (current) use of anticoagulants: Secondary | ICD-10-CM | POA: Diagnosis not present

## 2020-05-30 LAB — CBC WITH DIFFERENTIAL/PLATELET
Abs Immature Granulocytes: 0.05 10*3/uL (ref 0.00–0.07)
Basophils Absolute: 0 10*3/uL (ref 0.0–0.1)
Basophils Relative: 0 %
Eosinophils Absolute: 0.1 10*3/uL (ref 0.0–0.5)
Eosinophils Relative: 1 %
HCT: 36.1 % — ABNORMAL LOW (ref 39.0–52.0)
Hemoglobin: 12.6 g/dL — ABNORMAL LOW (ref 13.0–17.0)
Immature Granulocytes: 1 %
Lymphocytes Relative: 20 %
Lymphs Abs: 0.7 10*3/uL (ref 0.7–4.0)
MCH: 31.4 pg (ref 26.0–34.0)
MCHC: 34.9 g/dL (ref 30.0–36.0)
MCV: 90 fL (ref 80.0–100.0)
Monocytes Absolute: 0.5 10*3/uL (ref 0.1–1.0)
Monocytes Relative: 14 %
Neutro Abs: 2.3 10*3/uL (ref 1.7–7.7)
Neutrophils Relative %: 64 %
Platelets: 284 10*3/uL (ref 150–400)
RBC: 4.01 MIL/uL — ABNORMAL LOW (ref 4.22–5.81)
RDW: 15.8 % — ABNORMAL HIGH (ref 11.5–15.5)
WBC: 3.6 10*3/uL — ABNORMAL LOW (ref 4.0–10.5)
nRBC: 0 % (ref 0.0–0.2)

## 2020-05-30 LAB — COMPREHENSIVE METABOLIC PANEL
ALT: 28 U/L (ref 0–44)
AST: 22 U/L (ref 15–41)
Albumin: 4 g/dL (ref 3.5–5.0)
Alkaline Phosphatase: 76 U/L (ref 38–126)
Anion gap: 9 (ref 5–15)
BUN: 17 mg/dL (ref 8–23)
CO2: 26 mmol/L (ref 22–32)
Calcium: 8.8 mg/dL — ABNORMAL LOW (ref 8.9–10.3)
Chloride: 103 mmol/L (ref 98–111)
Creatinine, Ser: 0.86 mg/dL (ref 0.61–1.24)
GFR calc Af Amer: >60 mL/min (ref >60)
GFR calc non Af Amer: >60 mL/min (ref >60)
Glucose, Bld: 136 mg/dL — ABNORMAL HIGH (ref 70–99)
Potassium: 3.8 mmol/L (ref 3.5–5.1)
Sodium: 138 mmol/L (ref 135–145)
Total Bilirubin: 1 mg/dL (ref 0.3–1.2)
Total Protein: 6.3 g/dL — ABNORMAL LOW (ref 6.5–8.1)

## 2020-05-30 LAB — LACTATE DEHYDROGENASE: LDH: 206 U/L — ABNORMAL HIGH (ref 98–192)

## 2020-05-30 MED ORDER — HEPARIN SOD (PORK) LOCK FLUSH 100 UNIT/ML IV SOLN
INTRAVENOUS | Status: AC
  Filled 2020-05-30: qty 5

## 2020-05-30 MED ORDER — SODIUM CHLORIDE 0.9 % IV SOLN
750.0000 mg | Freq: Once | INTRAVENOUS | Status: AC
  Administered 2020-05-30: 750 mg via INTRAVENOUS
  Filled 2020-05-30: qty 75

## 2020-05-30 MED ORDER — PALONOSETRON HCL INJECTION 0.25 MG/5ML
0.2500 mg | Freq: Once | INTRAVENOUS | Status: AC
  Administered 2020-05-30: 0.25 mg via INTRAVENOUS
  Filled 2020-05-30: qty 5

## 2020-05-30 MED ORDER — SODIUM CHLORIDE 0.9 % IV SOLN
20.0000 mg | Freq: Once | INTRAVENOUS | Status: AC
  Administered 2020-05-30: 20 mg via INTRAVENOUS
  Filled 2020-05-30: qty 20

## 2020-05-30 MED ORDER — DEXAMETHASONE 4 MG PO TABS: ORAL_TABLET | ORAL | 0 refills | Status: DC

## 2020-05-30 MED ORDER — SODIUM CHLORIDE 0.9 % IV SOLN
Freq: Once | INTRAVENOUS | Status: AC
  Filled 2020-05-30: qty 250

## 2020-05-30 MED ORDER — SODIUM CHLORIDE 0.9 % IV SOLN
2500.0000 mg | Freq: Once | INTRAVENOUS | Status: AC
  Administered 2020-05-30: 2500 mg via INTRAVENOUS
  Filled 2020-05-30: qty 52.6

## 2020-05-30 MED ORDER — SODIUM CHLORIDE 0.9% FLUSH
10.0000 mL | INTRAVENOUS | Status: DC | PRN
  Administered 2020-05-30: 10 mL via INTRAVENOUS
  Filled 2020-05-30: qty 10

## 2020-05-30 MED ORDER — SODIUM CHLORIDE 0.9 % IV SOLN
150.0000 mg | Freq: Once | INTRAVENOUS | Status: AC
  Administered 2020-05-30: 150 mg via INTRAVENOUS
  Filled 2020-05-30: qty 150

## 2020-05-30 MED ORDER — HEPARIN SOD (PORK) LOCK FLUSH 100 UNIT/ML IV SOLN
500.0000 [IU] | Freq: Once | INTRAVENOUS | Status: AC | PRN
  Administered 2020-05-30: 500 [IU]
  Filled 2020-05-30: qty 5

## 2020-05-30 NOTE — Assessment & Plan Note (Addendum)
#  Relapsed diffuse large B-cell lymphoma-ABC subtype; IV [spleen];  PET scan may 19th,2021-left neck new lymphadenopathy SUV 5/upper 12 mm; s/p excisional biopsy-positive for diffuse large B-cell lymphoma. On Gemcitabine-carboplatin-day 1; gemcitabine day 8; every 21 days.  Dexamethasone 40 mg daily day 1 through day 4. STABLE.   # Proceed with cycle #2 today;d-1; .Labs today reviewed;  acceptable for treatment today.  Start dex 40 mg/day x 4 days.  Plan PET scan after second cycle.  # Bil acute PE- [nov 2020]-currently on xarelto 10 mg/day/cardiology-STABLE.   # Borderline LOW EF of 45-50%; Sep Echo 50-55% [Dr.Gollan]- on coreg BID-STABLE.   # DISPOSITION:  # chemo today # 1 week MD; labs-cbc/cmp/ldh; Elissa Hefty # in week of 23rd;  MD; labs- cbc/cmp; NO chemo; PET prior--Dr.B

## 2020-05-30 NOTE — Progress Notes (Signed)
[facility] NOTE  Patient Care Team: [name], MD as PCP - General (Family Medicine) [name], [name], MD (General Surgery) Cammie Sickle, MD as Consulting Physician (Internal Medicine)  CHIEF COMPLAINTS/PURPOSE OF CONSULTATION: Diffuse large B-cell lymphoma  #  Oncology History Overview Note  #June 29, 2019-CT- bulky bilateral neck adenopathy left more than right; September 2020-Diffuse B-cell lymphoma; ABC subtype; NEGATIVE FISH studies.  PET scan -bulky adenopathy involving neck /lymphadenopathy mediastinum /abdomen/pelvis inguinal lymph nodes; splenic involvement.  Question bone.  Stage IV [spleen involvement; no bone marrow biopsy].  BCL-2: Positive /BCL 6: Positive /MUM-1: Positive /CD10: Negative 'Ki-67: High proliferation index (90%) /CD30: Negative /Cyclin D1: Negative C-MYC: Equivocal (20% staining seen on limited and fragmented tissue) /EBER: Negative   # Ritux-Pred- 9/11; 9/18- R-COP [held adria- sec to EF 45-50%]  #May 2021-PET scan left neck recurrent disease; excisional biopsy [Dr. Vaught]-recurrent diffuse left B-cell lymphoma  #November 2020-bilateral PE; on xarelto  #J07/07/2020- 2021- Gem-CarboDex [II opinion [facility]; Dr. Thera Flake; stem cell transplant eval]; no Rituxan-repeat CD20 negative.     # Sep 2020- Borderline low EF/Left ventricular hypokinesis [Dr.Gollan]-s October 9th stress test-nonischemic;   # DIAGNOSIS: DLBCL  STAGE:   4      ;GOALS: Cure  CURRENT/MOST RECENT THERAPY : R-COP/-R-CEOP. [C]    Lymphoma of lymph nodes ([place])  07/03/2019 Initial Diagnosis   Lymphoma of lymph nodes ([place])   07/13/2019 - 12/07/2019 Chemotherapy   The patient had palonosetron (ALOXI) injection 0.25 mg, 0.25 mg, Intravenous,  Once, 7 of 7 cycles Administration: 0.25 mg (07/20/2019), 0.25 mg (08/13/2019), 0.25 mg (11/13/2019), 0.25 mg (09/03/2019), 0.25 mg (10/02/2019), 0.25 mg (10/23/2019), 0.25 mg (12/04/2019) pegfilgrastim (NEULASTA ONPRO KIT)  injection 6 mg, 6 mg, Subcutaneous, Once, 2 of 2 cycles Administration: 6 mg (10/04/2019), 6 mg (10/25/2019) pegfilgrastim-cbqv (UDENYCA) injection 6 mg, 6 mg, Subcutaneous, Once, 5 of 5 cycles Administration: 6 mg (07/23/2019), 6 mg (08/16/2019), 6 mg (09/06/2019), 6 mg (11/16/2019), 6 mg (12/07/2019) vinCRIStine (ONCOVIN) 2 mg in sodium chloride 0.9 % 50 mL chemo infusion, 2 mg, Intravenous,  Once, 7 of 7 cycles Administration: 2 mg (07/20/2019), 2 mg (08/13/2019), 2 mg (11/13/2019), 2 mg (09/03/2019), 2 mg (10/02/2019), 2 mg (10/23/2019), 2 mg (12/04/2019) cyclophosphamide (CYTOXAN) 1,900 mg in sodium chloride 0.9 % 250 mL chemo infusion, 750 mg/m2 = 1,900 mg, Intravenous,  Once, 7 of 7 cycles Administration: 1,900 mg (07/20/2019), 1,900 mg (08/13/2019), 2,000 mg (11/13/2019), 1,900 mg (09/03/2019), 2,000 mg (10/02/2019), 2,000 mg (10/23/2019), 2,000 mg (12/04/2019) etoposide (VEPESID) 250 mg in sodium chloride 0.9 % 1,000 mL chemo infusion, 100 mg/m2 = 250 mg, Intravenous,  Once, 6 of 6 cycles Dose modification: 50 mg/m2 (original dose 100 mg/m2, Cycle 3, Reason: Provider Judgment) Administration: 130 mg (08/14/2019), 130 mg (08/15/2019), 130 mg (09/03/2019), 130 mg (09/04/2019), 130 mg (09/05/2019), 130 mg (10/03/2019), 130 mg (10/04/2019), 130 mg (10/24/2019), 130 mg (10/25/2019), 130 mg (11/13/2019), 130 mg (11/14/2019), 130 mg (11/15/2019), 130 mg (10/02/2019), 130 mg (10/23/2019), 130 mg (12/04/2019), 130 mg (12/05/2019), 130 mg (12/06/2019) dexamethasone (DECADRON) 12 mg in sodium chloride 0.9 % 145 mL IVPB, , Intravenous,  Once, 8 of 8 cycles Administration:  (07/13/2019),  (07/20/2019),  (08/13/2019),  (11/13/2019),  (09/03/2019),  (10/02/2019),  (10/23/2019),  (12/04/2019) riTUXimab-pvvr (RUXIENCE) 900 mg in sodium chloride 0.9 % 250 mL (2.6471 mg/mL) infusion, 375 mg/m2 = 900 mg (100 % of original dose 375 mg/m2), Intravenous,  Once, 1 of 1 cycle Dose modification: 375 mg/m2 (original dose 375  mg/m2, Cycle 1) Administration:  900 mg (07/13/2019)  for chemotherapy treatment.    05/09/2020 -  Chemotherapy   The patient had palonosetron (ALOXI) injection 0.25 mg, 0.25 mg, Intravenous,  Once, 2 of 4 cycles Administration: 0.25 mg (05/09/2020), 0.25 mg (05/30/2020) CARBOplatin (PARAPLATIN) 750 mg in sodium chloride 0.9 % 250 mL chemo infusion, 750 mg (100 % of original dose 750 mg), Intravenous,  Once, 2 of 4 cycles Dose modification:   (original dose 750 mg, Cycle 1, Reason: Provider Judgment, Comment: labs from care every where. ) Administration: 750 mg (05/09/2020), 750 mg (05/30/2020) gemcitabine (GEMZAR) 2,500 mg in sodium chloride 0.9 % 250 mL chemo infusion, 2,584 mg, Intravenous,  Once, 2 of 4 cycles Administration: 2,500 mg (05/09/2020), 2,500 mg (05/16/2020), 2,500 mg (05/30/2020) fosaprepitant (EMEND) 150 mg in sodium chloride 0.9 % 145 mL IVPB, 150 mg, Intravenous,  Once, 2 of 4 cycles Administration: 150 mg (05/09/2020), 150 mg (05/30/2020)  for chemotherapy treatment.      HISTORY OF PRESENTING ILLNESS:  Eric [name] 69 y.o.  male relapsed diffuse large B-cell lymphoma currently on GDP chemotherapy is here for follow-up.  Patient tolerated cycle #1 very well.  Denies any worsening of new lumps or bumps.  Denies any worsening tingling or numbness.  Review of Systems  Constitutional: Negative for chills, diaphoresis and fever.  HENT: Positive for hearing loss. Negative for nosebleeds and sore throat.   Eyes: Negative for double vision.  Respiratory: Negative for hemoptysis, sputum production and wheezing.   Cardiovascular: Negative for chest pain, palpitations, orthopnea and leg swelling.  Gastrointestinal: Negative for abdominal pain, blood in stool, constipation, diarrhea, heartburn, melena, nausea and vomiting.  Genitourinary: Negative for dysuria, frequency and urgency.  Musculoskeletal: Negative for back pain and joint pain.  Skin: Negative.  Negative for itching and rash.  Neurological: Negative for  dizziness, tingling, focal weakness, weakness and headaches.  Endo/Heme/Allergies: Does not bruise/bleed easily.  Psychiatric/Behavioral: Negative for depression. The patient does not have insomnia.      MEDICAL HISTORY:  Past Medical History:  Diagnosis Date  . Adopted person   . Colon polyps   . Pancreatitis   . Rectal bleeding 02/24/14  . Sepsis ([place]) 09/18/2019    SURGICAL HISTORY: Past Surgical History:  Procedure Laterality Date  . CHOLECYSTECTOMY    . COLONOSCOPY  02/28/14  . IR IMAGING GUIDED PORT INSERTION  07/18/2019  . LYMPH NODE BIOPSY Left 03/28/2020   Procedure: EXCISION OF SUBMANDIBULAR  LYMPH NODE;  Surgeon: Carloyn Manner, MD;  Location: [facility];  Service: ENT;  Laterality: Left;  . POLYPECTOMY     colon polyp removed  . TONSILLECTOMY      SOCIAL HISTORY: Social History   Socioeconomic History  . Marital status: Married    Spouse name: Not on file  . Number of children: Not on file  . Years of education: Not on file  . Highest education level: Not on file  Occupational History    Comment: Retired from [employer]  . Smoking status: Current Every Day Smoker    Packs/day: 1.00    Years: 20.00    Pack years: 20.00    Types: Cigars  . Smokeless tobacco: Never Used  . Tobacco comment: smoke 1/2 ppd cigaretes since 69 yo. changed to cigars at age July 2004.   Vaping Use  . Vaping Use: Never used  Substance and Sexual Activity  . Alcohol use: Yes    Comment: 6-7 drinks weekly  .  Drug use: No  . Sexual activity: Not on file  Other Topics Concern  . Not on file  Social History Narrative   Lives at home with wife; in [place]. retd from [employer] smoker; drinks liquor. Children - grown up.    Social Determinants of Health   Financial Resource Strain:   . Difficulty of Paying Living Expenses:   Food Insecurity:   . Worried About Charity fundraiser in the Last Year:   . Arboriculturist in the Last Year:    Transportation Needs:   . Film/video editor (Medical):   Marland Kitchen Lack of Transportation (Non-Medical):   Physical Activity:   . Days of Exercise per Week:   . Minutes of Exercise per Session:   Stress:   . Feeling of Stress :   Social Connections:   . Frequency of Communication with Friends and Family:   . Frequency of Social Gatherings with Friends and Family:   . Attends Religious Services:   . Active Member of Clubs or Organizations:   . Attends Archivist Meetings:   Marland Kitchen Marital Status:   Intimate Partner Violence:   . Fear of Current or Ex-Partner:   . Emotionally Abused:   Marland Kitchen Physically Abused:   . Sexually Abused:     FAMILY HISTORY: Family History  Adopted: Yes    ALLERGIES:  is allergic to allopurinol, azithromycin, and losartan potassium.  MEDICATIONS:  Current Outpatient Medications  Medication Sig Dispense Refill  . carvedilol (COREG) 6.25 MG tablet Take 1 tablet (6.25 mg total) by mouth 2 (two) times daily. 180 tablet 3  . dexamethasone (DECADRON) 4 MG tablet Take 10 tablets; once a day x 4 days- Start on the day of chemo 40 tablet 0  . lidocaine-prilocaine (EMLA) cream Apply generously on the port 30-45 prior to accessing the port. 30 g 1  . Multiple Vitamins-Minerals (MULTI-VITAMIN/MINERALS PO) Take 1 capsule by mouth.    . rivaroxaban (XARELTO) 10 MG TABS tablet Take 1 tablet (10 mg total) by mouth daily. 90 tablet 3  . UNABLE TO FIND Med Name: CBD oil    . ondansetron (ZOFRAN) 4 MG tablet Take 1 tablet (4 mg total) by mouth every 8 (eight) hours as needed for up to 10 doses for nausea or vomiting. (Patient not taking: Reported on 05/09/2020) 20 tablet 0   No current facility-administered medications for this visit.      Marland Kitchen  PHYSICAL EXAMINATION: ECOG PERFORMANCE STATUS: 1 - Symptomatic but completely ambulatory  Vitals:   05/30/20 0842  BP: (!) 130/64  Pulse: 73  Resp: 16  Temp: (!) 97.4 F (36.3 C)  SpO2: 99%   Filed Weights   05/30/20  0842  Weight: (!) 292 lb (132.5 kg)    Physical Exam HENT:     Head: Normocephalic and atraumatic.     Mouth/Throat:     Pharynx: No oropharyngeal exudate.  Eyes:     Pupils: Pupils are equal, round, and reactive to light.  Neck:     Comments: Approximately 2 cm lymph node felt in the left submandibular region Cardiovascular:     Rate and Rhythm: Normal rate and regular rhythm.  Pulmonary:     Effort: Pulmonary effort is normal. No respiratory distress.     Breath sounds: Normal breath sounds. No wheezing.  Abdominal:     General: Bowel sounds are normal. There is no distension.     Palpations: Abdomen is soft. There is no mass.  Tenderness: There is no abdominal tenderness. There is no guarding or rebound.  Musculoskeletal:        General: No tenderness. Normal range of motion.     Cervical back: Normal range of motion and neck supple.  Skin:    General: Skin is warm.  Neurological:     Mental Status: He is alert and oriented to person, place, and time.  Psychiatric:        Mood and Affect: Affect normal.    LABORATORY DATA:  I have reviewed the data as listed Lab Results  Component Value Date   WBC 3.6 (L) 05/30/2020   HGB 12.6 (L) 05/30/2020   HCT 36.1 (L) 05/30/2020   MCV 90.0 05/30/2020   PLT 284 05/30/2020   Recent Labs    05/09/20 0942 05/16/20 0830 05/30/20 0830  NA 142 138 138  K 4.1 4.0 3.8  CL 104 102 103  CO2 _0 GLUCOSE 107* 103* 136*  BUN _1 CREATININE 0.95 0.78 0.86  CALCIUM 8.9 8.6* 8.8*  GFRNONAA >60 >60 >60  GFRAA >60 >60 >60  PROT 6.7 6.3* 6.3*  ALBUMIN 4.1 3.8 4.0  AST _2 ALT 25 36 28  ALKPHOS 74 77 76  BILITOT 1.2 0.9 1.0    RADIOGRAPHIC STUDIES: I have personally reviewed the radiological images as listed and agreed with the findings in the report. No results found.  ASSESSMENT & PLAN:   Lymphoma of lymph nodes ([place]) #Relapsed diffuse large B-cell lymphoma-ABC subtype; IV [spleen];  PET scan may  19th,2021-left neck new lymphadenopathy SUV 5/upper 12 mm; s/p excisional biopsy-positive for diffuse large B-cell lymphoma. On Gemcitabine-carboplatin-day 1; gemcitabine day 8; every 21 days.  Dexamethasone 40 mg daily day 1 through day 4. STABLE.   # Proceed with cycle #2 today;d-1; .Labs today reviewed;  acceptable for treatment today.  Start dex 40 mg/day x 4 days.  Plan PET scan after second cycle.  # Bil acute PE- [nov 2020]-currently on xarelto 10 mg/day/cardiology-STABLE.   # Borderline LOW EF of 45-50%; Sep Echo 50-55% [Dr.Gollan]- on coreg BID-STABLE.   # DISPOSITION:  # chemo today # 1 week MD; labs-cbc/cmp/ldh; Elissa Hefty # in week of 23rd;  MD; labs- cbc/cmp; NO chemo; PET prior--Dr.B    All questions were answered. The patient knows to call the clinic with any problems, questions or concerns.    Cammie Sickle, MD 06/01/2020 9:23 PM

## 2020-06-06 ENCOUNTER — Inpatient Hospital Stay (HOSPITAL_BASED_OUTPATIENT_CLINIC_OR_DEPARTMENT_OTHER): Payer: Medicare Other | Admitting: Internal Medicine

## 2020-06-06 ENCOUNTER — Other Ambulatory Visit: Payer: Self-pay

## 2020-06-06 ENCOUNTER — Encounter: Payer: Self-pay | Admitting: Internal Medicine

## 2020-06-06 ENCOUNTER — Inpatient Hospital Stay: Payer: Medicare Other

## 2020-06-06 ENCOUNTER — Inpatient Hospital Stay: Payer: Medicare Other | Attending: Internal Medicine

## 2020-06-06 DIAGNOSIS — C8590 Non-Hodgkin lymphoma, unspecified, unspecified site: Secondary | ICD-10-CM

## 2020-06-06 DIAGNOSIS — Z7901 Long term (current) use of anticoagulants: Secondary | ICD-10-CM | POA: Diagnosis not present

## 2020-06-06 DIAGNOSIS — Z86711 Personal history of pulmonary embolism: Secondary | ICD-10-CM | POA: Diagnosis not present

## 2020-06-06 DIAGNOSIS — Z5111 Encounter for antineoplastic chemotherapy: Secondary | ICD-10-CM | POA: Diagnosis not present

## 2020-06-06 DIAGNOSIS — C8331 Diffuse large B-cell lymphoma, lymph nodes of head, face, and neck: Secondary | ICD-10-CM | POA: Insufficient documentation

## 2020-06-06 LAB — CBC WITH DIFFERENTIAL/PLATELET
Abs Immature Granulocytes: 0.51 10*3/uL — ABNORMAL HIGH (ref 0.00–0.07)
Basophils Absolute: 0.1 10*3/uL (ref 0.0–0.1)
Basophils Relative: 1 %
Eosinophils Absolute: 0.1 10*3/uL (ref 0.0–0.5)
Eosinophils Relative: 1 %
HCT: 37.6 % — ABNORMAL LOW (ref 39.0–52.0)
Hemoglobin: 13.2 g/dL (ref 13.0–17.0)
Immature Granulocytes: 9 %
Lymphocytes Relative: 22 %
Lymphs Abs: 1.2 10*3/uL (ref 0.7–4.0)
MCH: 31.1 pg (ref 26.0–34.0)
MCHC: 35.1 g/dL (ref 30.0–36.0)
MCV: 88.5 fL (ref 80.0–100.0)
Monocytes Absolute: 0.3 10*3/uL (ref 0.1–1.0)
Monocytes Relative: 6 %
Neutro Abs: 3.3 10*3/uL (ref 1.7–7.7)
Neutrophils Relative %: 61 %
Platelets: 240 10*3/uL (ref 150–400)
RBC: 4.25 MIL/uL (ref 4.22–5.81)
RDW: 15.1 % (ref 11.5–15.5)
Smear Review: NORMAL
WBC: 5.5 10*3/uL (ref 4.0–10.5)
nRBC: 0 % (ref 0.0–0.2)

## 2020-06-06 LAB — COMPREHENSIVE METABOLIC PANEL
ALT: 60 U/L — ABNORMAL HIGH (ref 0–44)
AST: 26 U/L (ref 15–41)
Albumin: 4 g/dL (ref 3.5–5.0)
Alkaline Phosphatase: 80 U/L (ref 38–126)
Anion gap: 10 (ref 5–15)
BUN: 23 mg/dL (ref 8–23)
CO2: 28 mmol/L (ref 22–32)
Calcium: 8.9 mg/dL (ref 8.9–10.3)
Chloride: 101 mmol/L (ref 98–111)
Creatinine, Ser: 0.8 mg/dL (ref 0.61–1.24)
GFR calc Af Amer: >60 mL/min (ref >60)
GFR calc non Af Amer: >60 mL/min (ref >60)
Glucose, Bld: 131 mg/dL — ABNORMAL HIGH (ref 70–99)
Potassium: 4 mmol/L (ref 3.5–5.1)
Sodium: 139 mmol/L (ref 135–145)
Total Bilirubin: 1.1 mg/dL (ref 0.3–1.2)
Total Protein: 6.5 g/dL (ref 6.5–8.1)

## 2020-06-06 LAB — LACTATE DEHYDROGENASE: LDH: 216 U/L — ABNORMAL HIGH (ref 98–192)

## 2020-06-06 MED ORDER — SODIUM CHLORIDE 0.9 % IV SOLN
2500.0000 mg | Freq: Once | INTRAVENOUS | Status: AC
  Administered 2020-06-06: 2500 mg via INTRAVENOUS
  Filled 2020-06-06: qty 52.6

## 2020-06-06 MED ORDER — HEPARIN SOD (PORK) LOCK FLUSH 100 UNIT/ML IV SOLN
INTRAVENOUS | Status: AC
  Filled 2020-06-06: qty 5

## 2020-06-06 MED ORDER — PROCHLORPERAZINE MALEATE 10 MG PO TABS
10.0000 mg | ORAL_TABLET | Freq: Once | ORAL | Status: AC
  Administered 2020-06-06: 10 mg via ORAL
  Filled 2020-06-06: qty 1

## 2020-06-06 MED ORDER — SODIUM CHLORIDE 0.9% FLUSH
10.0000 mL | INTRAVENOUS | Status: DC | PRN
  Administered 2020-06-06: 10 mL via INTRAVENOUS
  Filled 2020-06-06: qty 10

## 2020-06-06 MED ORDER — HEPARIN SOD (PORK) LOCK FLUSH 100 UNIT/ML IV SOLN
500.0000 [IU] | Freq: Once | INTRAVENOUS | Status: AC
  Administered 2020-06-06: 500 [IU] via INTRAVENOUS
  Filled 2020-06-06: qty 5

## 2020-06-06 MED ORDER — SODIUM CHLORIDE 0.9 % IV SOLN
Freq: Once | INTRAVENOUS | Status: AC
  Filled 2020-06-06: qty 250

## 2020-06-06 MED ORDER — HEPARIN SOD (PORK) LOCK FLUSH 100 UNIT/ML IV SOLN
500.0000 [IU] | Freq: Once | INTRAVENOUS | Status: DC | PRN
  Filled 2020-06-06: qty 5

## 2020-06-06 NOTE — Progress Notes (Signed)
[place] NOTE  Patient Care Team: [name], MD as PCP - General (Family Medicine) [name], [name], MD (General Surgery) Cammie Sickle, MD as Consulting Physician (Internal Medicine)  CHIEF COMPLAINTS/PURPOSE OF CONSULTATION: Diffuse large B-cell lymphoma  #  Oncology History Overview Note  #June 29, 2019-CT- bulky bilateral neck adenopathy left more than right; September 2020-Diffuse B-cell lymphoma; ABC subtype; NEGATIVE FISH studies.  PET scan -bulky adenopathy involving neck /lymphadenopathy mediastinum /abdomen/pelvis inguinal lymph nodes; splenic involvement.  Question bone.  Stage IV [spleen involvement; no bone marrow biopsy].  BCL-2: Positive /BCL 6: Positive /MUM-1: Positive /CD10: Negative 'Ki-67: High proliferation index (90%) /CD30: Negative /Cyclin D1: Negative C-MYC: Equivocal (20% staining seen on limited and fragmented tissue) /EBER: Negative   # Ritux-Pred- 9/11; 9/18- R-COP [held adria- sec to EF 45-50%]  #May 2021-PET scan left neck recurrent disease; excisional biopsy [Dr. Vaught]-recurrent diffuse left B-cell lymphoma  #November 2020-bilateral PE; on xarelto  #J07/07/2020- 2021- Gem-CarboDex [II opinion [facility]; Dr. Thera Flake; stem cell transplant eval]; no Rituxan-repeat CD20 negative.     # Sep 2020- Borderline low EF/Left ventricular hypokinesis [Dr.Gollan]-s October 9th stress test-nonischemic;   # DIAGNOSIS: DLBCL  STAGE:   4      ;GOALS: Cure  CURRENT/MOST RECENT THERAPY : R-COP/-R-CEOP. [C]    Lymphoma of lymph nodes ([place])  07/03/2019 Initial Diagnosis   Lymphoma of lymph nodes ([place])   07/13/2019 - 12/07/2019 Chemotherapy   The patient had palonosetron (ALOXI) injection 0.25 mg, 0.25 mg, Intravenous,  Once, 7 of 7 cycles Administration: 0.25 mg (07/20/2019), 0.25 mg (08/13/2019), 0.25 mg (11/13/2019), 0.25 mg (09/03/2019), 0.25 mg (10/02/2019), 0.25 mg (10/23/2019), 0.25 mg (12/04/2019) pegfilgrastim (NEULASTA ONPRO KIT)  injection 6 mg, 6 mg, Subcutaneous, Once, 2 of 2 cycles Administration: 6 mg (10/04/2019), 6 mg (10/25/2019) pegfilgrastim-cbqv (UDENYCA) injection 6 mg, 6 mg, Subcutaneous, Once, 5 of 5 cycles Administration: 6 mg (07/23/2019), 6 mg (08/16/2019), 6 mg (09/06/2019), 6 mg (11/16/2019), 6 mg (12/07/2019) vinCRIStine (ONCOVIN) 2 mg in sodium chloride 0.9 % 50 mL chemo infusion, 2 mg, Intravenous,  Once, 7 of 7 cycles Administration: 2 mg (07/20/2019), 2 mg (08/13/2019), 2 mg (11/13/2019), 2 mg (09/03/2019), 2 mg (10/02/2019), 2 mg (10/23/2019), 2 mg (12/04/2019) cyclophosphamide (CYTOXAN) 1,900 mg in sodium chloride 0.9 % 250 mL chemo infusion, 750 mg/m2 = 1,900 mg, Intravenous,  Once, 7 of 7 cycles Administration: 1,900 mg (07/20/2019), 1,900 mg (08/13/2019), 2,000 mg (11/13/2019), 1,900 mg (09/03/2019), 2,000 mg (10/02/2019), 2,000 mg (10/23/2019), 2,000 mg (12/04/2019) etoposide (VEPESID) 250 mg in sodium chloride 0.9 % 1,000 mL chemo infusion, 100 mg/m2 = 250 mg, Intravenous,  Once, 6 of 6 cycles Dose modification: 50 mg/m2 (original dose 100 mg/m2, Cycle 3, Reason: Provider Judgment) Administration: 130 mg (08/14/2019), 130 mg (08/15/2019), 130 mg (09/03/2019), 130 mg (09/04/2019), 130 mg (09/05/2019), 130 mg (10/03/2019), 130 mg (10/04/2019), 130 mg (10/24/2019), 130 mg (10/25/2019), 130 mg (11/13/2019), 130 mg (11/14/2019), 130 mg (11/15/2019), 130 mg (10/02/2019), 130 mg (10/23/2019), 130 mg (12/04/2019), 130 mg (12/05/2019), 130 mg (12/06/2019) dexamethasone (DECADRON) 12 mg in sodium chloride 0.9 % 145 mL IVPB, , Intravenous,  Once, 8 of 8 cycles Administration:  (07/13/2019),  (07/20/2019),  (08/13/2019),  (11/13/2019),  (09/03/2019),  (10/02/2019),  (10/23/2019),  (12/04/2019) riTUXimab-pvvr (RUXIENCE) 900 mg in sodium chloride 0.9 % 250 mL (2.6471 mg/mL) infusion, 375 mg/m2 = 900 mg (100 % of original dose 375 mg/m2), Intravenous,  Once, 1 of 1 cycle Dose modification: 375 mg/m2 (original dose 375  mg/m2, Cycle 1) Administration:  900 mg (07/13/2019)  for chemotherapy treatment.    05/09/2020 -  Chemotherapy   The patient had palonosetron (ALOXI) injection 0.25 mg, 0.25 mg, Intravenous,  Once, 2 of 4 cycles Administration: 0.25 mg (05/09/2020), 0.25 mg (05/30/2020) CARBOplatin (PARAPLATIN) 750 mg in sodium chloride 0.9 % 250 mL chemo infusion, 750 mg (100 % of original dose 750 mg), Intravenous,  Once, 2 of 4 cycles Dose modification:   (original dose 750 mg, Cycle 1, Reason: Provider Judgment, Comment: labs from care every where. ) Administration: 750 mg (05/09/2020), 750 mg (05/30/2020) gemcitabine (GEMZAR) 2,500 mg in sodium chloride 0.9 % 250 mL chemo infusion, 2,584 mg, Intravenous,  Once, 2 of 4 cycles Administration: 2,500 mg (05/09/2020), 2,500 mg (05/16/2020), 2,500 mg (05/30/2020) fosaprepitant (EMEND) 150 mg in sodium chloride 0.9 % 145 mL IVPB, 150 mg, Intravenous,  Once, 2 of 4 cycles Administration: 150 mg (05/09/2020), 150 mg (05/30/2020)  for chemotherapy treatment.      HISTORY OF PRESENTING ILLNESS:  Eric [name] 69 y.o.  male relapsed diffuse large B-cell lymphoma currently on GDP chemotherapy is here for follow-up.  Patient tolerated cycle #2; day-8 very well.   Denies any worsening of new lumps or bumps. Denies any worsening tingling or numbness. No chills or fevers.   Review of Systems  Constitutional: Negative for chills, diaphoresis and fever.  HENT: Positive for hearing loss. Negative for nosebleeds and sore throat.   Eyes: Negative for double vision.  Respiratory: Negative for hemoptysis, sputum production and wheezing.   Cardiovascular: Negative for chest pain, palpitations, orthopnea and leg swelling.  Gastrointestinal: Negative for abdominal pain, blood in stool, constipation, diarrhea, heartburn, melena, nausea and vomiting.  Genitourinary: Negative for dysuria, frequency and urgency.  Musculoskeletal: Negative for back pain and joint pain.  Skin: Negative.  Negative for itching and rash.   Neurological: Negative for dizziness, tingling, focal weakness, weakness and headaches.  Endo/Heme/Allergies: Does not bruise/bleed easily.  Psychiatric/Behavioral: Negative for depression. The patient does not have insomnia.      MEDICAL HISTORY:  Past Medical History:  Diagnosis Date  . Adopted person   . Colon polyps   . Pancreatitis   . Rectal bleeding 02/24/14  . Sepsis ([place]) 09/18/2019    SURGICAL HISTORY: Past Surgical History:  Procedure Laterality Date  . CHOLECYSTECTOMY    . COLONOSCOPY  02/28/14  . IR IMAGING GUIDED PORT INSERTION  07/18/2019  . LYMPH NODE BIOPSY Left 03/28/2020   Procedure: EXCISION OF SUBMANDIBULAR  LYMPH NODE;  Surgeon: Carloyn Manner, MD;  Location: [place];  Service: ENT;  Laterality: Left;  . POLYPECTOMY     colon polyp removed  . TONSILLECTOMY      SOCIAL HISTORY: Social History   Socioeconomic History  . Marital status: Married    Spouse name: Not on file  . Number of children: Not on file  . Years of education: Not on file  . Highest education level: Not on file  Occupational History    Comment: Retired from [employer]  . Smoking status: Current Every Day Smoker    Packs/day: 1.00    Years: 20.00    Pack years: 20.00    Types: Cigars  . Smokeless tobacco: Never Used  . Tobacco comment: smoke 1/2 ppd cigaretes since 69 yo. changed to cigars at age July 2004.   Vaping Use  . Vaping Use: Never used  Substance and Sexual Activity  . Alcohol use: Yes  Comment: 6-7 drinks weekly  . Drug use: No  . Sexual activity: Not on file  Other Topics Concern  . Not on file  Social History Narrative   Lives at home with wife; in [place]. retd from [employer] smoker; drinks liquor. Children - grown up.    Social Determinants of Health   Financial Resource Strain:   . Difficulty of Paying Living Expenses:   Food Insecurity:   . Worried About Charity fundraiser in the Last Year:   . Arboriculturist in  the Last Year:   Transportation Needs:   . Film/video editor (Medical):   Marland Kitchen Lack of Transportation (Non-Medical):   Physical Activity:   . Days of Exercise per Week:   . Minutes of Exercise per Session:   Stress:   . Feeling of Stress :   Social Connections:   . Frequency of Communication with Friends and Family:   . Frequency of Social Gatherings with Friends and Family:   . Attends Religious Services:   . Active Member of Clubs or Organizations:   . Attends Archivist Meetings:   Marland Kitchen Marital Status:   Intimate Partner Violence:   . Fear of Current or Ex-Partner:   . Emotionally Abused:   Marland Kitchen Physically Abused:   . Sexually Abused:     FAMILY HISTORY: Family History  Adopted: Yes    ALLERGIES:  is allergic to allopurinol, azithromycin, and losartan potassium.  MEDICATIONS:  Current Outpatient Medications  Medication Sig Dispense Refill  . carvedilol (COREG) 6.25 MG tablet Take 1 tablet (6.25 mg total) by mouth 2 (two) times daily. 180 tablet 3  . dexamethasone (DECADRON) 4 MG tablet Take 10 tablets; once a day x 4 days- Start on the day of chemo 40 tablet 0  . lidocaine-prilocaine (EMLA) cream Apply generously on the port 30-45 prior to accessing the port. 30 g 1  . Multiple Vitamins-Minerals (MULTI-VITAMIN/MINERALS PO) Take 1 capsule by mouth.    . rivaroxaban (XARELTO) 10 MG TABS tablet Take 1 tablet (10 mg total) by mouth daily. 90 tablet 3  . UNABLE TO FIND Med Name: CBD oil    . ondansetron (ZOFRAN) 4 MG tablet Take 1 tablet (4 mg total) by mouth every 8 (eight) hours as needed for up to 10 doses for nausea or vomiting. (Patient not taking: Reported on 05/09/2020) 20 tablet 0   No current facility-administered medications for this visit.   Facility-Administered Medications Ordered in Other Visits  Medication Dose Route Frequency Provider Last Rate Last Admin  . 0.9 %  sodium chloride infusion   Intravenous Once Charlaine Dalton R, MD      . gemcitabine  (GEMZAR) 2,584 mg in sodium chloride 0.9 % 250 mL chemo infusion  1,000 mg/m2 (Treatment Plan Recorded) Intravenous Once Charlaine Dalton R, MD      . heparin lock flush 100 unit/mL  500 Units Intravenous Once Charlaine Dalton R, MD      . heparin lock flush 100 unit/mL  500 Units Intracatheter Once PRN Cammie Sickle, MD      . prochlorperazine (COMPAZINE) tablet 10 mg  10 mg Oral Once Charlaine Dalton R, MD      . sodium chloride flush (NS) 0.9 % injection 10 mL  10 mL Intravenous PRN Cammie Sickle, MD   10 mL at 06/06/20 0910      .  PHYSICAL EXAMINATION: ECOG PERFORMANCE STATUS: 1 - Symptomatic but completely ambulatory  Vitals:  06/06/20 0926  BP: 123/71  Pulse: 68  Resp: 18  Temp: (!) 97.5 F (36.4 C)  SpO2: 99%   Filed Weights   06/06/20 0926  Weight: 290 lb 12.8 oz (131.9 kg)    Physical Exam HENT:     Head: Normocephalic and atraumatic.     Mouth/Throat:     Pharynx: No oropharyngeal exudate.  Eyes:     Pupils: Pupils are equal, round, and reactive to light.  Neck:     Comments: Approximately 2 cm lymph node felt in the left submandibular region Cardiovascular:     Rate and Rhythm: Normal rate and regular rhythm.  Pulmonary:     Effort: Pulmonary effort is normal. No respiratory distress.     Breath sounds: Normal breath sounds. No wheezing.  Abdominal:     General: Bowel sounds are normal. There is no distension.     Palpations: Abdomen is soft. There is no mass.     Tenderness: There is no abdominal tenderness. There is no guarding or rebound.  Musculoskeletal:        General: No tenderness. Normal range of motion.     Cervical back: Normal range of motion and neck supple.  Skin:    General: Skin is warm.  Neurological:     Mental Status: He is alert and oriented to person, place, and time.  Psychiatric:        Mood and Affect: Affect normal.    LABORATORY DATA:  I have reviewed the data as listed Lab Results  Component  Value Date   WBC 5.5 06/06/2020   HGB 13.2 06/06/2020   HCT 37.6 (L) 06/06/2020   MCV 88.5 06/06/2020   PLT 240 06/06/2020   Recent Labs    05/16/20 0830 05/30/20 0830 06/06/20 0859  NA 138 138 139  K 4.0 3.8 4.0  CL 102 103 101  CO2 _0 GLUCOSE 103* 136* 131*  BUN _1 CREATININE 0.78 0.86 0.80  CALCIUM 8.6* 8.8* 8.9  GFRNONAA >60 >60 >60  GFRAA >60 >60 >60  PROT 6.3* 6.3* 6.5  ALBUMIN 3.8 4.0 4.0  AST _2 ALT 36 28 60*  ALKPHOS 77 76 80  BILITOT 0.9 1.0 1.1    RADIOGRAPHIC STUDIES: I have personally reviewed the radiological images as listed and agreed with the findings in the report. No results found.  ASSESSMENT & PLAN:   Lymphoma of lymph nodes ([place]) #Relapsed diffuse large B-cell lymphoma-ABC subtype; IV [spleen];  PET scan may 19th,2021-left neck new lymphadenopathy SUV 5/upper 12 mm; s/p excisional biopsy-positive for diffuse large B-cell lymphoma. On Gemcitabine-carboplatin-day 1; gemcitabine day 8; every 21 days.  Dexamethasone 40 mg daily day 1 through day 4. STABLE.   # Proceed with cycle #2 today;d-15; .Labs today reviewed;  acceptable for treatment today. Labs today reviewed;  acceptable for treatment today.   # Bil acute PE- [nov 2020]-currently on xarelto 10 mg/day/cardiology-STABLE.   # Borderline LOW EF of 45-50%; Sep Echo 50-55% [Dr.Gollan]- on coreg BID-STABLE.   # DISPOSITION:  # chemo today # follow up as planned [ week of 23rd;  MD; labs- cbc/cmp; NO chemo; PET prior]-Dr.B    All questions were answered. The patient knows to call the clinic with any problems, questions or concerns.    Cammie Sickle, MD 06/06/2020 9:54 AM

## 2020-06-06 NOTE — Assessment & Plan Note (Addendum)
#  Relapsed diffuse large B-cell lymphoma-ABC subtype; IV [spleen];  PET scan may 19th,2021-left neck new lymphadenopathy SUV 5/upper 12 mm; s/p excisional biopsy-positive for diffuse large B-cell lymphoma. On Gemcitabine-carboplatin-day 1; gemcitabine day 8; every 21 days.  Dexamethasone 40 mg daily day 1 through day 4. STABLE.   # Proceed with cycle #2 today;d-15; .Labs today reviewed;  acceptable for treatment today. Labs today reviewed;  acceptable for treatment today.   # Bil acute PE- [nov 2020]-currently on xarelto 10 mg/day/cardiology-STABLE.   # Borderline LOW EF of 45-50%; Sep Echo 50-55% [Dr.Gollan]- on coreg BID-STABLE.   # DISPOSITION:  # chemo today # follow up as planned [ week of 23rd;  MD; labs- cbc/cmp; NO chemo; PET prior]-Dr.B

## 2020-06-11 ENCOUNTER — Encounter: Payer: Self-pay | Admitting: Internal Medicine

## 2020-06-18 ENCOUNTER — Other Ambulatory Visit: Payer: Self-pay

## 2020-06-18 ENCOUNTER — Ambulatory Visit
Admission: RE | Admit: 2020-06-18 | Discharge: 2020-06-18 | Disposition: A | Discharge status: Home / Self Care | Payer: Medicare Other | Source: Ambulatory Visit | Attending: Internal Medicine | Admitting: Internal Medicine

## 2020-06-18 DIAGNOSIS — C8590 Non-Hodgkin lymphoma, unspecified, unspecified site: Secondary | ICD-10-CM | POA: Diagnosis not present

## 2020-06-18 DIAGNOSIS — N281 Cyst of kidney, acquired: Secondary | ICD-10-CM | POA: Diagnosis not present

## 2020-06-18 DIAGNOSIS — I2510 Atherosclerotic heart disease of native coronary artery without angina pectoris: Secondary | ICD-10-CM | POA: Insufficient documentation

## 2020-06-18 DIAGNOSIS — N200 Calculus of kidney: Secondary | ICD-10-CM | POA: Insufficient documentation

## 2020-06-18 DIAGNOSIS — I700 Atherosclerosis of aorta: Secondary | ICD-10-CM | POA: Diagnosis not present

## 2020-06-18 LAB — GLUCOSE, CAPILLARY: Glucose-Capillary: 94 mg/dL (ref 70–99)

## 2020-06-18 MED ORDER — FLUDEOXYGLUCOSE F - 18 (FDG) INJECTION
15.0000 | Freq: Once | INTRAVENOUS | Status: AC | PRN
  Administered 2020-06-18: 13.6 via INTRAVENOUS

## 2020-06-20 ENCOUNTER — Telehealth: Payer: Self-pay | Admitting: Internal Medicine

## 2020-06-20 NOTE — Telephone Encounter (Signed)
Patient regarding results of the PET scan-suboptimal results. recommend follow-up with me as planned on Monday 8/23. Also recommend reaching out to [facility], Dr. Thera Flake ASAP.

## 2020-06-23 ENCOUNTER — Inpatient Hospital Stay: Payer: Medicare Other

## 2020-06-23 ENCOUNTER — Inpatient Hospital Stay (HOSPITAL_BASED_OUTPATIENT_CLINIC_OR_DEPARTMENT_OTHER): Payer: Medicare Other | Admitting: Internal Medicine

## 2020-06-23 ENCOUNTER — Other Ambulatory Visit: Payer: Self-pay

## 2020-06-23 ENCOUNTER — Encounter: Payer: Self-pay | Admitting: Internal Medicine

## 2020-06-23 DIAGNOSIS — Z5111 Encounter for antineoplastic chemotherapy: Secondary | ICD-10-CM | POA: Diagnosis not present

## 2020-06-23 DIAGNOSIS — Z7901 Long term (current) use of anticoagulants: Secondary | ICD-10-CM | POA: Diagnosis not present

## 2020-06-23 DIAGNOSIS — Z86711 Personal history of pulmonary embolism: Secondary | ICD-10-CM | POA: Diagnosis not present

## 2020-06-23 DIAGNOSIS — C8590 Non-Hodgkin lymphoma, unspecified, unspecified site: Secondary | ICD-10-CM

## 2020-06-23 DIAGNOSIS — C8331 Diffuse large B-cell lymphoma, lymph nodes of head, face, and neck: Secondary | ICD-10-CM | POA: Diagnosis not present

## 2020-06-23 LAB — COMPREHENSIVE METABOLIC PANEL
ALT: 41 U/L (ref 0–44)
AST: 28 U/L (ref 15–41)
Albumin: 4.1 g/dL (ref 3.5–5.0)
Alkaline Phosphatase: 69 U/L (ref 38–126)
Anion gap: 8 (ref 5–15)
BUN: 13 mg/dL (ref 8–23)
CO2: 27 mmol/L (ref 22–32)
Calcium: 8.7 mg/dL — ABNORMAL LOW (ref 8.9–10.3)
Chloride: 105 mmol/L (ref 98–111)
Creatinine, Ser: 0.92 mg/dL (ref 0.61–1.24)
GFR calc Af Amer: >60 mL/min (ref >60)
GFR calc non Af Amer: >60 mL/min (ref >60)
Glucose, Bld: 119 mg/dL — ABNORMAL HIGH (ref 70–99)
Potassium: 3.8 mmol/L (ref 3.5–5.1)
Sodium: 140 mmol/L (ref 135–145)
Total Bilirubin: 0.8 mg/dL (ref 0.3–1.2)
Total Protein: 6.4 g/dL — ABNORMAL LOW (ref 6.5–8.1)

## 2020-06-23 LAB — CBC WITH DIFFERENTIAL/PLATELET
Abs Immature Granulocytes: 0.09 10*3/uL — ABNORMAL HIGH (ref 0.00–0.07)
Basophils Absolute: 0 10*3/uL (ref 0.0–0.1)
Basophils Relative: 1 %
Eosinophils Absolute: 0.1 10*3/uL (ref 0.0–0.5)
Eosinophils Relative: 3 %
HCT: 32.1 % — ABNORMAL LOW (ref 39.0–52.0)
Hemoglobin: 11 g/dL — ABNORMAL LOW (ref 13.0–17.0)
Immature Granulocytes: 3 %
Lymphocytes Relative: 25 %
Lymphs Abs: 0.8 10*3/uL (ref 0.7–4.0)
MCH: 31.9 pg (ref 26.0–34.0)
MCHC: 34.3 g/dL (ref 30.0–36.0)
MCV: 93 fL (ref 80.0–100.0)
Monocytes Absolute: 0.6 10*3/uL (ref 0.1–1.0)
Monocytes Relative: 19 %
Neutro Abs: 1.7 10*3/uL (ref 1.7–7.7)
Neutrophils Relative %: 49 %
Platelets: 318 10*3/uL (ref 150–400)
RBC: 3.45 MIL/uL — ABNORMAL LOW (ref 4.22–5.81)
RDW: 21.1 % — ABNORMAL HIGH (ref 11.5–15.5)
WBC: 3.3 10*3/uL — ABNORMAL LOW (ref 4.0–10.5)
nRBC: 0.6 % — ABNORMAL HIGH (ref 0.0–0.2)

## 2020-06-23 LAB — LACTATE DEHYDROGENASE: LDH: 227 U/L — ABNORMAL HIGH (ref 98–192)

## 2020-06-23 NOTE — Assessment & Plan Note (Addendum)
#  Relapsed diffuse large B-cell lymphoma-ABC subtype; IV [spleen]; S/p 2 cycles of gemcitabine-carboplatin-day 1; gemcitabine day 8; every 21 days.  Dexamethasone 40 mg daily day 1 through day 4.  PET scan June 19, 2020-slight increase in the SUV uptake of the left neck lymph nodes; no significant improvement noted.  At the same time no worsening or progressive disease  noted anywhere else.  #Given the lack of improvement noted on the PET scan/mildly progressive disease-I recommend discontinuation of current chemotherapy.  I discussed with Dr. Thera Flake at [facility] urgent evaluation for car T-cell therapy as the next line of option.  Discussed that response rates are in a between 70 to 80%; CR rates 50 to 60%.  Patient's undergoing CR are expected to have long remissions.   # Bil acute PE- [nov 2020]-currently on xarelto 10 mg/day/cardiology-stable  # Borderline LOW EF of 45-50%; Sep Echo 50-55% [Dr.Gollan]- on coreg BID-stable  # Handicapped placard: Given to patient.  # DISPOSITION:  # follow up in 3 weeks- MD; labs- cbc/cmp/LDH-Dr.B  # I reviewed the blood work- with the patient in detail; also reviewed the imaging independently [as summarized above]; and with the patient in detail.

## 2020-06-23 NOTE — Patient Instructions (Signed)
Please go to radiology dept; and get the PET scans on the disk. Take the disk to [facility] appt.

## 2020-06-23 NOTE — Progress Notes (Signed)
[facility] NOTE  Patient Care Team: [name], MD as PCP - General (Family Medicine) [name], [name], MD (General Surgery) Cammie Sickle, MD as Consulting Physician (Internal Medicine)  CHIEF COMPLAINTS/PURPOSE OF CONSULTATION: Diffuse large B-cell lymphoma  #  Oncology History Overview Note  #June 29, 2019-CT- bulky bilateral neck adenopathy left more than right; September 2020-Diffuse B-cell lymphoma; ABC subtype; NEGATIVE FISH studies.  PET scan -bulky adenopathy involving neck /lymphadenopathy mediastinum /abdomen/pelvis inguinal lymph nodes; splenic involvement.  Question bone.  Stage IV [spleen involvement; no bone marrow biopsy].  BCL-2: Positive /BCL 6: Positive /MUM-1: Positive /CD10: Negative 'Ki-67: High proliferation index (90%) /CD30: Negative /Cyclin D1: Negative C-MYC: Equivocal (20% staining seen on limited and fragmented tissue) /EBER: Negative   # Ritux-Pred- 9/11; 9/18- R-COP [held adria- sec to EF 45-50%]  #May 2021-PET scan left neck recurrent disease; excisional biopsy [Dr. Vaught]-recurrent diffuse left B-cell lymphoma  #November 2020-bilateral PE; on xarelto  #J07/07/2020- 2021- Gem-CarboDex [II opinion [facility]; Dr. Thera Flake; stem cell transplant eval]; no Rituxan-repeat CD20 negative.   #S/p 2 cycles-gem carbo Dex-June 18, 2020 PET scan-slight increase in SUV uptake of the left cervical lymph nodes; no distant metastatic disease.  Discontinued chemotherapy; refer to CAR-T    # Sep 2020- Borderline low EF/Left ventricular hypokinesis [Dr.Gollan]-s October 9th stress test-nonischemic;   # DIAGNOSIS: DLBCL  STAGE:   4      ;GOALS: Cure  CURRENT/MOST RECENT THERAPY : R-COP/-R-CEOP. [C]    Lymphoma of lymph nodes ([place])  07/03/2019 Initial Diagnosis   Lymphoma of lymph nodes ([place])   07/13/2019 - 12/07/2019 Chemotherapy   The patient had palonosetron (ALOXI) injection 0.25 mg, 0.25 mg, Intravenous,  Once, 7 of 7  cycles Administration: 0.25 mg (07/20/2019), 0.25 mg (08/13/2019), 0.25 mg (11/13/2019), 0.25 mg (09/03/2019), 0.25 mg (10/02/2019), 0.25 mg (10/23/2019), 0.25 mg (12/04/2019) pegfilgrastim (NEULASTA ONPRO KIT) injection 6 mg, 6 mg, Subcutaneous, Once, 2 of 2 cycles Administration: 6 mg (10/04/2019), 6 mg (10/25/2019) pegfilgrastim-cbqv (UDENYCA) injection 6 mg, 6 mg, Subcutaneous, Once, 5 of 5 cycles Administration: 6 mg (07/23/2019), 6 mg (08/16/2019), 6 mg (09/06/2019), 6 mg (11/16/2019), 6 mg (12/07/2019) vinCRIStine (ONCOVIN) 2 mg in sodium chloride 0.9 % 50 mL chemo infusion, 2 mg, Intravenous,  Once, 7 of 7 cycles Administration: 2 mg (07/20/2019), 2 mg (08/13/2019), 2 mg (11/13/2019), 2 mg (09/03/2019), 2 mg (10/02/2019), 2 mg (10/23/2019), 2 mg (12/04/2019) cyclophosphamide (CYTOXAN) 1,900 mg in sodium chloride 0.9 % 250 mL chemo infusion, 750 mg/m2 = 1,900 mg, Intravenous,  Once, 7 of 7 cycles Administration: 1,900 mg (07/20/2019), 1,900 mg (08/13/2019), 2,000 mg (11/13/2019), 1,900 mg (09/03/2019), 2,000 mg (10/02/2019), 2,000 mg (10/23/2019), 2,000 mg (12/04/2019) etoposide (VEPESID) 250 mg in sodium chloride 0.9 % 1,000 mL chemo infusion, 100 mg/m2 = 250 mg, Intravenous,  Once, 6 of 6 cycles Dose modification: 50 mg/m2 (original dose 100 mg/m2, Cycle 3, Reason: Provider Judgment) Administration: 130 mg (08/14/2019), 130 mg (08/15/2019), 130 mg (09/03/2019), 130 mg (09/04/2019), 130 mg (09/05/2019), 130 mg (10/03/2019), 130 mg (10/04/2019), 130 mg (10/24/2019), 130 mg (10/25/2019), 130 mg (11/13/2019), 130 mg (11/14/2019), 130 mg (11/15/2019), 130 mg (10/02/2019), 130 mg (10/23/2019), 130 mg (12/04/2019), 130 mg (12/05/2019), 130 mg (12/06/2019) dexamethasone (DECADRON) 12 mg in sodium chloride 0.9 % 145 mL IVPB, , Intravenous,  Once, 8 of 8 cycles Administration:  (07/13/2019),  (07/20/2019),  (08/13/2019),  (11/13/2019),  (09/03/2019),  (10/02/2019),  (10/23/2019),  (12/04/2019) riTUXimab-pvvr (RUXIENCE) 900 mg in sodium chloride  0.9 %  250 mL (2.6471 mg/mL) infusion, 375 mg/m2 = 900 mg (100 % of original dose 375 mg/m2), Intravenous,  Once, 1 of 1 cycle Dose modification: 375 mg/m2 (original dose 375 mg/m2, Cycle 1) Administration: 900 mg (07/13/2019)  for chemotherapy treatment.    05/09/2020 -  Chemotherapy   The patient had palonosetron (ALOXI) injection 0.25 mg, 0.25 mg, Intravenous,  Once, 2 of 4 cycles Administration: 0.25 mg (05/09/2020), 0.25 mg (05/30/2020) CARBOplatin (PARAPLATIN) 750 mg in sodium chloride 0.9 % 250 mL chemo infusion, 750 mg (100 % of original dose 750 mg), Intravenous,  Once, 2 of 4 cycles Dose modification:   (original dose 750 mg, Cycle 1, Reason: Provider Judgment, Comment: labs from care every where. ) Administration: 750 mg (05/09/2020), 750 mg (05/30/2020) gemcitabine (GEMZAR) 2,500 mg in sodium chloride 0.9 % 250 mL chemo infusion, 2,584 mg, Intravenous,  Once, 2 of 4 cycles Administration: 2,500 mg (05/09/2020), 2,500 mg (05/16/2020), 2,500 mg (05/30/2020), 2,500 mg (06/06/2020) fosaprepitant (EMEND) 150 mg in sodium chloride 0.9 % 145 mL IVPB, 150 mg, Intravenous,  Once, 2 of 4 cycles Administration: 150 mg (05/09/2020), 150 mg (05/30/2020)  for chemotherapy treatment.      HISTORY OF PRESENTING ILLNESS:  Eric [name] 69 y.o.  male relapsed diffuse large B-cell lymphoma currently on GDP chemotherapy is here for follow-up/review results of the restaging PET scan.  Patient denies any new lumps or bumps.  Continues to notice lump in the left neck not significantly worse.  No fevers or chills.  No tingling or numbness.   Review of Systems  Constitutional: Negative for chills, diaphoresis and fever.  HENT: Positive for hearing loss. Negative for nosebleeds and sore throat.   Eyes: Negative for double vision.  Respiratory: Negative for hemoptysis, sputum production and wheezing.   Cardiovascular: Negative for chest pain, palpitations, orthopnea and leg swelling.  Gastrointestinal: Negative for  abdominal pain, blood in stool, constipation, diarrhea, heartburn, melena, nausea and vomiting.  Genitourinary: Negative for dysuria, frequency and urgency.  Musculoskeletal: Negative for back pain and joint pain.  Skin: Negative.  Negative for itching and rash.  Neurological: Negative for dizziness, tingling, focal weakness, weakness and headaches.  Endo/Heme/Allergies: Does not bruise/bleed easily.  Psychiatric/Behavioral: Negative for depression. The patient does not have insomnia.      MEDICAL HISTORY:  Past Medical History:  Diagnosis Date  . Adopted person   . Colon polyps   . Pancreatitis   . Rectal bleeding 02/24/14  . Sepsis ([place]) 09/18/2019    SURGICAL HISTORY: Past Surgical History:  Procedure Laterality Date  . CHOLECYSTECTOMY    . COLONOSCOPY  02/28/14  . IR IMAGING GUIDED PORT INSERTION  07/18/2019  . LYMPH NODE BIOPSY Left 03/28/2020   Procedure: EXCISION OF SUBMANDIBULAR  LYMPH NODE;  Surgeon: Carloyn Manner, MD;  Location: [facility];  Service: ENT;  Laterality: Left;  . POLYPECTOMY     colon polyp removed  . TONSILLECTOMY      SOCIAL HISTORY: Social History   Socioeconomic History  . Marital status: Married    Spouse name: Not on file  . Number of children: Not on file  . Years of education: Not on file  . Highest education level: Not on file  Occupational History    Comment: Retired from [employer]  . Smoking status: Current Every Day Smoker    Packs/day: 1.00    Years: 20.00    Pack years: 20.00    Types: Cigars  . Smokeless tobacco: Never Used  .  Tobacco comment: smoke 1/2 ppd cigaretes since 69 yo. changed to cigars at age July 2004.   Vaping Use  . Vaping Use: Never used  Substance and Sexual Activity  . Alcohol use: Yes    Comment: 6-7 drinks weekly  . Drug use: No  . Sexual activity: Not on file  Other Topics Concern  . Not on file  Social History Narrative   Lives at home with wife; in [place]. retd from  [employer] smoker; drinks liquor. Children - grown up.    Social Determinants of Health   Financial Resource Strain:   . Difficulty of Paying Living Expenses: Not on file  Food Insecurity:   . Worried About Charity fundraiser in the Last Year: Not on file  . Ran Out of Food in the Last Year: Not on file  Transportation Needs:   . Lack of Transportation (Medical): Not on file  . Lack of Transportation (Non-Medical): Not on file  Physical Activity:   . Days of Exercise per Week: Not on file  . Minutes of Exercise per Session: Not on file  Stress:   . Feeling of Stress : Not on file  Social Connections:   . Frequency of Communication with Friends and Family: Not on file  . Frequency of Social Gatherings with Friends and Family: Not on file  . Attends Religious Services: Not on file  . Active Member of Clubs or Organizations: Not on file  . Attends Archivist Meetings: Not on file  . Marital Status: Not on file  Intimate Partner Violence:   . Fear of Current or Ex-Partner: Not on file  . Emotionally Abused: Not on file  . Physically Abused: Not on file  . Sexually Abused: Not on file    FAMILY HISTORY: Family History  Adopted: Yes    ALLERGIES:  is allergic to allopurinol, azithromycin, and losartan potassium.  MEDICATIONS:  Current Outpatient Medications  Medication Sig Dispense Refill  . carvedilol (COREG) 6.25 MG tablet Take 1 tablet (6.25 mg total) by mouth 2 (two) times daily. 180 tablet 3  . dexamethasone (DECADRON) 4 MG tablet Take 10 tablets; once a day x 4 days- Start on the day of chemo 40 tablet 0  . lidocaine-prilocaine (EMLA) cream Apply generously on the port 30-45 prior to accessing the port. 30 g 1  . Multiple Vitamins-Minerals (MULTI-VITAMIN/MINERALS PO) Take 1 capsule by mouth.    . rivaroxaban (XARELTO) 10 MG TABS tablet Take 1 tablet (10 mg total) by mouth daily. 90 tablet 3  . UNABLE TO FIND Med Name: CBD oil    . ondansetron (ZOFRAN) 4  MG tablet Take 1 tablet (4 mg total) by mouth every 8 (eight) hours as needed for up to 10 doses for nausea or vomiting. (Patient not taking: Reported on 05/09/2020) 20 tablet 0   No current facility-administered medications for this visit.      Marland Kitchen  PHYSICAL EXAMINATION: ECOG PERFORMANCE STATUS: 1 - Symptomatic but completely ambulatory  Vitals:   06/23/20 0948  BP: 124/61  Pulse: 73  Resp: 18  Temp: 98 F (36.7 C)  SpO2: 99%   Filed Weights   06/23/20 0948  Weight: 298 lb (135.2 kg)    Physical Exam HENT:     Head: Normocephalic and atraumatic.     Mouth/Throat:     Pharynx: No oropharyngeal exudate.  Eyes:     Pupils: Pupils are equal, round, and reactive to light.  Neck:  Comments: Approximately 2 cm lymph node felt in the left submandibular region Cardiovascular:     Rate and Rhythm: Normal rate and regular rhythm.  Pulmonary:     Effort: Pulmonary effort is normal. No respiratory distress.     Breath sounds: Normal breath sounds. No wheezing.  Abdominal:     General: Bowel sounds are normal. There is no distension.     Palpations: Abdomen is soft. There is no mass.     Tenderness: There is no abdominal tenderness. There is no guarding or rebound.  Musculoskeletal:        General: No tenderness. Normal range of motion.     Cervical back: Normal range of motion and neck supple.  Skin:    General: Skin is warm.  Neurological:     Mental Status: He is alert and oriented to person, place, and time.  Psychiatric:        Mood and Affect: Affect normal.    LABORATORY DATA:  I have reviewed the data as listed Lab Results  Component Value Date   WBC 3.3 (L) 06/23/2020   HGB 11.0 (L) 06/23/2020   HCT 32.1 (L) 06/23/2020   MCV 93.0 06/23/2020   PLT 318 06/23/2020   Recent Labs    05/30/20 0830 06/06/20 0859 06/23/20 0933  NA 138 139 140  K 3.8 4.0 3.8  CL 103 101 105  CO2 $Re'26 28 27  'bwS$ GLUCOSE 136* 131* 119*  BUN $Re'17 23 13  'tOc$ CREATININE 0.86 0.80 0.92   CALCIUM 8.8* 8.9 8.7*  GFRNONAA >60 >60 >60  GFRAA >60 >60 >60  PROT 6.3* 6.5 6.4*  ALBUMIN 4.0 4.0 4.1  AST $Re'22 26 28  'WBU$ ALT 28 60* 41  ALKPHOS 76 80 69  BILITOT 1.0 1.1 0.8    RADIOGRAPHIC STUDIES: I have personally reviewed the radiological images as listed and agreed with the findings in the report. NM PET Image Restag (PS) Skull Base To Thigh  Result Date: 06/18/2020 CLINICAL DATA:  Subsequent treatment strategy for lymphoma. EXAM: NUCLEAR MEDICINE PET SKULL BASE TO THIGH TECHNIQUE: 13.6 mCi F-18 FDG was injected intravenously. Full-ring PET imaging was performed from the skull base to thigh after the radiotracer. CT data was obtained and used for attenuation correction and anatomic localization. Fasting blood glucose: 94 mg/dl COMPARISON:  None. FINDINGS: Mediastinal blood pool activity: SUV max 4.02 Liver activity: SUV max 4.84 NECK: Persistent and mildly progressive FDG uptake is identified within the left cervical lymph node chain. -The index level 2 lymph node measures 1.1 cm and has an SUV max of 7.94. Previously this measured 0.8 cm within SUV max of 5.0. -FDG avid left level 1 lymph node measures 1 cm and has an SUV max of 5.24. Previously 1.1 cm within SUV max of 5.9. -Posterior left level 2 lymph node measures 0.9 cm and has an SUV max of 8.04. New from the previous exam. -Left submental lymph node measures 1 cm with SUV max of 4.94. Previously this node measured 0.7 cm within SUV max of 1.98. Incidental CT findings: none CHEST: No FDG avid axillary, supraclavicular, mediastinal, or hilar lymph nodes. Incidental CT findings: Aortic atherosclerosis with coronary artery calcifications. No suspicious lung nodules. ABDOMEN/PELVIS: No abnormal tracer uptake within the liver, pancreas, or spleen. No abnormal uptake within the adrenal glands. No FDG uptake within the abdominopelvic lymph nodes. Incidental CT findings: Aortic atherosclerosis. Multiple left renal calculi are noted. This includes  a 1.6 by 0.9 cm dominant stone within the central pelvis.  Left kidney cysts are incompletely characterized without IV contrast. Previous cholecystectomy. Scattered calcifications identified within the peritoneal cavity which appears similar to previous exam. SKELETON: Mottled skeletal uptake again noted without definite hypermetabolic marrow disease. Incidental CT findings: none IMPRESSION: 1. Persistent and mildly progressed FDG avid left cervical lymph nodes are identified, Deauville criteria 4 and 5. 2. No signs of recurrent disease within the chest, abdomen or pelvis. 3. Aortic Atherosclerosis (ICD10-I70.0). Coronary artery calcifications 4. Left renal calculi and left kidney cysts. Electronically Signed   By: Kerby Moors M.D.   On: 06/18/2020 16:04    ASSESSMENT & PLAN:   Lymphoma of lymph nodes ([facility]) #Relapsed diffuse large B-cell lymphoma-ABC subtype; IV [spleen]; S/p 2 cycles of gemcitabine-carboplatin-day 1; gemcitabine day 8; every 21 days.  Dexamethasone 40 mg daily day 1 through day 4.  PET scan June 19, 2020-slight increase in the SUV uptake of the left neck lymph nodes; no significant improvement noted.  At the same time no worsening or progressive disease  noted anywhere else.  #Given the lack of improvement noted on the PET scan/mildly progressive disease-I recommend discontinuation of current chemotherapy.  I discussed with Dr. Thera Flake at [facility] urgent evaluation for car T-cell therapy as the next line of option.  Discussed that response rates are in a between 70 to 80%; CR rates 50 to 60%.  Patient's undergoing CR are expected to have long remissions.   # Bil acute PE- [nov 2020]-currently on xarelto 10 mg/day/cardiology-stable  # Borderline LOW EF of 45-50%; Sep Echo 50-55% [Dr.Gollan]- on coreg BID-stable  # Handicapped placard: Given to patient.  # DISPOSITION:  # follow up in 3 weeks- MD; labs- cbc/cmp/LDH-Dr.B  # I reviewed the blood work- with the patient in  detail; also reviewed the imaging independently [as summarized above]; and with the patient in detail.   All questions were answered. The patient knows to call the clinic with any problems, questions or concerns.    Cammie Sickle, MD 06/23/2020 2:42 PM

## 2020-06-25 ENCOUNTER — Encounter: Payer: Self-pay | Admitting: Internal Medicine

## 2020-07-14 ENCOUNTER — Ambulatory Visit: Payer: Medicare Other | Admitting: Internal Medicine

## 2020-07-14 ENCOUNTER — Other Ambulatory Visit: Payer: Medicare Other

## 2020-07-22 ENCOUNTER — Encounter: Payer: Self-pay | Admitting: Internal Medicine

## 2020-07-22 DIAGNOSIS — C8330 Diffuse large B-cell lymphoma, unspecified site: Secondary | ICD-10-CM | POA: Diagnosis not present

## 2020-07-23 DIAGNOSIS — C8331 Diffuse large B-cell lymphoma, lymph nodes of head, face, and neck: Secondary | ICD-10-CM | POA: Diagnosis not present

## 2020-07-24 ENCOUNTER — Inpatient Hospital Stay: Payer: Medicare Other

## 2020-07-24 ENCOUNTER — Encounter: Payer: Self-pay | Admitting: Internal Medicine

## 2020-07-24 ENCOUNTER — Other Ambulatory Visit: Payer: Self-pay

## 2020-07-24 ENCOUNTER — Inpatient Hospital Stay: Payer: Medicare Other | Attending: Internal Medicine | Admitting: Internal Medicine

## 2020-07-24 ENCOUNTER — Encounter: Payer: Self-pay | Admitting: Family Medicine

## 2020-07-24 DIAGNOSIS — Z7952 Long term (current) use of systemic steroids: Secondary | ICD-10-CM | POA: Diagnosis not present

## 2020-07-24 DIAGNOSIS — C8331 Diffuse large B-cell lymphoma, lymph nodes of head, face, and neck: Secondary | ICD-10-CM | POA: Diagnosis not present

## 2020-07-24 DIAGNOSIS — C8590 Non-Hodgkin lymphoma, unspecified, unspecified site: Secondary | ICD-10-CM | POA: Diagnosis not present

## 2020-07-24 DIAGNOSIS — Z9221 Personal history of antineoplastic chemotherapy: Secondary | ICD-10-CM | POA: Diagnosis not present

## 2020-07-24 DIAGNOSIS — Z86711 Personal history of pulmonary embolism: Secondary | ICD-10-CM | POA: Diagnosis not present

## 2020-07-24 DIAGNOSIS — F17290 Nicotine dependence, other tobacco product, uncomplicated: Secondary | ICD-10-CM | POA: Insufficient documentation

## 2020-07-24 DIAGNOSIS — Z79899 Other long term (current) drug therapy: Secondary | ICD-10-CM | POA: Insufficient documentation

## 2020-07-24 DIAGNOSIS — Z7901 Long term (current) use of anticoagulants: Secondary | ICD-10-CM | POA: Diagnosis not present

## 2020-07-24 NOTE — Assessment & Plan Note (Addendum)
#  Relapsed diffuse large B-cell lymphoma-ABC subtype; IV [spleen]; S/p 2 cycles of gemcitabine-carboplatin-Dex- s/p 2 cycles-  PET scan June 19, 2020-slight increase in the SUV uptake of the left neck lymph nodes; no significant improvement noted.  Today clinically, noted to progressive disease in the left neck.  However patient is asymptomatic.  #Given chemotherapy refractory disease-patient not a candidate for autologous stem cell transplant.  Candidate for CAR-T therapy.  Discussed with Dr. Leilani Able.   # Bil acute PE- [nov 2020]-currently on xarelto 10 mg/day/cardiology-stable.  # Borderline LOW EF of 45-50%; Sep Echo 50-55% [Dr.Gollan]- on coreg BID-stable.  We will repeat 2D echo as per recommendations from [facility].  # DISPOSITION:  # follow up TBD--Dr.B

## 2020-07-24 NOTE — Progress Notes (Signed)
[place] NOTE  Patient Care Team: [name], MD as PCP - General (Family Medicine) [name], [name], MD (General Surgery) Cammie Sickle, MD as Consulting Physician (Internal Medicine)  CHIEF COMPLAINTS/PURPOSE OF CONSULTATION: Diffuse large B-cell lymphoma  #  Oncology History Overview Note  #June 29, 2019-CT- bulky bilateral neck adenopathy left more than right; September 2020-Diffuse B-cell lymphoma; ABC subtype; NEGATIVE FISH studies.  PET scan -bulky adenopathy involving neck /lymphadenopathy mediastinum /abdomen/pelvis inguinal lymph nodes; splenic involvement.  Question bone.  Stage IV [spleen involvement; no bone marrow biopsy].  BCL-2: Positive /BCL 6: Positive /MUM-1: Positive /CD10: Negative 'Ki-67: High proliferation index (90%) /CD30: Negative /Cyclin D1: Negative C-MYC: Equivocal (20% staining seen on limited and fragmented tissue) /EBER: Negative   # Ritux-Pred- 9/11; 9/18- R-COP [held adria- sec to EF 45-50%]  #May 2021-PET scan left neck recurrent disease; excisional biopsy [Dr. Vaught]-recurrent diffuse left B-cell lymphoma  #November 2020-bilateral PE; on xarelto  #J07/07/2020- 2021- Gem-CarboDex [II opinion [facility]; Dr. Thera Flake; stem cell transplant eval]; no Rituxan-repeat CD20 negative.   #S/p 2 cycles-gem carbo Dex-June 18, 2020 PET scan-slight increase in SUV uptake of the left cervical lymph nodes; no distant metastatic disease.  Discontinued chemotherapy; refer to CAR-T    # Sep 2020- Borderline low EF/Left ventricular hypokinesis [Dr.Gollan]-s October 9th stress test-nonischemic;   # DIAGNOSIS: DLBCL  STAGE:   4      ;GOALS: Cure  CURRENT/MOST RECENT THERAPY : R-COP/-R-CEOP. [C]    Lymphoma of lymph nodes ([place])  07/03/2019 Initial Diagnosis   Lymphoma of lymph nodes ([place])   07/13/2019 - 12/07/2019 Chemotherapy   The patient had palonosetron (ALOXI) injection 0.25 mg, 0.25 mg, Intravenous,  Once, 7 of 7  cycles Administration: 0.25 mg (07/20/2019), 0.25 mg (08/13/2019), 0.25 mg (11/13/2019), 0.25 mg (09/03/2019), 0.25 mg (10/02/2019), 0.25 mg (10/23/2019), 0.25 mg (12/04/2019) pegfilgrastim (NEULASTA ONPRO KIT) injection 6 mg, 6 mg, Subcutaneous, Once, 2 of 2 cycles Administration: 6 mg (10/04/2019), 6 mg (10/25/2019) pegfilgrastim-cbqv (UDENYCA) injection 6 mg, 6 mg, Subcutaneous, Once, 5 of 5 cycles Administration: 6 mg (07/23/2019), 6 mg (08/16/2019), 6 mg (09/06/2019), 6 mg (11/16/2019), 6 mg (12/07/2019) vinCRIStine (ONCOVIN) 2 mg in sodium chloride 0.9 % 50 mL chemo infusion, 2 mg, Intravenous,  Once, 7 of 7 cycles Administration: 2 mg (07/20/2019), 2 mg (08/13/2019), 2 mg (11/13/2019), 2 mg (09/03/2019), 2 mg (10/02/2019), 2 mg (10/23/2019), 2 mg (12/04/2019) cyclophosphamide (CYTOXAN) 1,900 mg in sodium chloride 0.9 % 250 mL chemo infusion, 750 mg/m2 = 1,900 mg, Intravenous,  Once, 7 of 7 cycles Administration: 1,900 mg (07/20/2019), 1,900 mg (08/13/2019), 2,000 mg (11/13/2019), 1,900 mg (09/03/2019), 2,000 mg (10/02/2019), 2,000 mg (10/23/2019), 2,000 mg (12/04/2019) etoposide (VEPESID) 250 mg in sodium chloride 0.9 % 1,000 mL chemo infusion, 100 mg/m2 = 250 mg, Intravenous,  Once, 6 of 6 cycles Dose modification: 50 mg/m2 (original dose 100 mg/m2, Cycle 3, Reason: Provider Judgment) Administration: 130 mg (08/14/2019), 130 mg (08/15/2019), 130 mg (09/03/2019), 130 mg (09/04/2019), 130 mg (09/05/2019), 130 mg (10/03/2019), 130 mg (10/04/2019), 130 mg (10/24/2019), 130 mg (10/25/2019), 130 mg (11/13/2019), 130 mg (11/14/2019), 130 mg (11/15/2019), 130 mg (10/02/2019), 130 mg (10/23/2019), 130 mg (12/04/2019), 130 mg (12/05/2019), 130 mg (12/06/2019) dexamethasone (DECADRON) 12 mg in sodium chloride 0.9 % 145 mL IVPB, , Intravenous,  Once, 8 of 8 cycles Administration:  (07/13/2019),  (07/20/2019),  (08/13/2019),  (11/13/2019),  (09/03/2019),  (10/02/2019),  (10/23/2019),  (12/04/2019) riTUXimab-pvvr (RUXIENCE) 900 mg in sodium chloride  0.9 %  250 mL (2.6471 mg/mL) infusion, 375 mg/m2 = 900 mg (100 % of original dose 375 mg/m2), Intravenous,  Once, 1 of 1 cycle Dose modification: 375 mg/m2 (original dose 375 mg/m2, Cycle 1) Administration: 900 mg (07/13/2019)  for chemotherapy treatment.    05/09/2020 -  Chemotherapy   The patient had palonosetron (ALOXI) injection 0.25 mg, 0.25 mg, Intravenous,  Once, 2 of 4 cycles Administration: 0.25 mg (05/09/2020), 0.25 mg (05/30/2020) CARBOplatin (PARAPLATIN) 750 mg in sodium chloride 0.9 % 250 mL chemo infusion, 750 mg (100 % of original dose 750 mg), Intravenous,  Once, 2 of 4 cycles Dose modification:   (original dose 750 mg, Cycle 1, Reason: Provider Judgment, Comment: labs from care every where. ) Administration: 750 mg (05/09/2020), 750 mg (05/30/2020) gemcitabine (GEMZAR) 2,500 mg in sodium chloride 0.9 % 250 mL chemo infusion, 2,584 mg, Intravenous,  Once, 2 of 4 cycles Administration: 2,500 mg (05/09/2020), 2,500 mg (05/16/2020), 2,500 mg (05/30/2020), 2,500 mg (06/06/2020) fosaprepitant (EMEND) 150 mg in sodium chloride 0.9 % 145 mL IVPB, 150 mg, Intravenous,  Once, 2 of 4 cycles Administration: 150 mg (05/09/2020), 150 mg (05/30/2020)  for chemotherapy treatment.      HISTORY OF PRESENTING ILLNESS:  Eric [name] 69 y.o.  male relapsed diffuse large B-cell lymphoma currently s/p 2 cycles of GDP chemotherapy-with progressive disease is here for follow-up.  In the interim patient was evaluated at [facility] for CAR-T therapy.  Currently awaiting insurance/prior work-up.  Patient notes to have increasing swelling of the left neck.  Denies any pain.  Denies any difficulty swallowing or pain with swallowing.  Denies any difficulty breathing.  No fever no chills.  No nausea no vomiting.  Review of Systems  Constitutional: Negative for chills, diaphoresis and fever.  HENT: Positive for hearing loss. Negative for nosebleeds and sore throat.   Eyes: Negative for double vision.  Respiratory: Negative for  hemoptysis, sputum production and wheezing.   Cardiovascular: Negative for chest pain, palpitations, orthopnea and leg swelling.  Gastrointestinal: Negative for abdominal pain, blood in stool, constipation, diarrhea, heartburn, melena, nausea and vomiting.  Genitourinary: Negative for dysuria, frequency and urgency.  Musculoskeletal: Negative for back pain and joint pain.  Skin: Negative.  Negative for itching and rash.  Neurological: Negative for dizziness, tingling, focal weakness, weakness and headaches.  Endo/Heme/Allergies: Does not bruise/bleed easily.  Psychiatric/Behavioral: Negative for depression. The patient does not have insomnia.      MEDICAL HISTORY:  Past Medical History:  Diagnosis Date  . Adopted person   . Colon polyps   . Pancreatitis   . Rectal bleeding 02/24/14  . Sepsis ([place]) 09/18/2019    SURGICAL HISTORY: Past Surgical History:  Procedure Laterality Date  . CHOLECYSTECTOMY    . COLONOSCOPY  02/28/14  . IR IMAGING GUIDED PORT INSERTION  07/18/2019  . LYMPH NODE BIOPSY Left 03/28/2020   Procedure: EXCISION OF SUBMANDIBULAR  LYMPH NODE;  Surgeon: Carloyn Manner, MD;  Location: [facility];  Service: ENT;  Laterality: Left;  . POLYPECTOMY     colon polyp removed  . TONSILLECTOMY      SOCIAL HISTORY: Social History   Socioeconomic History  . Marital status: Married    Spouse name: Not on file  . Number of children: Not on file  . Years of education: Not on file  . Highest education level: Not on file  Occupational History    Comment: Retired from [employer]  . Smoking status: Current Every Day Smoker  Packs/day: 1.00    Years: 20.00    Pack years: 20.00    Types: Cigars  . Smokeless tobacco: Never Used  . Tobacco comment: smoke 1/2 ppd cigaretes since 69 yo. changed to cigars at age July 2004.   Vaping Use  . Vaping Use: Never used  Substance and Sexual Activity  . Alcohol use: Yes    Comment: 6-7 drinks weekly  . Drug  use: No  . Sexual activity: Not on file  Other Topics Concern  . Not on file  Social History Narrative   Lives at home with wife; in [place]. retd from [employer] smoker; drinks liquor. Children - grown up.    Social Determinants of Health   Financial Resource Strain:   . Difficulty of Paying Living Expenses: Not on file  Food Insecurity:   . Worried About Charity fundraiser in the Last Year: Not on file  . Ran Out of Food in the Last Year: Not on file  Transportation Needs:   . Lack of Transportation (Medical): Not on file  . Lack of Transportation (Non-Medical): Not on file  Physical Activity:   . Days of Exercise per Week: Not on file  . Minutes of Exercise per Session: Not on file  Stress:   . Feeling of Stress : Not on file  Social Connections:   . Frequency of Communication with Friends and Family: Not on file  . Frequency of Social Gatherings with Friends and Family: Not on file  . Attends Religious Services: Not on file  . Active Member of Clubs or Organizations: Not on file  . Attends Archivist Meetings: Not on file  . Marital Status: Not on file  Intimate Partner Violence:   . Fear of Current or Ex-Partner: Not on file  . Emotionally Abused: Not on file  . Physically Abused: Not on file  . Sexually Abused: Not on file    FAMILY HISTORY: Family History  Adopted: Yes    ALLERGIES:  is allergic to allopurinol, azithromycin, and losartan potassium.  MEDICATIONS:  Current Outpatient Medications  Medication Sig Dispense Refill  . carvedilol (COREG) 6.25 MG tablet Take 1 tablet (6.25 mg total) by mouth 2 (two) times daily. 180 tablet 3  . lidocaine-prilocaine (EMLA) cream Apply generously on the port 30-45 prior to accessing the port. 30 g 1  . Multiple Vitamins-Minerals (MULTI-VITAMIN/MINERALS PO) Take 1 capsule by mouth.    . rivaroxaban (XARELTO) 10 MG TABS tablet Take 1 tablet (10 mg total) by mouth daily. 90 tablet 3  . UNABLE TO FIND Med  Name: CBD oil    . dexamethasone (DECADRON) 4 MG tablet Take 10 tablets; once a day x 4 days- Start on the day of chemo (Patient not taking: Reported on 07/24/2020) 40 tablet 0  . ondansetron (ZOFRAN) 4 MG tablet Take 1 tablet (4 mg total) by mouth every 8 (eight) hours as needed for up to 10 doses for nausea or vomiting. (Patient not taking: Reported on 05/09/2020) 20 tablet 0   No current facility-administered medications for this visit.      Marland Kitchen  PHYSICAL EXAMINATION: ECOG PERFORMANCE STATUS: 1 - Symptomatic but completely ambulatory  Vitals:   07/24/20 1038  BP: 126/76  Pulse: 73  Resp: 18  Temp: (!) 97.4 F (36.3 C)  SpO2: 99%   Filed Weights   07/24/20 1038  Weight: 298 lb (135.2 kg)    Physical Exam HENT:     Head: Normocephalic and atraumatic.  Mouth/Throat:     Pharynx: No oropharyngeal exudate.  Eyes:     Pupils: Pupils are equal, round, and reactive to light.  Neck:     Comments: Approximately 2 to 3 cm x 2 lymph nodes felt in the left submandibular region Cardiovascular:     Rate and Rhythm: Normal rate and regular rhythm.  Pulmonary:     Effort: Pulmonary effort is normal. No respiratory distress.     Breath sounds: Normal breath sounds. No wheezing.  Abdominal:     General: Bowel sounds are normal. There is no distension.     Palpations: Abdomen is soft. There is no mass.     Tenderness: There is no abdominal tenderness. There is no guarding or rebound.  Musculoskeletal:        General: No tenderness. Normal range of motion.     Cervical back: Normal range of motion and neck supple.  Skin:    General: Skin is warm.  Neurological:     Mental Status: He is alert and oriented to person, place, and time.  Psychiatric:        Mood and Affect: Affect normal.    LABORATORY DATA:  I have reviewed the data as listed Lab Results  Component Value Date   WBC 3.3 (L) 06/23/2020   HGB 11.0 (L) 06/23/2020   HCT 32.1 (L) 06/23/2020   MCV 93.0 06/23/2020    PLT 318 06/23/2020   Recent Labs    05/30/20 0830 06/06/20 0859 06/23/20 0933  NA 138 139 140  K 3.8 4.0 3.8  CL 103 101 105  CO2 $Re'26 28 27  'xdU$ GLUCOSE 136* 131* 119*  BUN $Re'17 23 13  'NGr$ CREATININE 0.86 0.80 0.92  CALCIUM 8.8* 8.9 8.7*  GFRNONAA >60 >60 >60  GFRAA >60 >60 >60  PROT 6.3* 6.5 6.4*  ALBUMIN 4.0 4.0 4.1  AST $Re'22 26 28  'AUd$ ALT 28 60* 41  ALKPHOS 76 80 69  BILITOT 1.0 1.1 0.8    RADIOGRAPHIC STUDIES: I have personally reviewed the radiological images as listed and agreed with the findings in the report. No results found.  ASSESSMENT & PLAN:   Lymphoma of lymph nodes ([place]) #Relapsed diffuse large B-cell lymphoma-ABC subtype; IV [spleen]; S/p 2 cycles of gemcitabine-carboplatin-Dex- s/p 2 cycles-  PET scan June 19, 2020-slight increase in the SUV uptake of the left neck lymph nodes; no significant improvement noted.  Today clinically, noted to progressive disease in the left neck.  However patient is asymptomatic.  #Given chemotherapy refractory disease-patient not a candidate for autologous stem cell transplant.  Candidate for CAR-T therapy.  Discussed with Dr. Leilani Able.   # Bil acute PE- [nov 2020]-currently on xarelto 10 mg/day/cardiology-stable.  # Borderline LOW EF of 45-50%; Sep Echo 50-55% [Dr.Gollan]- on coreg BID-stable.  We will repeat 2D echo as per recommendations from [facility].  # DISPOSITION:  # follow up TBD--Dr.B   All questions were answered. The patient knows to call the clinic with any problems, questions or concerns.    Cammie Sickle, MD 07/25/2020 8:24 AM

## 2020-07-25 ENCOUNTER — Telehealth: Payer: Self-pay | Admitting: Internal Medicine

## 2020-07-25 DIAGNOSIS — I420 Dilated cardiomyopathy: Secondary | ICD-10-CM

## 2020-07-25 NOTE — Telephone Encounter (Signed)
Discussed with Dr.Grover-recommend 2D echo in preparation for CAR-T therapy.  H/T-please inform patient regarding 2D echo/as above. Echo is ordered please schedule ASAP.  Thanks

## 2020-07-25 NOTE — Telephone Encounter (Signed)
LVM for pt to call back in regards to message below.  

## 2020-07-25 NOTE — Telephone Encounter (Signed)
Spoke with pt in regards and he stated he has an echo scheduled for 10/5.

## 2020-07-30 ENCOUNTER — Encounter: Payer: Self-pay | Admitting: Internal Medicine

## 2020-08-05 ENCOUNTER — Ambulatory Visit: Payer: Medicare Other

## 2020-08-07 DIAGNOSIS — Z01810 Encounter for preprocedural cardiovascular examination: Secondary | ICD-10-CM | POA: Diagnosis not present

## 2020-08-07 DIAGNOSIS — I77810 Thoracic aortic ectasia: Secondary | ICD-10-CM | POA: Diagnosis not present

## 2020-08-07 DIAGNOSIS — C8330 Diffuse large B-cell lymphoma, unspecified site: Secondary | ICD-10-CM | POA: Diagnosis not present

## 2020-08-07 DIAGNOSIS — C8331 Diffuse large B-cell lymphoma, lymph nodes of head, face, and neck: Secondary | ICD-10-CM | POA: Diagnosis not present

## 2020-08-07 DIAGNOSIS — I371 Nonrheumatic pulmonary valve insufficiency: Secondary | ICD-10-CM | POA: Diagnosis not present

## 2020-08-11 DIAGNOSIS — Z79899 Other long term (current) drug therapy: Secondary | ICD-10-CM | POA: Diagnosis not present

## 2020-08-11 DIAGNOSIS — R161 Splenomegaly, not elsewhere classified: Secondary | ICD-10-CM | POA: Diagnosis not present

## 2020-08-11 DIAGNOSIS — C8331 Diffuse large B-cell lymphoma, lymph nodes of head, face, and neck: Secondary | ICD-10-CM | POA: Diagnosis not present

## 2020-08-13 ENCOUNTER — Encounter: Payer: Self-pay | Admitting: *Deleted

## 2020-08-13 DIAGNOSIS — C8331 Diffuse large B-cell lymphoma, lymph nodes of head, face, and neck: Secondary | ICD-10-CM | POA: Diagnosis not present

## 2020-08-14 ENCOUNTER — Encounter: Payer: Self-pay | Admitting: Internal Medicine

## 2020-08-14 ENCOUNTER — Telehealth: Payer: Self-pay | Admitting: Internal Medicine

## 2020-08-14 DIAGNOSIS — C8331 Diffuse large B-cell lymphoma, lymph nodes of head, face, and neck: Secondary | ICD-10-CM | POA: Diagnosis not present

## 2020-08-14 DIAGNOSIS — C8330 Diffuse large B-cell lymphoma, unspecified site: Secondary | ICD-10-CM | POA: Diagnosis not present

## 2020-08-14 DIAGNOSIS — Z452 Encounter for adjustment and management of vascular access device: Secondary | ICD-10-CM | POA: Diagnosis not present

## 2020-08-14 DIAGNOSIS — Z52011 Autologous donor, stem cells: Secondary | ICD-10-CM | POA: Diagnosis not present

## 2020-08-14 NOTE — Telephone Encounter (Signed)
On 10/13-spoke to patient regarding my discussion with Dr. Thera Flake; recommend bridging therapy prior to CAR-T therapy.  Patient awaiting pheresis on 10/14.  Reminded patient to follow-up with me on 10/15.  Patient verbalized that appointment.

## 2020-08-15 ENCOUNTER — Other Ambulatory Visit: Payer: Self-pay

## 2020-08-15 ENCOUNTER — Inpatient Hospital Stay: Payer: Medicare Other | Attending: Internal Medicine

## 2020-08-15 ENCOUNTER — Other Ambulatory Visit: Payer: Self-pay | Admitting: *Deleted

## 2020-08-15 ENCOUNTER — Telehealth: Payer: Self-pay | Admitting: *Deleted

## 2020-08-15 ENCOUNTER — Encounter: Payer: Self-pay | Admitting: Internal Medicine

## 2020-08-15 ENCOUNTER — Inpatient Hospital Stay (HOSPITAL_BASED_OUTPATIENT_CLINIC_OR_DEPARTMENT_OTHER): Payer: Medicare Other | Admitting: Internal Medicine

## 2020-08-15 DIAGNOSIS — C8590 Non-Hodgkin lymphoma, unspecified, unspecified site: Secondary | ICD-10-CM | POA: Diagnosis not present

## 2020-08-15 DIAGNOSIS — Z86711 Personal history of pulmonary embolism: Secondary | ICD-10-CM | POA: Diagnosis not present

## 2020-08-15 DIAGNOSIS — Z7901 Long term (current) use of anticoagulants: Secondary | ICD-10-CM | POA: Diagnosis not present

## 2020-08-15 DIAGNOSIS — C8338 Diffuse large B-cell lymphoma, lymph nodes of multiple sites: Secondary | ICD-10-CM | POA: Diagnosis not present

## 2020-08-15 LAB — COMPREHENSIVE METABOLIC PANEL
ALT: 25 U/L (ref 0–44)
AST: 32 U/L (ref 15–41)
Albumin: 4.1 g/dL (ref 3.5–5.0)
Alkaline Phosphatase: 68 U/L (ref 38–126)
Anion gap: 10 (ref 5–15)
BUN: 19 mg/dL (ref 8–23)
CO2: 28 mmol/L (ref 22–32)
Calcium: 8.7 mg/dL — ABNORMAL LOW (ref 8.9–10.3)
Chloride: 104 mmol/L (ref 98–111)
Creatinine, Ser: 0.91 mg/dL (ref 0.61–1.24)
GFR, Estimated: >60 mL/min (ref >60)
Glucose, Bld: 120 mg/dL — ABNORMAL HIGH (ref 70–99)
Potassium: 3.8 mmol/L (ref 3.5–5.1)
Sodium: 142 mmol/L (ref 135–145)
Total Bilirubin: 2.1 mg/dL — ABNORMAL HIGH (ref 0.3–1.2)
Total Protein: 6.3 g/dL — ABNORMAL LOW (ref 6.5–8.1)

## 2020-08-15 LAB — CBC WITH DIFFERENTIAL/PLATELET
Abs Immature Granulocytes: 0.05 10*3/uL (ref 0.00–0.07)
Basophils Absolute: 0 10*3/uL (ref 0.0–0.1)
Basophils Relative: 0 %
Eosinophils Absolute: 0.1 10*3/uL (ref 0.0–0.5)
Eosinophils Relative: 3 %
HCT: 27.8 % — ABNORMAL LOW (ref 39.0–52.0)
Hemoglobin: 9.4 g/dL — ABNORMAL LOW (ref 13.0–17.0)
Immature Granulocytes: 1 %
Lymphocytes Relative: 13 %
Lymphs Abs: 0.5 10*3/uL — ABNORMAL LOW (ref 0.7–4.0)
MCH: 32.8 pg (ref 26.0–34.0)
MCHC: 33.8 g/dL (ref 30.0–36.0)
MCV: 96.9 fL (ref 80.0–100.0)
Monocytes Absolute: 0.5 10*3/uL (ref 0.1–1.0)
Monocytes Relative: 14 %
Neutro Abs: 2.6 10*3/uL (ref 1.7–7.7)
Neutrophils Relative %: 69 %
Platelets: 128 10*3/uL — ABNORMAL LOW (ref 150–400)
RBC: 2.87 MIL/uL — ABNORMAL LOW (ref 4.22–5.81)
RDW: 17.7 % — ABNORMAL HIGH (ref 11.5–15.5)
WBC: 3.7 10*3/uL — ABNORMAL LOW (ref 4.0–10.5)
nRBC: 0 % (ref 0.0–0.2)

## 2020-08-15 LAB — LACTATE DEHYDROGENASE: LDH: 393 U/L — ABNORMAL HIGH (ref 98–192)

## 2020-08-15 MED ORDER — ACYCLOVIR 400 MG PO TABS: 400.0000 mg | ORAL_TABLET | Freq: Two times a day (BID) | ORAL | 3 refills | Status: DC

## 2020-08-15 MED ORDER — SULFAMETHOXAZOLE-TRIMETHOPRIM 800-160 MG PO TABS: 1.0000 | ORAL_TABLET | Freq: Two times a day (BID) | ORAL | 1 refills | Status: DC

## 2020-08-15 MED ORDER — ACYCLOVIR 400 MG PO TABS
400.0000 mg | ORAL_TABLET | Freq: Two times a day (BID) | ORAL | 3 refills | Status: DC
Start: 2020-08-15 — End: 2021-05-13

## 2020-08-15 NOTE — Telephone Encounter (Signed)
Dr. B- prescriber error occurred when submitting these prescriptions due to error within the system. Please resubmit these prescriptions. 

## 2020-08-15 NOTE — Progress Notes (Signed)
[place] NOTE  Patient Care Team: [name], MD as PCP - General (Family Medicine) [name], [name], MD (General Surgery) Cammie Sickle, MD as Consulting Physician (Internal Medicine)  CHIEF COMPLAINTS/PURPOSE OF CONSULTATION: Diffuse large B-cell lymphoma  #  Oncology History Overview Note  #June 29, 2019-CT- bulky bilateral neck adenopathy left more than right; September 2020-Diffuse B-cell lymphoma; ABC subtype; NEGATIVE FISH studies.  PET scan -bulky adenopathy involving neck /lymphadenopathy mediastinum /abdomen/pelvis inguinal lymph nodes; splenic involvement.  Question bone.  Stage IV [spleen involvement; no bone marrow biopsy].  BCL-2: Positive /BCL 6: Positive /MUM-1: Positive /CD10: Negative 'Ki-67: High proliferation index (90%) /CD30: Negative /Cyclin D1: Negative C-MYC: Equivocal (20% staining seen on limited and fragmented tissue) /EBER: Negative   # Ritux-Pred- 9/11; 9/18- R-COP [held adria- sec to EF 45-50%]  #May 2021-PET scan left neck recurrent disease; excisional biopsy [Dr. Vaught]-recurrent diffuse left B-cell lymphoma  #November 2020-bilateral PE; on xarelto  #J07/07/2020- 2021- Gem-CarboDex [II opinion [facility]; Dr. Thera Flake; stem cell transplant eval]; no Rituxan-repeat CD20 negative.   #S/p 2 cycles-gem carbo Dex-June 18, 2020 PET scan-slight increase in SUV uptake of the left cervical lymph nodes; no distant metastatic disease.  Discontinued chemotherapy; refer to CAR-T    # Sep 2020- Borderline low EF/Left ventricular hypokinesis [Dr.Gollan]-s October 9th stress test-nonischemic;   # DIAGNOSIS: DLBCL  STAGE:   4      ;GOALS: Cure  CURRENT/MOST RECENT THERAPY : R-COP/-R-CEOP. [C]    Lymphoma of lymph nodes ([place])  07/03/2019 Initial Diagnosis   Lymphoma of lymph nodes ([place])   07/13/2019 - 12/07/2019 Chemotherapy   The patient had palonosetron (ALOXI) injection 0.25 mg, 0.25 mg, Intravenous,  Once, 7 of 7  cycles Administration: 0.25 mg (07/20/2019), 0.25 mg (08/13/2019), 0.25 mg (11/13/2019), 0.25 mg (09/03/2019), 0.25 mg (10/02/2019), 0.25 mg (10/23/2019), 0.25 mg (12/04/2019) pegfilgrastim (NEULASTA ONPRO KIT) injection 6 mg, 6 mg, Subcutaneous, Once, 2 of 2 cycles Administration: 6 mg (10/04/2019), 6 mg (10/25/2019) pegfilgrastim-cbqv (UDENYCA) injection 6 mg, 6 mg, Subcutaneous, Once, 5 of 5 cycles Administration: 6 mg (07/23/2019), 6 mg (08/16/2019), 6 mg (09/06/2019), 6 mg (11/16/2019), 6 mg (12/07/2019) vinCRIStine (ONCOVIN) 2 mg in sodium chloride 0.9 % 50 mL chemo infusion, 2 mg, Intravenous,  Once, 7 of 7 cycles Administration: 2 mg (07/20/2019), 2 mg (08/13/2019), 2 mg (11/13/2019), 2 mg (09/03/2019), 2 mg (10/02/2019), 2 mg (10/23/2019), 2 mg (12/04/2019) cyclophosphamide (CYTOXAN) 1,900 mg in sodium chloride 0.9 % 250 mL chemo infusion, 750 mg/m2 = 1,900 mg, Intravenous,  Once, 7 of 7 cycles Administration: 1,900 mg (07/20/2019), 1,900 mg (08/13/2019), 2,000 mg (11/13/2019), 1,900 mg (09/03/2019), 2,000 mg (10/02/2019), 2,000 mg (10/23/2019), 2,000 mg (12/04/2019) etoposide (VEPESID) 250 mg in sodium chloride 0.9 % 1,000 mL chemo infusion, 100 mg/m2 = 250 mg, Intravenous,  Once, 6 of 6 cycles Dose modification: 50 mg/m2 (original dose 100 mg/m2, Cycle 3, Reason: Provider Judgment) Administration: 130 mg (08/14/2019), 130 mg (08/15/2019), 130 mg (09/03/2019), 130 mg (09/04/2019), 130 mg (09/05/2019), 130 mg (10/03/2019), 130 mg (10/04/2019), 130 mg (10/24/2019), 130 mg (10/25/2019), 130 mg (11/13/2019), 130 mg (11/14/2019), 130 mg (11/15/2019), 130 mg (10/02/2019), 130 mg (10/23/2019), 130 mg (12/04/2019), 130 mg (12/05/2019), 130 mg (12/06/2019) dexamethasone (DECADRON) 12 mg in sodium chloride 0.9 % 145 mL IVPB, , Intravenous,  Once, 8 of 8 cycles Administration:  (07/13/2019),  (07/20/2019),  (08/13/2019),  (11/13/2019),  (09/03/2019),  (10/02/2019),  (10/23/2019),  (12/04/2019) riTUXimab-pvvr (RUXIENCE) 900 mg in sodium chloride  0.9 %  250 mL (2.6471 mg/mL) infusion, 375 mg/m2 = 900 mg (100 % of original dose 375 mg/m2), Intravenous,  Once, 1 of 1 cycle Dose modification: 375 mg/m2 (original dose 375 mg/m2, Cycle 1) Administration: 900 mg (07/13/2019)  for chemotherapy treatment.    05/09/2020 - 06/06/2020 Chemotherapy   The patient had palonosetron (ALOXI) injection 0.25 mg, 0.25 mg, Intravenous,  Once, 2 of 4 cycles Administration: 0.25 mg (05/09/2020), 0.25 mg (05/30/2020) CARBOplatin (PARAPLATIN) 750 mg in sodium chloride 0.9 % 250 mL chemo infusion, 750 mg (100 % of original dose 750 mg), Intravenous,  Once, 2 of 4 cycles Dose modification:   (original dose 750 mg, Cycle 1, Reason: Provider Judgment, Comment: labs from care every where. ) Administration: 750 mg (05/09/2020), 750 mg (05/30/2020) gemcitabine (GEMZAR) 2,500 mg in sodium chloride 0.9 % 250 mL chemo infusion, 2,584 mg, Intravenous,  Once, 2 of 4 cycles Administration: 2,500 mg (05/09/2020), 2,500 mg (05/16/2020), 2,500 mg (05/30/2020), 2,500 mg (06/06/2020) fosaprepitant (EMEND) 150 mg in sodium chloride 0.9 % 145 mL IVPB, 150 mg, Intravenous,  Once, 2 of 4 cycles Administration: 150 mg (05/09/2020), 150 mg (05/30/2020)  for chemotherapy treatment.    08/27/2020 -  Chemotherapy   The patient had dexamethasone (DECADRON) 4 MG tablet, 8 mg, Oral, Daily, 0 of 1 cycle, Start date: --, End date: -- palonosetron (ALOXI) injection 0.25 mg, 0.25 mg, Intravenous,  Once, 0 of 6 cycles pegfilgrastim-cbqv (UDENYCA) injection 6 mg, 6 mg, Subcutaneous, Once, 0 of 6 cycles bendamustine (BENDEKA) 250 mg in sodium chloride 0.9 % 50 mL (4.1667 mg/mL) chemo infusion, 90 mg/m2, Intravenous,  Once, 0 of 6 cycles riTUXimab-pvvr (RUXIENCE) 1,000 mg in sodium chloride 0.9 % 250 mL (2.8571 mg/mL) infusion, 375 mg/m2 = 1,000 mg, Intravenous,  Once, 0 of 6 cycles polatuzumab vedotin-piiq (POLIVY) 246 mg in sodium chloride 0.9 % 100 mL (2.1906 mg/mL) chemo infusion, 1.8 mg/kg, Intravenous,  Once, 0  of 6 cycles  for chemotherapy treatment.      HISTORY OF PRESENTING ILLNESS:  KWINTON MAAHS 69 y.o.  male relapsed diffuse large B-cell lymphoma currently s/p 2 cycles of GDP chemotherapy-with progressive disease is here for follow-up.  In the interim patient, was evaluated at [facility] with a PET scan that showed progressive disease in the neck left more than right mediastinum abdomen pelvis and also spleen activity.  Patient had correction of his stem cells for upcoming CART therapy on 10/13.  Patient is eager to proceed with bridging therapy prior to infusion of CART cells.  Otherwise no nausea no vomiting.  No difficulty swallowing no weight loss.  No difficulty breathing.  Review of Systems  Constitutional: Negative for chills, diaphoresis and fever.  HENT: Positive for hearing loss. Negative for nosebleeds and sore throat.   Eyes: Negative for double vision.  Respiratory: Negative for hemoptysis, sputum production and wheezing.   Cardiovascular: Negative for chest pain, palpitations, orthopnea and leg swelling.  Gastrointestinal: Negative for abdominal pain, blood in stool, constipation, diarrhea, heartburn, melena, nausea and vomiting.  Genitourinary: Negative for dysuria, frequency and urgency.  Musculoskeletal: Negative for back pain and joint pain.  Skin: Negative.  Negative for itching and rash.  Neurological: Negative for dizziness, tingling, focal weakness, weakness and headaches.  Endo/Heme/Allergies: Does not bruise/bleed easily.  Psychiatric/Behavioral: Negative for depression. The patient does not have insomnia.      MEDICAL HISTORY:  Past Medical History:  Diagnosis Date  . Adopted person   . Colon polyps   . Pancreatitis   .  Rectal bleeding 02/24/14  . Sepsis ([place]) 09/18/2019    SURGICAL HISTORY: Past Surgical History:  Procedure Laterality Date  . CHOLECYSTECTOMY    . COLONOSCOPY  02/28/14  . IR IMAGING GUIDED PORT INSERTION  07/18/2019  . LYMPH NODE BIOPSY  Left 03/28/2020   Procedure: EXCISION OF SUBMANDIBULAR  LYMPH NODE;  Surgeon: Carloyn Manner, MD;  Location: [place];  Service: ENT;  Laterality: Left;  . POLYPECTOMY     colon polyp removed  . TONSILLECTOMY      SOCIAL HISTORY: Social History   Socioeconomic History  . Marital status: Married    Spouse name: Not on file  . Number of children: Not on file  . Years of education: Not on file  . Highest education level: Not on file  Occupational History    Comment: Retired from [employer]  . Smoking status: Current Every Day Smoker    Packs/day: 1.00    Years: 20.00    Pack years: 20.00    Types: Cigars  . Smokeless tobacco: Never Used  . Tobacco comment: smoke 1/2 ppd cigaretes since 69 yo. changed to cigars at age July 2004.   Vaping Use  . Vaping Use: Never used  Substance and Sexual Activity  . Alcohol use: Yes    Comment: 6-7 drinks weekly  . Drug use: No  . Sexual activity: Not on file  Other Topics Concern  . Not on file  Social History Narrative   Lives at home with wife; in [place]. retd from [employer] smoker; drinks liquor. Children - grown up.    Social Determinants of Health   Financial Resource Strain:   . Difficulty of Paying Living Expenses: Not on file  Food Insecurity:   . Worried About Charity fundraiser in the Last Year: Not on file  . Ran Out of Food in the Last Year: Not on file  Transportation Needs:   . Lack of Transportation (Medical): Not on file  . Lack of Transportation (Non-Medical): Not on file  Physical Activity:   . Days of Exercise per Week: Not on file  . Minutes of Exercise per Session: Not on file  Stress:   . Feeling of Stress : Not on file  Social Connections:   . Frequency of Communication with Friends and Family: Not on file  . Frequency of Social Gatherings with Friends and Family: Not on file  . Attends Religious Services: Not on file  . Active Member of Clubs or Organizations: Not on file   . Attends Archivist Meetings: Not on file  . Marital Status: Not on file  Intimate Partner Violence:   . Fear of Current or Ex-Partner: Not on file  . Emotionally Abused: Not on file  . Physically Abused: Not on file  . Sexually Abused: Not on file    FAMILY HISTORY: Family History  Adopted: Yes    ALLERGIES:  is allergic to allopurinol, azithromycin, and losartan potassium.  MEDICATIONS:  Current Outpatient Medications  Medication Sig Dispense Refill  . carvedilol (COREG) 6.25 MG tablet Take 1 tablet (6.25 mg total) by mouth 2 (two) times daily. 180 tablet 3  . febuxostat (ULORIC) 40 MG tablet Take 3 tablets by mouth daily.    Marland Kitchen lidocaine-prilocaine (EMLA) cream Apply generously on the port 30-45 prior to accessing the port. 30 g 1  . Multiple Vitamins-Minerals (MULTI-VITAMIN/MINERALS PO) Take 1 capsule by mouth.    . predniSONE (DELTASONE) 50 MG tablet Take 2  tablets by mouth daily.    . rivaroxaban (XARELTO) 10 MG TABS tablet Take 1 tablet (10 mg total) by mouth daily. 90 tablet 3  . UNABLE TO FIND Med Name: CBD oil    . acyclovir (ZOVIRAX) 400 MG tablet Take 1 tablet (400 mg total) by mouth 2 (two) times daily. 60 tablet 3  . ondansetron (ZOFRAN) 4 MG tablet Take 1 tablet (4 mg total) by mouth every 8 (eight) hours as needed for up to 10 doses for nausea or vomiting. (Patient not taking: Reported on 05/09/2020) 20 tablet 0  . sulfamethoxazole-trimethoprim (BACTRIM DS) 800-160 MG tablet Take 1 tablet by mouth 2 (two) times daily. Take it on Monday ;wed; Friday 30 tablet 1   No current facility-administered medications for this visit.      Marland Kitchen  PHYSICAL EXAMINATION: ECOG PERFORMANCE STATUS: 1 - Symptomatic but completely ambulatory  Vitals:   08/15/20 1015  BP: 123/78  Pulse: 88  Resp: 18  Temp: (!) 97.3 F (36.3 C)   Filed Weights   08/15/20 1011  Weight: (!) 302 lb (137 kg)    Physical Exam HENT:     Head: Normocephalic and atraumatic.      Mouth/Throat:     Pharynx: No oropharyngeal exudate.  Eyes:     Pupils: Pupils are equal, round, and reactive to light.  Neck:     Comments: Approximately 2 to 3 cm x 2 lymph nodes felt in the left submandibular region Cardiovascular:     Rate and Rhythm: Normal rate and regular rhythm.  Pulmonary:     Effort: Pulmonary effort is normal. No respiratory distress.     Breath sounds: Normal breath sounds. No wheezing.  Abdominal:     General: Bowel sounds are normal. There is no distension.     Palpations: Abdomen is soft. There is no mass.     Tenderness: There is no abdominal tenderness. There is no guarding or rebound.  Musculoskeletal:        General: No tenderness. Normal range of motion.     Cervical back: Normal range of motion and neck supple.  Skin:    General: Skin is warm.  Neurological:     Mental Status: He is alert and oriented to person, place, and time.  Psychiatric:        Mood and Affect: Affect normal.    LABORATORY DATA:  I have reviewed the data as listed Lab Results  Component Value Date   WBC 3.7 (L) 08/15/2020   HGB 9.4 (L) 08/15/2020   HCT 27.8 (L) 08/15/2020   MCV 96.9 08/15/2020   PLT 128 (L) 08/15/2020   Recent Labs    05/30/20 0830 05/30/20 0830 06/06/20 0859 06/23/20 0933 08/15/20 0937  NA 138   < > 139 140 142  K 3.8   < > 4.0 3.8 3.8  CL 103   < > 101 105 104  CO2 26   < > $R'28 27 28  'Zi$ GLUCOSE 136*   < > 131* 119* 120*  BUN 17   < > $R'23 13 19  'mA$ CREATININE 0.86   < > 0.80 0.92 0.91  CALCIUM 8.8*   < > 8.9 8.7* 8.7*  GFRNONAA >60   < > >60 >60 >60  GFRAA >60  --  >60 >60  --   PROT 6.3*   < > 6.5 6.4* 6.3*  ALBUMIN 4.0   < > 4.0 4.1 4.1  AST 22   < > 26 28 32  ALT 28   < > 60* 41 25  ALKPHOS 76   < > 80 69 68  BILITOT 1.0   < > 1.1 0.8 2.1*   < > = values in this interval not displayed.    RADIOGRAPHIC STUDIES: I have personally reviewed the radiological images as listed and agreed with the findings in the report. No results  found.  ASSESSMENT & PLAN:   Lymphoma of lymph nodes ([place]) # Chemo refractory/ Relapsed diffuse large B-cell lymphoma-ABC subtype; IV [spleen]; S/p 2 cycles of gemcitabine-carboplatin-Dex- s/p 2 cycles-  OCT 12th 2021- [facility]- -New extensive cervical, supraclavicular, intrathoracic, axillary and abdominal and pelvic lymph nodes, worrisome for lymphoma involvement. New splenomegaly with new, diffuse uptake, now more intense than the liver, concerning for lymphoma involvement.  #Patient s/p evaluation at [facility] for consideration of car T-cell therapy.  Patient had pheresis/collection of stem cells on 10/13.  However CAR-T infusion will at least take couple weeks.  #Given the couple weeks delay with CAR-T infusion; in the context of progressive systemic disease-we will recommend bridging therapy with POLATUZUMAB and bendamustine.  Discussed the potential side effects including but not limited to risk of infections fatigue; low blood counts etc.  Would recommend growth after support; and also antimicrobial [shingles; PCP] prophylaxis with acyclovir twice daily; Bactrim.  Patient agreement to proceed with chemotherapy;   # Bil acute PE- [nov 2020]-currently on xarelto 10 mg/day/cardiology-STABLE  # Borderline LOW EF of 45-50%; Sep Echo 50-55% [Dr.Gollan]- on coreg BID-STABLE.    # DISPOSITION:  # Please start ASAP-Rituxan- polatuzumab; Benda- days-1 &2; D-3- Fulphila  # follow up TBD--Dr.B  Addendum: After patient left the clinic, I was informed by Dr. Thera Flake at [facility] the patient prefers to proceed with chemotherapy at [facility].  Nira Conn, RN left a voicemail for the patient to confirm that he is planning to proceed with treatment at [facility].  We will hold off scheduling chemotherapy here.  # 40 minutes face-to-face with the patient discussing the above plan of care; more than 50% of time spent on prognosis/ natural history; counseling and coordination.    All questions were answered. The patient knows to call the  clinic with any problems, questions or concerns.    Cammie Sickle, MD 08/17/2020 9:12 AM

## 2020-08-15 NOTE — Assessment & Plan Note (Addendum)
#   Chemo refractory/ Relapsed diffuse large B-cell lymphoma-ABC subtype; IV [spleen]; S/p 2 cycles of gemcitabine-carboplatin-Dex- s/p 2 cycles-  OCT 12th 2021- [facility]- -New extensive cervical, supraclavicular, intrathoracic, axillary and abdominal and pelvic lymph nodes, worrisome for lymphoma involvement. New splenomegaly with new, diffuse uptake, now more intense than the liver, concerning for lymphoma involvement.  #Patient s/p evaluation at [facility] for consideration of car T-cell therapy.  Patient had pheresis/collection of stem cells on 10/13.  However CAR-T infusion will at least take couple weeks.  #Given the couple weeks delay with CAR-T infusion; in the context of progressive systemic disease-we will recommend bridging therapy with POLATUZUMAB and bendamustine.  Discussed the potential side effects including but not limited to risk of infections fatigue; low blood counts etc.  Would recommend growth after support; and also antimicrobial [shingles; PCP] prophylaxis with acyclovir twice daily; Bactrim.  Patient agreement to proceed with chemotherapy;   # Bil acute PE- [nov 2020]-currently on xarelto 10 mg/day/cardiology-STABLE  # Borderline LOW EF of 45-50%; Sep Echo 50-55% [Dr.Gollan]- on coreg BID-STABLE.    # DISPOSITION:  # Please start ASAP-Rituxan- polatuzumab; Benda- days-1 &2; D-3- Fulphila  # follow up TBD--Dr.B  Addendum: After patient left the clinic, I was informed by Dr. Thera Flake at [facility] the patient prefers to proceed with chemotherapy at [facility].  Nira Conn, RN left a voicemail for the patient to confirm that he is planning to proceed with treatment at [facility].  We will hold off scheduling chemotherapy here.  # 40 minutes face-to-face with the patient discussing the above plan of care; more than 50% of time spent on prognosis/ natural history; counseling and coordination.

## 2020-08-15 NOTE — Progress Notes (Signed)
DISCONTINUE OFF PATHWAY REGIMEN - Lymphoma and CLL   [id number]:GDP (gemcitabine + dexamethasone + cisplatin) q21 Days:   A cycle is every 21 days:     Dexamethasone      Gemcitabine      Cisplatin   **Always confirm dose/schedule in your pharmacy ordering system**  REASON: Disease Progression PRIOR TREATMENT: Off Pathway: GDP (gemcitabine + dexamethasone + cisplatin) q21 Days TREATMENT RESPONSE: Progressive Disease (PD)  START ON PATHWAY REGIMEN - Lymphoma and CLL     A cycle is every 21 days:     Rituximab-xxxx      Polatuzumab vedotin-piiq      Bendamustine   **Always confirm dose/schedule in your pharmacy ordering system**  Patient Characteristics: Diffuse Large B-Cell Lymphoma or Follicular Lymphoma, Grade 3B, Relapsed / Refractory, All Stages, Third Line and Beyond, Not a Candidate for CAR T-Cell Therapy Disease Type: Not Applicable Disease Type: Diffuse Large B-Cell Lymphoma Disease Type: Not Applicable Line of therapy: Relapsed / Refractory - Third Line and Beyond Patient Characteristics: Not a Candidate for CAR T-Cell Therapy Intent of Therapy: Curative Intent, Discussed with Patient

## 2020-08-15 NOTE — Telephone Encounter (Signed)
Left vm for patient. Dr. Rogue Bussing received a message from Dr. Thera Flake at 1612 this afternoon. [facility] is able to get patient on the treatment schedule sooner on Monday. Left vm for patient to confirm that he would be cnl the apts on Wednesday.

## 2020-08-18 DIAGNOSIS — Z5112 Encounter for antineoplastic immunotherapy: Secondary | ICD-10-CM | POA: Diagnosis not present

## 2020-08-18 DIAGNOSIS — Z5111 Encounter for antineoplastic chemotherapy: Secondary | ICD-10-CM | POA: Diagnosis not present

## 2020-08-18 DIAGNOSIS — C8331 Diffuse large B-cell lymphoma, lymph nodes of head, face, and neck: Secondary | ICD-10-CM | POA: Diagnosis not present

## 2020-08-18 NOTE — Telephone Encounter (Signed)
Reviewed chart - patient is currently at [facility] for his chemotherpay. Apts cnl per previous discussion with Dr. Rogue Bussing.

## 2020-08-19 DIAGNOSIS — C8331 Diffuse large B-cell lymphoma, lymph nodes of head, face, and neck: Secondary | ICD-10-CM | POA: Diagnosis not present

## 2020-08-19 DIAGNOSIS — Z5111 Encounter for antineoplastic chemotherapy: Secondary | ICD-10-CM | POA: Diagnosis not present

## 2020-08-27 ENCOUNTER — Ambulatory Visit: Payer: Medicare Other

## 2020-08-28 ENCOUNTER — Ambulatory Visit: Payer: Medicare Other

## 2020-08-29 ENCOUNTER — Ambulatory Visit: Payer: Medicare Other

## 2020-09-03 DIAGNOSIS — C8331 Diffuse large B-cell lymphoma, lymph nodes of head, face, and neck: Secondary | ICD-10-CM | POA: Diagnosis not present

## 2020-09-03 DIAGNOSIS — Z86711 Personal history of pulmonary embolism: Secondary | ICD-10-CM | POA: Diagnosis not present

## 2020-09-03 DIAGNOSIS — F17290 Nicotine dependence, other tobacco product, uncomplicated: Secondary | ICD-10-CM | POA: Diagnosis not present

## 2020-09-03 DIAGNOSIS — Z9221 Personal history of antineoplastic chemotherapy: Secondary | ICD-10-CM | POA: Diagnosis not present

## 2020-09-03 DIAGNOSIS — Z79899 Other long term (current) drug therapy: Secondary | ICD-10-CM | POA: Diagnosis not present

## 2020-09-03 DIAGNOSIS — Z7901 Long term (current) use of anticoagulants: Secondary | ICD-10-CM | POA: Diagnosis not present

## 2020-09-03 DIAGNOSIS — C8338 Diffuse large B-cell lymphoma, lymph nodes of multiple sites: Secondary | ICD-10-CM | POA: Diagnosis not present

## 2020-09-03 DIAGNOSIS — R161 Splenomegaly, not elsewhere classified: Secondary | ICD-10-CM | POA: Diagnosis not present

## 2020-09-03 DIAGNOSIS — Z7289 Other problems related to lifestyle: Secondary | ICD-10-CM | POA: Diagnosis not present

## 2020-09-09 DIAGNOSIS — Z01812 Encounter for preprocedural laboratory examination: Secondary | ICD-10-CM | POA: Diagnosis not present

## 2020-09-09 DIAGNOSIS — C8331 Diffuse large B-cell lymphoma, lymph nodes of head, face, and neck: Secondary | ICD-10-CM | POA: Diagnosis not present

## 2020-09-10 ENCOUNTER — Other Ambulatory Visit: Payer: Self-pay | Admitting: Cardiovascular Disease

## 2020-09-11 DIAGNOSIS — C8330 Diffuse large B-cell lymphoma, unspecified site: Secondary | ICD-10-CM | POA: Diagnosis not present

## 2020-09-11 DIAGNOSIS — Z86711 Personal history of pulmonary embolism: Secondary | ICD-10-CM | POA: Diagnosis not present

## 2020-09-11 DIAGNOSIS — Z7901 Long term (current) use of anticoagulants: Secondary | ICD-10-CM | POA: Diagnosis not present

## 2020-09-11 DIAGNOSIS — Z5111 Encounter for antineoplastic chemotherapy: Secondary | ICD-10-CM | POA: Diagnosis not present

## 2020-09-12 DIAGNOSIS — Z5111 Encounter for antineoplastic chemotherapy: Secondary | ICD-10-CM | POA: Diagnosis not present

## 2020-09-12 DIAGNOSIS — C8331 Diffuse large B-cell lymphoma, lymph nodes of head, face, and neck: Secondary | ICD-10-CM | POA: Diagnosis not present

## 2020-09-13 DIAGNOSIS — Z5111 Encounter for antineoplastic chemotherapy: Secondary | ICD-10-CM | POA: Diagnosis not present

## 2020-09-13 DIAGNOSIS — C8331 Diffuse large B-cell lymphoma, lymph nodes of head, face, and neck: Secondary | ICD-10-CM | POA: Diagnosis not present

## 2020-09-16 DIAGNOSIS — Z86711 Personal history of pulmonary embolism: Secondary | ICD-10-CM | POA: Diagnosis not present

## 2020-09-16 DIAGNOSIS — R5081 Fever presenting with conditions classified elsewhere: Secondary | ICD-10-CM | POA: Diagnosis not present

## 2020-09-16 DIAGNOSIS — I959 Hypotension, unspecified: Secondary | ICD-10-CM | POA: Diagnosis not present

## 2020-09-16 DIAGNOSIS — Z9221 Personal history of antineoplastic chemotherapy: Secondary | ICD-10-CM | POA: Diagnosis not present

## 2020-09-16 DIAGNOSIS — R7881 Bacteremia: Secondary | ICD-10-CM | POA: Diagnosis not present

## 2020-09-16 DIAGNOSIS — A408 Other streptococcal sepsis: Secondary | ICD-10-CM | POA: Diagnosis not present

## 2020-09-16 DIAGNOSIS — D89839 Cytokine release syndrome, grade unspecified: Secondary | ICD-10-CM | POA: Diagnosis not present

## 2020-09-16 DIAGNOSIS — R410 Disorientation, unspecified: Secondary | ICD-10-CM | POA: Diagnosis not present

## 2020-09-16 DIAGNOSIS — C8330 Diffuse large B-cell lymphoma, unspecified site: Secondary | ICD-10-CM | POA: Diagnosis not present

## 2020-09-16 DIAGNOSIS — N179 Acute kidney failure, unspecified: Secondary | ICD-10-CM | POA: Diagnosis not present

## 2020-09-16 DIAGNOSIS — G9202 Immune effector cell-associated neurotoxicity syndrome, grade 2: Secondary | ICD-10-CM | POA: Diagnosis not present

## 2020-09-16 DIAGNOSIS — R509 Fever, unspecified: Secondary | ICD-10-CM | POA: Diagnosis not present

## 2020-09-16 DIAGNOSIS — T8082XA Complication of immune effector cellular therapy, initial encounter: Secondary | ICD-10-CM | POA: Diagnosis not present

## 2020-09-16 DIAGNOSIS — R578 Other shock: Secondary | ICD-10-CM | POA: Diagnosis not present

## 2020-09-16 DIAGNOSIS — D688 Other specified coagulation defects: Secondary | ICD-10-CM | POA: Diagnosis not present

## 2020-09-16 DIAGNOSIS — R5383 Other fatigue: Secondary | ICD-10-CM | POA: Diagnosis not present

## 2020-09-16 DIAGNOSIS — R6520 Severe sepsis without septic shock: Secondary | ICD-10-CM | POA: Diagnosis not present

## 2020-09-16 DIAGNOSIS — D89833 Cytokine release syndrome, grade 3: Secondary | ICD-10-CM | POA: Diagnosis not present

## 2020-09-16 DIAGNOSIS — D849 Immunodeficiency, unspecified: Secondary | ICD-10-CM | POA: Diagnosis not present

## 2020-09-16 DIAGNOSIS — Z5112 Encounter for antineoplastic immunotherapy: Secondary | ICD-10-CM | POA: Diagnosis not present

## 2020-09-16 DIAGNOSIS — C8331 Diffuse large B-cell lymphoma, lymph nodes of head, face, and neck: Secondary | ICD-10-CM | POA: Diagnosis not present

## 2020-09-16 DIAGNOSIS — I340 Nonrheumatic mitral (valve) insufficiency: Secondary | ICD-10-CM | POA: Diagnosis not present

## 2020-09-16 DIAGNOSIS — G9341 Metabolic encephalopathy: Secondary | ICD-10-CM | POA: Diagnosis not present

## 2020-09-16 DIAGNOSIS — Z7901 Long term (current) use of anticoagulants: Secondary | ICD-10-CM | POA: Diagnosis not present

## 2020-09-16 DIAGNOSIS — G253 Myoclonus: Secondary | ICD-10-CM | POA: Diagnosis not present

## 2020-09-16 DIAGNOSIS — C8338 Diffuse large B-cell lymphoma, lymph nodes of multiple sites: Secondary | ICD-10-CM | POA: Diagnosis not present

## 2020-09-16 DIAGNOSIS — D61818 Other pancytopenia: Secondary | ICD-10-CM | POA: Diagnosis not present

## 2020-09-16 DIAGNOSIS — B954 Other streptococcus as the cause of diseases classified elsewhere: Secondary | ICD-10-CM | POA: Diagnosis not present

## 2020-09-16 DIAGNOSIS — F17200 Nicotine dependence, unspecified, uncomplicated: Secondary | ICD-10-CM | POA: Diagnosis not present

## 2020-09-16 DIAGNOSIS — Z5111 Encounter for antineoplastic chemotherapy: Secondary | ICD-10-CM | POA: Diagnosis not present

## 2020-09-16 DIAGNOSIS — J9811 Atelectasis: Secondary | ICD-10-CM | POA: Diagnosis not present

## 2020-09-16 DIAGNOSIS — I517 Cardiomegaly: Secondary | ICD-10-CM | POA: Diagnosis not present

## 2020-09-16 DIAGNOSIS — R251 Tremor, unspecified: Secondary | ICD-10-CM | POA: Diagnosis not present

## 2020-09-16 DIAGNOSIS — D709 Neutropenia, unspecified: Secondary | ICD-10-CM | POA: Diagnosis not present

## 2020-09-16 DIAGNOSIS — R569 Unspecified convulsions: Secondary | ICD-10-CM | POA: Diagnosis not present

## 2020-09-16 DIAGNOSIS — R579 Shock, unspecified: Secondary | ICD-10-CM | POA: Diagnosis not present

## 2020-09-16 DIAGNOSIS — R6521 Severe sepsis with septic shock: Secondary | ICD-10-CM | POA: Diagnosis not present

## 2020-10-06 DIAGNOSIS — C8330 Diffuse large B-cell lymphoma, unspecified site: Secondary | ICD-10-CM | POA: Diagnosis not present

## 2020-10-06 DIAGNOSIS — R509 Fever, unspecified: Secondary | ICD-10-CM | POA: Diagnosis not present

## 2020-10-06 DIAGNOSIS — Z86711 Personal history of pulmonary embolism: Secondary | ICD-10-CM | POA: Diagnosis not present

## 2020-10-06 DIAGNOSIS — Z9481 Bone marrow transplant status: Secondary | ICD-10-CM | POA: Diagnosis not present

## 2020-10-06 DIAGNOSIS — C8331 Diffuse large B-cell lymphoma, lymph nodes of head, face, and neck: Secondary | ICD-10-CM | POA: Diagnosis not present

## 2020-10-06 DIAGNOSIS — Z7901 Long term (current) use of anticoagulants: Secondary | ICD-10-CM | POA: Diagnosis not present

## 2020-10-06 DIAGNOSIS — Z9221 Personal history of antineoplastic chemotherapy: Secondary | ICD-10-CM | POA: Diagnosis not present

## 2020-10-06 DIAGNOSIS — D89839 Cytokine release syndrome, grade unspecified: Secondary | ICD-10-CM | POA: Diagnosis not present

## 2020-10-06 DIAGNOSIS — D688 Other specified coagulation defects: Secondary | ICD-10-CM | POA: Diagnosis not present

## 2020-10-06 DIAGNOSIS — Z92850 Personal history of chimeric antigen receptor t-cell therapy: Secondary | ICD-10-CM | POA: Diagnosis not present

## 2020-10-09 DIAGNOSIS — C8331 Diffuse large B-cell lymphoma, lymph nodes of head, face, and neck: Secondary | ICD-10-CM | POA: Diagnosis not present

## 2020-10-14 DIAGNOSIS — D688 Other specified coagulation defects: Secondary | ICD-10-CM | POA: Diagnosis not present

## 2020-10-14 DIAGNOSIS — Z86711 Personal history of pulmonary embolism: Secondary | ICD-10-CM | POA: Diagnosis not present

## 2020-10-14 DIAGNOSIS — Z9481 Bone marrow transplant status: Secondary | ICD-10-CM | POA: Diagnosis not present

## 2020-10-14 DIAGNOSIS — Z9221 Personal history of antineoplastic chemotherapy: Secondary | ICD-10-CM | POA: Diagnosis not present

## 2020-10-14 DIAGNOSIS — R7401 Elevation of levels of liver transaminase levels: Secondary | ICD-10-CM | POA: Diagnosis not present

## 2020-10-14 DIAGNOSIS — D61818 Other pancytopenia: Secondary | ICD-10-CM | POA: Diagnosis not present

## 2020-10-14 DIAGNOSIS — Z7901 Long term (current) use of anticoagulants: Secondary | ICD-10-CM | POA: Diagnosis not present

## 2020-10-14 DIAGNOSIS — Z92850 Personal history of chimeric antigen receptor t-cell therapy: Secondary | ICD-10-CM | POA: Diagnosis not present

## 2020-10-14 DIAGNOSIS — D849 Immunodeficiency, unspecified: Secondary | ICD-10-CM | POA: Diagnosis not present

## 2020-10-14 DIAGNOSIS — C8331 Diffuse large B-cell lymphoma, lymph nodes of head, face, and neck: Secondary | ICD-10-CM | POA: Diagnosis not present

## 2020-10-14 DIAGNOSIS — C8330 Diffuse large B-cell lymphoma, unspecified site: Secondary | ICD-10-CM | POA: Diagnosis not present

## 2020-10-14 DIAGNOSIS — R197 Diarrhea, unspecified: Secondary | ICD-10-CM | POA: Diagnosis not present

## 2020-10-16 DIAGNOSIS — R7401 Elevation of levels of liver transaminase levels: Secondary | ICD-10-CM | POA: Diagnosis not present

## 2020-10-16 DIAGNOSIS — Z92850 Personal history of chimeric antigen receptor t-cell therapy: Secondary | ICD-10-CM | POA: Diagnosis not present

## 2020-10-16 DIAGNOSIS — D61818 Other pancytopenia: Secondary | ICD-10-CM | POA: Diagnosis not present

## 2020-10-16 DIAGNOSIS — C8330 Diffuse large B-cell lymphoma, unspecified site: Secondary | ICD-10-CM | POA: Diagnosis not present

## 2020-10-20 DIAGNOSIS — C8331 Diffuse large B-cell lymphoma, lymph nodes of head, face, and neck: Secondary | ICD-10-CM | POA: Diagnosis not present

## 2020-10-20 DIAGNOSIS — D61818 Other pancytopenia: Secondary | ICD-10-CM | POA: Diagnosis not present

## 2020-10-20 IMAGING — PT NM PET TUM IMG INITIAL (PI) SKULL BASE T - THIGH
1 of 9 series · 1 of 25 positions shown · non-contrast
Comparison: CT neck 06/29/2019

CLINICAL DATA: Initial treatment strategy for lymphoma.

EXAM:
NUCLEAR MEDICINE PET SKULL BASE TO THIGH
TECHNIQUE: 14.5 mCi F-18 FDG was injected intravenously. Full-ring PET imaging
was performed from the skull base to thigh after the radiotracer. CT
data was obtained and used for attenuation correction and anatomic
localization.
Fasting blood glucose: 91 mg/dl

[Series 3: ct wb 5.0 b30f · axial · 5.0mm · 0.98mm/px · 1 of 329 slices shown]
[im 329/329  brain]
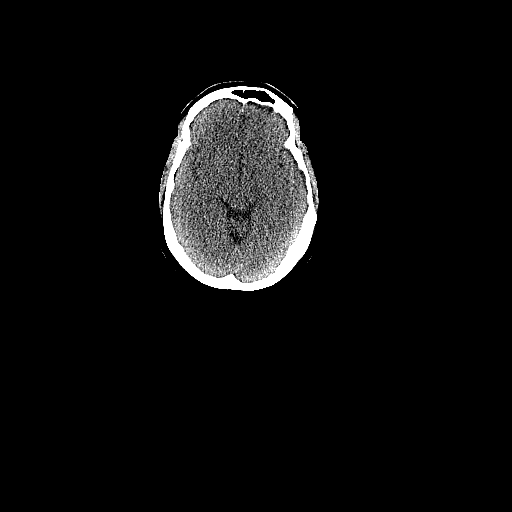

[1 of 25 positions shown; findings below may reference images not displayed]

FINDINGS: Mediastinal blood pool activity: SUV max

Liver activity: SUV max NA

NECK: Massively enlarged and intensely hypermetabolic bilateral
cervical lymph nodes involving the level II through level IV lymph
node stations as well as the level 1 nodes bilaterally. Example
massive lymph node complex in the LEFT level II nodal station
measures 3.7 cm short axis SUV max equal 25.1.

There is asymmetric soft tissue thickening in the posterior
oropharynx and hypopharynx which crosses midline but appears to
originate on the LEFT with SUV max equal 31.9. This mass encroaches
upon the oropharynx and measures 3.6 cm.

Incidental CT findings: Soft tissue mass in the posterior oropharynx
and hypopharynx as above.

CHEST: Intensely hypermetabolic mass of enlarged hypermetabolic LEFT
and RIGHT supraclavicular nodes. LEFT supraclavicular nodes with SUV
max equal 24 elevate the musculature.

Hypermetabolic adenopathy involves the bilateral mediastinal nodes
and bilateral axillary nodes.

Incidental CT findings: No pulmonary nodularity

ABDOMEN/PELVIS: Spleen is enlarged and uniformly hypermetabolic
measuring 18 cm in craniocaudad dimension with SUV max equal 7.1. No
abnormal activity liver. There are hypermetabolic periportal lymph
nodes. Minimal periaortic retroperitoneal nodes. There are small
lymph nodes along the iliac chains and operator node spaces.
Hypermetabolic lymph nodes in the inguinal nodal station. Minimally
enlarged nodes are in the LEFT inguinal space with SUV max equal
11.6 are mildly enlarged at 12 mm.

Testicles have normal metabolic activity.

Incidental CT findings: None

SKELETON: There is moderate diffuse metabolic activity within the
vertebral bodies of the thoracic lumbar spine with SUV max equal
7.1. Diffuse activity within the iliac bones and sacrum with SUV max
equal 5.2.

Incidental CT findings: none
IMPRESSION: 1. Bulky hypermetabolic lymphadenopathy involving the cervical lymph
nodes, mediastinal nodes, axillary nodes, periportal nodes and
inguinal nodes. Findings consistent with high-grade lymphoma.
Dominant adenopathy is within the neck and upper chest.
2. Bulky hypermetabolic mass in the posterior oropharynx and
hypopharynx.
3. Enlarged and uniform HYPERMETABOLIC SPLEEN consistent with
lymphoma involvement.
4. Diffuse moderately hypermetabolic activity marrow space.
Differential include lymphoma involvement of the marrow versus
marrow hyperplasia from anemia.

## 2020-10-27 DIAGNOSIS — R590 Localized enlarged lymph nodes: Secondary | ICD-10-CM | POA: Diagnosis not present

## 2020-10-27 DIAGNOSIS — C8330 Diffuse large B-cell lymphoma, unspecified site: Secondary | ICD-10-CM | POA: Diagnosis not present

## 2020-10-27 DIAGNOSIS — N200 Calculus of kidney: Secondary | ICD-10-CM | POA: Diagnosis not present

## 2020-10-27 DIAGNOSIS — R591 Generalized enlarged lymph nodes: Secondary | ICD-10-CM | POA: Diagnosis not present

## 2020-10-29 DIAGNOSIS — C8331 Diffuse large B-cell lymphoma, lymph nodes of head, face, and neck: Secondary | ICD-10-CM | POA: Diagnosis not present

## 2020-10-29 DIAGNOSIS — C8330 Diffuse large B-cell lymphoma, unspecified site: Secondary | ICD-10-CM | POA: Diagnosis not present

## 2020-10-29 DIAGNOSIS — C8338 Diffuse large B-cell lymphoma, lymph nodes of multiple sites: Secondary | ICD-10-CM | POA: Diagnosis not present

## 2020-11-02 ENCOUNTER — Encounter: Payer: Self-pay | Admitting: Internal Medicine

## 2020-11-05 ENCOUNTER — Encounter: Payer: Self-pay | Admitting: *Deleted

## 2020-11-05 DIAGNOSIS — C8330 Diffuse large B-cell lymphoma, unspecified site: Secondary | ICD-10-CM | POA: Diagnosis not present

## 2020-11-06 ENCOUNTER — Telehealth: Payer: Self-pay | Admitting: *Deleted

## 2020-11-06 NOTE — Telephone Encounter (Signed)
Spoke to Dr. Noralee Stain at [facility] with secure corespondence.  Patient needs lab/md (Dr. B) check next week if possible. Goal is to keep plts above 100K and anc above 1. Will need to order GSF if neutropenic. Patient will follow-up in 2 months with [facility] for another pet scna. Collaborating with  Dr. Noralee Stain is aware that pentamidine is not available at the [facility] cancer center. She will order Bactrim instead.  I personally contacted patient via phone call and mychart messages. Patient aware of the plan of care. He is agreeable to have an apt next Thursday at 9:15 am for lab and to see Dr. Donneta Romberg at 930 am 11/13/2020. He is doing well. He has not needed his GSF injection this week. He thanked me for updating him on the plan of care.

## 2020-11-13 ENCOUNTER — Inpatient Hospital Stay (HOSPITAL_BASED_OUTPATIENT_CLINIC_OR_DEPARTMENT_OTHER): Payer: Medicare Other | Admitting: Internal Medicine

## 2020-11-13 ENCOUNTER — Other Ambulatory Visit: Payer: Self-pay

## 2020-11-13 ENCOUNTER — Other Ambulatory Visit: Payer: Self-pay | Admitting: *Deleted

## 2020-11-13 ENCOUNTER — Inpatient Hospital Stay: Payer: Medicare Other | Attending: Internal Medicine

## 2020-11-13 DIAGNOSIS — C8590 Non-Hodgkin lymphoma, unspecified, unspecified site: Secondary | ICD-10-CM

## 2020-11-13 DIAGNOSIS — Z8572 Personal history of non-Hodgkin lymphomas: Secondary | ICD-10-CM | POA: Diagnosis not present

## 2020-11-13 LAB — CBC WITH DIFFERENTIAL/PLATELET
Abs Immature Granulocytes: 0.03 10*3/uL (ref 0.00–0.07)
Basophils Absolute: 0 10*3/uL (ref 0.0–0.1)
Basophils Relative: 2 %
Eosinophils Absolute: 0.2 10*3/uL (ref 0.0–0.5)
Eosinophils Relative: 6 %
HCT: 32.2 % — ABNORMAL LOW (ref 39.0–52.0)
Hemoglobin: 11.4 g/dL — ABNORMAL LOW (ref 13.0–17.0)
Immature Granulocytes: 1 %
Lymphocytes Relative: 13 %
Lymphs Abs: 0.3 10*3/uL — ABNORMAL LOW (ref 0.7–4.0)
MCH: 35.3 pg — ABNORMAL HIGH (ref 26.0–34.0)
MCHC: 35.4 g/dL (ref 30.0–36.0)
MCV: 99.7 fL (ref 80.0–100.0)
Monocytes Absolute: 0.5 10*3/uL (ref 0.1–1.0)
Monocytes Relative: 20 %
Neutro Abs: 1.6 10*3/uL — ABNORMAL LOW (ref 1.7–7.7)
Neutrophils Relative %: 58 %
Platelets: 137 10*3/uL — ABNORMAL LOW (ref 150–400)
RBC: 3.23 MIL/uL — ABNORMAL LOW (ref 4.22–5.81)
RDW: 18.8 % — ABNORMAL HIGH (ref 11.5–15.5)
WBC: 2.7 10*3/uL — ABNORMAL LOW (ref 4.0–10.5)
nRBC: 0 % (ref 0.0–0.2)

## 2020-11-13 LAB — COMPREHENSIVE METABOLIC PANEL
ALT: 31 U/L (ref 0–44)
AST: 28 U/L (ref 15–41)
Albumin: 4.3 g/dL (ref 3.5–5.0)
Alkaline Phosphatase: 49 U/L (ref 38–126)
Anion gap: 9 (ref 5–15)
BUN: 18 mg/dL (ref 8–23)
CO2: 26 mmol/L (ref 22–32)
Calcium: 9.2 mg/dL (ref 8.9–10.3)
Chloride: 105 mmol/L (ref 98–111)
Creatinine, Ser: 1.02 mg/dL (ref 0.61–1.24)
GFR, Estimated: >60 mL/min (ref >60)
Glucose, Bld: 92 mg/dL (ref 70–99)
Potassium: 3.9 mmol/L (ref 3.5–5.1)
Sodium: 140 mmol/L (ref 135–145)
Total Bilirubin: 1.2 mg/dL (ref 0.3–1.2)
Total Protein: 5.8 g/dL — ABNORMAL LOW (ref 6.5–8.1)

## 2020-11-13 LAB — SAMPLE TO BLOOD BANK

## 2020-11-13 LAB — MAGNESIUM: Magnesium: 1.9 mg/dL (ref 1.7–2.4)

## 2020-11-13 LAB — LACTATE DEHYDROGENASE: LDH: 195 U/L — ABNORMAL HIGH (ref 98–192)

## 2020-11-13 NOTE — Assessment & Plan Note (Addendum)
#   Chemo refractory/ Relapsed diffuse large B-cell lymphoma-ABC subtype; IV [spleen]; s/pNOV 2021- CART therapy.  December 27th-post CAR-T therapy PET scan-complete response.  Patient is awaiting repeat PET scan December 22, 2020.  I reviewed the records from [facility] in detail; summarized above.  # Bil acute PE- [nov 2020]-status post anticoagulation more than 1 year.  I think is reasonable to discontinue anticoagulation.  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  #IV access/Mediport-port flush at [facility]  # DISPOSITION:  # every 2 weeks x 6- labs- cbc/LDH/bmp. # follow up 3 months- MD; labs- cbc/cmp/ldh; port flush-Dr.B

## 2020-11-13 NOTE — Progress Notes (Signed)
[facility] NOTE  Patient Care Team: [name], MD as PCP - General (Family Medicine) [name], [name], MD (General Surgery) Cammie Sickle, MD as Consulting Physician (Internal Medicine)  CHIEF COMPLAINTS/PURPOSE OF CONSULTATION: Diffuse large B-cell lymphoma  #  Oncology History Overview Note  #June 29, 2019-CT- bulky bilateral neck adenopathy left more than right; September 2020-Diffuse B-cell lymphoma; ABC subtype; NEGATIVE FISH studies.  PET scan -bulky adenopathy involving neck /lymphadenopathy mediastinum /abdomen/pelvis inguinal lymph nodes; splenic involvement.  Question bone.  Stage IV [spleen involvement; no bone marrow biopsy].  BCL-2: Positive /BCL 6: Positive /MUM-1: Positive /CD10: Negative 'Ki-67: High proliferation index (90%) /CD30: Negative /Cyclin D1: Negative C-MYC: Equivocal (20% staining seen on limited and fragmented tissue) /EBER: Negative   # Ritux-Pred- 9/11; 9/18- R-COP [held adria- sec to EF 45-50%]  #May 2021-PET scan left neck recurrent disease; excisional biopsy [Dr. Vaught]-recurrent diffuse left B-cell lymphoma  #November 2020-bilateral PE; on xarelto  #J07/07/2020- 2021- Gem-CarboDex [II opinion [facility]; Dr. Thera Flake; stem cell transplant eval]; no Rituxan-repeat CD20 negative.   #S/p 2 cycles-gem carbo Dex-June 18, 2020 PET scan-slight increase in SUV uptake of the left cervical lymph nodes; no distant metastatic disease.  Discontinued chemotherapy; refer to CAR-T;  OCT 12th 2021- [facility]- -New extensive cervical, supraclavicular, intrathoracic, axillary and abdominal and pelvic lymph nodes, worrisome for lymphoma involvement. New splenomegaly with new, diffuse uptake, now more intense than the liver, concerning for lymphoma involvement. S/p POLA-BENDA  # CAR-T/Axi-cel on 09/16/20 preceded by fludarabine/cyclophosphamide lymphodepletion.[[facility]; Dr.Grover]; complicated by CRS-G-3 [ICU]; dec 27th-PET CR.   # Sep 2020-  Borderline low EF/Left ventricular hypokinesis [Dr.Gollan]-s October 9th stress test-nonischemic;   # DIAGNOSIS: DLBCL  STAGE:   4      ;GOALS: Cure  CURRENT/MOST RECENT THERAPY : R-COP/-R-CEOP. [C]    Lymphoma of lymph nodes ([place])  07/03/2019 Initial Diagnosis   Lymphoma of lymph nodes ([place])   07/13/2019 - 12/07/2019 Chemotherapy   The patient had palonosetron (ALOXI) injection 0.25 mg, 0.25 mg, Intravenous,  Once, 7 of 7 cycles Administration: 0.25 mg (07/20/2019), 0.25 mg (08/13/2019), 0.25 mg (11/13/2019), 0.25 mg (09/03/2019), 0.25 mg (10/02/2019), 0.25 mg (10/23/2019), 0.25 mg (12/04/2019) pegfilgrastim (NEULASTA ONPRO KIT) injection 6 mg, 6 mg, Subcutaneous, Once, 2 of 2 cycles Administration: 6 mg (10/04/2019), 6 mg (10/25/2019) pegfilgrastim-cbqv (UDENYCA) injection 6 mg, 6 mg, Subcutaneous, Once, 5 of 5 cycles Administration: 6 mg (07/23/2019), 6 mg (08/16/2019), 6 mg (09/06/2019), 6 mg (11/16/2019), 6 mg (12/07/2019) vinCRIStine (ONCOVIN) 2 mg in sodium chloride 0.9 % 50 mL chemo infusion, 2 mg, Intravenous,  Once, 7 of 7 cycles Administration: 2 mg (07/20/2019), 2 mg (08/13/2019), 2 mg (11/13/2019), 2 mg (09/03/2019), 2 mg (10/02/2019), 2 mg (10/23/2019), 2 mg (12/04/2019) cyclophosphamide (CYTOXAN) 1,900 mg in sodium chloride 0.9 % 250 mL chemo infusion, 750 mg/m2 = 1,900 mg, Intravenous,  Once, 7 of 7 cycles Administration: 1,900 mg (07/20/2019), 1,900 mg (08/13/2019), 2,000 mg (11/13/2019), 1,900 mg (09/03/2019), 2,000 mg (10/02/2019), 2,000 mg (10/23/2019), 2,000 mg (12/04/2019) etoposide (VEPESID) 250 mg in sodium chloride 0.9 % 1,000 mL chemo infusion, 100 mg/m2 = 250 mg, Intravenous,  Once, 6 of 6 cycles Dose modification: 50 mg/m2 (original dose 100 mg/m2, Cycle 3, Reason: Provider Judgment) Administration: 130 mg (08/14/2019), 130 mg (08/15/2019), 130 mg (09/03/2019), 130 mg (09/04/2019), 130 mg (09/05/2019), 130 mg (10/03/2019), 130 mg (10/04/2019), 130 mg (10/24/2019), 130 mg (10/25/2019), 130 mg  (11/13/2019), 130 mg (11/14/2019), 130 mg (11/15/2019), 130 mg (10/02/2019), 130 mg (10/23/2019),  130 mg (12/04/2019), 130 mg (12/05/2019), 130 mg (12/06/2019) dexamethasone (DECADRON) 12 mg in sodium chloride 0.9 % 145 mL IVPB, , Intravenous,  Once, 8 of 8 cycles Administration:  (07/13/2019),  (07/20/2019),  (08/13/2019),  (11/13/2019),  (09/03/2019),  (10/02/2019),  (10/23/2019),  (12/04/2019) riTUXimab-pvvr (RUXIENCE) 900 mg in sodium chloride 0.9 % 250 mL (2.6471 mg/mL) infusion, 375 mg/m2 = 900 mg (100 % of original dose 375 mg/m2), Intravenous,  Once, 1 of 1 cycle Dose modification: 375 mg/m2 (original dose 375 mg/m2, Cycle 1) Administration: 900 mg (07/13/2019)  for chemotherapy treatment.    05/09/2020 - 06/06/2020 Chemotherapy   The patient had palonosetron (ALOXI) injection 0.25 mg, 0.25 mg, Intravenous,  Once, 2 of 4 cycles Administration: 0.25 mg (05/09/2020), 0.25 mg (05/30/2020) CARBOplatin (PARAPLATIN) 750 mg in sodium chloride 0.9 % 250 mL chemo infusion, 750 mg (100 % of original dose 750 mg), Intravenous,  Once, 2 of 4 cycles Dose modification:   (original dose 750 mg, Cycle 1, Reason: Provider Judgment, Comment: labs from care every where. ) Administration: 750 mg (05/09/2020), 750 mg (05/30/2020) gemcitabine (GEMZAR) 2,500 mg in sodium chloride 0.9 % 250 mL chemo infusion, 2,584 mg, Intravenous,  Once, 2 of 4 cycles Administration: 2,500 mg (05/09/2020), 2,500 mg (05/16/2020), 2,500 mg (05/30/2020), 2,500 mg (06/06/2020) fosaprepitant (EMEND) 150 mg in sodium chloride 0.9 % 145 mL IVPB, 150 mg, Intravenous,  Once, 2 of 4 cycles Administration: 150 mg (05/09/2020), 150 mg (05/30/2020)  for chemotherapy treatment.    08/27/2020 -  Chemotherapy   The patient had dexamethasone (DECADRON) 4 MG tablet, 8 mg, Oral, Daily, 0 of 1 cycle, Start date: --, End date: -- palonosetron (ALOXI) injection 0.25 mg, 0.25 mg, Intravenous,  Once, 0 of 6 cycles pegfilgrastim-cbqv (UDENYCA) injection 6 mg, 6 mg, Subcutaneous, Once,  0 of 6 cycles bendamustine (BENDEKA) 250 mg in sodium chloride 0.9 % 50 mL (4.1667 mg/mL) chemo infusion, 90 mg/m2, Intravenous,  Once, 0 of 6 cycles riTUXimab-pvvr (RUXIENCE) 1,000 mg in sodium chloride 0.9 % 250 mL (2.8571 mg/mL) infusion, 375 mg/m2 = 1,000 mg, Intravenous,  Once, 0 of 6 cycles polatuzumab vedotin-piiq (POLIVY) 246 mg in sodium chloride 0.9 % 100 mL (2.1906 mg/mL) chemo infusion, 1.8 mg/kg, Intravenous,  Once, 0 of 6 cycles  for chemotherapy treatment.    11/13/2020 Cancer Staging   Staging form: Hodgkin and Non-Hodgkin Lymphoma, AJCC 8th Edition - Clinical: Stage IV (Diffuse large B-cell lymphoma) - Signed by Cammie Sickle, MD on 11/13/2020     HISTORY OF PRESENTING ILLNESS:  Eric [name] 70 y.o.  male relapsed/refractory diffuse large B-cell lymphoma currently s/p CART therapy at [facility] in November 2021 is here for follow-up.  Patient course was complicated by grade 3 CRS (treated with tocilizumab) and grade 2 ICANS (treated with steroids) requiring MICU stay with resolution of symptoms as well as strep mitis bacteremia for which he completed antibiotics.  Posttreatment PET scan showed complete response.  Overall patient feels good.  Denies any new swellings.  Denies any nausea vomiting abdominal pain.  No headaches.  No chest pain or shortness breath or cough.   Review of Systems  Constitutional: Negative for chills, diaphoresis and fever.  HENT: Positive for hearing loss. Negative for nosebleeds and sore throat.   Eyes: Negative for double vision.  Respiratory: Negative for hemoptysis, sputum production and wheezing.   Cardiovascular: Negative for chest pain, palpitations, orthopnea and leg swelling.  Gastrointestinal: Negative for abdominal pain, blood in stool, constipation, diarrhea, heartburn, melena, nausea and  vomiting.  Genitourinary: Negative for dysuria, frequency and urgency.  Musculoskeletal: Negative for back pain and joint pain.  Skin: Negative.   Negative for itching and rash.  Neurological: Negative for dizziness, tingling, focal weakness, weakness and headaches.  Endo/Heme/Allergies: Does not bruise/bleed easily.  Psychiatric/Behavioral: Negative for depression. The patient does not have insomnia.      MEDICAL HISTORY:  Past Medical History:  Diagnosis Date  . Adopted person   . Colon polyps   . Pancreatitis   . Rectal bleeding 02/24/14  . Sepsis ([place]) 09/18/2019    SURGICAL HISTORY: Past Surgical History:  Procedure Laterality Date  . CHOLECYSTECTOMY    . COLONOSCOPY  02/28/14  . IR IMAGING GUIDED PORT INSERTION  07/18/2019  . LYMPH NODE BIOPSY Left 03/28/2020   Procedure: EXCISION OF SUBMANDIBULAR  LYMPH NODE;  Surgeon: Carloyn Manner, MD;  Location: [facility];  Service: ENT;  Laterality: Left;  . POLYPECTOMY     colon polyp removed  . TONSILLECTOMY      SOCIAL HISTORY: Social History   Socioeconomic History  . Marital status: Married    Spouse name: Not on file  . Number of children: Not on file  . Years of education: Not on file  . Highest education level: Not on file  Occupational History    Comment: Retired from [employer]  . Smoking status: Current Every Day Smoker    Packs/day: 1.00    Years: 20.00    Pack years: 20.00    Types: Cigars  . Smokeless tobacco: Never Used  . Tobacco comment: smoke 1/2 ppd cigaretes since 70 yo. changed to cigars at age July 2004.   Vaping Use  . Vaping Use: Never used  Substance and Sexual Activity  . Alcohol use: Yes    Comment: 6-7 drinks weekly  . Drug use: No  . Sexual activity: Not on file  Other Topics Concern  . Not on file  Social History Narrative   Lives at home with wife; in [place]. retd from [employer] smoker; drinks liquor. Children - grown up.    Social Determinants of Health   Financial Resource Strain: Not on file  Food Insecurity: Not on file  Transportation Needs: Not on file  Physical Activity: Not on file   Stress: Not on file  Social Connections: Not on file  Intimate Partner Violence: Not on file    FAMILY HISTORY: Family History  Adopted: Yes    ALLERGIES:  is allergic to allopurinol, azithromycin, and losartan potassium.  MEDICATIONS:  Current Outpatient Medications  Medication Sig Dispense Refill  . acyclovir (ZOVIRAX) 400 MG tablet Take 1 tablet (400 mg total) by mouth 2 (two) times daily. 60 tablet 3  . carvedilol (COREG) 6.25 MG tablet TAKE ONE TABLET BY MOUTH TWICE DAILY 180 tablet 3  . lidocaine-prilocaine (EMLA) cream Apply generously on the port 30-45 prior to accessing the port. 30 g 1  . Multiple Vitamins-Minerals (MULTI-VITAMIN/MINERALS PO) Take 1 capsule by mouth.    . sulfamethoxazole-trimethoprim (BACTRIM DS) 800-160 MG tablet Take 1 tablet by mouth 2 (two) times daily. Take it on Monday ;wed; Friday 30 tablet 1  . febuxostat (ULORIC) 40 MG tablet Take 3 tablets by mouth daily. (Patient not taking: Reported on 11/13/2020)    . ondansetron (ZOFRAN) 4 MG tablet Take 1 tablet (4 mg total) by mouth every 8 (eight) hours as needed for up to 10 doses for nausea or vomiting. (Patient not taking: No sig reported) 20 tablet  0  . rivaroxaban (XARELTO) 10 MG TABS tablet Take 1 tablet (10 mg total) by mouth daily. (Patient not taking: Reported on 11/13/2020) 90 tablet 3  . UNABLE TO FIND Med Name: CBD oil     No current facility-administered medications for this visit.      Marland Kitchen  PHYSICAL EXAMINATION: ECOG PERFORMANCE STATUS: 1 - Symptomatic but completely ambulatory  Vitals:   11/13/20 1006  BP: 113/79  Pulse: 86  Resp: 17  Temp: 98.7 F (37.1 C)  SpO2: 100%   Filed Weights   11/13/20 1006  Weight: 264 lb (119.7 kg)    Physical Exam HENT:     Head: Normocephalic and atraumatic.     Mouth/Throat:     Pharynx: No oropharyngeal exudate.  Eyes:     Pupils: Pupils are equal, round, and reactive to light.  Cardiovascular:     Rate and Rhythm: Normal rate and regular  rhythm.  Pulmonary:     Effort: Pulmonary effort is normal. No respiratory distress.     Breath sounds: Normal breath sounds. No wheezing.  Abdominal:     General: Bowel sounds are normal. There is no distension.     Palpations: Abdomen is soft. There is no mass.     Tenderness: There is no abdominal tenderness. There is no guarding or rebound.  Musculoskeletal:        General: No tenderness. Normal range of motion.     Cervical back: Normal range of motion and neck supple.  Skin:    General: Skin is warm.  Neurological:     Mental Status: He is alert and oriented to person, place, and time.  Psychiatric:        Mood and Affect: Affect normal.    LABORATORY DATA:  I have reviewed the data as listed Lab Results  Component Value Date   WBC 2.7 (L) 11/13/2020   HGB 11.4 (L) 11/13/2020   HCT 32.2 (L) 11/13/2020   MCV 99.7 11/13/2020   PLT 137 (L) 11/13/2020   Recent Labs    05/30/20 0830 06/06/20 0859 06/23/20 0933 08/15/20 0937 11/13/20 0944  NA 138 139 140 142 140  K 3.8 4.0 3.8 3.8 3.9  CL 103 101 105 104 105  CO2 _0 GLUCOSE 136* 131* 119* 120* 92  BUN _1 CREATININE 0.86 0.80 0.92 0.91 1.02  CALCIUM 8.8* 8.9 8.7* 8.7* 9.2  GFRNONAA >60 >60 >60 >60 >60  GFRAA >60 >60 >60  --   --   PROT 6.3* 6.5 6.4* 6.3* 5.8*  ALBUMIN 4.0 4.0 4.1 4.1 4.3  AST _2 32 28  ALT 28 60* 41 25 31  ALKPHOS 76 80 69 68 49  BILITOT 1.0 1.1 0.8 2.1* 1.2    RADIOGRAPHIC STUDIES: I have personally reviewed the radiological images as listed and agreed with the findings in the report. No results found.  ASSESSMENT & PLAN:   Lymphoma of lymph nodes ([place]) # Chemo refractory/ Relapsed diffuse large B-cell lymphoma-ABC subtype; IV [spleen]; s/pNOV 2021- CART therapy.  December 27th-post CAR-T therapy PET scan-complete response.  Patient is awaiting repeat PET scan December 22, 2020.  I reviewed the records from [facility] in detail; summarized above.  # Bil acute PE-  [nov 2020]-status post anticoagulation more than 1 year.  I think is reasonable to discontinue anticoagulation.  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  #  IV access/Mediport-[facility]  # DISPOSITION:  # every 2 weeks x 6- labs- cbc/LDH/bmp. # follow up 3 months- MD; labs- cbc/cmp/ldh; port flush-Dr.B     All questions were answered. The patient knows to call the clinic with any problems, questions or concerns.    Cammie Sickle, MD 11/13/2020 1:29 PM

## 2020-11-19 DIAGNOSIS — C8330 Diffuse large B-cell lymphoma, unspecified site: Secondary | ICD-10-CM | POA: Diagnosis not present

## 2020-11-20 ENCOUNTER — Encounter: Payer: Self-pay | Admitting: Internal Medicine

## 2020-11-24 ENCOUNTER — Encounter: Payer: Self-pay | Admitting: Internal Medicine

## 2020-11-27 ENCOUNTER — Inpatient Hospital Stay: Payer: Medicare Other

## 2020-11-27 DIAGNOSIS — C8590 Non-Hodgkin lymphoma, unspecified, unspecified site: Secondary | ICD-10-CM

## 2020-11-27 DIAGNOSIS — Z8572 Personal history of non-Hodgkin lymphomas: Secondary | ICD-10-CM | POA: Diagnosis not present

## 2020-11-27 LAB — BASIC METABOLIC PANEL
Anion gap: 6 (ref 5–15)
BUN: 17 mg/dL (ref 8–23)
CO2: 29 mmol/L (ref 22–32)
Calcium: 9.1 mg/dL (ref 8.9–10.3)
Chloride: 105 mmol/L (ref 98–111)
Creatinine, Ser: 0.94 mg/dL (ref 0.61–1.24)
GFR, Estimated: >60 mL/min (ref >60)
Glucose, Bld: 89 mg/dL (ref 70–99)
Potassium: 4 mmol/L (ref 3.5–5.1)
Sodium: 140 mmol/L (ref 135–145)

## 2020-11-27 LAB — CBC WITH DIFFERENTIAL/PLATELET
Abs Immature Granulocytes: 0.02 10*3/uL (ref 0.00–0.07)
Basophils Absolute: 0 10*3/uL (ref 0.0–0.1)
Basophils Relative: 1 %
Eosinophils Absolute: 0.2 10*3/uL (ref 0.0–0.5)
Eosinophils Relative: 7 %
HCT: 33.1 % — ABNORMAL LOW (ref 39.0–52.0)
Hemoglobin: 12 g/dL — ABNORMAL LOW (ref 13.0–17.0)
Immature Granulocytes: 1 %
Lymphocytes Relative: 11 %
Lymphs Abs: 0.3 10*3/uL — ABNORMAL LOW (ref 0.7–4.0)
MCH: 36.7 pg — ABNORMAL HIGH (ref 26.0–34.0)
MCHC: 36.3 g/dL — ABNORMAL HIGH (ref 30.0–36.0)
MCV: 101.2 fL — ABNORMAL HIGH (ref 80.0–100.0)
Monocytes Absolute: 0.4 10*3/uL (ref 0.1–1.0)
Monocytes Relative: 14 %
Neutro Abs: 1.9 10*3/uL (ref 1.7–7.7)
Neutrophils Relative %: 66 %
Platelets: 119 10*3/uL — ABNORMAL LOW (ref 150–400)
RBC: 3.27 MIL/uL — ABNORMAL LOW (ref 4.22–5.81)
RDW: 16.8 % — ABNORMAL HIGH (ref 11.5–15.5)
WBC: 2.8 10*3/uL — ABNORMAL LOW (ref 4.0–10.5)
nRBC: 0 % (ref 0.0–0.2)

## 2020-11-27 LAB — LACTATE DEHYDROGENASE: LDH: 180 U/L (ref 98–192)

## 2020-12-11 ENCOUNTER — Inpatient Hospital Stay: Payer: Medicare Other

## 2020-12-11 ENCOUNTER — Inpatient Hospital Stay: Payer: Medicare Other | Attending: Internal Medicine

## 2020-12-11 DIAGNOSIS — Z8572 Personal history of non-Hodgkin lymphomas: Secondary | ICD-10-CM | POA: Diagnosis not present

## 2020-12-11 DIAGNOSIS — C8590 Non-Hodgkin lymphoma, unspecified, unspecified site: Secondary | ICD-10-CM

## 2020-12-11 LAB — BASIC METABOLIC PANEL
Anion gap: 9 (ref 5–15)
BUN: 22 mg/dL (ref 8–23)
CO2: 26 mmol/L (ref 22–32)
Calcium: 9 mg/dL (ref 8.9–10.3)
Chloride: 106 mmol/L (ref 98–111)
Creatinine, Ser: 0.87 mg/dL (ref 0.61–1.24)
GFR, Estimated: >60 mL/min (ref >60)
Glucose, Bld: 111 mg/dL — ABNORMAL HIGH (ref 70–99)
Potassium: 4.5 mmol/L (ref 3.5–5.1)
Sodium: 141 mmol/L (ref 135–145)

## 2020-12-11 LAB — CBC WITH DIFFERENTIAL/PLATELET
Abs Immature Granulocytes: 0.03 10*3/uL (ref 0.00–0.07)
Basophils Absolute: 0 10*3/uL (ref 0.0–0.1)
Basophils Relative: 0 %
Eosinophils Absolute: 0.2 10*3/uL (ref 0.0–0.5)
Eosinophils Relative: 3 %
HCT: 32.9 % — ABNORMAL LOW (ref 39.0–52.0)
Hemoglobin: 12 g/dL — ABNORMAL LOW (ref 13.0–17.0)
Immature Granulocytes: 1 %
Lymphocytes Relative: 5 %
Lymphs Abs: 0.3 10*3/uL — ABNORMAL LOW (ref 0.7–4.0)
MCH: 37.3 pg — ABNORMAL HIGH (ref 26.0–34.0)
MCHC: 36.5 g/dL — ABNORMAL HIGH (ref 30.0–36.0)
MCV: 102.2 fL — ABNORMAL HIGH (ref 80.0–100.0)
Monocytes Absolute: 0.4 10*3/uL (ref 0.1–1.0)
Monocytes Relative: 8 %
Neutro Abs: 4.4 10*3/uL (ref 1.7–7.7)
Neutrophils Relative %: 83 %
Platelets: 114 10*3/uL — ABNORMAL LOW (ref 150–400)
RBC: 3.22 MIL/uL — ABNORMAL LOW (ref 4.22–5.81)
RDW: 15.9 % — ABNORMAL HIGH (ref 11.5–15.5)
WBC: 5.3 10*3/uL (ref 4.0–10.5)
nRBC: 0 % (ref 0.0–0.2)

## 2020-12-11 LAB — LACTATE DEHYDROGENASE: LDH: 258 U/L — ABNORMAL HIGH (ref 98–192)

## 2020-12-12 ENCOUNTER — Telehealth: Payer: Self-pay | Admitting: Internal Medicine

## 2020-12-12 NOTE — Telephone Encounter (Signed)
On 2/10-I called and spoke to patient regarding the results of his blood work; LDH slightly elevated.  However continue monitor for now.  GB

## 2020-12-22 DIAGNOSIS — N200 Calculus of kidney: Secondary | ICD-10-CM | POA: Diagnosis not present

## 2020-12-22 DIAGNOSIS — C8338 Diffuse large B-cell lymphoma, lymph nodes of multiple sites: Secondary | ICD-10-CM | POA: Diagnosis not present

## 2020-12-22 DIAGNOSIS — R161 Splenomegaly, not elsewhere classified: Secondary | ICD-10-CM | POA: Diagnosis not present

## 2020-12-24 ENCOUNTER — Encounter: Payer: Self-pay | Admitting: Internal Medicine

## 2020-12-24 DIAGNOSIS — C8330 Diffuse large B-cell lymphoma, unspecified site: Secondary | ICD-10-CM | POA: Diagnosis not present

## 2020-12-24 DIAGNOSIS — C8338 Diffuse large B-cell lymphoma, lymph nodes of multiple sites: Secondary | ICD-10-CM | POA: Diagnosis not present

## 2020-12-25 ENCOUNTER — Inpatient Hospital Stay: Payer: Medicare Other

## 2021-01-08 ENCOUNTER — Inpatient Hospital Stay: Payer: Medicare Other

## 2021-01-08 ENCOUNTER — Inpatient Hospital Stay: Payer: Medicare Other | Attending: Internal Medicine

## 2021-01-08 DIAGNOSIS — Z452 Encounter for adjustment and management of vascular access device: Secondary | ICD-10-CM | POA: Diagnosis not present

## 2021-01-08 DIAGNOSIS — Z8572 Personal history of non-Hodgkin lymphomas: Secondary | ICD-10-CM | POA: Diagnosis not present

## 2021-01-08 DIAGNOSIS — C8590 Non-Hodgkin lymphoma, unspecified, unspecified site: Secondary | ICD-10-CM

## 2021-01-08 LAB — LACTATE DEHYDROGENASE: LDH: 249 U/L — ABNORMAL HIGH (ref 98–192)

## 2021-01-08 LAB — BASIC METABOLIC PANEL
Anion gap: 7 (ref 5–15)
BUN: 18 mg/dL (ref 8–23)
CO2: 29 mmol/L (ref 22–32)
Calcium: 9 mg/dL (ref 8.9–10.3)
Chloride: 105 mmol/L (ref 98–111)
Creatinine, Ser: 0.93 mg/dL (ref 0.61–1.24)
GFR, Estimated: >60 mL/min (ref >60)
Glucose, Bld: 93 mg/dL (ref 70–99)
Potassium: 4 mmol/L (ref 3.5–5.1)
Sodium: 141 mmol/L (ref 135–145)

## 2021-01-08 LAB — CBC WITH DIFFERENTIAL/PLATELET
Abs Immature Granulocytes: 0.1 10*3/uL — ABNORMAL HIGH (ref 0.00–0.07)
Basophils Absolute: 0 10*3/uL (ref 0.0–0.1)
Basophils Relative: 1 %
Eosinophils Absolute: 0.2 10*3/uL (ref 0.0–0.5)
Eosinophils Relative: 4 %
HCT: 32.6 % — ABNORMAL LOW (ref 39.0–52.0)
Hemoglobin: 11.5 g/dL — ABNORMAL LOW (ref 13.0–17.0)
Immature Granulocytes: 2 %
Lymphocytes Relative: 6 %
Lymphs Abs: 0.3 10*3/uL — ABNORMAL LOW (ref 0.7–4.0)
MCH: 36.2 pg — ABNORMAL HIGH (ref 26.0–34.0)
MCHC: 35.3 g/dL (ref 30.0–36.0)
MCV: 102.5 fL — ABNORMAL HIGH (ref 80.0–100.0)
Monocytes Absolute: 0.4 10*3/uL (ref 0.1–1.0)
Monocytes Relative: 9 %
Neutro Abs: 3.4 10*3/uL (ref 1.7–7.7)
Neutrophils Relative %: 78 %
Platelets: 165 10*3/uL (ref 150–400)
RBC: 3.18 MIL/uL — ABNORMAL LOW (ref 4.22–5.81)
RDW: 14.3 % (ref 11.5–15.5)
WBC: 4.4 10*3/uL (ref 4.0–10.5)
nRBC: 0 % (ref 0.0–0.2)

## 2021-01-19 ENCOUNTER — Encounter: Payer: Self-pay | Admitting: Internal Medicine

## 2021-01-22 ENCOUNTER — Inpatient Hospital Stay: Payer: Medicare Other

## 2021-01-22 DIAGNOSIS — C8590 Non-Hodgkin lymphoma, unspecified, unspecified site: Secondary | ICD-10-CM

## 2021-01-22 DIAGNOSIS — Z452 Encounter for adjustment and management of vascular access device: Secondary | ICD-10-CM | POA: Diagnosis not present

## 2021-01-22 DIAGNOSIS — Z8572 Personal history of non-Hodgkin lymphomas: Secondary | ICD-10-CM | POA: Diagnosis not present

## 2021-01-22 LAB — CBC WITH DIFFERENTIAL/PLATELET
Abs Immature Granulocytes: 0.04 10*3/uL (ref 0.00–0.07)
Basophils Absolute: 0 10*3/uL (ref 0.0–0.1)
Basophils Relative: 1 %
Eosinophils Absolute: 0.3 10*3/uL (ref 0.0–0.5)
Eosinophils Relative: 8 %
HCT: 30.6 % — ABNORMAL LOW (ref 39.0–52.0)
Hemoglobin: 11 g/dL — ABNORMAL LOW (ref 13.0–17.0)
Immature Granulocytes: 1 %
Lymphocytes Relative: 5 %
Lymphs Abs: 0.2 10*3/uL — ABNORMAL LOW (ref 0.7–4.0)
MCH: 35.5 pg — ABNORMAL HIGH (ref 26.0–34.0)
MCHC: 35.9 g/dL (ref 30.0–36.0)
MCV: 98.7 fL (ref 80.0–100.0)
Monocytes Absolute: 0.5 10*3/uL (ref 0.1–1.0)
Monocytes Relative: 15 %
Neutro Abs: 2.5 10*3/uL (ref 1.7–7.7)
Neutrophils Relative %: 70 %
Platelets: 126 10*3/uL — ABNORMAL LOW (ref 150–400)
RBC: 3.1 MIL/uL — ABNORMAL LOW (ref 4.22–5.81)
RDW: 14.6 % (ref 11.5–15.5)
WBC: 3.5 10*3/uL — ABNORMAL LOW (ref 4.0–10.5)
nRBC: 0 % (ref 0.0–0.2)

## 2021-01-22 LAB — BASIC METABOLIC PANEL
Anion gap: 9 (ref 5–15)
BUN: 21 mg/dL (ref 8–23)
CO2: 25 mmol/L (ref 22–32)
Calcium: 8.7 mg/dL — ABNORMAL LOW (ref 8.9–10.3)
Chloride: 103 mmol/L (ref 98–111)
Creatinine, Ser: 0.96 mg/dL (ref 0.61–1.24)
GFR, Estimated: >60 mL/min (ref >60)
Glucose, Bld: 107 mg/dL — ABNORMAL HIGH (ref 70–99)
Potassium: 3.6 mmol/L (ref 3.5–5.1)
Sodium: 137 mmol/L (ref 135–145)

## 2021-01-22 LAB — LACTATE DEHYDROGENASE: LDH: 310 U/L — ABNORMAL HIGH (ref 98–192)

## 2021-01-22 MED ORDER — HEPARIN SOD (PORK) LOCK FLUSH 100 UNIT/ML IV SOLN
INTRAVENOUS | Status: AC
  Filled 2021-01-22: qty 5

## 2021-01-22 MED ORDER — SODIUM CHLORIDE 0.9% FLUSH
10.0000 mL | INTRAVENOUS | Status: DC | PRN
  Administered 2021-01-22: 10 mL via INTRAVENOUS
  Filled 2021-01-22: qty 10

## 2021-01-22 MED ORDER — HEPARIN SOD (PORK) LOCK FLUSH 100 UNIT/ML IV SOLN
500.0000 [IU] | Freq: Once | INTRAVENOUS | Status: AC
  Administered 2021-01-22: 500 [IU] via INTRAVENOUS
  Filled 2021-01-22: qty 5

## 2021-01-28 DIAGNOSIS — Z298 Encounter for other specified prophylactic measures: Secondary | ICD-10-CM | POA: Diagnosis not present

## 2021-01-28 DIAGNOSIS — Z23 Encounter for immunization: Secondary | ICD-10-CM | POA: Diagnosis not present

## 2021-01-28 DIAGNOSIS — C8330 Diffuse large B-cell lymphoma, unspecified site: Secondary | ICD-10-CM | POA: Diagnosis not present

## 2021-01-28 DIAGNOSIS — Z20822 Contact with and (suspected) exposure to covid-19: Secondary | ICD-10-CM | POA: Diagnosis not present

## 2021-02-11 ENCOUNTER — Ambulatory Visit: Payer: Medicare Other | Admitting: Internal Medicine

## 2021-02-11 ENCOUNTER — Encounter: Payer: Self-pay | Admitting: Internal Medicine

## 2021-02-11 ENCOUNTER — Inpatient Hospital Stay: Payer: Medicare Other | Attending: Internal Medicine

## 2021-02-11 ENCOUNTER — Inpatient Hospital Stay (HOSPITAL_BASED_OUTPATIENT_CLINIC_OR_DEPARTMENT_OTHER): Payer: Medicare Other | Admitting: Internal Medicine

## 2021-02-11 ENCOUNTER — Other Ambulatory Visit: Payer: Medicare Other

## 2021-02-11 VITALS — BP 128/85 | HR 70 | Resp 18 | Wt 284.0 lb

## 2021-02-11 DIAGNOSIS — Z9221 Personal history of antineoplastic chemotherapy: Secondary | ICD-10-CM | POA: Diagnosis not present

## 2021-02-11 DIAGNOSIS — C8518 Unspecified B-cell lymphoma, lymph nodes of multiple sites: Secondary | ICD-10-CM | POA: Insufficient documentation

## 2021-02-11 DIAGNOSIS — Z86711 Personal history of pulmonary embolism: Secondary | ICD-10-CM | POA: Diagnosis not present

## 2021-02-11 DIAGNOSIS — Z7901 Long term (current) use of anticoagulants: Secondary | ICD-10-CM | POA: Diagnosis not present

## 2021-02-11 DIAGNOSIS — C8330 Diffuse large B-cell lymphoma, unspecified site: Secondary | ICD-10-CM | POA: Diagnosis not present

## 2021-02-11 DIAGNOSIS — Z79899 Other long term (current) drug therapy: Secondary | ICD-10-CM | POA: Insufficient documentation

## 2021-02-11 DIAGNOSIS — F17210 Nicotine dependence, cigarettes, uncomplicated: Secondary | ICD-10-CM | POA: Diagnosis not present

## 2021-02-11 DIAGNOSIS — C8590 Non-Hodgkin lymphoma, unspecified, unspecified site: Secondary | ICD-10-CM

## 2021-02-11 LAB — CBC WITH DIFFERENTIAL/PLATELET
Abs Immature Granulocytes: 0.02 10*3/uL (ref 0.00–0.07)
Basophils Absolute: 0 10*3/uL (ref 0.0–0.1)
Basophils Relative: 1 %
Eosinophils Absolute: 0.2 10*3/uL (ref 0.0–0.5)
Eosinophils Relative: 5 %
HCT: 34.2 % — ABNORMAL LOW (ref 39.0–52.0)
Hemoglobin: 12 g/dL — ABNORMAL LOW (ref 13.0–17.0)
Immature Granulocytes: 1 %
Lymphocytes Relative: 8 %
Lymphs Abs: 0.3 10*3/uL — ABNORMAL LOW (ref 0.7–4.0)
MCH: 35 pg — ABNORMAL HIGH (ref 26.0–34.0)
MCHC: 35.1 g/dL (ref 30.0–36.0)
MCV: 99.7 fL (ref 80.0–100.0)
Monocytes Absolute: 0.5 10*3/uL (ref 0.1–1.0)
Monocytes Relative: 13 %
Neutro Abs: 2.8 10*3/uL (ref 1.7–7.7)
Neutrophils Relative %: 72 %
Platelets: 141 10*3/uL — ABNORMAL LOW (ref 150–400)
RBC: 3.43 MIL/uL — ABNORMAL LOW (ref 4.22–5.81)
RDW: 15 % (ref 11.5–15.5)
WBC: 3.9 10*3/uL — ABNORMAL LOW (ref 4.0–10.5)
nRBC: 0 % (ref 0.0–0.2)

## 2021-02-11 LAB — COMPREHENSIVE METABOLIC PANEL
ALT: 23 U/L (ref 0–44)
AST: 25 U/L (ref 15–41)
Albumin: 4.5 g/dL (ref 3.5–5.0)
Alkaline Phosphatase: 69 U/L (ref 38–126)
Anion gap: 9 (ref 5–15)
BUN: 20 mg/dL (ref 8–23)
CO2: 26 mmol/L (ref 22–32)
Calcium: 9.1 mg/dL (ref 8.9–10.3)
Chloride: 105 mmol/L (ref 98–111)
Creatinine, Ser: 1 mg/dL (ref 0.61–1.24)
GFR, Estimated: >60 mL/min (ref >60)
Glucose, Bld: 102 mg/dL — ABNORMAL HIGH (ref 70–99)
Potassium: 4.1 mmol/L (ref 3.5–5.1)
Sodium: 140 mmol/L (ref 135–145)
Total Bilirubin: 0.9 mg/dL (ref 0.3–1.2)
Total Protein: 6.5 g/dL (ref 6.5–8.1)

## 2021-02-11 LAB — LACTATE DEHYDROGENASE: LDH: 233 U/L — ABNORMAL HIGH (ref 98–192)

## 2021-02-11 NOTE — Progress Notes (Signed)
[facility] NOTE  Patient Care Team: [name], MD as PCP - General (Family Medicine) [name], [name], MD (General Surgery) Cammie Sickle, MD as Consulting Physician (Internal Medicine)  CHIEF COMPLAINTS/PURPOSE OF CONSULTATION: Diffuse large B-cell lymphoma  #  Oncology History Overview Note  #June 29, 2019-CT- bulky bilateral neck adenopathy left more than right; September 2020-Diffuse B-cell lymphoma; ABC subtype; NEGATIVE FISH studies.  PET scan -bulky adenopathy involving neck /lymphadenopathy mediastinum /abdomen/pelvis inguinal lymph nodes; splenic involvement.  Question bone.  Stage IV [spleen involvement; no bone marrow biopsy].  BCL-2: Positive /BCL 6: Positive /MUM-1: Positive /CD10: Negative 'Ki-67: High proliferation index (90%) /CD30: Negative /Cyclin D1: Negative C-MYC: Equivocal (20% staining seen on limited and fragmented tissue) /EBER: Negative   # Ritux-Pred- 9/11; 9/18- R-COP [held adria- sec to EF 45-50%]  #May 2021-PET scan left neck recurrent disease; excisional biopsy [Dr. Vaught]-recurrent diffuse left B-cell lymphoma  #November 2020-bilateral PE; on xarelto  #J07/07/2020- 2021- Gem-CarboDex [II opinion [facility]; Dr. Thera Flake; stem cell transplant eval]; no Rituxan-repeat CD20 negative.   #S/p 2 cycles-gem carbo Dex-June 18, 2020 PET scan-slight increase in SUV uptake of the left cervical lymph nodes; no distant metastatic disease.  Discontinued chemotherapy; refer to CAR-T;  OCT 12th 2021- [facility]- -New extensive cervical, supraclavicular, intrathoracic, axillary and abdominal and pelvic lymph nodes, worrisome for lymphoma involvement. New splenomegaly with new, diffuse uptake, now more intense than the liver, concerning for lymphoma involvement. S/p POLA-BENDA  # CAR-T/Axi-cel on 09/16/20 preceded by fludarabine/cyclophosphamide lymphodepletion.[[facility]; Dr.Grover]; complicated by CRS-G-3 [ICU]; dec 27th-PET CR.   # Sep 2020-  Borderline low EF/Left ventricular hypokinesis [Dr.Gollan]-s October 9th stress test-nonischemic;   # DIAGNOSIS: DLBCL  STAGE:   4      ;GOALS: Cure  CURRENT/MOST RECENT THERAPY : R-COP/-R-CEOP. [C]    Lymphoma of lymph nodes ([place])  07/03/2019 Initial Diagnosis   Lymphoma of lymph nodes ([place])   07/13/2019 - 12/07/2019 Chemotherapy   The patient had palonosetron (ALOXI) injection 0.25 mg, 0.25 mg, Intravenous,  Once, 7 of 7 cycles Administration: 0.25 mg (07/20/2019), 0.25 mg (08/13/2019), 0.25 mg (11/13/2019), 0.25 mg (09/03/2019), 0.25 mg (10/02/2019), 0.25 mg (10/23/2019), 0.25 mg (12/04/2019) pegfilgrastim (NEULASTA ONPRO KIT) injection 6 mg, 6 mg, Subcutaneous, Once, 2 of 2 cycles Administration: 6 mg (10/04/2019), 6 mg (10/25/2019) pegfilgrastim-cbqv (UDENYCA) injection 6 mg, 6 mg, Subcutaneous, Once, 5 of 5 cycles Administration: 6 mg (07/23/2019), 6 mg (08/16/2019), 6 mg (09/06/2019), 6 mg (11/16/2019), 6 mg (12/07/2019) vinCRIStine (ONCOVIN) 2 mg in sodium chloride 0.9 % 50 mL chemo infusion, 2 mg, Intravenous,  Once, 7 of 7 cycles Administration: 2 mg (07/20/2019), 2 mg (08/13/2019), 2 mg (11/13/2019), 2 mg (09/03/2019), 2 mg (10/02/2019), 2 mg (10/23/2019), 2 mg (12/04/2019) cyclophosphamide (CYTOXAN) 1,900 mg in sodium chloride 0.9 % 250 mL chemo infusion, 750 mg/m2 = 1,900 mg, Intravenous,  Once, 7 of 7 cycles Administration: 1,900 mg (07/20/2019), 1,900 mg (08/13/2019), 2,000 mg (11/13/2019), 1,900 mg (09/03/2019), 2,000 mg (10/02/2019), 2,000 mg (10/23/2019), 2,000 mg (12/04/2019) etoposide (VEPESID) 250 mg in sodium chloride 0.9 % 1,000 mL chemo infusion, 100 mg/m2 = 250 mg, Intravenous,  Once, 6 of 6 cycles Dose modification: 50 mg/m2 (original dose 100 mg/m2, Cycle 3, Reason: Provider Judgment) Administration: 130 mg (08/14/2019), 130 mg (08/15/2019), 130 mg (09/03/2019), 130 mg (09/04/2019), 130 mg (09/05/2019), 130 mg (10/03/2019), 130 mg (10/04/2019), 130 mg (10/24/2019), 130 mg (10/25/2019), 130 mg  (11/13/2019), 130 mg (11/14/2019), 130 mg (11/15/2019), 130 mg (10/02/2019), 130 mg (10/23/2019),  130 mg (12/04/2019), 130 mg (12/05/2019), 130 mg (12/06/2019) dexamethasone (DECADRON) 12 mg in sodium chloride 0.9 % 145 mL IVPB, , Intravenous,  Once, 8 of 8 cycles Administration:  (07/13/2019),  (07/20/2019),  (08/13/2019),  (11/13/2019),  (09/03/2019),  (10/02/2019),  (10/23/2019),  (12/04/2019) riTUXimab-pvvr (RUXIENCE) 900 mg in sodium chloride 0.9 % 250 mL (2.6471 mg/mL) infusion, 375 mg/m2 = 900 mg (100 % of original dose 375 mg/m2), Intravenous,  Once, 1 of 1 cycle Dose modification: 375 mg/m2 (original dose 375 mg/m2, Cycle 1) Administration: 900 mg (07/13/2019)  for chemotherapy treatment.    05/09/2020 - 06/06/2020 Chemotherapy   The patient had palonosetron (ALOXI) injection 0.25 mg, 0.25 mg, Intravenous,  Once, 2 of 4 cycles Administration: 0.25 mg (05/09/2020), 0.25 mg (05/30/2020) CARBOplatin (PARAPLATIN) 750 mg in sodium chloride 0.9 % 250 mL chemo infusion, 750 mg (100 % of original dose 750 mg), Intravenous,  Once, 2 of 4 cycles Dose modification:   (original dose 750 mg, Cycle 1, Reason: Provider Judgment, Comment: labs from care every where. ) Administration: 750 mg (05/09/2020), 750 mg (05/30/2020) gemcitabine (GEMZAR) 2,500 mg in sodium chloride 0.9 % 250 mL chemo infusion, 2,584 mg, Intravenous,  Once, 2 of 4 cycles Administration: 2,500 mg (05/09/2020), 2,500 mg (05/16/2020), 2,500 mg (05/30/2020), 2,500 mg (06/06/2020) fosaprepitant (EMEND) 150 mg in sodium chloride 0.9 % 145 mL IVPB, 150 mg, Intravenous,  Once, 2 of 4 cycles Administration: 150 mg (05/09/2020), 150 mg (05/30/2020)  for chemotherapy treatment.    08/27/2020 -  Chemotherapy   The patient had dexamethasone (DECADRON) 4 MG tablet, 8 mg, Oral, Daily, 0 of 1 cycle, Start date: --, End date: -- palonosetron (ALOXI) injection 0.25 mg, 0.25 mg, Intravenous,  Once, 0 of 6 cycles pegfilgrastim-cbqv (UDENYCA) injection 6 mg, 6 mg, Subcutaneous, Once,  0 of 6 cycles bendamustine (BENDEKA) 250 mg in sodium chloride 0.9 % 50 mL (4.1667 mg/mL) chemo infusion, 90 mg/m2, Intravenous,  Once, 0 of 6 cycles riTUXimab-pvvr (RUXIENCE) 1,000 mg in sodium chloride 0.9 % 250 mL (2.8571 mg/mL) infusion, 375 mg/m2 = 1,000 mg, Intravenous,  Once, 0 of 6 cycles polatuzumab vedotin-piiq (POLIVY) 246 mg in sodium chloride 0.9 % 100 mL (2.1906 mg/mL) chemo infusion, 1.8 mg/kg, Intravenous,  Once, 0 of 6 cycles  for chemotherapy treatment.    11/13/2020 Cancer Staging   Staging form: Hodgkin and Non-Hodgkin Lymphoma, AJCC 8th Edition - Clinical: Stage IV (Diffuse large B-cell lymphoma) - Signed by Cammie Sickle, MD on 11/13/2020     HISTORY OF PRESENTING ILLNESS:  Eric [name] 70 y.o.  male relapsed/refractory diffuse large B-cell lymphoma currently s/p CART therapy at [facility] in November 2021 is here for follow-up.  In the interim patient had a PET scan February 2022 that showed continued complete response.  He denies any new lumps or bumps.  Denies any nausea vomiting.  Denies any shortness of breath or cough.  Review of Systems  Constitutional: Negative for chills, diaphoresis and fever.  HENT: Positive for hearing loss. Negative for nosebleeds and sore throat.   Eyes: Negative for double vision.  Respiratory: Negative for hemoptysis, sputum production and wheezing.   Cardiovascular: Negative for chest pain, palpitations, orthopnea and leg swelling.  Gastrointestinal: Negative for abdominal pain, blood in stool, constipation, diarrhea, heartburn, melena, nausea and vomiting.  Genitourinary: Negative for dysuria, frequency and urgency.  Musculoskeletal: Negative for back pain and joint pain.  Skin: Negative.  Negative for itching and rash.  Neurological: Negative for dizziness, tingling, focal weakness, weakness and  headaches.  Endo/Heme/Allergies: Does not bruise/bleed easily.  Psychiatric/Behavioral: Negative for depression. The patient does not have  insomnia.      MEDICAL HISTORY:  Past Medical History:  Diagnosis Date  . Adopted person   . Colon polyps   . Pancreatitis   . Rectal bleeding 02/24/14  . Sepsis ([facility]) 09/18/2019    SURGICAL HISTORY: Past Surgical History:  Procedure Laterality Date  . CHOLECYSTECTOMY    . COLONOSCOPY  02/28/14  . IR IMAGING GUIDED PORT INSERTION  07/18/2019  . LYMPH NODE BIOPSY Left 03/28/2020   Procedure: EXCISION OF SUBMANDIBULAR  LYMPH NODE;  Surgeon: Bud Face, MD;  Location: [facility];  Service: ENT;  Laterality: Left;  . POLYPECTOMY     colon polyp removed  . TONSILLECTOMY      SOCIAL HISTORY: Social History   Socioeconomic History  . Marital status: Married    Spouse name: Not on file  . Number of children: Not on file  . Years of education: Not on file  . Highest education level: Not on file  Occupational History    Comment: Retired from [employer]  . Smoking status: Current Every Day Smoker    Packs/day: 1.00    Years: 20.00    Pack years: 20.00    Types: Cigars  . Smokeless tobacco: Never Used  . Tobacco comment: smoke 1/2 ppd cigaretes since 70 yo. changed to cigars at age July 2004.   Vaping Use  . Vaping Use: Never used  Substance and Sexual Activity  . Alcohol use: Yes    Comment: 6-7 drinks weekly  . Drug use: No  . Sexual activity: Not on file  Other Topics Concern  . Not on file  Social History Narrative   Lives at home with wife; in [place]. retd from [employer]. Cigar smoker; drinks liquor. Children - grown up.    Social Determinants of Health   Financial Resource Strain: Not on file  Food Insecurity: Not on file  Transportation Needs: Not on file  Physical Activity: Not on file  Stress: Not on file  Social Connections: Not on file  Intimate Partner Violence: Not on file    FAMILY HISTORY: Family History  Adopted: Yes    ALLERGIES:  is allergic to allopurinol, azithromycin, and losartan potassium.  MEDICATIONS:   Current Outpatient Medications  Medication Sig Dispense Refill  . acyclovir (ZOVIRAX) 400 MG tablet Take 1 tablet (400 mg total) by mouth 2 (two) times daily. 60 tablet 3  . carvedilol (COREG) 6.25 MG tablet TAKE ONE TABLET BY MOUTH TWICE DAILY 180 tablet 3  . lidocaine-prilocaine (EMLA) cream Apply generously on the port 30-45 prior to accessing the port. 30 g 1  . Multiple Vitamins-Minerals (MULTI-VITAMIN/MINERALS PO) Take 1 capsule by mouth.    . sulfamethoxazole-trimethoprim (BACTRIM DS) 800-160 MG tablet Take 1 tablet by mouth 2 (two) times daily. Take it on Monday ;wed; Friday 30 tablet 1  . UNABLE TO FIND Med Name: CBD oil (Patient not taking: Reported on 02/11/2021)     No current facility-administered medications for this visit.      Marland Kitchen  PHYSICAL EXAMINATION: ECOG PERFORMANCE STATUS: 1 - Symptomatic but completely ambulatory  Vitals:   02/11/21 1029  BP: 128/85  Pulse: 70  Resp: 18  SpO2: 98%   Filed Weights   02/11/21 1029  Weight: 284 lb (128.8 kg)    Physical Exam HENT:     Head: Normocephalic and atraumatic.     Mouth/Throat:  Pharynx: No oropharyngeal exudate.  Eyes:     Pupils: Pupils are equal, round, and reactive to light.  Cardiovascular:     Rate and Rhythm: Normal rate and regular rhythm.  Pulmonary:     Effort: Pulmonary effort is normal. No respiratory distress.     Breath sounds: Normal breath sounds. No wheezing.  Abdominal:     General: Bowel sounds are normal. There is no distension.     Palpations: Abdomen is soft. There is no mass.     Tenderness: There is no abdominal tenderness. There is no guarding or rebound.  Musculoskeletal:        General: No tenderness. Normal range of motion.     Cervical back: Normal range of motion and neck supple.  Skin:    General: Skin is warm.  Neurological:     Mental Status: He is alert and oriented to person, place, and time.  Psychiatric:        Mood and Affect: Affect normal.    LABORATORY  DATA:  I have reviewed the data as listed Lab Results  Component Value Date   WBC 3.9 (L) 02/11/2021   HGB 12.0 (L) 02/11/2021   HCT 34.2 (L) 02/11/2021   MCV 99.7 02/11/2021   PLT 141 (L) 02/11/2021   Recent Labs    05/30/20 0830 06/06/20 0859 06/23/20 0933 06/23/20 0933 08/15/20 0937 11/13/20 0944 11/27/20 0954 01/08/21 0903 01/22/21 1004 02/11/21 0945  NA 138 139 140  --  142 140   < > 141 137 140  K 3.8 4.0 3.8  --  3.8 3.9   < > 4.0 3.6 4.1  CL 103 101 105  --  104 105   < > 105 103 105  CO2 $Re'26 28 27  'uZD$ --  28 26   < > $R'29 25 26  'YI$ GLUCOSE 136* 131* 119*  --  120* 92   < > 93 107* 102*  BUN $Re'17 23 13  'Upw$ --  19 18   < > $R'18 21 20  'rp$ CREATININE 0.86 0.80 0.92  --  0.91 1.02   < > 0.93 0.96 1.00  CALCIUM 8.8* 8.9 8.7*  --  8.7* 9.2   < > 9.0 8.7* 9.1  GFRNONAA >60 >60 >60   < > >60 >60   < > >60 >60 >60  GFRAA >60 >60 >60  --   --   --   --   --   --   --   PROT 6.3* 6.5 6.4*  --  6.3* 5.8*  --   --   --  6.5  ALBUMIN 4.0 4.0 4.1  --  4.1 4.3  --   --   --  4.5  AST $Re'22 26 28  'ABX$ --  32 28  --   --   --  25  ALT 28 60* 41  --  25 31  --   --   --  23  ALKPHOS 76 80 69  --  68 49  --   --   --  69  BILITOT 1.0 1.1 0.8  --  2.1* 1.2  --   --   --  0.9   < > = values in this interval not displayed.    RADIOGRAPHIC STUDIES: I have personally reviewed the radiological images as listed and agreed with the findings in the report. No results found.  ASSESSMENT & PLAN:   Lymphoma of lymph nodes ([place]) # Chemo refractory/ Relapsed diffuse large  B-cell lymphoma-ABC subtype; IV [spleen]; NOV 2021- CART therapy.  FEB 2022-  No abnormal uptake to suggest metabolically active disease. [place] 1.  Patient awaiting repeat PET scan in May 2022/[facility].  #Mild anemia/mild thrombocytopenia-lymphopenia likely secondary to CAR-T.  No clinical concerns of recurrence.  # Borderline LOW EF of 45-50%; Sep Echo 50-55% [Dr.Gollan]- on coreg BID-stable.  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  Stable.  #IV access/Mediport-port flush at [facility]- in may 2022  # DISPOSITION:  # follow up 3 months- MD; labs- cbc/cmp/ldh; port flush-Dr.B     All questions were answered. The patient knows to call the clinic with any problems, questions or concerns.    Cammie Sickle, MD 02/11/2021 1:35 PM

## 2021-02-11 NOTE — Assessment & Plan Note (Addendum)
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  FEB 2022-  No abnormal uptake to suggest metabolically active disease. [place] 1.  Patient awaiting repeat PET scan in May 2022/[facility].  #Mild anemia/mild thrombocytopenia-lymphopenia likely secondary to CAR-T.  No clinical concerns of recurrence.  # Borderline LOW EF of 45-50%; Sep Echo 50-55% [Dr.Gollan]- on coreg BID-stable.  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  Stable.  #IV access/Mediport-port flush at [facility]- in may 2022  # DISPOSITION:  # follow up 3 months- MD; labs- cbc/cmp/ldh; port flush-Dr.B

## 2021-02-11 NOTE — Progress Notes (Signed)
Pt in for follow up, denies any concerns today. 

## 2021-04-21 DIAGNOSIS — H903 Sensorineural hearing loss, bilateral: Secondary | ICD-10-CM | POA: Diagnosis not present

## 2021-04-21 DIAGNOSIS — H6123 Impacted cerumen, bilateral: Secondary | ICD-10-CM | POA: Diagnosis not present

## 2021-04-22 DIAGNOSIS — C8330 Diffuse large B-cell lymphoma, unspecified site: Secondary | ICD-10-CM | POA: Diagnosis not present

## 2021-04-22 DIAGNOSIS — C8338 Diffuse large B-cell lymphoma, lymph nodes of multiple sites: Secondary | ICD-10-CM | POA: Diagnosis not present

## 2021-04-22 DIAGNOSIS — Z79899 Other long term (current) drug therapy: Secondary | ICD-10-CM | POA: Diagnosis not present

## 2021-05-13 ENCOUNTER — Inpatient Hospital Stay: Payer: Medicare Other | Attending: Internal Medicine

## 2021-05-13 ENCOUNTER — Inpatient Hospital Stay (HOSPITAL_BASED_OUTPATIENT_CLINIC_OR_DEPARTMENT_OTHER): Payer: Medicare Other | Admitting: Internal Medicine

## 2021-05-13 VITALS — BP 137/80 | HR 66 | Temp 97.0°F | Resp 18 | Wt 288.0 lb

## 2021-05-13 DIAGNOSIS — C8590 Non-Hodgkin lymphoma, unspecified, unspecified site: Secondary | ICD-10-CM

## 2021-05-13 DIAGNOSIS — C8330 Diffuse large B-cell lymphoma, unspecified site: Secondary | ICD-10-CM

## 2021-05-13 DIAGNOSIS — E669 Obesity, unspecified: Secondary | ICD-10-CM | POA: Insufficient documentation

## 2021-05-13 DIAGNOSIS — Z452 Encounter for adjustment and management of vascular access device: Secondary | ICD-10-CM

## 2021-05-13 DIAGNOSIS — D649 Anemia, unspecified: Secondary | ICD-10-CM | POA: Insufficient documentation

## 2021-05-13 DIAGNOSIS — C8338 Diffuse large B-cell lymphoma, lymph nodes of multiple sites: Secondary | ICD-10-CM | POA: Diagnosis not present

## 2021-05-13 LAB — COMPREHENSIVE METABOLIC PANEL
ALT: 21 U/L (ref 0–44)
AST: 22 U/L (ref 15–41)
Albumin: 4.1 g/dL (ref 3.5–5.0)
Alkaline Phosphatase: 84 U/L (ref 38–126)
Anion gap: 10 (ref 5–15)
BUN: 17 mg/dL (ref 8–23)
CO2: 25 mmol/L (ref 22–32)
Calcium: 8.6 mg/dL — ABNORMAL LOW (ref 8.9–10.3)
Chloride: 105 mmol/L (ref 98–111)
Creatinine, Ser: 0.94 mg/dL (ref 0.61–1.24)
GFR, Estimated: >60 mL/min (ref >60)
Glucose, Bld: 122 mg/dL — ABNORMAL HIGH (ref 70–99)
Potassium: 3.5 mmol/L (ref 3.5–5.1)
Sodium: 140 mmol/L (ref 135–145)
Total Bilirubin: 0.7 mg/dL (ref 0.3–1.2)
Total Protein: 6.2 g/dL — ABNORMAL LOW (ref 6.5–8.1)

## 2021-05-13 LAB — CBC WITH DIFFERENTIAL/PLATELET
Abs Immature Granulocytes: 0.03 10*3/uL (ref 0.00–0.07)
Basophils Absolute: 0 10*3/uL (ref 0.0–0.1)
Basophils Relative: 1 %
Eosinophils Absolute: 0.2 10*3/uL (ref 0.0–0.5)
Eosinophils Relative: 5 %
HCT: 36 % — ABNORMAL LOW (ref 39.0–52.0)
Hemoglobin: 12.6 g/dL — ABNORMAL LOW (ref 13.0–17.0)
Immature Granulocytes: 1 %
Lymphocytes Relative: 8 %
Lymphs Abs: 0.3 10*3/uL — ABNORMAL LOW (ref 0.7–4.0)
MCH: 34.1 pg — ABNORMAL HIGH (ref 26.0–34.0)
MCHC: 35 g/dL (ref 30.0–36.0)
MCV: 97.3 fL (ref 80.0–100.0)
Monocytes Absolute: 0.3 10*3/uL (ref 0.1–1.0)
Monocytes Relative: 9 %
Neutro Abs: 2.7 10*3/uL (ref 1.7–7.7)
Neutrophils Relative %: 76 %
Platelets: 142 10*3/uL — ABNORMAL LOW (ref 150–400)
RBC: 3.7 MIL/uL — ABNORMAL LOW (ref 4.22–5.81)
RDW: 16.1 % — ABNORMAL HIGH (ref 11.5–15.5)
WBC: 3.5 10*3/uL — ABNORMAL LOW (ref 4.0–10.5)
nRBC: 0 % (ref 0.0–0.2)

## 2021-05-13 LAB — LACTATE DEHYDROGENASE: LDH: 204 U/L — ABNORMAL HIGH (ref 98–192)

## 2021-05-13 MED ORDER — HEPARIN SOD (PORK) LOCK FLUSH 100 UNIT/ML IV SOLN
500.0000 [IU] | Freq: Once | INTRAVENOUS | Status: AC
Start: 2021-05-13 — End: 2021-05-13
  Administered 2021-05-13: 500 [IU] via INTRAVENOUS
  Filled 2021-05-13: qty 5

## 2021-05-13 MED ORDER — HEPARIN SOD (PORK) LOCK FLUSH 100 UNIT/ML IV SOLN
INTRAVENOUS | Status: AC
  Filled 2021-05-13: qty 5

## 2021-05-13 MED ORDER — SODIUM CHLORIDE 0.9% FLUSH
10.0000 mL | INTRAVENOUS | Status: DC | PRN
  Administered 2021-05-13: 10 mL via INTRAVENOUS
  Filled 2021-05-13: qty 10

## 2021-05-13 NOTE — Progress Notes (Signed)
[facility] NOTE  Patient Care Team: [name], MD as PCP - General (Family Medicine) [name], [name], MD (General Surgery) Cammie Sickle, MD as Consulting Physician (Internal Medicine)  CHIEF COMPLAINTS/PURPOSE OF CONSULTATION: Diffuse large B-cell lymphoma  #  Oncology History Overview Note  #June 29, 2019-CT- bulky bilateral neck adenopathy left more than right; September 2020-Diffuse B-cell lymphoma; ABC subtype; NEGATIVE FISH studies.  PET scan -bulky adenopathy involving neck /lymphadenopathy mediastinum /abdomen/pelvis inguinal lymph nodes; splenic involvement.  Question bone.  Stage IV [spleen involvement; no bone marrow biopsy].  BCL-2: Positive /BCL 6: Positive /MUM-1: Positive /CD10: Negative 'Ki-67: High proliferation index (90%) /CD30: Negative /Cyclin D1: Negative C-MYC: Equivocal (20% staining seen on limited and fragmented tissue) /EBER: Negative   # Ritux-Pred- 9/11; 9/18- R-COP [held adria- sec to EF 45-50%]  #May 2021-PET scan left neck recurrent disease; excisional biopsy [Dr. Vaught]-recurrent diffuse left B-cell lymphoma  #November 2020-bilateral PE; on xarelto  #J07/07/2020- 2021- Gem-CarboDex [II opinion [facility]; Dr. Thera Flake; stem cell transplant eval]; no Rituxan-repeat CD20 negative.   #S/p 2 cycles-gem carbo Dex-June 18, 2020 PET scan-slight increase in SUV uptake of the left cervical lymph nodes; no distant metastatic disease.  Discontinued chemotherapy; refer to CAR-T;  OCT 12th 2021- [facility]- -New extensive cervical, supraclavicular, intrathoracic, axillary and abdominal and pelvic lymph nodes, worrisome for lymphoma involvement. New splenomegaly with new, diffuse uptake, now more intense than the liver, concerning for lymphoma involvement. S/p POLA-BENDA  # CAR-T/Axi-cel on 09/16/20 preceded by fludarabine/cyclophosphamide lymphodepletion.[[facility]; Dr.Grover]; complicated by CRS-G-3 [ICU]; dec 27th-PET CR.   # Sep 2020-  Borderline low EF/Left ventricular hypokinesis [Dr.Gollan]-s October 9th stress test-nonischemic;   # DIAGNOSIS: DLBCL  STAGE:   4      ;GOALS: Cure  CURRENT/MOST RECENT THERAPY : R-COP/-R-CEOP. [C]    Lymphoma of lymph nodes ([place])  07/03/2019 Initial Diagnosis   Lymphoma of lymph nodes ([place])   07/13/2019 - 12/07/2019 Chemotherapy   The patient had palonosetron (ALOXI) injection 0.25 mg, 0.25 mg, Intravenous,  Once, 7 of 7 cycles Administration: 0.25 mg (07/20/2019), 0.25 mg (08/13/2019), 0.25 mg (11/13/2019), 0.25 mg (09/03/2019), 0.25 mg (10/02/2019), 0.25 mg (10/23/2019), 0.25 mg (12/04/2019) pegfilgrastim (NEULASTA ONPRO KIT) injection 6 mg, 6 mg, Subcutaneous, Once, 2 of 2 cycles Administration: 6 mg (10/04/2019), 6 mg (10/25/2019) pegfilgrastim-cbqv (UDENYCA) injection 6 mg, 6 mg, Subcutaneous, Once, 5 of 5 cycles Administration: 6 mg (07/23/2019), 6 mg (08/16/2019), 6 mg (09/06/2019), 6 mg (11/16/2019), 6 mg (12/07/2019) vinCRIStine (ONCOVIN) 2 mg in sodium chloride 0.9 % 50 mL chemo infusion, 2 mg, Intravenous,  Once, 7 of 7 cycles Administration: 2 mg (07/20/2019), 2 mg (08/13/2019), 2 mg (11/13/2019), 2 mg (09/03/2019), 2 mg (10/02/2019), 2 mg (10/23/2019), 2 mg (12/04/2019) cyclophosphamide (CYTOXAN) 1,900 mg in sodium chloride 0.9 % 250 mL chemo infusion, 750 mg/m2 = 1,900 mg, Intravenous,  Once, 7 of 7 cycles Administration: 1,900 mg (07/20/2019), 1,900 mg (08/13/2019), 2,000 mg (11/13/2019), 1,900 mg (09/03/2019), 2,000 mg (10/02/2019), 2,000 mg (10/23/2019), 2,000 mg (12/04/2019) etoposide (VEPESID) 250 mg in sodium chloride 0.9 % 1,000 mL chemo infusion, 100 mg/m2 = 250 mg, Intravenous,  Once, 6 of 6 cycles Dose modification: 50 mg/m2 (original dose 100 mg/m2, Cycle 3, Reason: Provider Judgment) Administration: 130 mg (08/14/2019), 130 mg (08/15/2019), 130 mg (09/03/2019), 130 mg (09/04/2019), 130 mg (09/05/2019), 130 mg (10/03/2019), 130 mg (10/04/2019), 130 mg (10/24/2019), 130 mg (10/25/2019), 130 mg  (11/13/2019), 130 mg (11/14/2019), 130 mg (11/15/2019), 130 mg (10/02/2019), 130 mg (10/23/2019),  130 mg (12/04/2019), 130 mg (12/05/2019), 130 mg (12/06/2019) dexamethasone (DECADRON) 12 mg in sodium chloride 0.9 % 145 mL IVPB, , Intravenous,  Once, 8 of 8 cycles Administration:  (07/13/2019),  (07/20/2019),  (08/13/2019),  (11/13/2019),  (09/03/2019),  (10/02/2019),  (10/23/2019),  (12/04/2019) riTUXimab-pvvr (RUXIENCE) 900 mg in sodium chloride 0.9 % 250 mL (2.6471 mg/mL) infusion, 375 mg/m2 = 900 mg (100 % of original dose 375 mg/m2), Intravenous,  Once, 1 of 1 cycle Dose modification: 375 mg/m2 (original dose 375 mg/m2, Cycle 1) Administration: 900 mg (07/13/2019)   for chemotherapy treatment.     05/09/2020 - 06/06/2020 Chemotherapy   The patient had palonosetron (ALOXI) injection 0.25 mg, 0.25 mg, Intravenous,  Once, 2 of 4 cycles Administration: 0.25 mg (05/09/2020), 0.25 mg (05/30/2020) CARBOplatin (PARAPLATIN) 750 mg in sodium chloride 0.9 % 250 mL chemo infusion, 750 mg (100 % of original dose 750 mg), Intravenous,  Once, 2 of 4 cycles Dose modification:   (original dose 750 mg, Cycle 1, Reason: Provider Judgment, Comment: labs from care every where. ) Administration: 750 mg (05/09/2020), 750 mg (05/30/2020) gemcitabine (GEMZAR) 2,500 mg in sodium chloride 0.9 % 250 mL chemo infusion, 2,584 mg, Intravenous,  Once, 2 of 4 cycles Administration: 2,500 mg (05/09/2020), 2,500 mg (05/16/2020), 2,500 mg (05/30/2020), 2,500 mg (06/06/2020) fosaprepitant (EMEND) 150 mg in sodium chloride 0.9 % 145 mL IVPB, 150 mg, Intravenous,  Once, 2 of 4 cycles Administration: 150 mg (05/09/2020), 150 mg (05/30/2020)   for chemotherapy treatment.     08/27/2020 -  Chemotherapy   The patient had dexamethasone (DECADRON) 4 MG tablet, 8 mg, Oral, Daily, 0 of 1 cycle, Start date: --, End date: -- palonosetron (ALOXI) injection 0.25 mg, 0.25 mg, Intravenous,  Once, 0 of 6 cycles pegfilgrastim-cbqv (UDENYCA) injection 6 mg, 6 mg,  Subcutaneous, Once, 0 of 6 cycles bendamustine (BENDEKA) 250 mg in sodium chloride 0.9 % 50 mL (4.1667 mg/mL) chemo infusion, 90 mg/m2, Intravenous,  Once, 0 of 6 cycles riTUXimab-pvvr (RUXIENCE) 1,000 mg in sodium chloride 0.9 % 250 mL (2.8571 mg/mL) infusion, 375 mg/m2 = 1,000 mg, Intravenous,  Once, 0 of 6 cycles polatuzumab vedotin-piiq (POLIVY) 246 mg in sodium chloride 0.9 % 100 mL (2.1906 mg/mL) chemo infusion, 1.8 mg/kg, Intravenous,  Once, 0 of 6 cycles   for chemotherapy treatment.     11/13/2020 Cancer Staging   Staging form: Hodgkin and Non-Hodgkin Lymphoma, AJCC 8th Edition - Clinical: Stage IV (Diffuse large B-cell lymphoma) - Signed by Cammie Sickle, MD on 11/13/2020      HISTORY OF PRESENTING ILLNESS:  Eric [name] 70 y.o.  male relapsed/refractory diffuse large B-cell lymphoma currently s/p CART therapy at [facility] in November 2021 is here for follow-up.    In the interim patient had PET scan in May 2022-showed continued response of his lymphadenopathy.   Patient denies any new lumps or bumps.  Denies any nausea vomiting.  Shortness of breath.  No cough.  No fever no chills.  Review of Systems  Constitutional:  Negative for chills, diaphoresis and fever.  HENT:  Positive for hearing loss. Negative for nosebleeds and sore throat.   Eyes:  Negative for double vision.  Respiratory:  Negative for hemoptysis, sputum production and wheezing.   Cardiovascular:  Negative for chest pain, palpitations, orthopnea and leg swelling.  Gastrointestinal:  Negative for abdominal pain, blood in stool, constipation, diarrhea, heartburn, melena, nausea and vomiting.  Genitourinary:  Negative for dysuria, frequency and urgency.  Musculoskeletal:  Negative for back pain  and joint pain.  Skin: Negative.  Negative for itching and rash.  Neurological:  Negative for dizziness, tingling, focal weakness, weakness and headaches.  Endo/Heme/Allergies:  Does not bruise/bleed easily.   Psychiatric/Behavioral:  Negative for depression. The patient does not have insomnia.     MEDICAL HISTORY:  Past Medical History:  Diagnosis Date  . Adopted person   . Colon polyps   . Pancreatitis   . Rectal bleeding 02/24/14  . Sepsis ([place]) 09/18/2019    SURGICAL HISTORY: Past Surgical History:  Procedure Laterality Date  . CHOLECYSTECTOMY    . COLONOSCOPY  02/28/14  . IR IMAGING GUIDED PORT INSERTION  07/18/2019  . LYMPH NODE BIOPSY Left 03/28/2020   Procedure: EXCISION OF SUBMANDIBULAR  LYMPH NODE;  Surgeon: Carloyn Manner, MD;  Location: [facility];  Service: ENT;  Laterality: Left;  . POLYPECTOMY     colon polyp removed  . TONSILLECTOMY      SOCIAL HISTORY: Social History   Socioeconomic History  . Marital status: Married    Spouse name: Not on file  . Number of children: Not on file  . Years of education: Not on file  . Highest education level: Not on file  Occupational History    Comment: Retired from [employer]  . Smoking status: Every Day    Packs/day: 1.00    Years: 20.00    Pack years: 20.00    Types: Cigars, Cigarettes  . Smokeless tobacco: Never  . Tobacco comments:    smoke 1/2 ppd cigaretes since 70 yo. changed to cigars at age July 2004.   Vaping Use  . Vaping Use: Never used  Substance and Sexual Activity  . Alcohol use: Yes    Comment: 6-7 drinks weekly  . Drug use: No  . Sexual activity: Not on file  Other Topics Concern  . Not on file  Social History Narrative   Lives at home with wife; in [place]. retd from [employer] smoker; drinks liquor. Children - grown up.    Social Determinants of Health   Financial Resource Strain: Not on file  Food Insecurity: Not on file  Transportation Needs: Not on file  Physical Activity: Not on file  Stress: Not on file  Social Connections: Not on file  Intimate Partner Violence: Not on file    FAMILY HISTORY: Family History  Adopted: Yes    ALLERGIES:  is allergic  to allopurinol, azithromycin, and losartan potassium.  MEDICATIONS:  Current Outpatient Medications  Medication Sig Dispense Refill  . carvedilol (COREG) 6.25 MG tablet TAKE ONE TABLET BY MOUTH TWICE DAILY 180 tablet 3  . lidocaine-prilocaine (EMLA) cream Apply generously on the port 30-45 prior to accessing the port. 30 g 1  . Multiple Vitamin (MULTI-VITAMIN) tablet Take 1 tablet by mouth daily.    Marland Kitchen sulfamethoxazole-trimethoprim (BACTRIM DS) 800-160 MG tablet Take 1 tablet by mouth 2 (two) times daily. Take it on Monday ;wed; Friday 30 tablet 1  . valACYclovir (VALTREX) 500 MG tablet Take 500 mg by mouth daily.    Marland Kitchen UNABLE TO FIND Med Name: CBD oil (Patient not taking: No sig reported)     No current facility-administered medications for this visit.      Marland Kitchen  PHYSICAL EXAMINATION: ECOG PERFORMANCE STATUS: 1 - Symptomatic but completely ambulatory  Vitals:   05/13/21 1024  BP: 137/80  Pulse: 66  Resp: 18  Temp: (!) 97 F (36.1 C)  SpO2: 99%   Filed Weights   05/13/21 1022  Weight: 288 lb (130.6 kg)    Physical Exam HENT:     Head: Normocephalic and atraumatic.     Mouth/Throat:     Pharynx: No oropharyngeal exudate.  Eyes:     Pupils: Pupils are equal, round, and reactive to light.  Cardiovascular:     Rate and Rhythm: Normal rate and regular rhythm.  Pulmonary:     Effort: Pulmonary effort is normal. No respiratory distress.     Breath sounds: Normal breath sounds. No wheezing.  Abdominal:     General: Bowel sounds are normal. There is no distension.     Palpations: Abdomen is soft. There is no mass.     Tenderness: no abdominal tenderness There is no guarding or rebound.  Musculoskeletal:        General: No tenderness. Normal range of motion.     Cervical back: Normal range of motion and neck supple.  Skin:    General: Skin is warm.  Neurological:     Mental Status: He is alert and oriented to person, place, and time.  Psychiatric:        Mood and Affect:  Affect normal.   LABORATORY DATA:  I have reviewed the data as listed Lab Results  Component Value Date   WBC 3.5 (L) 05/13/2021   HGB 12.6 (L) 05/13/2021   HCT 36.0 (L) 05/13/2021   MCV 97.3 05/13/2021   PLT 142 (L) 05/13/2021   Recent Labs    05/30/20 0830 06/06/20 0859 06/23/20 0933 08/15/20 0937 11/13/20 0944 11/27/20 0954 01/22/21 1004 02/11/21 0945 05/13/21 1007  NA 138 139 140   < > 140   < > 137 140 140  K 3.8 4.0 3.8   < > 3.9   < > 3.6 4.1 3.5  CL 103 101 105   < > 105   < > 103 105 105  CO2 $Re'26 28 27   'YGW$ < > 26   < > $R'25 26 25  'gb$ GLUCOSE 136* 131* 119*   < > 92   < > 107* 102* 122*  BUN $Re'17 23 13   'bdS$ < > 18   < > $R'21 20 17  'xa$ CREATININE 0.86 0.80 0.92   < > 1.02   < > 0.96 1.00 0.94  CALCIUM 8.8* 8.9 8.7*   < > 9.2   < > 8.7* 9.1 8.6*  GFRNONAA >60 >60 >60   < > >60   < > >60 >60 >60  GFRAA >60 >60 >60  --   --   --   --   --   --   PROT 6.3* 6.5 6.4*   < > 5.8*  --   --  6.5 6.2*  ALBUMIN 4.0 4.0 4.1   < > 4.3  --   --  4.5 4.1  AST $Re'22 26 28   'RoK$ < > 28  --   --  25 22  ALT 28 60* 41   < > 31  --   --  23 21  ALKPHOS 76 80 69   < > 49  --   --  69 84  BILITOT 1.0 1.1 0.8   < > 1.2  --   --  0.9 0.7   < > = values in this interval not displayed.    RADIOGRAPHIC STUDIES: I have personally reviewed the radiological images as listed and agreed with the findings in the report. No results found.  ASSESSMENT & PLAN:   Lymphoma of lymph nodes (  [place]) # Chemo refractory/ Relapsed diffuse large B-cell lymphoma-ABC subtype; IV [spleen]; NOV 2021- CART therapy.  May 2022- [facility] PET-  No abnormal uptake to suggest metabolically active disease. Deauville 1; Similar splenomegaly.  Patient is doing extremely well clinically.  #Mild anemia/mild thrombocytopenia-lymphopenia likely secondary to CAR-T.  No clinical concerns of recurrence.  Patient will continue valacyclovir/Bactrim for now.  # Borderline LOW EF of 45-50%; Sep Echo 50-55% [Dr.Gollan]- on coreg BID stable.  #Antimicrobial  prophylaxis-continue Merchandiser, retail.  Stable  #IV access/Mediport-we will refer for port explantation.  # obesity: Recommend weight loss; nutritional recommendations/low-carb diet.   # DISPOSITION:  # referral for port exaplantation  # follow up 3 months- MD; labs- cbc/cmp/ldh;Dr.B   All questions were answered. The patient knows to call the clinic with any problems, questions or concerns.    Cammie Sickle, MD 05/14/2021 10:40 PM

## 2021-05-13 NOTE — Assessment & Plan Note (Addendum)
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  May 2022- [facility] PET-  No abnormal uptake to suggest metabolically active disease. Deauville 1; Similar splenomegaly.  Patient is doing extremely well clinically.  #Mild anemia/mild thrombocytopenia-lymphopenia likely secondary to CAR-T.  No clinical concerns of recurrence.  Patient will continue valacyclovir/Bactrim for now.  # Borderline LOW EF of 45-50%; Sep Echo 50-55% [Dr.Gollan]- on coreg BID stable.  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  Stable  #IV access/Mediport-we will refer for port explantation.  # obesity: Recommend weight loss; nutritional recommendations/low-carb diet.   # DISPOSITION:  # referral for port exaplantation  # follow up 3 months- MD; labs- cbc/cmp/ldh;Dr.B

## 2021-05-13 NOTE — Progress Notes (Signed)
Patient wants to know when he can have port removed

## 2021-05-14 ENCOUNTER — Encounter: Payer: Self-pay | Admitting: *Deleted

## 2021-05-14 ENCOUNTER — Encounter: Payer: Self-pay | Admitting: Internal Medicine

## 2021-05-22 ENCOUNTER — Ambulatory Visit: Payer: Medicare Other

## 2021-05-22 NOTE — Progress Notes (Signed)
Patient on schedule for Port removal 05/29/2021, called and spoke with patient on phone with pre procedure instructions given. Made aware to be here @ 0830, NPO after MN prior to procedure, and driver post procedure/recovery/discharge. Stated understanding.

## 2021-05-28 ENCOUNTER — Other Ambulatory Visit: Payer: Self-pay | Admitting: Radiology

## 2021-05-29 ENCOUNTER — Other Ambulatory Visit: Payer: Self-pay

## 2021-05-29 ENCOUNTER — Ambulatory Visit
Admission: RE | Admit: 2021-05-29 | Discharge: 2021-05-29 | Disposition: A | Discharge status: Home / Self Care | Payer: Medicare Other | Source: Ambulatory Visit | Attending: Internal Medicine | Admitting: Internal Medicine

## 2021-05-29 DIAGNOSIS — Z881 Allergy status to other antibiotic agents status: Secondary | ICD-10-CM | POA: Insufficient documentation

## 2021-05-29 DIAGNOSIS — Z452 Encounter for adjustment and management of vascular access device: Secondary | ICD-10-CM | POA: Insufficient documentation

## 2021-05-29 DIAGNOSIS — Z8572 Personal history of non-Hodgkin lymphomas: Secondary | ICD-10-CM | POA: Diagnosis not present

## 2021-05-29 DIAGNOSIS — F17210 Nicotine dependence, cigarettes, uncomplicated: Secondary | ICD-10-CM | POA: Diagnosis not present

## 2021-05-29 DIAGNOSIS — Z79899 Other long term (current) drug therapy: Secondary | ICD-10-CM | POA: Diagnosis not present

## 2021-05-29 DIAGNOSIS — C8330 Diffuse large B-cell lymphoma, unspecified site: Secondary | ICD-10-CM

## 2021-05-29 DIAGNOSIS — F17290 Nicotine dependence, other tobacco product, uncomplicated: Secondary | ICD-10-CM | POA: Insufficient documentation

## 2021-05-29 DIAGNOSIS — Z9221 Personal history of antineoplastic chemotherapy: Secondary | ICD-10-CM | POA: Diagnosis not present

## 2021-05-29 HISTORY — PX: IR REMOVAL TUN ACCESS W/ PORT W/O FL MOD SED: IMG2290

## 2021-05-29 MED ORDER — SODIUM CHLORIDE 0.9 % IV SOLN: INTRAVENOUS | Status: DC

## 2021-05-29 MED ORDER — FENTANYL CITRATE (PF) 100 MCG/2ML IJ SOLN
INTRAMUSCULAR | Status: AC
  Filled 2021-05-29: qty 2

## 2021-05-29 MED ORDER — MIDAZOLAM HCL 5 MG/5ML IJ SOLN
INTRAMUSCULAR | Status: AC | PRN
  Administered 2021-05-29 (×2): 1 mg via INTRAVENOUS

## 2021-05-29 MED ORDER — FENTANYL CITRATE (PF) 100 MCG/2ML IJ SOLN
INTRAMUSCULAR | Status: AC | PRN
  Administered 2021-05-29 (×2): 25 ug via INTRAVENOUS

## 2021-05-29 MED ORDER — MIDAZOLAM HCL 5 MG/5ML IJ SOLN
INTRAMUSCULAR | Status: AC
  Filled 2021-05-29: qty 5

## 2021-05-29 NOTE — H&P (Signed)
Chief Complaint: Completion of chemotherapy. Request is for portacath removal.  Referring Physician(s): Cammie Sickle  Supervising Physician: Aletta Edouard  Patient Status: [facility] - Out-pt  History of Present Illness: Eric [name] is a 70 y.o. male History of B cell lymphoma. IR placed a RIJ porta cath on 9.16.2020.Patient has since completed treatment. Patient presents for portacath removal.   Wife at bedside. Currently without any significant complaints. Patient alert and laying in bed, calm and comfortable. Denies any fevers, headache, chest pain, SOB, cough, abdominal pain, nausea, vomiting or bleeding. Discussed care of portacath removal with patient and his wife. All questions were answered and concerns were addressed. Patient and wife agree with plan.      Past Medical History:  Diagnosis Date   Adopted person    Colon polyps    Pancreatitis    Rectal bleeding 02/24/14   Sepsis ([place]) 09/18/2019    Past Surgical History:  Procedure Laterality Date   CHOLECYSTECTOMY     COLONOSCOPY  02/28/14   IR IMAGING GUIDED PORT INSERTION  07/18/2019   LYMPH NODE BIOPSY Left 03/28/2020   Procedure: EXCISION OF SUBMANDIBULAR  LYMPH NODE;  Surgeon: Carloyn Manner, MD;  Location: [facility] [place];  Service: ENT;  Laterality: Left;   POLYPECTOMY     colon polyp removed   TONSILLECTOMY      Allergies: Allopurinol, Azithromycin, and Losartan potassium  Medications: Prior to Admission medications   Medication Sig Start Date End Date Taking? Authorizing Provider  carvedilol (COREG) 6.25 MG tablet TAKE ONE TABLET BY MOUTH TWICE DAILY 09/10/20  Yes Gollan, Kathlene November, MD  Multiple Vitamin (MULTI-VITAMIN) tablet Take 1 tablet by mouth daily.   Yes [provider]  sulfamethoxazole-trimethoprim (BACTRIM DS) 800-160 MG tablet Take 1 tablet by mouth 2 (two) times daily. Take it on Monday ;wed; Friday 08/15/20  Yes Cammie Sickle, MD  valACYclovir (VALTREX)  500 MG tablet Take 500 mg by mouth daily. 04/24/21  Yes [provider]  lidocaine-prilocaine (EMLA) cream Apply generously on the port 30-45 prior to accessing the port. 03/10/20   Cammie Sickle, MD  UNABLE TO FIND Med Name: CBD oil Patient not taking: No sig reported    [provider]     Family History  Adopted: Yes    Social History   Socioeconomic History   Marital status: Married    Spouse name: Not on file   Number of children: Not on file   Years of education: Not on file   Highest education level: Not on file  Occupational History    Comment: Retired from [employer]   Smoking status: Every Day    Packs/day: 1.00    Years: 20.00    Pack years: 20.00    Types: Cigars, Cigarettes   Smokeless tobacco: Never   Tobacco comments:    smoke 1/2 ppd cigaretes since 70 yo. changed to cigars at age July 2004.   Vaping Use   Vaping Use: Never used  Substance and Sexual Activity   Alcohol use: Yes    Comment: 6-7 drinks weekly   Drug use: No   Sexual activity: Not on file  Other Topics Concern   Not on file  Social History Narrative   Lives at home with wife; in [place]. retd from [employer] smoker; drinks liquor. Children - grown up.    Social Determinants of Health   Financial Resource Strain: Not on file  Food Insecurity: Not  on file  Transportation Needs: Not on file  Physical Activity: Not on file  Stress: Not on file  Social Connections: Not on file    Review of Systems: A 12 point ROS discussed and pertinent positives are indicated in the HPI above.  All other systems are negative.  Review of Systems  Constitutional:  Negative for fever.  HENT:  Negative for congestion.   Respiratory:  Negative for cough and shortness of breath.   Cardiovascular:  Negative for chest pain.  Gastrointestinal:  Negative for abdominal pain.  Neurological:  Negative for headaches.  Psychiatric/Behavioral:  Negative for behavioral  problems and confusion.    Vital Signs: BP 110/73 (BP Location: Left Arm)   Pulse 65   Temp 98.2 F (36.8 C) (Oral)   Resp 15   Ht 6' (1.829 m)   Wt 285 lb (129.3 kg)   SpO2 100%   BMI 38.65 kg/m   Physical Exam Vitals and nursing note reviewed.  Constitutional:      Appearance: He is well-developed.  HENT:     Head: Normocephalic.  Cardiovascular:     Rate and Rhythm: Normal rate and regular rhythm.     Heart sounds: Normal heart sounds.     Comments: Single lumen right sided portacath not access present. Pulmonary:     Effort: Pulmonary effort is normal.     Breath sounds: Normal breath sounds.  Musculoskeletal:        General: Normal range of motion.     Cervical back: Normal range of motion.  Skin:    General: Skin is dry.  Neurological:     Mental Status: He is alert and oriented to person, place, and time.    Imaging: No results found.  Labs:  CBC: Recent Labs    01/08/21 0903 01/22/21 1004 02/11/21 0945 05/13/21 1007  WBC 4.4 3.5* 3.9* 3.5*  HGB 11.5* 11.0* 12.0* 12.6*  HCT 32.6* 30.6* 34.2* 36.0*  PLT 165 126* 141* 142*    COAGS: No results for input(s): INR, APTT in the last 8760 hours.  BMP: Recent Labs    05/30/20 0830 06/06/20 0859 06/23/20 0933 08/15/20 0937 01/08/21 0903 01/22/21 1004 02/11/21 0945 05/13/21 1007  NA 138 139 140   < > 141 137 140 140  K 3.8 4.0 3.8   < > 4.0 3.6 4.1 3.5  CL 103 101 105   < > 105 103 105 105  CO2 '26 28 27   '$ < > '29 25 26 25  '$ GLUCOSE 136* 131* 119*   < > 93 107* 102* 122*  BUN '17 23 13   '$ < > '18 21 20 17  '$ CALCIUM 8.8* 8.9 8.7*   < > 9.0 8.7* 9.1 8.6*  CREATININE 0.86 0.80 0.92   < > 0.93 0.96 1.00 0.94  GFRNONAA >60 >60 >60   < > >60 >60 >60 >60  GFRAA >60 >60 >60  --   --   --   --   --    < > = values in this interval not displayed.    LIVER FUNCTION TESTS: Recent Labs    08/15/20 0937 11/13/20 0944 02/11/21 0945 05/13/21 1007  BILITOT 2.1* 1.2 0.9 0.7  AST 32 '28 25 22  '$ ALT '25 31 23  21  '$ ALKPHOS 68 49 69 84  PROT 6.3* 5.8* 6.5 6.2*  ALBUMIN 4.1 4.3 4.5 4.1      Assessment and Plan:  70 y.o. male outpatient. History of B celI lymphoma.  IR placed a RIJ portacath on 9.16.2020. Patient has since completed treatment. Patient presents for portacath removal.   No pertinent medications. All labs from 7.13.22 unremarkable. Allergies include azithromycin. Patient has been NPO since midnight .  Risks and benefits of image guided port-a-catheter removal was discussed with the patient including, but not limited to bleeding, infection, pneumothorax, or fibrin sheath development and need for additional procedures.  All of the patient's questions were answered, patient is agreeable to proceed. Consent signed and in chart.  Thank you for this interesting consult.  I greatly enjoyed meeting MEGUEL HELMING and look forward to participating in their care.  A copy of this report was sent to the requesting provider on this date.  Electronically Signed: Jacqualine Mau, NP 05/29/2021, 12:12 PM   I spent a total of  30 Minutes   in face to face in clinical consultation, greater than 50% of which was counseling/coordinating care for portacath removal

## 2021-05-29 NOTE — Procedures (Signed)
Interventional Radiology Procedure Note  Procedure: Port removal  Complications: None  Estimated Blood Loss: < 10 mL  Findings: Right chest port removed in entirety using sharp and blunt dissection. Pocket clean, non-infected. Wound closed and Dermabond applied.  Venetia Night. Kathlene Cote, M.D Pager:  [phone]

## 2021-07-24 ENCOUNTER — Telehealth: Payer: Self-pay | Admitting: *Deleted

## 2021-07-24 ENCOUNTER — Encounter: Payer: Self-pay | Admitting: Internal Medicine

## 2021-07-24 NOTE — Telephone Encounter (Signed)
Pt sent mychart msg and requested that apts on 10/12 be moved to the am.

## 2021-08-11 ENCOUNTER — Other Ambulatory Visit: Payer: Self-pay

## 2021-08-11 ENCOUNTER — Inpatient Hospital Stay: Payer: Medicare Other | Admitting: Internal Medicine

## 2021-08-11 ENCOUNTER — Inpatient Hospital Stay: Payer: Medicare Other | Attending: Internal Medicine

## 2021-08-11 ENCOUNTER — Encounter: Payer: Self-pay | Admitting: Internal Medicine

## 2021-08-11 VITALS — BP 122/75 | HR 67 | Temp 97.0°F | Resp 20 | Wt 287.6 lb

## 2021-08-11 DIAGNOSIS — C8330 Diffuse large B-cell lymphoma, unspecified site: Secondary | ICD-10-CM

## 2021-08-11 DIAGNOSIS — D696 Thrombocytopenia, unspecified: Secondary | ICD-10-CM | POA: Diagnosis not present

## 2021-08-11 DIAGNOSIS — C8590 Non-Hodgkin lymphoma, unspecified, unspecified site: Secondary | ICD-10-CM

## 2021-08-11 DIAGNOSIS — E669 Obesity, unspecified: Secondary | ICD-10-CM | POA: Insufficient documentation

## 2021-08-11 DIAGNOSIS — Z8572 Personal history of non-Hodgkin lymphomas: Secondary | ICD-10-CM | POA: Insufficient documentation

## 2021-08-11 DIAGNOSIS — D649 Anemia, unspecified: Secondary | ICD-10-CM | POA: Diagnosis not present

## 2021-08-11 LAB — CBC WITH DIFFERENTIAL/PLATELET
Abs Immature Granulocytes: 0.05 10*3/uL (ref 0.00–0.07)
Basophils Absolute: 0 10*3/uL (ref 0.0–0.1)
Basophils Relative: 1 %
Eosinophils Absolute: 0.2 10*3/uL (ref 0.0–0.5)
Eosinophils Relative: 4 %
HCT: 40.9 % (ref 39.0–52.0)
Hemoglobin: 13.8 g/dL (ref 13.0–17.0)
Immature Granulocytes: 1 %
Lymphocytes Relative: 8 %
Lymphs Abs: 0.4 10*3/uL — ABNORMAL LOW (ref 0.7–4.0)
MCH: 33.4 pg (ref 26.0–34.0)
MCHC: 33.7 g/dL (ref 30.0–36.0)
MCV: 99 fL (ref 80.0–100.0)
Monocytes Absolute: 0.4 10*3/uL (ref 0.1–1.0)
Monocytes Relative: 7 %
Neutro Abs: 4 10*3/uL (ref 1.7–7.7)
Neutrophils Relative %: 79 %
Platelets: 157 10*3/uL (ref 150–400)
RBC: 4.13 MIL/uL — ABNORMAL LOW (ref 4.22–5.81)
RDW: 15.1 % (ref 11.5–15.5)
WBC: 5 10*3/uL (ref 4.0–10.5)
nRBC: 0 % (ref 0.0–0.2)

## 2021-08-11 LAB — BASIC METABOLIC PANEL
Anion gap: 8 (ref 5–15)
BUN: 20 mg/dL (ref 8–23)
CO2: 27 mmol/L (ref 22–32)
Calcium: 8.9 mg/dL (ref 8.9–10.3)
Chloride: 104 mmol/L (ref 98–111)
Creatinine, Ser: 1.02 mg/dL (ref 0.61–1.24)
GFR, Estimated: >60 mL/min (ref >60)
Glucose, Bld: 122 mg/dL — ABNORMAL HIGH (ref 70–99)
Potassium: 4.2 mmol/L (ref 3.5–5.1)
Sodium: 139 mmol/L (ref 135–145)

## 2021-08-11 LAB — LACTATE DEHYDROGENASE: LDH: 129 U/L (ref 98–192)

## 2021-08-11 NOTE — Progress Notes (Signed)
Pt is wondering if he can start using the vocbulary "cured" if not, what can/needs to be done.

## 2021-08-11 NOTE — Assessment & Plan Note (Addendum)
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  May 2022- [facility] PET-  No abnormal uptake to suggest metabolically active disease. Deauville 1; Similar splenomegaly. Awaiting repeat CT scan after christmas, 2022. STABLE.   #Mild anemia/mild thrombocytopenia-lymphopenia likely secondary to CAR-T-improving.  Absolute lymphocyte count 400-hemoglobin/platelets-normal.  # Borderline LOW EF of 45-50%; Sep Echo 50-55% [Dr.Gollan]- on coreg BID -stable  # Antimicrobial prophylaxis-continue Bactrim/acyclovir- STABLE [until CD 4 count > 200].   Obesity: again re-iterated the importance of eight loss; nutritional recommendations/low-carb diet.   # DISPOSITION:  # follow up 4 months- MD; labs- cbc/cmp/ldh;Dr.B

## 2021-08-11 NOTE — Progress Notes (Signed)
Eric [name] NOTE  Patient Care Team: [name], MD as PCP - General (Family Medicine) [name], [name], MD (General Surgery) Cammie Sickle, MD as Consulting Physician (Internal Medicine)  CHIEF COMPLAINTS/PURPOSE OF CONSULTATION: Diffuse large B-cell lymphoma  #  Oncology History Overview Note  #June 29, 2019-CT- bulky bilateral neck adenopathy left more than right; September 2020-Diffuse B-cell lymphoma; ABC subtype; NEGATIVE FISH studies.  PET scan -bulky adenopathy involving neck /lymphadenopathy mediastinum /abdomen/pelvis inguinal lymph nodes; splenic involvement.  Question bone.  Stage IV [spleen involvement; no bone marrow biopsy].  BCL-2: Positive /BCL 6: Positive /MUM-1: Positive /CD10: Negative 'Ki-67: High proliferation index (90%) /CD30: Negative /Cyclin D1: Negative C-MYC: Equivocal (20% staining seen on limited and fragmented tissue) /EBER: Negative   # Ritux-Pred- 9/11; 9/18- R-COP [held adria- sec to EF 45-50%]  #May 2021-PET scan left neck recurrent disease; excisional biopsy [Dr. Vaught]-recurrent diffuse left B-cell lymphoma  #November 2020-bilateral PE; on xarelto  #J07/07/2020- 2021- Gem-CarboDex [II opinion [facility]; Dr. Thera Flake; stem cell transplant eval]; no Rituxan-repeat CD20 negative.   #S/p 2 cycles-gem carbo Dex-June 18, 2020 PET scan-slight increase in SUV uptake of the left cervical lymph nodes; no distant metastatic disease.  Discontinued chemotherapy; refer to CAR-T;  OCT 12th 2021- [facility]- -New extensive cervical, supraclavicular, intrathoracic, axillary and abdominal and pelvic lymph nodes, worrisome for lymphoma involvement. New splenomegaly with new, diffuse uptake, now more intense than the liver, concerning for lymphoma involvement. S/p POLA-BENDA  # CAR-T/Axi-cel on 09/16/20 preceded by fludarabine/cyclophosphamide lymphodepletion.[[facility]; Dr.Grover]; complicated by CRS-G-3 [ICU]; dec 27th-PET CR.   # Sep 2020-  Borderline low EF/Left ventricular hypokinesis [Dr.Gollan]-s October 9th stress test-nonischemic;   # DIAGNOSIS: DLBCL  STAGE:   4      ;GOALS: Cure  CURRENT/MOST RECENT THERAPY : R-COP/-R-CEOP. [C]    Lymphoma of lymph nodes ([place])  07/03/2019 Initial Diagnosis   Lymphoma of lymph nodes ([place])   07/13/2019 - 12/07/2019 Chemotherapy   The patient had palonosetron (ALOXI) injection 0.25 mg, 0.25 mg, Intravenous,  Once, 7 of 7 cycles Administration: 0.25 mg (07/20/2019), 0.25 mg (08/13/2019), 0.25 mg (11/13/2019), 0.25 mg (09/03/2019), 0.25 mg (10/02/2019), 0.25 mg (10/23/2019), 0.25 mg (12/04/2019) pegfilgrastim (NEULASTA ONPRO KIT) injection 6 mg, 6 mg, Subcutaneous, Once, 2 of 2 cycles Administration: 6 mg (10/04/2019), 6 mg (10/25/2019) pegfilgrastim-cbqv (UDENYCA) injection 6 mg, 6 mg, Subcutaneous, Once, 5 of 5 cycles Administration: 6 mg (07/23/2019), 6 mg (08/16/2019), 6 mg (09/06/2019), 6 mg (11/16/2019), 6 mg (12/07/2019) vinCRIStine (ONCOVIN) 2 mg in sodium chloride 0.9 % 50 mL chemo infusion, 2 mg, Intravenous,  Once, 7 of 7 cycles Administration: 2 mg (07/20/2019), 2 mg (08/13/2019), 2 mg (11/13/2019), 2 mg (09/03/2019), 2 mg (10/02/2019), 2 mg (10/23/2019), 2 mg (12/04/2019) cyclophosphamide (CYTOXAN) 1,900 mg in sodium chloride 0.9 % 250 mL chemo infusion, 750 mg/m2 = 1,900 mg, Intravenous,  Once, 7 of 7 cycles Administration: 1,900 mg (07/20/2019), 1,900 mg (08/13/2019), 2,000 mg (11/13/2019), 1,900 mg (09/03/2019), 2,000 mg (10/02/2019), 2,000 mg (10/23/2019), 2,000 mg (12/04/2019) etoposide (VEPESID) 250 mg in sodium chloride 0.9 % 1,000 mL chemo infusion, 100 mg/m2 = 250 mg, Intravenous,  Once, 6 of 6 cycles Dose modification: 50 mg/m2 (original dose 100 mg/m2, Cycle 3, Reason: Provider Judgment) Administration: 130 mg (08/14/2019), 130 mg (08/15/2019), 130 mg (09/03/2019), 130 mg (09/04/2019), 130 mg (09/05/2019), 130 mg (10/03/2019), 130 mg (10/04/2019), 130 mg (10/24/2019), 130 mg (10/25/2019), 130 mg  (11/13/2019), 130 mg (11/14/2019), 130 mg (11/15/2019), 130 mg (10/02/2019), 130 mg (10/23/2019),  130 mg (12/04/2019), 130 mg (12/05/2019), 130 mg (12/06/2019) dexamethasone (DECADRON) 12 mg in sodium chloride 0.9 % 145 mL IVPB, , Intravenous,  Once, 8 of 8 cycles Administration:  (07/13/2019),  (07/20/2019),  (08/13/2019),  (11/13/2019),  (09/03/2019),  (10/02/2019),  (10/23/2019),  (12/04/2019) riTUXimab-pvvr (RUXIENCE) 900 mg in sodium chloride 0.9 % 250 mL (2.6471 mg/mL) infusion, 375 mg/m2 = 900 mg (100 % of original dose 375 mg/m2), Intravenous,  Once, 1 of 1 cycle Dose modification: 375 mg/m2 (original dose 375 mg/m2, Cycle 1) Administration: 900 mg (07/13/2019)   for chemotherapy treatment.     05/09/2020 - 06/06/2020 Chemotherapy   The patient had palonosetron (ALOXI) injection 0.25 mg, 0.25 mg, Intravenous,  Once, 2 of 4 cycles Administration: 0.25 mg (05/09/2020), 0.25 mg (05/30/2020) CARBOplatin (PARAPLATIN) 750 mg in sodium chloride 0.9 % 250 mL chemo infusion, 750 mg (100 % of original dose 750 mg), Intravenous,  Once, 2 of 4 cycles Dose modification:   (original dose 750 mg, Cycle 1, Reason: Provider Judgment, Comment: labs from care every where. ) Administration: 750 mg (05/09/2020), 750 mg (05/30/2020) gemcitabine (GEMZAR) 2,500 mg in sodium chloride 0.9 % 250 mL chemo infusion, 2,584 mg, Intravenous,  Once, 2 of 4 cycles Administration: 2,500 mg (05/09/2020), 2,500 mg (05/16/2020), 2,500 mg (05/30/2020), 2,500 mg (06/06/2020) fosaprepitant (EMEND) 150 mg in sodium chloride 0.9 % 145 mL IVPB, 150 mg, Intravenous,  Once, 2 of 4 cycles Administration: 150 mg (05/09/2020), 150 mg (05/30/2020)   for chemotherapy treatment.     08/27/2020 -  Chemotherapy   The patient had dexamethasone (DECADRON) 4 MG tablet, 8 mg, Oral, Daily, 0 of 1 cycle, Start date: --, End date: -- palonosetron (ALOXI) injection 0.25 mg, 0.25 mg, Intravenous,  Once, 0 of 6 cycles pegfilgrastim-cbqv (UDENYCA) injection 6 mg, 6 mg,  Subcutaneous, Once, 0 of 6 cycles bendamustine (BENDEKA) 250 mg in sodium chloride 0.9 % 50 mL (4.1667 mg/mL) chemo infusion, 90 mg/m2, Intravenous,  Once, 0 of 6 cycles riTUXimab-pvvr (RUXIENCE) 1,000 mg in sodium chloride 0.9 % 250 mL (2.8571 mg/mL) infusion, 375 mg/m2 = 1,000 mg, Intravenous,  Once, 0 of 6 cycles polatuzumab vedotin-piiq (POLIVY) 246 mg in sodium chloride 0.9 % 100 mL (2.1906 mg/mL) chemo infusion, 1.8 mg/kg, Intravenous,  Once, 0 of 6 cycles   for chemotherapy treatment.     11/13/2020 Cancer Staging   Staging form: Hodgkin and Non-Hodgkin Lymphoma, AJCC 8th Edition - Clinical: Stage IV (Diffuse large B-cell lymphoma) - Signed by Cammie Sickle, MD on 11/13/2020     HISTORY OF PRESENTING ILLNESS: Ambulating independently.  Alone.  Eric [name] 70 y.o.  male relapsed/refractory diffuse large B-cell lymphoma currently s/p CART therapy at [facility] in November 2021 is here for follow-up.   Patient's PET scan May 2022-continued response to lymphadenopathy.  No new lumps or bumps.  Appetite is good.  No weight loss.  No fever no chills.  Review of Systems  Constitutional:  Negative for chills, diaphoresis and fever.  HENT:  Positive for hearing loss. Negative for nosebleeds and sore throat.   Eyes:  Negative for double vision.  Respiratory:  Negative for hemoptysis, sputum production and wheezing.   Cardiovascular:  Negative for chest pain, palpitations, orthopnea and leg swelling.  Gastrointestinal:  Negative for abdominal pain, blood in stool, constipation, diarrhea, heartburn, melena, nausea and vomiting.  Genitourinary:  Negative for dysuria, frequency and urgency.  Musculoskeletal:  Negative for back pain and joint pain.  Skin: Negative.  Negative for itching and rash.  Neurological:  Negative for dizziness, tingling, focal weakness, weakness and headaches.  Endo/Heme/Allergies:  Does not bruise/bleed easily.  Psychiatric/Behavioral:  Negative for depression. The  patient does not have insomnia.     MEDICAL HISTORY:  Past Medical History:  Diagnosis Date   Adopted person    Colon polyps    Pancreatitis    Rectal bleeding 02/24/14   Sepsis ([place]) 09/18/2019    SURGICAL HISTORY: Past Surgical History:  Procedure Laterality Date   CHOLECYSTECTOMY     COLONOSCOPY  02/28/14   IR IMAGING GUIDED PORT INSERTION  07/18/2019   IR REMOVAL TUN ACCESS W/ PORT W/O FL MOD SED  05/29/2021   LYMPH NODE BIOPSY Left 03/28/2020   Procedure: EXCISION OF SUBMANDIBULAR  LYMPH NODE;  Surgeon: Carloyn Manner, MD;  Location: [place];  Service: ENT;  Laterality: Left;   POLYPECTOMY     colon polyp removed   TONSILLECTOMY      SOCIAL HISTORY: Social History   Socioeconomic History   Marital status: Married    Spouse name: Not on file   Number of children: Not on file   Years of education: Not on file   Highest education level: Not on file  Occupational History    Comment: Retired from [employer]   Smoking status: Every Day    Packs/day: 1.00    Years: 20.00    Pack years: 20.00    Types: Cigars, Cigarettes   Smokeless tobacco: Never   Tobacco comments:    smoke 1/2 ppd cigaretes since 70 yo. changed to cigars at age July 2004.   Vaping Use   Vaping Use: Never used  Substance and Sexual Activity   Alcohol use: Yes    Comment: 6-7 drinks weekly   Drug use: No   Sexual activity: Not on file  Other Topics Concern   Not on file  Social History Narrative   Lives at home with wife; in [place]. retd from [employer] smoker; drinks liquor. Children - grown up.    Social Determinants of Health   Financial Resource Strain: Not on file  Food Insecurity: Not on file  Transportation Needs: Not on file  Physical Activity: Not on file  Stress: Not on file  Social Connections: Not on file  Intimate Partner Violence: Not on file    FAMILY HISTORY: Family History  Adopted: Yes    ALLERGIES:  is allergic to allopurinol,  azithromycin, and losartan potassium.  MEDICATIONS:  Current Outpatient Medications  Medication Sig Dispense Refill   carvedilol (COREG) 6.25 MG tablet TAKE ONE TABLET BY MOUTH TWICE DAILY 180 tablet 3   Multiple Vitamin (MULTI-VITAMIN) tablet Take 1 tablet by mouth daily.     sulfamethoxazole-trimethoprim (BACTRIM DS) 800-160 MG tablet Take 1 tablet by mouth 2 (two) times daily. Take it on Monday ;wed; Friday 30 tablet 1   valACYclovir (VALTREX) 500 MG tablet Take 500 mg by mouth daily.     UNABLE TO FIND Med Name: CBD oil (Patient not taking: No sig reported)     No current facility-administered medications for this visit.      Marland Kitchen  PHYSICAL EXAMINATION: ECOG PERFORMANCE STATUS: 1 - Symptomatic but completely ambulatory  Vitals:   08/11/21 0923  BP: 122/75  Pulse: 67  Resp: 20  Temp: (!) 97 F (36.1 C)  SpO2: 100%   Filed Weights   08/11/21 0923  Weight: 287 lb 9.6 oz (130.5 kg)    Physical Exam HENT:  Head: Normocephalic and atraumatic.     Mouth/Throat:     Pharynx: No oropharyngeal exudate.  Eyes:     Pupils: Pupils are equal, round, and reactive to light.  Cardiovascular:     Rate and Rhythm: Normal rate and regular rhythm.  Pulmonary:     Effort: Pulmonary effort is normal. No respiratory distress.     Breath sounds: Normal breath sounds. No wheezing.  Abdominal:     General: Bowel sounds are normal. There is no distension.     Palpations: Abdomen is soft. There is no mass.     Tenderness: There is no abdominal tenderness. There is no guarding or rebound.  Musculoskeletal:        General: No tenderness. Normal range of motion.     Cervical back: Normal range of motion and neck supple.  Skin:    General: Skin is warm.  Neurological:     Mental Status: He is alert and oriented to person, place, and time.  Psychiatric:        Mood and Affect: Affect normal.   LABORATORY DATA:  I have reviewed the data as listed Lab Results  Component Value Date    WBC 5.0 08/11/2021   HGB 13.8 08/11/2021   HCT 40.9 08/11/2021   MCV 99.0 08/11/2021   PLT 157 08/11/2021   Recent Labs    11/13/20 0944 11/27/20 0954 02/11/21 0945 05/13/21 1007 08/11/21 0905  NA 140   < > 140 140 139  K 3.9   < > 4.1 3.5 4.2  CL 105   < > 105 105 104  CO2 26   < > _0 GLUCOSE 92   < > 102* 122* 122*  BUN 18   < > _1 CREATININE 1.02   < > 1.00 0.94 1.02  CALCIUM 9.2   < > 9.1 8.6* 8.9  GFRNONAA >60   < > >60 >60 >60  PROT 5.8*  --  6.5 6.2*  --   ALBUMIN 4.3  --  4.5 4.1  --   AST 28  --  25 22  --   ALT 31  --  23 21  --   ALKPHOS 49  --  69 84  --   BILITOT 1.2  --  0.9 0.7  --    < > = values in this interval not displayed.    RADIOGRAPHIC STUDIES: I have personally reviewed the radiological images as listed and agreed with the findings in the report. No results found.  ASSESSMENT & PLAN:   Lymphoma of lymph nodes ([place]) # Chemo refractory/ Relapsed diffuse large B-cell lymphoma-ABC subtype; IV [spleen]; NOV 2021- CART therapy.  May 2022- [facility] PET-  No abnormal uptake to suggest metabolically active disease. [place] 1; Similar splenomegaly. Awaiting repeat CT scan after christmas, 2022. STABLE.   #Mild anemia/mild thrombocytopenia-lymphopenia likely secondary to CAR-T-improving.  Absolute lymphocyte count 400-hemoglobin/platelets-normal.  # Borderline LOW EF of 45-50%; Sep Echo 50-55% [Dr.Gollan]- on coreg BID -stable  # Antimicrobial prophylaxis-continue Bactrim/acyclovir- STABLE [until CD 4 count > 200].   Obesity: again re-iterated the importance of eight loss; nutritional recommendations/low-carb diet.   # DISPOSITION:  # follow up 4 months- MD; labs- cbc/cmp/ldh;Dr.B   All questions were answered. The patient knows to call the clinic with any problems, questions or concerns.    Cammie Sickle, MD 08/11/2021 12:19 PM

## 2021-08-12 ENCOUNTER — Other Ambulatory Visit: Payer: Medicare Other

## 2021-08-12 ENCOUNTER — Ambulatory Visit: Payer: Medicare Other | Admitting: Internal Medicine

## 2021-09-28 ENCOUNTER — Other Ambulatory Visit: Payer: Self-pay | Admitting: Internal Medicine

## 2021-09-29 ENCOUNTER — Encounter: Payer: Self-pay | Admitting: Internal Medicine

## 2021-09-30 ENCOUNTER — Encounter: Payer: Self-pay | Admitting: Internal Medicine

## 2021-10-09 ENCOUNTER — Other Ambulatory Visit: Payer: Self-pay | Admitting: Cardiovascular Disease

## 2021-10-09 NOTE — Telephone Encounter (Signed)
Needs office visit for refills. Thank you!

## 2021-10-12 ENCOUNTER — Encounter: Payer: Self-pay | Admitting: Cardiovascular Disease

## 2021-10-12 ENCOUNTER — Ambulatory Visit: Payer: Medicare Other | Admitting: Cardiovascular Disease

## 2021-10-12 ENCOUNTER — Other Ambulatory Visit: Payer: Self-pay

## 2021-10-12 VITALS — BP 120/60 | HR 67 | Ht 72.0 in | Wt 289.5 lb

## 2021-10-12 DIAGNOSIS — R0602 Shortness of breath: Secondary | ICD-10-CM

## 2021-10-12 DIAGNOSIS — C8590 Non-Hodgkin lymphoma, unspecified, unspecified site: Secondary | ICD-10-CM | POA: Diagnosis not present

## 2021-10-12 DIAGNOSIS — I420 Dilated cardiomyopathy: Secondary | ICD-10-CM

## 2021-10-12 DIAGNOSIS — I2699 Other pulmonary embolism without acute cor pulmonale: Secondary | ICD-10-CM

## 2021-10-12 DIAGNOSIS — F17200 Nicotine dependence, unspecified, uncomplicated: Secondary | ICD-10-CM | POA: Diagnosis not present

## 2021-10-12 DIAGNOSIS — Z13220 Encounter for screening for lipoid disorders: Secondary | ICD-10-CM | POA: Diagnosis not present

## 2021-10-12 DIAGNOSIS — R29818 Other symptoms and signs involving the nervous system: Secondary | ICD-10-CM

## 2021-10-12 MED ORDER — CARVEDILOL 6.25 MG PO TABS: 6.2500 mg | ORAL_TABLET | Freq: Two times a day (BID) | ORAL | 3 refills | Status: DC

## 2021-10-12 NOTE — Patient Instructions (Addendum)
Medication Instructions:  No changes  If you need a refill on your cardiac medications before your next appointment, please call your pharmacy.    Lab work: lipids  Testing/Procedures: No new testing needed  Follow-Up: At [facility], you and your health needs are our priority.  As part of our continuing mission to provide you with exceptional heart care, we have created designated Provider Care Teams.  These Care Teams include your primary Cardiologist (physician) and Advanced Practice Providers (APPs -  Physician Assistants and Nurse Practitioners) who all work together to provide you with the care you need, when you need it.  You will need a follow up appointment in 12 months  Providers on your designated Care Team:   Murray Hodgkins, NP Christell Faith, PA-C Cadence Kathlen Mody, [place]  COVID-19 Vaccine Information can be found at: [email or web address].uk For questions related to vaccine distribution or appointments, please email [email or web address] or call 2070760650.

## 2021-10-12 NOTE — Progress Notes (Signed)
Cardiology Office Note  Date:  10/12/2021   ID:  [name], [name] 1951/06/23, MRN [id number]  PCP:  [name], MD   Chief Complaint  Patient presents with   12 month follow up     "Doing well." Medications reviewed by the patient verbally.     HPI:  [name] is a 70 year old gentleman with past medical history of Smoker, 20+ years diabetes Chronic lower extremity edema Diffuse large B-cell lymphoma, on prednisone Rituxan cardiomyopathy, ejection fraction 45 to 50% on echocardiogram September 2020 Follow-up echocardiogram November 2020 ejection fraction 55 to 60% started on t R-COP hospital November 2020 for pulmonary embolism Presents for follow-up of his cardiomyopathy,  DVT and bilateral PE with respiratory distress  Last seen in clinic by myself May 2021  refractory diffuse large B cell lymphoma. Completed 6 months chemo Referral to [facility] for treatment Additional therapy, CAR-T cells, preceded by fludarabine/cyclophosphamide lymphodepletion 18 days in hospital,strep mitis bacteremia  Complete remission at this time PET/CT at 6 months post treatment showing complete response to therapy   Echo 08/07/20 with EF 50-55% Echo 11/21: EF 50%  Overall feels well no complaints Had a vaccine Smokes cigars,  Sits on porch No regular exercise Weight has been trending upwards, poor diet  Has follow-up with hematology oncology PET scan scheduled  Off xarelto more recently on lovenox which was held in the setting cytopenias. Oncology recommended continuing to hold [facility] at time of last visit.  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  Chronic lower extremity swelling right greater than left  EKG personally reviewed by myself on todays visit Shows normal sinus rhythm with rate 68 bpm no significant ST-T wave changes No change  Other past medical history reviewed Adriamycin was previously not started secondary  to mildly  depressed EF of 45-50% Adriamycin substitured for etoposide   Seen in the hospital November 2020 for pulmonary embolism Started on Xarelto 15 twice daily changed to 20 mg daily December 10  Reports that he had a massage, shortly after he developed severe sharp left-sided pleuritic chest pain as well as shortness of breath  Seen in the hospital, was hypoxic requiring 4 L nasal cannula CT scan was positive for bilateral pulmonary embolus.    Lower extremity venous Doppler results reviewed with him 1. Nonocclusive thrombus in the right popliteal vein, right posterior tibial vein and right peroneal vein.   PMH:   has a past medical history of Adopted person, Colon polyps, Pancreatitis, Rectal bleeding (02/24/14), and Sepsis ([place]) (09/18/2019).  PSH:    Past Surgical History:  Procedure Laterality Date   CHOLECYSTECTOMY     COLONOSCOPY  02/28/14   IR IMAGING GUIDED PORT INSERTION  07/18/2019   IR REMOVAL TUN ACCESS W/ PORT W/O FL MOD SED  05/29/2021   LYMPH NODE BIOPSY Left 03/28/2020   Procedure: EXCISION OF SUBMANDIBULAR  LYMPH NODE;  Surgeon: Carloyn Manner, MD;  Location: [place];  Service: ENT;  Laterality: Left;   POLYPECTOMY     colon polyp removed   TONSILLECTOMY      Current Outpatient Medications  Medication Sig Dispense Refill   carvedilol (COREG) 6.25 MG tablet TAKE ONE TABLET BY MOUTH TWICE DAILY 180 tablet 3   Multiple Vitamin (MULTI-VITAMIN) tablet Take 1 tablet by mouth daily.     sulfamethoxazole-trimethoprim (BACTRIM DS) 800-160 MG tablet TAKE ONE TABLET BY MOUTH TWICE DAILY ON MONDAY, WEDNESDAY AND  FRIDAY. 30 tablet 1   valACYclovir (VALTREX) 500 MG tablet Take 500 mg by mouth daily.     UNABLE TO FIND Med Name: CBD oil (Patient not taking: Reported on 10/12/2021)     No current facility-administered medications for this visit.     Allergies:   Allopurinol, Azithromycin, and Losartan potassium   Social History:  The patient  reports that he has  been smoking cigars and cigarettes. He has a 20.00 pack-year smoking history. He has never used smokeless tobacco. He reports current alcohol use. He reports that he does not use drugs.   Family History:   family history is not on file. He was adopted.    Review of Systems: Review of Systems  Constitutional: Negative.   HENT: Negative.    Respiratory: Negative.    Cardiovascular:  Positive for leg swelling.  Gastrointestinal: Negative.   Musculoskeletal: Negative.   Neurological: Negative.   Psychiatric/Behavioral: Negative.    All other systems reviewed and are negative.  PHYSICAL EXAM: VS:  BP 120/60 (BP Location: Left Arm, Patient Position: Sitting, Cuff Size: Normal)   Pulse 67   Ht 6' (1.829 m)   Wt 289 lb 8 oz (131.3 kg)   SpO2 98%   BMI 39.26 kg/m  , BMI Body mass index is 39.26 kg/m. Constitutional:  oriented to person, place, and time. No distress.  HENT:  Head: Grossly normal Eyes:  no discharge. No scleral icterus.  Neck: No JVD, no carotid bruits  Cardiovascular: Regular rate and rhythm, no murmurs appreciated Nonpitting leg edema Pulmonary/Chest: Clear to auscultation bilaterally, no wheezes or rails Abdominal: Soft.  no distension.  no tenderness.  Musculoskeletal: Normal range of motion Neurological:  normal muscle tone. Coordination normal. No atrophy Skin: Skin warm and dry Psychiatric: normal affect, pleasant  Recent Labs: 11/13/2020: Magnesium 1.9 05/13/2021: ALT 21 08/11/2021: BUN 20; Creatinine, Ser 1.02; Hemoglobin 13.8; Platelets 157; Potassium 4.2; Sodium 139    Lipid Panel No results found for: CHOL, HDL, LDLCALC, TRIG    Wt Readings from Last 3 Encounters:  10/12/21 289 lb 8 oz (131.3 kg)  08/11/21 287 lb 9.6 oz (130.5 kg)  05/29/21 285 lb (129.3 kg)     ASSESSMENT AND PLAN:  Problem List Items Addressed This Visit       Cardiology Problems   Bilateral pulmonary embolism (HCC)   Dilated cardiomyopathy ([place]) - Primary     Other    Lymphoma of lymph nodes ([place])   Morbid obesity ([employer])   Compulsive tobacco user syndrome   Other Visit Diagnoses     SOB (shortness of breath)       Suspected sleep apnea         Cardiomyopathy EF 50% in 2021 Previous intolerance of losartan Continue carvedilol Has asymptomatic, relatively stable, will hold off on adding additional medications at this time  Lymphoma Has follow-up with hematology oncology Treatment as above Followed by oncology  DVT/PE bilateral  talked with hematology oncology, off xarelto  Hypertension Continue Coreg 6.25 twice daily  Smoker Encourage smoking cessation, cigars  Aortic atherosclerosis Seen on CT scan in 2015, mild in nature descending distal aorta Mild dissuse disease on CT scan 2020 No lipids on record, lipdis ordered LDL less than 70 would be his goal  Lower extremity edema Previous DVT, likely component of lymphedema, recommended compression hose and legs up when sitting Oncology has held anticoagulation    Total encounter time more than 25 minutes  Greater than 50% was spent in  counseling and coordination of care with the patient   Signed, Esmond Plants, M.D., Ph.D. [place], [place]

## 2021-10-13 ENCOUNTER — Telehealth: Payer: Self-pay

## 2021-10-13 LAB — LIPID PANEL
Chol/HDL Ratio: 4.6 ratio (ref 0.0–5.0)
Cholesterol, Total: 173 mg/dL (ref 100–199)
HDL: 38 mg/dL — ABNORMAL LOW (ref >39)
LDL Chol Calc (NIH): 90 mg/dL (ref 0–99)
Triglycerides: 272 mg/dL — ABNORMAL HIGH (ref 0–149)
VLDL Cholesterol Cal: 45 mg/dL — ABNORMAL HIGH (ref 5–40)

## 2021-10-13 MED ORDER — ROSUVASTATIN CALCIUM 5 MG PO TABS: 5.0000 mg | ORAL_TABLET | Freq: Every day | ORAL | 3 refills | Status: DC

## 2021-10-13 NOTE — Telephone Encounter (Signed)
Able to reach pt regarding his recent lab work, Dr. Ellin Saba had a chance to review their results and advised   "Cholesterol above goal  Given atherosclerosis seen on CT scan, would consider starting Crestor 5 mg daily  Goal LDL less than 70, preferably less than 60 "  Eric [name] agrees to start Crestor 5 mg daily, med sent to [facility], otherwise. all questions  were address and no additional concerns at this time. Agreeable to plan, will call back for anything further.

## 2021-10-14 ENCOUNTER — Encounter: Payer: Self-pay | Admitting: Cardiovascular Disease

## 2021-10-28 DIAGNOSIS — K551 Chronic vascular disorders of intestine: Secondary | ICD-10-CM | POA: Diagnosis not present

## 2021-10-28 DIAGNOSIS — R161 Splenomegaly, not elsewhere classified: Secondary | ICD-10-CM | POA: Diagnosis not present

## 2021-10-28 DIAGNOSIS — Z9484 Stem cells transplant status: Secondary | ICD-10-CM | POA: Diagnosis not present

## 2021-10-28 DIAGNOSIS — Z86711 Personal history of pulmonary embolism: Secondary | ICD-10-CM | POA: Diagnosis not present

## 2021-10-28 DIAGNOSIS — Z792 Long term (current) use of antibiotics: Secondary | ICD-10-CM | POA: Diagnosis not present

## 2021-10-28 DIAGNOSIS — C8590 Non-Hodgkin lymphoma, unspecified, unspecified site: Secondary | ICD-10-CM | POA: Diagnosis not present

## 2021-10-28 DIAGNOSIS — N200 Calculus of kidney: Secondary | ICD-10-CM | POA: Diagnosis not present

## 2021-10-28 DIAGNOSIS — I2510 Atherosclerotic heart disease of native coronary artery without angina pectoris: Secondary | ICD-10-CM | POA: Diagnosis not present

## 2021-10-28 DIAGNOSIS — I77811 Abdominal aortic ectasia: Secondary | ICD-10-CM | POA: Diagnosis not present

## 2021-10-28 DIAGNOSIS — C8330 Diffuse large B-cell lymphoma, unspecified site: Secondary | ICD-10-CM | POA: Diagnosis not present

## 2021-10-28 DIAGNOSIS — C8331 Diffuse large B-cell lymphoma, lymph nodes of head, face, and neck: Secondary | ICD-10-CM | POA: Diagnosis not present

## 2021-10-28 DIAGNOSIS — Z79899 Other long term (current) drug therapy: Secondary | ICD-10-CM | POA: Diagnosis not present

## 2021-12-01 ENCOUNTER — Other Ambulatory Visit: Payer: Self-pay | Admitting: *Deleted

## 2021-12-01 DIAGNOSIS — C8330 Diffuse large B-cell lymphoma, unspecified site: Secondary | ICD-10-CM

## 2021-12-08 ENCOUNTER — Inpatient Hospital Stay: Payer: Medicare Other | Admitting: Internal Medicine

## 2021-12-08 ENCOUNTER — Telehealth: Payer: Self-pay | Admitting: Internal Medicine

## 2021-12-08 ENCOUNTER — Inpatient Hospital Stay: Payer: Medicare Other

## 2021-12-08 NOTE — Telephone Encounter (Signed)
Pt left VM to cancel his appt this morning. Pt will call back to reschedule at a later date.

## 2021-12-21 ENCOUNTER — Encounter: Payer: Self-pay | Admitting: Internal Medicine

## 2022-04-28 DIAGNOSIS — M7989 Other specified soft tissue disorders: Secondary | ICD-10-CM | POA: Diagnosis not present

## 2022-04-28 DIAGNOSIS — R935 Abnormal findings on diagnostic imaging of other abdominal regions, including retroperitoneum: Secondary | ICD-10-CM | POA: Diagnosis not present

## 2022-04-28 DIAGNOSIS — C8330 Diffuse large B-cell lymphoma, unspecified site: Secondary | ICD-10-CM | POA: Diagnosis not present

## 2022-04-28 DIAGNOSIS — N200 Calculus of kidney: Secondary | ICD-10-CM | POA: Diagnosis not present

## 2022-04-28 DIAGNOSIS — K551 Chronic vascular disorders of intestine: Secondary | ICD-10-CM | POA: Diagnosis not present

## 2022-04-28 DIAGNOSIS — C8590 Non-Hodgkin lymphoma, unspecified, unspecified site: Secondary | ICD-10-CM | POA: Diagnosis not present

## 2022-05-03 ENCOUNTER — Other Ambulatory Visit: Payer: Self-pay | Admitting: Internal Medicine

## 2022-05-11 ENCOUNTER — Encounter: Payer: Self-pay | Admitting: Internal Medicine

## 2022-05-27 ENCOUNTER — Other Ambulatory Visit: Payer: Self-pay

## 2022-06-16 ENCOUNTER — Ambulatory Visit: Payer: Self-pay | Admitting: *Deleted

## 2022-06-16 NOTE — Patient Outreach (Signed)
  Care Coordination   06/16/2022 Name: Eric [name] MRN: [id number] DOB: 1951-09-09   Care Coordination Outreach Attempts:  An unsuccessful telephone outreach was attempted today to offer the patient information about available care coordination services as a benefit of their health plan.   Follow Up Plan:  Additional outreach attempts will be made to offer the patient care coordination information and services.   Encounter Outcome:  No Answer  Care Coordination Interventions Activated:  No   Care Coordination Interventions:  No, not indicated    Giulliana Mcroberts, [place] Worker  [facility] Care Management [phone]

## 2022-09-13 DIAGNOSIS — H903 Sensorineural hearing loss, bilateral: Secondary | ICD-10-CM | POA: Diagnosis not present

## 2022-09-13 DIAGNOSIS — H6123 Impacted cerumen, bilateral: Secondary | ICD-10-CM | POA: Diagnosis not present

## 2022-09-13 DIAGNOSIS — Z8572 Personal history of non-Hodgkin lymphomas: Secondary | ICD-10-CM | POA: Diagnosis not present

## 2022-10-20 DIAGNOSIS — Z79899 Other long term (current) drug therapy: Secondary | ICD-10-CM | POA: Diagnosis not present

## 2022-10-20 DIAGNOSIS — F17210 Nicotine dependence, cigarettes, uncomplicated: Secondary | ICD-10-CM | POA: Diagnosis not present

## 2022-10-20 DIAGNOSIS — C8330 Diffuse large B-cell lymphoma, unspecified site: Secondary | ICD-10-CM | POA: Diagnosis not present

## 2022-11-12 ENCOUNTER — Telehealth: Payer: Self-pay | Admitting: *Deleted

## 2022-11-12 NOTE — Patient Outreach (Signed)
  Care Coordination   11/12/2022 Name: Eric [name] MRN: [id number] DOB: 1951/08/22   Care Coordination Outreach Attempts:  A second unsuccessful outreach was attempted today to offer the patient with information about available care coordination services as a benefit of their health plan.     Follow Up Plan:  Additional outreach attempts will be made to offer the patient care coordination information and services.   Encounter Outcome:  No Answer   Care Coordination Interventions:  Yes, provided    Valente David, RN, MSN, [place] Care Management Care Management Coordinator [phone]

## 2022-11-16 ENCOUNTER — Other Ambulatory Visit: Payer: Self-pay | Admitting: Diagnostic Radiology

## 2022-11-17 ENCOUNTER — Telehealth: Payer: Self-pay | Admitting: *Deleted

## 2022-11-17 NOTE — Patient Instructions (Signed)
Visit Information  Thank you for taking time to visit with me today. Please don't hesitate to contact me if I can be of assistance to you.  Following are the goals we discussed today:  Call PCP office to schedule annual wellness visit.   Please call the Suicide and Crisis Lifeline: 988 call the [place] National Suicide Prevention Lifeline: 661 781 9553 or TTY: 831 468 9632 TTY 660-450-8038) to talk to a trained counselor call 1-800-273-TALK (toll free, 24 hour hotline) call 911 if you are experiencing a Mental Health or [place] or need someone to talk to.  Patient verbalizes understanding of instructions and care plan provided today and agrees to view in [place]. Active MyChart status and patient understanding of how to access instructions and care plan via MyChart confirmed with patient.     The patient has been provided with contact information for the care management team and has been advised to call with any health related questions or concerns.   Valente David, RN, MSN, [place] Care Management Care Management Coordinator [phone]

## 2022-11-17 NOTE — Patient Outreach (Signed)
  Care Coordination   Initial Visit Note   11/17/2022 Name: Eric [name] MRN: [id number] DOB: Jan 13, 1951  Eric [name] is a 72 y.o. year old male who sees Fisher, Kirstie Peri, MD for primary care. I spoke with  Eric [name] by phone today.  What matters to the patients health and wellness today?  Ongoing management and remission of stage 3 lymphoma.  State he is doing very well, has not had any complications.  Will call this RNCM if he feels follow up is needed or if he has any questions.     Goals Addressed             This Visit's Progress    COMPLETED: Care Coordiantion Activities - No follow up needed       Care Coordination Interventions: Assessed patient understanding of cancer diagnosis and recommended treatment plan Reviewed upcoming provider appointments and treatment appointments Assessed available transportation to appointments and treatments. Has consistent/reliable transportation: Yes Assessed support system. Has consistent/reliable family or other support: Yes SDOH assessment completed Discussed importance of maintaining involvement with PCP for general care.  State he has not seen PCP in a while due to frequent visits with oncology. He will call to schedule AWV.          SDOH assessments and interventions completed:  Yes  SDOH Interventions Today    Flowsheet Row Most Recent Value  SDOH Interventions   Food Insecurity Interventions Intervention Not Indicated  Housing Interventions Intervention Not Indicated  Transportation Interventions Intervention Not Indicated        Care Coordination Interventions:  Yes, provided   Follow up plan: No further intervention required.   Encounter Outcome:  Pt. Visit Completed   Valente David, RN, MSN, [facility] Care Management Care Management Coordinator [phone]

## 2022-12-06 ENCOUNTER — Ambulatory Visit: Payer: Medicare Other | Admitting: Cardiovascular Disease

## 2023-01-04 ENCOUNTER — Encounter: Payer: Self-pay | Admitting: Family Medicine

## 2023-03-14 DIAGNOSIS — H903 Sensorineural hearing loss, bilateral: Secondary | ICD-10-CM | POA: Diagnosis not present

## 2023-03-14 DIAGNOSIS — Z8572 Personal history of non-Hodgkin lymphomas: Secondary | ICD-10-CM | POA: Diagnosis not present

## 2023-03-14 DIAGNOSIS — H6123 Impacted cerumen, bilateral: Secondary | ICD-10-CM | POA: Diagnosis not present

## 2023-03-14 DIAGNOSIS — J0100 Acute maxillary sinusitis, unspecified: Secondary | ICD-10-CM | POA: Diagnosis not present

## 2023-03-14 DIAGNOSIS — J301 Allergic rhinitis due to pollen: Secondary | ICD-10-CM | POA: Diagnosis not present

## 2023-03-25 DIAGNOSIS — H903 Sensorineural hearing loss, bilateral: Secondary | ICD-10-CM | POA: Diagnosis not present

## 2023-05-09 ENCOUNTER — Telehealth: Payer: Self-pay | Admitting: Internal Medicine

## 2023-05-09 NOTE — Telephone Encounter (Signed)
Pt was last evaluated in 2022. Pt transferred his care to [facility] and they have completed with him and would like Dr. B to resume care again. I informed patient that Dr. Leonard Schwartz was out of the office this week and he may want to review his records from [facility] to determine when the patient may need follow-up. Appt was not scheduled at this time and pt was informed someone would be in contact with him next week.

## 2023-06-14 ENCOUNTER — Other Ambulatory Visit: Payer: Self-pay | Admitting: *Deleted

## 2023-06-14 DIAGNOSIS — C8330 Diffuse large B-cell lymphoma, unspecified site: Secondary | ICD-10-CM

## 2023-06-15 ENCOUNTER — Encounter: Payer: Self-pay | Admitting: Internal Medicine

## 2023-06-15 ENCOUNTER — Inpatient Hospital Stay: Payer: Medicare Other | Attending: Internal Medicine | Admitting: Internal Medicine

## 2023-06-15 ENCOUNTER — Inpatient Hospital Stay: Payer: Medicare Other

## 2023-06-15 VITALS — BP 137/69 | HR 77 | Temp 98.5°F | Ht 72.0 in | Wt 309.4 lb

## 2023-06-15 DIAGNOSIS — C8330 Diffuse large B-cell lymphoma, unspecified site: Secondary | ICD-10-CM

## 2023-06-15 DIAGNOSIS — C8590 Non-Hodgkin lymphoma, unspecified, unspecified site: Secondary | ICD-10-CM | POA: Diagnosis not present

## 2023-06-15 DIAGNOSIS — D649 Anemia, unspecified: Secondary | ICD-10-CM | POA: Insufficient documentation

## 2023-06-15 DIAGNOSIS — Z8572 Personal history of non-Hodgkin lymphomas: Secondary | ICD-10-CM | POA: Diagnosis not present

## 2023-06-15 DIAGNOSIS — M7989 Other specified soft tissue disorders: Secondary | ICD-10-CM | POA: Diagnosis not present

## 2023-06-15 LAB — CBC WITH DIFFERENTIAL (CANCER CENTER ONLY)
Abs Immature Granulocytes: 0.05 10*3/uL (ref 0.00–0.07)
Basophils Absolute: 0 10*3/uL (ref 0.0–0.1)
Basophils Relative: 1 %
Eosinophils Absolute: 0.4 10*3/uL (ref 0.0–0.5)
Eosinophils Relative: 9 %
HCT: 38.4 % — ABNORMAL LOW (ref 39.0–52.0)
Hemoglobin: 12.9 g/dL — ABNORMAL LOW (ref 13.0–17.0)
Immature Granulocytes: 1 %
Lymphocytes Relative: 12 %
Lymphs Abs: 0.6 10*3/uL — ABNORMAL LOW (ref 0.7–4.0)
MCH: 32 pg (ref 26.0–34.0)
MCHC: 33.6 g/dL (ref 30.0–36.0)
MCV: 95.3 fL (ref 80.0–100.0)
Monocytes Absolute: 0.5 10*3/uL (ref 0.1–1.0)
Monocytes Relative: 12 %
Neutro Abs: 2.9 10*3/uL (ref 1.7–7.7)
Neutrophils Relative %: 65 %
Platelet Count: 116 10*3/uL — ABNORMAL LOW (ref 150–400)
RBC: 4.03 MIL/uL — ABNORMAL LOW (ref 4.22–5.81)
RDW: 17.2 % — ABNORMAL HIGH (ref 11.5–15.5)
WBC Count: 4.5 10*3/uL (ref 4.0–10.5)
nRBC: 0 % (ref 0.0–0.2)

## 2023-06-15 LAB — CMP (CANCER CENTER ONLY)
ALT: 25 U/L (ref 0–44)
AST: 23 U/L (ref 15–41)
Albumin: 4.1 g/dL (ref 3.5–5.0)
Alkaline Phosphatase: 87 U/L (ref 38–126)
Anion gap: 8 (ref 5–15)
BUN: 21 mg/dL (ref 8–23)
CO2: 27 mmol/L (ref 22–32)
Calcium: 8.9 mg/dL (ref 8.9–10.3)
Chloride: 105 mmol/L (ref 98–111)
Creatinine: 0.98 mg/dL (ref 0.61–1.24)
GFR, Estimated: >60 mL/min (ref >60)
Glucose, Bld: 106 mg/dL — ABNORMAL HIGH (ref 70–99)
Potassium: 4.1 mmol/L (ref 3.5–5.1)
Sodium: 140 mmol/L (ref 135–145)
Total Bilirubin: 1.2 mg/dL (ref 0.3–1.2)
Total Protein: 6.1 g/dL — ABNORMAL LOW (ref 6.5–8.1)

## 2023-06-15 LAB — LACTATE DEHYDROGENASE: LDH: 246 U/L — ABNORMAL HIGH (ref 98–192)

## 2023-06-15 NOTE — Progress Notes (Signed)
No questions or concerns today 

## 2023-06-15 NOTE — Progress Notes (Signed)
[facility] CONSULT NOTE  Patient Care Team: Malva Limes, MD as PCP - General (Family Medicine) Lemar Livings, Merrily Pew, MD (General Surgery) Earna Coder, MD as Consulting Physician (Internal Medicine)  CHIEF COMPLAINTS/PURPOSE OF CONSULTATION: Diffuse large B-cell lymphoma  #  Oncology History Overview Note  #June 29, 2019-CT- bulky bilateral neck adenopathy left more than right; September 2020-Diffuse B-cell lymphoma; ABC subtype; NEGATIVE FISH studies.  PET scan -bulky adenopathy involving neck /lymphadenopathy mediastinum /abdomen/pelvis inguinal lymph nodes; splenic involvement.  Question bone.  Stage IV [spleen involvement; no bone marrow biopsy].  BCL-2: Positive /BCL 6: Positive /MUM-1: Positive /CD10: Negative 'Ki-67: High proliferation index (90%) /CD30: Negative /Cyclin D1: Negative C-MYC: Equivocal (20% staining seen on limited and fragmented tissue) /EBER: Negative   # Ritux-Pred- 9/11; 9/18- R-COP [held adria- sec to EF 45-50%]  #May 2021-PET scan left neck recurrent disease; excisional biopsy [Dr. Vaught]-recurrent diffuse left B-cell lymphoma  #November 2020-bilateral PE; on xarelto  #J07/07/2020- 2021- Gem-CarboDex [II opinion [facility]; Dr. Noralee Stain; stem cell transplant eval]; no Rituxan-repeat CD20 negative.   #S/p 2 cycles-gem carbo Dex-June 18, 2020 PET scan-slight increase in SUV uptake of the left cervical lymph nodes; no distant metastatic disease.  Discontinued chemotherapy; refer to CAR-T;  OCT 12th 2021- [facility]- -New extensive cervical, supraclavicular, intrathoracic, axillary and abdominal and pelvic lymph nodes, worrisome for lymphoma involvement. New splenomegaly with new, diffuse uptake, now more intense than the liver, concerning for lymphoma involvement. S/p POLA-BENDA  # CAR-T/Axi-cel on 09/16/20 preceded by fludarabine/cyclophosphamide lymphodepletion.[[facility]; Dr.Grover]; complicated by CRS-G-3 [ICU]; dec 27th-PET CR.   # Sep 2020-  Borderline low EF/Left ventricular hypokinesis [Dr.Gollan]-s October 9th stress test-nonischemic;   # DIAGNOSIS: DLBCL  STAGE:   4      ;GOALS: Cure  CURRENT/MOST RECENT THERAPY : R-COP/-R-CEOP. [C]    Lymphoma of lymph nodes (HCC)  07/03/2019 Initial Diagnosis   Lymphoma of lymph nodes (HCC)   07/13/2019 - 12/07/2019 Chemotherapy   The patient had palonosetron (ALOXI) injection 0.25 mg, 0.25 mg, Intravenous,  Once, 7 of 7 cycles Administration: 0.25 mg (07/20/2019), 0.25 mg (08/13/2019), 0.25 mg (11/13/2019), 0.25 mg (09/03/2019), 0.25 mg (10/02/2019), 0.25 mg (10/23/2019), 0.25 mg (12/04/2019) pegfilgrastim (NEULASTA ONPRO KIT) injection 6 mg, 6 mg, Subcutaneous, Once, 2 of 2 cycles Administration: 6 mg (10/04/2019), 6 mg (10/25/2019) pegfilgrastim-cbqv (UDENYCA) injection 6 mg, 6 mg, Subcutaneous, Once, 5 of 5 cycles Administration: 6 mg (07/23/2019), 6 mg (08/16/2019), 6 mg (09/06/2019), 6 mg (11/16/2019), 6 mg (12/07/2019) vinCRIStine (ONCOVIN) 2 mg in sodium chloride 0.9 % 50 mL chemo infusion, 2 mg, Intravenous,  Once, 7 of 7 cycles Administration: 2 mg (07/20/2019), 2 mg (08/13/2019), 2 mg (11/13/2019), 2 mg (09/03/2019), 2 mg (10/02/2019), 2 mg (10/23/2019), 2 mg (12/04/2019) cyclophosphamide (CYTOXAN) 1,900 mg in sodium chloride 0.9 % 250 mL chemo infusion, 750 mg/m2 = 1,900 mg, Intravenous,  Once, 7 of 7 cycles Administration: 1,900 mg (07/20/2019), 1,900 mg (08/13/2019), 2,000 mg (11/13/2019), 1,900 mg (09/03/2019), 2,000 mg (10/02/2019), 2,000 mg (10/23/2019), 2,000 mg (12/04/2019) etoposide (VEPESID) 250 mg in sodium chloride 0.9 % 1,000 mL chemo infusion, 100 mg/m2 = 250 mg, Intravenous,  Once, 6 of 6 cycles Dose modification: 50 mg/m2 (original dose 100 mg/m2, Cycle 3, Reason: Provider Judgment) Administration: 130 mg (08/14/2019), 130 mg (08/15/2019), 130 mg (09/03/2019), 130 mg (09/04/2019), 130 mg (09/05/2019), 130 mg (10/03/2019), 130 mg (10/04/2019), 130 mg (10/24/2019), 130 mg (10/25/2019), 130 mg  (11/13/2019), 130 mg (11/14/2019), 130 mg (11/15/2019), 130 mg (10/02/2019), 130 mg (10/23/2019),  130 mg (12/04/2019), 130 mg (12/05/2019), 130 mg (12/06/2019) dexamethasone (DECADRON) 12 mg in sodium chloride 0.9 % 145 mL IVPB, , Intravenous,  Once, 8 of 8 cycles Administration:  (07/13/2019),  (07/20/2019),  (08/13/2019),  (11/13/2019),  (09/03/2019),  (10/02/2019),  (10/23/2019),  (12/04/2019) riTUXimab-pvvr (RUXIENCE) 900 mg in sodium chloride 0.9 % 250 mL (2.6471 mg/mL) infusion, 375 mg/m2 = 900 mg (100 % of original dose 375 mg/m2), Intravenous,  Once, 1 of 1 cycle Dose modification: 375 mg/m2 (original dose 375 mg/m2, Cycle 1) Administration: 900 mg (07/13/2019)  for chemotherapy treatment.    05/09/2020 - 06/06/2020 Chemotherapy   The patient had palonosetron (ALOXI) injection 0.25 mg, 0.25 mg, Intravenous,  Once, 2 of 4 cycles Administration: 0.25 mg (05/09/2020), 0.25 mg (05/30/2020) CARBOplatin (PARAPLATIN) 750 mg in sodium chloride 0.9 % 250 mL chemo infusion, 750 mg (100 % of original dose 750 mg), Intravenous,  Once, 2 of 4 cycles Dose modification:   (original dose 750 mg, Cycle 1, Reason: Provider Judgment, Comment: labs from care every where. ) Administration: 750 mg (05/09/2020), 750 mg (05/30/2020) gemcitabine (GEMZAR) 2,500 mg in sodium chloride 0.9 % 250 mL chemo infusion, 2,584 mg, Intravenous,  Once, 2 of 4 cycles Administration: 2,500 mg (05/09/2020), 2,500 mg (05/16/2020), 2,500 mg (05/30/2020), 2,500 mg (06/06/2020) fosaprepitant (EMEND) 150 mg in sodium chloride 0.9 % 145 mL IVPB, 150 mg, Intravenous,  Once, 2 of 4 cycles Administration: 150 mg (05/09/2020), 150 mg (05/30/2020)  for chemotherapy treatment.    08/27/2020 - 08/27/2020 Chemotherapy   Patient is on Treatment Plan : NON-HODGKINS LYMPHOMA RELAPSED/ REFRACTORY DLBCL Polatuzumab + Bendamustine + Rituximab q21d x 6 cycles     11/13/2020 Cancer Staging   Staging form: Hodgkin and Non-Hodgkin Lymphoma, AJCC 8th Edition - Clinical: Stage IV  (Diffuse large B-cell lymphoma) - Signed by Earna Coder, MD on 11/13/2020     HISTORY OF PRESENTING ILLNESS: Ambulating independently.  Alone.  Eric [name] 72 y.o.  male relapsed/refractory diffuse large B-cell lymphoma currently s/p CART therapy at [facility] in November 2021 is here for follow-up.   In the interim patient has been following up with [facility].  Last CT scan in December 2023 noted some recurrence.  Patient continues to deny any new lumps or bumps.  Appetite is good.  No weight loss.  Has gained weight.  Mild legs.  Appetite is good.  No weight loss.  No fever no chills.  Review of Systems  Constitutional:  Negative for chills, diaphoresis and fever.  HENT:  Positive for hearing loss. Negative for nosebleeds and sore throat.   Eyes:  Negative for double vision.  Respiratory:  Negative for hemoptysis, sputum production and wheezing.   Cardiovascular:  Negative for chest pain, palpitations, orthopnea and leg swelling.  Gastrointestinal:  Negative for abdominal pain, blood in stool, constipation, diarrhea, heartburn, melena, nausea and vomiting.  Genitourinary:  Negative for dysuria, frequency and urgency.  Musculoskeletal:  Negative for back pain and joint pain.  Skin: Negative.  Negative for itching and rash.  Neurological:  Negative for dizziness, tingling, focal weakness, weakness and headaches.  Endo/Heme/Allergies:  Does not bruise/bleed easily.  Psychiatric/Behavioral:  Negative for depression. The patient does not have insomnia.      MEDICAL HISTORY:  Past Medical History:  Diagnosis Date   Adopted person    Colon polyps    Pancreatitis    Rectal bleeding 02/24/14   Sepsis (HCC) 09/18/2019    SURGICAL HISTORY: Past Surgical History:  Procedure Laterality Date  CHOLECYSTECTOMY     COLONOSCOPY  02/28/14   IR IMAGING GUIDED PORT INSERTION  07/18/2019   IR REMOVAL TUN ACCESS W/ PORT W/O FL MOD SED  05/29/2021   LYMPH NODE BIOPSY Left 03/28/2020   Procedure:  EXCISION OF SUBMANDIBULAR  LYMPH NODE;  Surgeon: Bud Face, MD;  Location: [facility];  Service: ENT;  Laterality: Left;   POLYPECTOMY     colon polyp removed   TONSILLECTOMY      SOCIAL HISTORY: Social History   Socioeconomic History   Marital status: Married    Spouse name: Not on file   Number of children: Not on file   Years of education: Not on file   Highest education level: Not on file  Occupational History    Comment: Retired from [employer]  Tobacco Use   Smoking status: Every Day    Current packs/day: 1.00    Average packs/day: 1 pack/day for 20.0 years (20.0 ttl pk-yrs)    Types: Cigars, Cigarettes   Smokeless tobacco: Never   Tobacco comments:    smoke 1/2 ppd cigaretes since 72 yo. changed to cigars at age July 2004.   Vaping Use   Vaping status: Never Used  Substance and Sexual Activity   Alcohol use: Yes    Comment: 6-7 drinks weekly   Drug use: No   Sexual activity: Not on file  Other Topics Concern   Not on file  Social History Narrative   Lives at home with wife; in [place]. retd from [employer]. Cigar smoker; drinks liquor. Children - grown up.    Social Determinants of Health   Financial Resource Strain: Low Risk  (09/17/2020)   [facility] from [facility]   Overall Financial Resource Strain (CARDIA)    Difficulty of Paying Living Expenses: Not hard at all  Food Insecurity: No Food Insecurity (11/17/2022)   Hunger Vital Sign    Worried About Running Out of Food in the Last Year: Never true    Ran Out of Food in the Last Year: Never true  Transportation Needs: No Transportation Needs (11/17/2022)   PRAPARE - Administrator, Civil Service (Medical): No    Lack of Transportation (Non-Medical): No  Physical Activity: Not on file  Stress: Not on file  Social Connections: Not on file  Intimate Partner Violence: Not on file    FAMILY HISTORY: Family History  Adopted: Yes    ALLERGIES:  is allergic to  allopurinol, azithromycin, and losartan potassium.  MEDICATIONS:  Current Outpatient Medications  Medication Sig Dispense Refill   Multiple Vitamin (MULTI-VITAMIN) tablet Take 1 tablet by mouth daily.     carvedilol (COREG) 6.25 MG tablet Take 1 tablet (6.25 mg total) by mouth 2 (two) times daily. (Patient not taking: Reported on 06/15/2023) 180 tablet 3   rosuvastatin (CRESTOR) 5 MG tablet Take 1 tablet (5 mg total) by mouth daily. 90 tablet 3   sulfamethoxazole-trimethoprim (BACTRIM DS) 800-160 MG tablet TAKE ONE TABLET BY MOUTH TWICE DAILY ON MONDAY, WEDNESDAY AND FRIDAY. (Patient not taking: Reported on 06/15/2023) 30 tablet 1   UNABLE TO FIND Med Name: CBD oil (Patient not taking: Reported on 10/12/2021)     valACYclovir (VALTREX) 500 MG tablet Take 500 mg by mouth daily. (Patient not taking: Reported on 06/15/2023)     No current facility-administered medications for this visit.      Marland Kitchen  PHYSICAL EXAMINATION: ECOG PERFORMANCE STATUS: 1 - Symptomatic but completely ambulatory  Vitals:  06/15/23 1024  BP: 137/69  Pulse: 77  Temp: 98.5 F (36.9 C)  SpO2: 98%   Filed Weights   06/15/23 1024  Weight: (!) 309 lb 6.4 oz (140.3 kg)    Physical Exam HENT:     Head: Normocephalic and atraumatic.     Mouth/Throat:     Pharynx: No oropharyngeal exudate.  Eyes:     Pupils: Pupils are equal, round, and reactive to light.  Cardiovascular:     Rate and Rhythm: Normal rate and regular rhythm.  Pulmonary:     Effort: Pulmonary effort is normal. No respiratory distress.     Breath sounds: Normal breath sounds. No wheezing.  Abdominal:     General: Bowel sounds are normal. There is no distension.     Palpations: Abdomen is soft. There is no mass.     Tenderness: There is no abdominal tenderness. There is no guarding or rebound.  Musculoskeletal:        General: No tenderness. Normal range of motion.     Cervical back: Normal range of motion and neck supple.  Skin:    General:  Skin is warm.  Neurological:     Mental Status: He is alert and oriented to person, place, and time.  Psychiatric:        Mood and Affect: Affect normal.    LABORATORY DATA:  I have reviewed the data as listed Lab Results  Component Value Date   WBC 4.5 06/15/2023   HGB 12.9 (L) 06/15/2023   HCT 38.4 (L) 06/15/2023   MCV 95.3 06/15/2023   PLT 116 (L) 06/15/2023   Recent Labs    06/15/23 0955  NA 140  K 4.1  CL 105  CO2 27  GLUCOSE 106*  BUN 21  CREATININE 0.98  CALCIUM 8.9  GFRNONAA >60  PROT 6.1*  ALBUMIN 4.1  AST 23  ALT 25  ALKPHOS 87  BILITOT 1.2    RADIOGRAPHIC STUDIES: I have personally reviewed the radiological images as listed and agreed with the findings in the report. No results found.  ASSESSMENT & PLAN:   Lymphoma of lymph nodes (HCC) # Chemo refractory/ Relapsed diffuse large B-cell lymphoma-ABC subtype; IV [spleen]; NOV 2021- CART therapy.  DEC 2023- CT N/CAP- NEG.  Stable.  Will repeat CT CAP-NECK in 4 months.   # Mild anemia/mild thrombocytopenia-lymphopenia likely secondary to CAR-T-improving.  Absolute lymphocyte count 600- hemoglobin/platelets- stable.   # Borderline LOW EF of 45-50%; Sep Echo 50-55% [Dr.Gollan]- on coreg BID -stable  # Infectious Disease/Prophylaxis: Currently OFF valtrex/Bactrim DS. Check CD 4 counts.   # Obesity: again re-iterated the importance of eight loss; nutritional recommendations/low-carb diet.   # Bilateral LE swelling- recommend compression stockings.   # DISPOSITION:  # follow up 4 months- MD; labs- cbc/cmp/ldh;prior-CT CAPN-Dr.B    All questions were answered. The patient knows to call the clinic with any problems, questions or concerns.    Earna Coder, MD 06/15/2023 11:34 AM

## 2023-06-15 NOTE — Assessment & Plan Note (Addendum)
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  DEC 2023- CT N/CAP- NEG.  Stable.  Will repeat CT CAP-NECK in 4 months.   # Mild anemia/mild thrombocytopenia-lymphopenia likely secondary to CAR-T-improving.  Absolute lymphocyte count 600- hemoglobin/platelets- stable.   # Borderline LOW EF of 45-50%; Sep Echo 50-55% [Dr.Gollan]- on coreg BID -stable  # Infectious Disease/Prophylaxis: Currently OFF valtrex/Bactrim DS. Check CD 4 counts.   # Obesity: again re-iterated the importance of eight loss; nutritional recommendations/low-carb diet.   # Bilateral LE swelling- recommend compression stockings.   # DISPOSITION:  # follow up 4 months- MD; labs- cbc/cmp/ldh;prior-CT CAPN-Dr.B

## 2023-06-27 ENCOUNTER — Encounter: Payer: Self-pay | Admitting: Internal Medicine

## 2023-08-22 ENCOUNTER — Ambulatory Visit: Payer: Self-pay

## 2023-08-22 ENCOUNTER — Ambulatory Visit: Payer: Medicare Other | Admitting: Family Medicine

## 2023-08-22 VITALS — BP 124/70 | HR 91 | Temp 98.2°F | Resp 16 | Ht 72.0 in | Wt 297.0 lb

## 2023-08-22 DIAGNOSIS — R051 Acute cough: Secondary | ICD-10-CM

## 2023-08-22 MED ORDER — PREDNISONE 20 MG PO TABS: 40.0000 mg | ORAL_TABLET | Freq: Every day | ORAL | 0 refills | Status: AC

## 2023-08-22 MED ORDER — BENZONATATE 100 MG PO CAPS: 100.0000 mg | ORAL_CAPSULE | Freq: Three times a day (TID) | ORAL | 0 refills | Status: DC | PRN

## 2023-08-22 MED ORDER — ALBUTEROL SULFATE (2.5 MG/3ML) 0.083% IN NEBU
2.5000 mg | INHALATION_SOLUTION | Freq: Once | RESPIRATORY_TRACT | Status: AC
  Administered 2023-08-22: 2.5 mg via RESPIRATORY_TRACT

## 2023-08-22 MED ORDER — ALBUTEROL SULFATE HFA 108 (90 BASE) MCG/ACT IN AERS
2.0000 | INHALATION_SPRAY | Freq: Four times a day (QID) | RESPIRATORY_TRACT | 0 refills | Status: DC | PRN
Start: 2023-08-22 — End: 2023-10-24

## 2023-08-22 NOTE — Patient Instructions (Signed)
Recommend you start the steroids today or tomorrow morning (take with food in stomach and full glass of water) Use the inhaler every 4-6 hours or you may try 2 puffs with coughing fits.   Restart mucinex and you can try tessalon perles for cough  I ordered a Chest X-ray at [facility] if you do not have improvement or if there is any worsening, please notify us of your symptoms, schedule a recheck appointment and get the Xray done.   We hope to improve the coughing fits and shortness of breath in the next 3-5 days but I do expect cough to linger longer than that.     Acute Bronchitis, Adult  Acute bronchitis is when air tubes in the lungs (bronchi) suddenly get swollen. The condition can make it hard for you to breathe. In adults, acute bronchitis usually goes away within 2 weeks. A cough caused by bronchitis may last up to 3 weeks. Smoking, allergies, and asthma can make the condition worse. What are the causes? Germs that cause cold and flu (viruses). The most common cause of this condition is the virus that causes the common cold. Bacteria. Substances that bother (irritate) the lungs, including: Smoke from cigarettes and other types of tobacco. Dust and pollen. Fumes from chemicals, gases, or burned fuel. Indoor or outdoor air pollution. What increases the risk? A weak body's defense system. This is also called the immune system. Any condition that affects your lungs and breathing, such as asthma. What are the signs or symptoms? A cough. Coughing up clear, yellow, or green mucus. Making high-pitched whistling sounds when you breathe, most often when you breathe out (wheezing). Runny or stuffy nose. Having too much mucus in your lungs (chest congestion). Shortness of breath. Body aches. A sore throat. How is this treated? Acute bronchitis may go away over time without treatment. Your doctor may tell you to: Drink more fluids. This will help thin your mucus so it is easier to cough  up. Use a device that gets medicine into your lungs (inhaler). Use a vaporizer or a humidifier. These are machines that add water to the air. This helps with coughing and poor breathing. Take a medicine that thins mucus and helps clear it from your lungs. Take a medicine that prevents or stops coughing. It is not common to take an antibiotic medicine for this condition. Follow these instructions at home:  Take over-the-counter and prescription medicines only as told by your doctor. Use an inhaler, vaporizer, or humidifier as told by your doctor. Take two teaspoons (10 mL) of honey at bedtime. This helps lessen your coughing at night. Drink enough fluid to keep your pee (urine) pale yellow. Do not smoke or use any products that contain nicotine or tobacco. If you need help quitting, ask your doctor. Get a lot of rest. Return to your normal activities when your doctor says that it is safe. Keep all follow-up visits. How is this prevented?  Wash your hands often with soap and water for at least 20 seconds. If you cannot use soap and water, use hand sanitizer. Avoid contact with people who have cold symptoms. Try not to touch your mouth, nose, or eyes with your hands. Avoid breathing in smoke or chemical fumes. Make sure to get the flu shot every year. Contact a doctor if: Your symptoms do not get better in 2 weeks. You have trouble coughing up the mucus. Your cough keeps you awake at night. You have a fever. Get help right away  if: You cough up blood. You have chest pain. You have very bad shortness of breath. You faint or keep feeling like you are going to faint. You have a very bad headache. Your fever or chills get worse. These symptoms may be an emergency. Get help right away. Call your local emergency services (911 in the [place] Do not wait to see if the symptoms will go away. Do not drive yourself to the hospital. Summary Acute bronchitis is when air tubes in the lungs  (bronchi) suddenly get swollen. In adults, acute bronchitis usually goes away within 2 weeks. Drink more fluids. This will help thin your mucus so it is easier to cough up. Take over-the-counter and prescription medicines only as told by your doctor. Contact a doctor if your symptoms do not improve after 2 weeks of treatment. This information is not intended to replace advice given to you by your health care provider. Make sure you discuss any questions you have with your health care provider. Document Revised: 02/18/2021 Document Reviewed: 02/18/2021 Elsevier Patient Education  2024 [employer].

## 2023-08-22 NOTE — Progress Notes (Unsigned)
Patient ID: Eric [name], male    DOB: [birth year], 72 y.o.   MRN: [id number]  PCP: Malva Limes, MD  Chief Complaint  Patient presents with   Cough    Non-productive, x1 week    Subjective:   Eric [name] is a 72 y.o. male, presents to clinic with CC of the following:  HPI  Pt presents for dry cough, runny nose and mild SOB onset 1 week ago Hx of lymphoma, tobacco abuse w/o reported hx of COPD, coughing fits   Patient Active Problem List   Diagnosis Date Noted   DLBCL (diffuse large B cell lymphoma) (HCC) 04/23/2020   Tobacco abuse 09/18/2019   Bilateral pulmonary embolism (HCC) 09/18/2019   Dilated cardiomyopathy (HCC) 07/29/2019   Lymphoma of lymph nodes (HCC) 07/03/2019   Diverticulosis 03/25/2016   History of colon polyps 03/25/2016   Morbid obesity (HCC) 03/25/2016   Compulsive tobacco user syndrome 03/25/2016   Dysplastic polyp of rectum 04/02/2014      Current Outpatient Medications:    Multiple Vitamin (MULTI-VITAMIN) tablet, Take 1 tablet by mouth daily., Disp: , Rfl:    Allergies  Allergen Reactions   Allopurinol Rash   Azithromycin Itching   Losartan Potassium Hives     Social History   Tobacco Use   Smoking status: Every Day    Current packs/day: 1.00    Average packs/day: 1 pack/day for 20.0 years (20.0 ttl pk-yrs)    Types: Cigars, Cigarettes   Smokeless tobacco: Never   Tobacco comments:    smoke 1/2 ppd cigaretes since 72 yo. changed to cigars at age July 2004.   Vaping Use   Vaping status: Never Used  Substance Use Topics   Alcohol use: Yes    Comment: 6-7 drinks weekly   Drug use: No      Chart Review Today: I personally reviewed active problem list, medication list, allergies, family history, social history, health maintenance, notes from last encounter, lab results, imaging with the patient/caregiver today.   Review of Systems  Constitutional: Negative.   HENT: Negative.    Eyes: Negative.   Respiratory: Negative.     Cardiovascular: Negative.   Gastrointestinal: Negative.   Endocrine: Negative.   Genitourinary: Negative.   Musculoskeletal: Negative.   Skin: Negative.   Allergic/Immunologic: Negative.   Neurological: Negative.   Hematological: Negative.   Psychiatric/Behavioral: Negative.    All other systems reviewed and are negative.      Objective:   Vitals:   08/22/23 1119  BP: 124/70  Pulse: 91  Resp: 16  Temp: 98.2 F (36.8 C)  SpO2: 97%  Weight: 297 lb (134.7 kg)  Height: 6' (1.829 m)    Body mass index is 40.28 kg/m.  Physical Exam Vitals and nursing note reviewed.  Constitutional:      General: He is not in acute distress.    Appearance: Normal appearance. He is well-developed. He is obese. He is not ill-appearing, toxic-appearing or diaphoretic.  HENT:     Head: Normocephalic and atraumatic.     Jaw: No trismus.     Right Ear: Tympanic membrane, ear canal and external ear normal.     Left Ear: Tympanic membrane, ear canal and external ear normal.     Nose: Mucosal edema, congestion and rhinorrhea present.     Right Sinus: No maxillary sinus tenderness or frontal sinus tenderness.     Left Sinus: No maxillary sinus tenderness or frontal sinus tenderness.     Mouth/Throat:  Mouth: Mucous membranes are normal. Mucous membranes are not pale, not dry and not cyanotic.     Pharynx: Uvula midline. Posterior oropharyngeal erythema present. No oropharyngeal exudate, posterior oropharyngeal edema or uvula swelling.     Tonsils: No tonsillar exudate or tonsillar abscesses.  Eyes:     General: Lids are normal. No scleral icterus.       Right eye: No discharge.        Left eye: No discharge.     Extraocular Movements: EOM normal.     Conjunctiva/sclera: Conjunctivae normal.  Neck:     Trachea: Trachea and phonation normal. No tracheal deviation.  Cardiovascular:     Rate and Rhythm: Normal rate and regular rhythm.     Pulses:          Radial pulses are 2+ on the right  side and 2+ on the left side.     Heart sounds: Normal heart sounds. No murmur heard.    No friction rub. No gallop.  Pulmonary:     Effort: Pulmonary effort is normal. No tachypnea, accessory muscle usage, respiratory distress or retractions.     Breath sounds: No stridor. Rhonchi present. No decreased breath sounds, wheezing or rales.     Comments: Frequent coughing fits Abdominal:     General: Bowel sounds are normal.  Musculoskeletal:        General: No edema.  Skin:    General: Skin is warm, dry and intact.     Coloration: Skin is not pale.     Findings: No lesion or rash.     Nails: There is no clubbing.  Neurological:     Mental Status: He is alert.     Motor: No abnormal muscle tone.     Coordination: Coordination normal.     Gait: Gait normal.  Psychiatric:        Mood and Affect: Mood and affect normal.        Speech: Speech normal.        Behavior: Behavior normal. Behavior is cooperative.      Results for orders placed or performed in visit on 06/15/23  Lactate dehydrogenase  Result Value Ref Range   LDH 246 (H) 98 - 192 U/L  CMP (Cancer Center only)  Result Value Ref Range   Sodium 140 135 - 145 mmol/L   Potassium 4.1 3.5 - 5.1 mmol/L   Chloride 105 98 - 111 mmol/L   CO2 27 22 - 32 mmol/L   Glucose, Bld 106 (H) 70 - 99 mg/dL   BUN 21 8 - 23 mg/dL   Creatinine 1.61 0.96 - 1.24 mg/dL   Calcium 8.9 8.9 - 04.5 mg/dL   Total Protein 6.1 (L) 6.5 - 8.1 g/dL   Albumin 4.1 3.5 - 5.0 g/dL   AST 23 15 - 41 U/L   ALT 25 0 - 44 U/L   Alkaline Phosphatase 87 38 - 126 U/L   Total Bilirubin 1.2 0.3 - 1.2 mg/dL   GFR, Estimated >40 >98 mL/min   Anion gap 8 5 - 15  CBC with Differential (Can[facility] Center Only)  Result Value Ref Range   WBC Count 4.5 4.0 - 10.5 K/uL   RBC 4.03 (L) 4.22 - 5.81 MIL/uL   Hemoglobin 12.9 (L) 13.0 - 17.0 g/dL   HCT 11.9 (L) 14.7 - 82.9 %   MCV 95.3 80.0 - 100.0 fL   MCH 32.0 26.0 - 34.0 pg   MCHC 33.6 30.0 - 36.0 g/dL  RDW 17.2 (H) 11.5  - 15.5 %   Platelet Count 116 (L) 150 - 400 K/uL   nRBC 0.0 0.0 - 0.2 %   Neutrophils Relative % 65 %   Neutro Abs 2.9 1.7 - 7.7 K/uL   Lymphocytes Relative 12 %   Lymphs Abs 0.6 (L) 0.7 - 4.0 K/uL   Monocytes Relative 12 %   Monocytes Absolute 0.5 0.1 - 1.0 K/uL   Eosinophils Relative 9 %   Eosinophils Absolute 0.4 0.0 - 0.5 K/uL   Basophils Relative 1 %   Basophils Absolute 0.0 0.0 - 0.1 K/uL   Immature Granulocytes 1 %   Abs Immature Granulocytes 0.05 0.00 - 0.07 K/uL       Assessment & Plan:   1. Acute cough URI with bronchospasms He had neb in office with improved respiratory symptoms Tx for bronchitis with prednisone burst, inhaler and CXR if he isn't feeling better in the next 1-2 weeks - albuterol (VENTOLIN HFA) 108 (90 Base) MCG/ACT inhaler; Inhale 2 puffs into the lungs every 6 (six) hours as needed for wheezing or shortness of breath.  Dispense: 8 g; Refill: 0 - predniSONE (DELTASONE) 20 MG tablet; Take 2 tablets (40 mg total) by mouth daily with breakfast for 5 days.  Dispense: 10 tablet; Refill: 0 - benzonatate (TESSALON) 100 MG capsule; Take 1-2 capsules (100-200 mg total) by mouth 3 (three) times daily as needed for cough.  Dispense: 30 capsule; Refill: 0 - DG Chest 2 View - albuterol (PROVENTIL) (2.5 MG/3ML) 0.083% nebulizer solution 2.5 mg      Danelle Berry, PA-C 08/22/23 11:23 AM

## 2023-08-22 NOTE — Telephone Encounter (Signed)
Chief Complaint: Dry Cough Symptoms: Dry cough, runny nose, mild SOB Frequency: Constant  Pertinent Negatives: Patient denies fever, nausea, vomiting, chest pain  Disposition: [] ED /[] Urgent Care (no appt availability in office) / [x] Appointment(In office/virtual)/ []  [facility]/ [] Home Care/ [] Refused Recommended Disposition /[] [facility]/ []  Follow-up with PCP Additional Notes: Patient states he had symptoms of a sinus infection about 4-6 weeks ago. Those symptoms have improved but he continues to have a dry hacking cough for about 7 days now.  Patient has tried over the counter medication but the cough has not improved like his other symptoms. Care advice was given and patient has been scheduled today at [facility] at 1120, no appointments available today at [facility].  Summary: cough/medication request   Patient is experiencing a dry hacking non productive cough and he would like for provider to call in something for his symptoms. Please f/u with patient     Reason for Disposition  [1] Continuous (nonstop) coughing interferes with work or school AND [2] no improvement using cough treatment per Care Advice  Answer Assessment - Initial Assessment Questions 1. ONSET: "When did the cough begin?"      7-10 days ago  2. SEVERITY: "How bad is the cough today?"      Moderate 3. SPUTUM: "Describe the color of your sputum" (none, dry cough; clear, white, yellow, green)     Dry cough 4. HEMOPTYSIS: "Are you coughing up any blood?" If so ask: "How much?" (flecks, streaks, tablespoons, etc.)     None 5. DIFFICULTY BREATHING: "Are you having difficulty breathing?" If Yes, ask: "How bad is it?" (e.g., mild, moderate, severe)    - MILD: No SOB at rest, mild SOB with walking, speaks normally in sentences, can lie down, no retractions, pulse < 100.    - MODERATE: SOB at rest, SOB with minimal exertion and prefers to sit, cannot lie down flat, speaks in phrases, mild retractions,  audible wheezing, pulse 100-120.    - SEVERE: Very SOB at rest, speaks in single words, struggling to breathe, sitting hunched forward, retractions, pulse > 120      Mild SOB 6. FEVER: "Do you have a fever?" If Yes, ask: "What is your temperature, how was it measured, and when did it start?"     No  7. CARDIAC HISTORY: "Do you have any history of heart disease?" (e.g., heart attack, congestive heart failure)      No 8. LUNG HISTORY: "Do you have any history of lung disease?"  (e.g., pulmonary embolus, asthma, emphysema)     No 9. PE RISK FACTORS: "Do you have a history of blood clots?" (or: recent major surgery, recent prolonged travel, bedridden)     No  10. OTHER SYMPTOMS: "Do you have any other symptoms?" (e.g., runny nose, wheezing, chest pain)       Mild runny nose  12. TRAVEL: "Have you traveled out of the country in the last month?" (e.g., travel history, exposures)       No  Protocols used: Cough - Acute Non-Productive-A-AH

## 2023-09-26 DIAGNOSIS — H6123 Impacted cerumen, bilateral: Secondary | ICD-10-CM | POA: Diagnosis not present

## 2023-09-26 DIAGNOSIS — H903 Sensorineural hearing loss, bilateral: Secondary | ICD-10-CM | POA: Diagnosis not present

## 2023-10-06 ENCOUNTER — Encounter: Payer: Self-pay | Admitting: Internal Medicine

## 2023-10-17 ENCOUNTER — Ambulatory Visit
Admission: RE | Admit: 2023-10-17 | Discharge: 2023-10-17 | Disposition: A | Discharge status: Home / Self Care | Payer: Medicare Other | Source: Ambulatory Visit | Attending: Internal Medicine | Admitting: Internal Medicine

## 2023-10-17 DIAGNOSIS — C8590 Non-Hodgkin lymphoma, unspecified, unspecified site: Secondary | ICD-10-CM | POA: Insufficient documentation

## 2023-10-17 DIAGNOSIS — J9811 Atelectasis: Secondary | ICD-10-CM | POA: Diagnosis not present

## 2023-10-17 DIAGNOSIS — N200 Calculus of kidney: Secondary | ICD-10-CM | POA: Diagnosis not present

## 2023-10-17 DIAGNOSIS — R590 Localized enlarged lymph nodes: Secondary | ICD-10-CM | POA: Diagnosis not present

## 2023-10-17 DIAGNOSIS — R591 Generalized enlarged lymph nodes: Secondary | ICD-10-CM | POA: Diagnosis not present

## 2023-10-17 DIAGNOSIS — N2889 Other specified disorders of kidney and ureter: Secondary | ICD-10-CM | POA: Diagnosis not present

## 2023-10-17 DIAGNOSIS — I6529 Occlusion and stenosis of unspecified carotid artery: Secondary | ICD-10-CM | POA: Diagnosis not present

## 2023-10-17 HISTORY — DX: Malignant (primary) neoplasm, unspecified: C80.1

## 2023-10-17 MED ORDER — IOHEXOL 350 MG/ML SOLN
100.0000 mL | Freq: Once | INTRAVENOUS | Status: AC | PRN
  Administered 2023-10-17: 100 mL via INTRAVENOUS

## 2023-10-18 ENCOUNTER — Telehealth: Payer: Self-pay | Admitting: Internal Medicine

## 2023-10-18 NOTE — Telephone Encounter (Signed)
I tried to reach the patient regarding the results of the CT scan.  Left a voicemail to call back.    Patient will likely need a PET scan.  Also send an email to Dr. Noralee Stain.   FYI-

## 2023-10-19 ENCOUNTER — Other Ambulatory Visit: Payer: Self-pay | Admitting: Internal Medicine

## 2023-10-19 ENCOUNTER — Encounter: Payer: Self-pay | Admitting: Internal Medicine

## 2023-10-19 DIAGNOSIS — C8590 Non-Hodgkin lymphoma, unspecified, unspecified site: Secondary | ICD-10-CM

## 2023-10-19 NOTE — Progress Notes (Signed)
Unable to reach the patient the left a voicemail.  Also sent a MyChart message-recommend a PET scan ASAP.  Recommend keep appointment as planned/next week.   Thanks, GB

## 2023-10-24 ENCOUNTER — Inpatient Hospital Stay (HOSPITAL_BASED_OUTPATIENT_CLINIC_OR_DEPARTMENT_OTHER): Payer: Medicare Other | Admitting: Internal Medicine

## 2023-10-24 ENCOUNTER — Inpatient Hospital Stay: Payer: Medicare Other | Attending: Internal Medicine

## 2023-10-24 ENCOUNTER — Encounter: Payer: Self-pay | Admitting: Internal Medicine

## 2023-10-24 VITALS — BP 131/53 | HR 40 | Temp 98.3°F | Ht 72.0 in | Wt 297.0 lb

## 2023-10-24 DIAGNOSIS — C8338 Diffuse large B-cell lymphoma, lymph nodes of multiple sites: Secondary | ICD-10-CM | POA: Diagnosis not present

## 2023-10-24 DIAGNOSIS — K7460 Unspecified cirrhosis of liver: Secondary | ICD-10-CM | POA: Diagnosis not present

## 2023-10-24 DIAGNOSIS — C8590 Non-Hodgkin lymphoma, unspecified, unspecified site: Secondary | ICD-10-CM

## 2023-10-24 DIAGNOSIS — Z7901 Long term (current) use of anticoagulants: Secondary | ICD-10-CM | POA: Insufficient documentation

## 2023-10-24 DIAGNOSIS — R5383 Other fatigue: Secondary | ICD-10-CM | POA: Diagnosis not present

## 2023-10-24 DIAGNOSIS — Z86711 Personal history of pulmonary embolism: Secondary | ICD-10-CM | POA: Insufficient documentation

## 2023-10-24 DIAGNOSIS — D649 Anemia, unspecified: Secondary | ICD-10-CM | POA: Insufficient documentation

## 2023-10-24 LAB — CBC WITH DIFFERENTIAL (CANCER CENTER ONLY)
Abs Immature Granulocytes: 0.02 10*3/uL (ref 0.00–0.07)
Basophils Absolute: 0 10*3/uL (ref 0.0–0.1)
Basophils Relative: 1 %
Eosinophils Absolute: 0.1 10*3/uL (ref 0.0–0.5)
Eosinophils Relative: 3 %
HCT: 28 % — ABNORMAL LOW (ref 39.0–52.0)
Hemoglobin: 8.7 g/dL — ABNORMAL LOW (ref 13.0–17.0)
Immature Granulocytes: 1 %
Lymphocytes Relative: 14 %
Lymphs Abs: 0.6 10*3/uL — ABNORMAL LOW (ref 0.7–4.0)
MCH: 32.1 pg (ref 26.0–34.0)
MCHC: 31.1 g/dL (ref 30.0–36.0)
MCV: 103.3 fL — ABNORMAL HIGH (ref 80.0–100.0)
Monocytes Absolute: 0.7 10*3/uL (ref 0.1–1.0)
Monocytes Relative: 16 %
Neutro Abs: 2.8 10*3/uL (ref 1.7–7.7)
Neutrophils Relative %: 65 %
Platelet Count: 108 10*3/uL — ABNORMAL LOW (ref 150–400)
RBC: 2.71 MIL/uL — ABNORMAL LOW (ref 4.22–5.81)
RDW: 20.7 % — ABNORMAL HIGH (ref 11.5–15.5)
WBC Count: 4.3 10*3/uL (ref 4.0–10.5)
nRBC: 0 % (ref 0.0–0.2)

## 2023-10-24 LAB — CMP (CANCER CENTER ONLY)
ALT: 14 U/L (ref 0–44)
AST: 31 U/L (ref 15–41)
Albumin: 3.9 g/dL (ref 3.5–5.0)
Alkaline Phosphatase: 74 U/L (ref 38–126)
Anion gap: 8 (ref 5–15)
BUN: 22 mg/dL (ref 8–23)
CO2: 27 mmol/L (ref 22–32)
Calcium: 8.9 mg/dL (ref 8.9–10.3)
Chloride: 102 mmol/L (ref 98–111)
Creatinine: 1.04 mg/dL (ref 0.61–1.24)
GFR, Estimated: >60 mL/min (ref >60)
Glucose, Bld: 99 mg/dL (ref 70–99)
Potassium: 4.3 mmol/L (ref 3.5–5.1)
Sodium: 137 mmol/L (ref 135–145)
Total Bilirubin: 2.1 mg/dL — ABNORMAL HIGH (ref <1.2)
Total Protein: 5.8 g/dL — ABNORMAL LOW (ref 6.5–8.1)

## 2023-10-24 LAB — LACTATE DEHYDROGENASE: LDH: 414 U/L — ABNORMAL HIGH (ref 98–192)

## 2023-10-24 NOTE — Progress Notes (Signed)
[facility] CONSULT NOTE  Patient Care Team: Malva Limes, MD as PCP - General (Family Medicine) Lemar Livings, Merrily Pew, MD (General Surgery) Earna Coder, MD as Consulting Physician (Internal Medicine)  CHIEF COMPLAINTS/PURPOSE OF CONSULTATION: Diffuse large B-cell lymphoma  #  Oncology History Overview Note  #June 29, 2019-CT- bulky bilateral neck adenopathy left more than right; September 2020-Diffuse B-cell lymphoma; ABC subtype; NEGATIVE FISH studies.  PET scan -bulky adenopathy involving neck /lymphadenopathy mediastinum /abdomen/pelvis inguinal lymph nodes; splenic involvement.  Question bone.  Stage IV [spleen involvement; no bone marrow biopsy].  BCL-2: Positive /BCL 6: Positive /MUM-1: Positive /CD10: Negative 'Ki-67: High proliferation index (90%) /CD30: Negative /Cyclin D1: Negative C-MYC: Equivocal (20% staining seen on limited and fragmented tissue) /EBER: Negative   # Ritux-Pred- 9/11; 9/18- R-COP [held adria- sec to EF 45-50%]  #May 2021-PET scan left neck recurrent disease; excisional biopsy [Dr. Vaught]-recurrent diffuse left B-cell lymphoma  #November 2020-bilateral PE; on xarelto  #J07/07/2020- 2021- Gem-CarboDex [II opinion [facility]; Dr. Noralee Stain; stem cell transplant eval]; no Rituxan-repeat CD20 negative.   #S/p 2 cycles-gem carbo Dex-June 18, 2020 PET scan-slight increase in SUV uptake of the left cervical lymph nodes; no distant metastatic disease.  Discontinued chemotherapy; refer to CAR-T;  OCT 12th 2021- [facility]- -New extensive cervical, supraclavicular, intrathoracic, axillary and abdominal and pelvic lymph nodes, worrisome for lymphoma involvement. New splenomegaly with new, diffuse uptake, now more intense than the liver, concerning for lymphoma involvement. S/p POLA-BENDA  # CAR-T/Axi-cel on 09/16/20 preceded by fludarabine/cyclophosphamide lymphodepletion.[[facility]; Dr.Grover]; complicated by CRS-G-3 [ICU]; dec 27th-PET CR.   # Sep 2020-  Borderline low EF/Left ventricular hypokinesis [Dr.Gollan]-s October 9th stress test-nonischemic;   # DIAGNOSIS: DLBCL  STAGE:   4      ;GOALS: Cure  CURRENT/MOST RECENT THERAPY : R-COP/-R-CEOP. [C]    Lymphoma of lymph nodes (HCC)  07/03/2019 Initial Diagnosis   Lymphoma of lymph nodes (HCC)   07/13/2019 - 12/07/2019 Chemotherapy   The patient had palonosetron (ALOXI) injection 0.25 mg, 0.25 mg, Intravenous,  Once, 7 of 7 cycles Administration: 0.25 mg (07/20/2019), 0.25 mg (08/13/2019), 0.25 mg (11/13/2019), 0.25 mg (09/03/2019), 0.25 mg (10/02/2019), 0.25 mg (10/23/2019), 0.25 mg (12/04/2019) pegfilgrastim (NEULASTA ONPRO KIT) injection 6 mg, 6 mg, Subcutaneous, Once, 2 of 2 cycles Administration: 6 mg (10/04/2019), 6 mg (10/25/2019) pegfilgrastim-cbqv (UDENYCA) injection 6 mg, 6 mg, Subcutaneous, Once, 5 of 5 cycles Administration: 6 mg (07/23/2019), 6 mg (08/16/2019), 6 mg (09/06/2019), 6 mg (11/16/2019), 6 mg (12/07/2019) vinCRIStine (ONCOVIN) 2 mg in sodium chloride 0.9 % 50 mL chemo infusion, 2 mg, Intravenous,  Once, 7 of 7 cycles Administration: 2 mg (07/20/2019), 2 mg (08/13/2019), 2 mg (11/13/2019), 2 mg (09/03/2019), 2 mg (10/02/2019), 2 mg (10/23/2019), 2 mg (12/04/2019) cyclophosphamide (CYTOXAN) 1,900 mg in sodium chloride 0.9 % 250 mL chemo infusion, 750 mg/m2 = 1,900 mg, Intravenous,  Once, 7 of 7 cycles Administration: 1,900 mg (07/20/2019), 1,900 mg (08/13/2019), 2,000 mg (11/13/2019), 1,900 mg (09/03/2019), 2,000 mg (10/02/2019), 2,000 mg (10/23/2019), 2,000 mg (12/04/2019) etoposide (VEPESID) 250 mg in sodium chloride 0.9 % 1,000 mL chemo infusion, 100 mg/m2 = 250 mg, Intravenous,  Once, 6 of 6 cycles Dose modification: 50 mg/m2 (original dose 100 mg/m2, Cycle 3, Reason: Provider Judgment) Administration: 130 mg (08/14/2019), 130 mg (08/15/2019), 130 mg (09/03/2019), 130 mg (09/04/2019), 130 mg (09/05/2019), 130 mg (10/03/2019), 130 mg (10/04/2019), 130 mg (10/24/2019), 130 mg (10/25/2019), 130 mg  (11/13/2019), 130 mg (11/14/2019), 130 mg (11/15/2019), 130 mg (10/02/2019), 130 mg (10/23/2019),  130 mg (12/04/2019), 130 mg (12/05/2019), 130 mg (12/06/2019) dexamethasone (DECADRON) 12 mg in sodium chloride 0.9 % 145 mL IVPB, , Intravenous,  Once, 8 of 8 cycles Administration:  (07/13/2019),  (07/20/2019),  (08/13/2019),  (11/13/2019),  (09/03/2019),  (10/02/2019),  (10/23/2019),  (12/04/2019) riTUXimab-pvvr (RUXIENCE) 900 mg in sodium chloride 0.9 % 250 mL (2.6471 mg/mL) infusion, 375 mg/m2 = 900 mg (100 % of original dose 375 mg/m2), Intravenous,  Once, 1 of 1 cycle Dose modification: 375 mg/m2 (original dose 375 mg/m2, Cycle 1) Administration: 900 mg (07/13/2019)  for chemotherapy treatment.    05/09/2020 - 06/06/2020 Chemotherapy   The patient had palonosetron (ALOXI) injection 0.25 mg, 0.25 mg, Intravenous,  Once, 2 of 4 cycles Administration: 0.25 mg (05/09/2020), 0.25 mg (05/30/2020) CARBOplatin (PARAPLATIN) 750 mg in sodium chloride 0.9 % 250 mL chemo infusion, 750 mg (100 % of original dose 750 mg), Intravenous,  Once, 2 of 4 cycles Dose modification:   (original dose 750 mg, Cycle 1, Reason: Provider Judgment, Comment: labs from care every where. ) Administration: 750 mg (05/09/2020), 750 mg (05/30/2020) gemcitabine (GEMZAR) 2,500 mg in sodium chloride 0.9 % 250 mL chemo infusion, 2,584 mg, Intravenous,  Once, 2 of 4 cycles Administration: 2,500 mg (05/09/2020), 2,500 mg (05/16/2020), 2,500 mg (05/30/2020), 2,500 mg (06/06/2020) fosaprepitant (EMEND) 150 mg in sodium chloride 0.9 % 145 mL IVPB, 150 mg, Intravenous,  Once, 2 of 4 cycles Administration: 150 mg (05/09/2020), 150 mg (05/30/2020)  for chemotherapy treatment.    08/27/2020 - 08/27/2020 Chemotherapy   Patient is on Treatment Plan : NON-HODGKINS LYMPHOMA RELAPSED/ REFRACTORY DLBCL Polatuzumab + Bendamustine + Rituximab q21d x 6 cycles     11/13/2020 Cancer Staging   Staging form: Hodgkin and Non-Hodgkin Lymphoma, AJCC 8th Edition - Clinical: Stage IV  (Diffuse large B-cell lymphoma) - Signed by Earna Coder, MD on 11/13/2020     HISTORY OF PRESENTING ILLNESS: Ambulating independently.  Alone.  Eric [name] 72 y.o.  male relapsed/refractory diffuse large B-cell lymphoma currently s/p CART therapy at [facility] in November 2021 is here for follow-up-in review results of the CT scan.  Patient complains of worsening fatigue.  Progressively getting worse.   Patient continues to deny any new lumps or bumps.  Appetite is good.  Noticed 10 pounds weight loss.  No night sweats.  No fever no chills.  Review of Systems  Constitutional:  Negative for chills, diaphoresis and fever.  HENT:  Positive for hearing loss. Negative for nosebleeds and sore throat.   Eyes:  Negative for double vision.  Respiratory:  Negative for hemoptysis, sputum production and wheezing.   Cardiovascular:  Negative for chest pain, palpitations, orthopnea and leg swelling.  Gastrointestinal:  Negative for abdominal pain, blood in stool, constipation, diarrhea, heartburn, melena, nausea and vomiting.  Genitourinary:  Negative for dysuria, frequency and urgency.  Musculoskeletal:  Negative for back pain and joint pain.  Skin: Negative.  Negative for itching and rash.  Neurological:  Negative for dizziness, tingling, focal weakness, weakness and headaches.  Endo/Heme/Allergies:  Does not bruise/bleed easily.  Psychiatric/Behavioral:  Negative for depression. The patient does not have insomnia.      MEDICAL HISTORY:  Past Medical History:  Diagnosis Date   Adopted person    Cancer [facility])    Colon polyps    Pancreatitis    Rectal bleeding 02/24/2014   Sepsis (HCC) 09/18/2019    SURGICAL HISTORY: Past Surgical History:  Procedure Laterality Date   CHOLECYSTECTOMY     COLONOSCOPY  02/28/14  IR IMAGING GUIDED PORT INSERTION  07/18/2019   IR REMOVAL TUN ACCESS W/ PORT W/O FL MOD SED  05/29/2021   LYMPH NODE BIOPSY Left 03/28/2020   Procedure: EXCISION OF SUBMANDIBULAR   LYMPH NODE;  Surgeon: Bud Face, MD;  Location: [facility];  Service: ENT;  Laterality: Left;   POLYPECTOMY     colon polyp removed   TONSILLECTOMY      SOCIAL HISTORY: Social History   Socioeconomic History   Marital status: Married    Spouse name: Not on file   Number of children: Not on file   Years of education: Not on file   Highest education level: 12th grade  Occupational History    Comment: Retired from [employer]  Tobacco Use   Smoking status: Every Day    Current packs/day: 1.00    Average packs/day: 1 pack/day for 20.0 years (20.0 ttl pk-yrs)    Types: Cigars, Cigarettes   Smokeless tobacco: Never   Tobacco comments:    smoke 1/2 ppd cigaretes since 72 yo. changed to cigars at age July 2004.   Vaping Use   Vaping status: Never Used  Substance and Sexual Activity   Alcohol use: Yes    Comment: 6-7 drinks weekly   Drug use: No   Sexual activity: Not on file  Other Topics Concern   Not on file  Social History Narrative   Lives at home with wife; in [place]. retd from [employer]. Cigar smoker; drinks liquor. Children - grown up.    Social Drivers of Corporate investment banker Strain: Low Risk  (08/22/2023)   Overall Financial Resource Strain (CARDIA)    Difficulty of Paying Living Expenses: Not hard at all  Food Insecurity: No Food Insecurity (08/22/2023)   Hunger Vital Sign    Worried About Running Out of Food in the Last Year: Never true    Ran Out of Food in the Last Year: Never true  Transportation Needs: No Transportation Needs (08/22/2023)   PRAPARE - Administrator, Civil Service (Medical): No    Lack of Transportation (Non-Medical): No  Physical Activity: Insufficiently Active (08/22/2023)   Exercise Vital Sign    Days of Exercise per Week: 1 day    Minutes of Exercise per Session: 10 min  Stress: No Stress Concern Present (08/22/2023)   [employer] of Occupational Health - Occupational Stress Questionnaire     Feeling of Stress : Only a little  Social Connections: Moderately Isolated (08/22/2023)   Social Connection and Isolation Panel [NHANES]    Frequency of Communication with Friends and Family: Once a week    Frequency of Social Gatherings with Friends and Family: Twice a week    Attends Religious Services: Never    Database administrator or Organizations: No    Attends Engineer, structural: Not on file    Marital Status: Married  Catering manager Violence: Not on file    FAMILY HISTORY: Family History  Adopted: Yes    ALLERGIES:  is allergic to allopurinol, azithromycin, and losartan potassium.  MEDICATIONS:  Current Outpatient Medications  Medication Sig Dispense Refill   Multiple Vitamin (MULTI-VITAMIN) tablet Take 1 tablet by mouth daily.     No current facility-administered medications for this visit.      Marland Kitchen  PHYSICAL EXAMINATION: ECOG PERFORMANCE STATUS: 1 - Symptomatic but completely ambulatory  Vitals:   10/24/23 1251  BP: (!) 131/53  Pulse: (!) 40  Temp: 98.3 F (36.8 C)  SpO2:  99%   Filed Weights   10/24/23 1251  Weight: 297 lb (134.7 kg)    Physical Exam HENT:     Head: Normocephalic and atraumatic.     Mouth/Throat:     Pharynx: No oropharyngeal exudate.  Eyes:     Pupils: Pupils are equal, round, and reactive to light.  Cardiovascular:     Rate and Rhythm: Normal rate and regular rhythm.  Pulmonary:     Effort: Pulmonary effort is normal. No respiratory distress.     Breath sounds: Normal breath sounds. No wheezing.  Abdominal:     General: Bowel sounds are normal. There is no distension.     Palpations: Abdomen is soft. There is no mass.     Tenderness: There is no abdominal tenderness. There is no guarding or rebound.  Musculoskeletal:        General: No tenderness. Normal range of motion.     Cervical back: Normal range of motion and neck supple.  Skin:    General: Skin is warm.  Neurological:     Mental Status: He is alert  and oriented to person, place, and time.  Psychiatric:        Mood and Affect: Affect normal.    LABORATORY DATA:  I have reviewed the data as listed Lab Results  Component Value Date   WBC 4.3 10/24/2023   HGB 8.7 (L) 10/24/2023   HCT 28.0 (L) 10/24/2023   MCV 103.3 (H) 10/24/2023   PLT 108 (L) 10/24/2023   Recent Labs    06/15/23 0955 10/24/23 1254  NA 140 137  K 4.1 4.3  CL 105 102  CO2 27 27  GLUCOSE 106* 99  BUN 21 22  CREATININE 0.98 1.04  CALCIUM 8.9 8.9  GFRNONAA >60 >60  PROT 6.1* 5.8*  ALBUMIN 4.1 3.9  AST 23 31  ALT 25 14  ALKPHOS 87 74  BILITOT 1.2 2.1*    RADIOGRAPHIC STUDIES: I have personally reviewed the radiological images as listed and agreed with the findings in the report. CT CHEST ABDOMEN PELVIS W CONTRAST Result Date: 10/17/2023 CLINICAL DATA:  History of diffuse large B-cell lymphoma, follow-up. * Tracking Code: BO * EXAM: CT CHEST, ABDOMEN, AND PELVIS WITH CONTRAST TECHNIQUE: Multidetector CT imaging of the chest, abdomen and pelvis was performed following the standard protocol during bolus administration of intravenous contrast. RADIATION DOSE REDUCTION: This exam was performed according to the departmental dose-optimization program which includes automated exposure control, adjustment of the mA and/or kV according to patient size and/or use of iterative reconstruction technique. CONTRAST:  OMNIPAQUE IOHEXOL 350 MG/ML SOLN COMPARISON:  Multiple priors including most recent PET-CT June 18 2020. FINDINGS: Please refer to same day neck CT for findings above the thoracic inlet. CT CHEST FINDINGS Cardiovascular: Normal caliber thoracic aorta. Aortic atherosclerosis. No central pulmonary embolus on this nondedicated study. Borderline cardiac enlargement. Three-vessel coronary artery calcifications Mediastinum/Nodes: Slight decreased size of bilateral axillary lymph nodes with increased size of prominent/mildly enlarged mediastinal lymph nodes and  hilar nodal tissue. For reference: -high right paratracheal lymph node measures 12 mm on image 40/3 previously 4 mm. -subcarinal lymph node measures 17 mm in short axis on image 62/3 previously 3 mm. -right axillary lymph node measures 7 mm in short axis on image 40/3 previously 11 mm Lungs/Pleura: No suspicious pulmonary nodules or masses. Scattered atelectasis/scarring. Musculoskeletal: No aggressive lytic or blastic lesion of bone multilevel degenerative change of the spine. CT ABDOMEN PELVIS FINDINGS Hepatobiliary: Hepatic morphologic changes compatible  with cirrhosis. Calcified subcapsular hepatic lesions. Gallbladder surgically absent. No biliary ductal dilation. Pancreas: No pancreatic ductal dilation or evidence of acute inflammation. Spleen: Splenomegaly measuring 22.7 cm in maximum axial dimension. Adrenals/Urinary Tract: Bilateral adrenal glands appear normal. 19 mm left lower pole renal stone with a associated caliectasis. Additional nonobstructive left renal stones measure up to 7 mm. Punctate nonobstructive right renal stones. Left upper pole renal cyst measures fluid density is considered benign requiring no independent imaging follow-up. Urinary bladder is unremarkable for degree of distension Stomach/Bowel: Radiopaque enteric contrast material traverses distal loops of small bowel. Stomach is unremarkable for degree of distension. No pathologic dilation of large or small bowel colonic diverticulosis without findings of acute diverticulitis. Vascular/Lymphatic: Aortic atherosclerosis. Smooth IVC contours. The portal, splenic and superior mesenteric veins are patent. Portosystemic collateral vessels. Prominent periportal, gastrohepatic, hepatoduodenal ligament lymph nodes are progressive from prior. For reference: -a 15 mm short axis periportal lymph node on image 151/3 Increased size of mesenteric lymph nodes for instance a lymph node in the jejunal mesentery measuring 17 mm in short axis on image  199/3. Prominent/mildly enlarged scattered retroperitoneal lymph nodes for instance a 9 mm short axis aortocaval lymph node on image 165/3 and a 11 mm short axis left external iliac lymph node on image 282/3 Reproductive: Normal size prostate gland. Other: No significant abdominopelvic free fluid. Musculoskeletal: Multilevel degenerative change of the spine. IMPRESSION: 1. Increased adenopathy above and below the diaphragm, concerning for progressive lymphoma. 2. Splenomegaly may reflects sequela of portal venous hypertension or lymphomatous disease involvement. 3. Hepatic morphologic changes compatible with cirrhosis with sequela of portal hypertension. 4. 19 mm left lower pole renal stone with associated caliectasis. Additional nonobstructive left renal stones measure up to 7 mm. Punctate nonobstructive right renal stones. 5. Colonic diverticulosis without findings of acute diverticulitis. 6. Aortic Atherosclerosis (ICD10-I70.0). Electronically Signed   By: Maudry Mayhew M.D.   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  CT scan: DEC 2024- Increased adenopathy above and below the diaphragm, concerning for progressive lymphoma; Splenomegaly may reflects sequela of portal venous hypertension or lymphomatous disease involvement;  Hepatic morphologic changes compatible with cirrhosis with sequela of portal hypertension. CT neck-pending.   # I reviewed the current findings on the CT scan with the patient and wife in detail.  Recommend a PET scan-pending on December 30.  Discussed with the patient that he will most likely be recommended a biopsy to confirm recurrence concerning findings on the scan.  # Worsening anemia-hemoglobin 8.7 platelets-116-again concerning for lymphoma involvement.  Will monitor closely.  Consider bone marrow biopsy.  # Borderline LOW EF of  45-50%; Sep Echo 50-55% [Dr.Gollan]- on coreg BID -stable  # Infectious Disease/Prophylaxis: Currently OFF valtrex/Bactrim DS. Check CD 4 counts.   # Cirrhosis- based Imaging [No decompensation]-no prior history of hepatitis B or C.  # DISPOSITION:  # follow up 3 weeks- MD; labs- cbc/cmp/ldh; uric acid-hold tube- Dr.B  All questions were answered. The patient knows to call the clinic with any problems, questions or concerns.    Earna Coder, MD 10/24/2023 2:55 PM

## 2023-10-24 NOTE — Assessment & Plan Note (Addendum)
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  CT scan: DEC 2024- Increased adenopathy above and below the diaphragm, concerning for progressive lymphoma; Splenomegaly may reflects sequela of portal venous hypertension or lymphomatous disease involvement;  Hepatic morphologic changes compatible with cirrhosis with sequela of portal hypertension. CT neck-pending.   # I reviewed the current findings on the CT scan with the patient and wife in detail.  Recommend a PET scan-pending on December 30.  Discussed with the patient that he will most likely be recommended a biopsy to confirm recurrence concerning findings on the scan.  I have sent a message to staff-to follow-up on the PET scan/biopsy.   # Worsening anemia-hemoglobin 8.7 platelets-116-again concerning for lymphoma involvement.  Will monitor closely.  Consider bone marrow biopsy.  # Borderline LOW EF of 45-50%; Sep Echo 50-55% [Dr.Gollan]- on coreg BID -stable  # Infectious Disease/Prophylaxis: Currently OFF valtrex/Bactrim DS. Check CD 4 counts.   # Cirrhosis- based Imaging [No decompensation]-no prior history of hepatitis B or C.  # DISPOSITION:  # follow up 3 weeks- MD; labs- cbc/cmp/ldh; uric acid-hold tube- Dr.B

## 2023-10-24 NOTE — Progress Notes (Signed)
CT neck 10/17/23.  C/o taking a nap around 6pm, x6 months, never been one to nap, getting tired more, less stamina.

## 2023-10-25 LAB — T-HELPER CELLS CD4/CD8 %
% CD 4 Pos. Lymph.: 28.7 % — ABNORMAL LOW (ref 30.8–58.5)
Absolute CD 4 Helper: 172 /uL — ABNORMAL LOW (ref 359–1519)
Basophils Absolute: 0 10*3/uL (ref 0.0–0.2)
Basos: 1 %
CD3+CD4+ Cells/CD3+CD8+ Cells Bld: 0.8 — ABNORMAL LOW (ref 0.92–3.72)
CD3+CD8+ Cells # Bld: 215 /uL (ref 109–897)
CD3+CD8+ Cells NFr Bld: 35.9 % — ABNORMAL HIGH (ref 12.0–35.5)
EOS (ABSOLUTE): 0.1 10*3/uL (ref 0.0–0.4)
Eos: 3 %
Hematocrit: 25.5 % — ABNORMAL LOW (ref 37.5–51.0)
Hemoglobin: 8.1 g/dL — ABNORMAL LOW (ref 13.0–17.7)
Immature Grans (Abs): 0 10*3/uL (ref 0.0–0.1)
Immature Granulocytes: 1 %
Lymphocytes Absolute: 0.6 10*3/uL — ABNORMAL LOW (ref 0.7–3.1)
Lymphs: 14 %
MCH: 31.6 pg (ref 26.6–33.0)
MCHC: 31.8 g/dL (ref 31.5–35.7)
MCV: 100 fL — ABNORMAL HIGH (ref 79–97)
Monocytes Absolute: 0.7 10*3/uL (ref 0.1–0.9)
Monocytes: 17 %
Neutrophils Absolute: 2.8 10*3/uL (ref 1.4–7.0)
Neutrophils: 64 %
Platelets: 110 10*3/uL — ABNORMAL LOW (ref 150–450)
RBC: 2.56 x10E6/uL — ABNORMAL LOW (ref 4.14–5.80)
RDW: 17.9 % — ABNORMAL HIGH (ref 11.6–15.4)
WBC: 4.3 10*3/uL (ref 3.4–10.8)

## 2023-10-31 ENCOUNTER — Ambulatory Visit
Admission: RE | Admit: 2023-10-31 | Discharge: 2023-10-31 | Disposition: A | Discharge status: Home / Self Care | Payer: Medicare Other | Source: Ambulatory Visit | Attending: Internal Medicine | Admitting: Internal Medicine

## 2023-10-31 DIAGNOSIS — C8593 Non-Hodgkin lymphoma, unspecified, intra-abdominal lymph nodes: Secondary | ICD-10-CM | POA: Diagnosis not present

## 2023-10-31 DIAGNOSIS — R590 Localized enlarged lymph nodes: Secondary | ICD-10-CM | POA: Diagnosis not present

## 2023-10-31 DIAGNOSIS — N200 Calculus of kidney: Secondary | ICD-10-CM | POA: Insufficient documentation

## 2023-10-31 DIAGNOSIS — R918 Other nonspecific abnormal finding of lung field: Secondary | ICD-10-CM | POA: Insufficient documentation

## 2023-10-31 DIAGNOSIS — C8590 Non-Hodgkin lymphoma, unspecified, unspecified site: Secondary | ICD-10-CM

## 2023-10-31 DIAGNOSIS — R161 Splenomegaly, not elsewhere classified: Secondary | ICD-10-CM | POA: Diagnosis not present

## 2023-10-31 DIAGNOSIS — I700 Atherosclerosis of aorta: Secondary | ICD-10-CM | POA: Diagnosis not present

## 2023-10-31 LAB — GLUCOSE, CAPILLARY: Glucose-Capillary: 87 mg/dL (ref 70–99)

## 2023-10-31 MED ORDER — FLUDEOXYGLUCOSE F - 18 (FDG) INJECTION
12.8400 | Freq: Once | INTRAVENOUS | Status: AC | PRN
  Administered 2023-10-31: 12.84 via INTRAVENOUS

## 2023-11-04 ENCOUNTER — Telehealth: Payer: Self-pay | Admitting: Nurse Practitioner

## 2023-11-04 NOTE — Telephone Encounter (Signed)
 Called patient to review pet. Findings consistent with recurrent disease. Dr Donneta Romberg recommends biopsy. I've reached out to IR for possible targets. Emailed Dr Noralee Stain with pet results and awaiting her recommendation for possible bone marrow biopsy.

## 2023-11-07 ENCOUNTER — Other Ambulatory Visit: Payer: Self-pay | Admitting: Nurse Practitioner

## 2023-11-07 ENCOUNTER — Telehealth: Payer: Self-pay | Admitting: *Deleted

## 2023-11-07 DIAGNOSIS — C8330 Diffuse large B-cell lymphoma, unspecified site: Secondary | ICD-10-CM

## 2023-11-07 NOTE — Telephone Encounter (Signed)
 Faxed form to IR for cervical lymph node biopsy.

## 2023-11-09 ENCOUNTER — Encounter: Payer: Self-pay | Admitting: *Deleted

## 2023-11-09 ENCOUNTER — Telehealth: Payer: Self-pay | Admitting: *Deleted

## 2023-11-09 NOTE — Progress Notes (Signed)
 Sterling Big, MD sent to [facility] Approved for US guided core biopsy of right supraclavicular LN.  Lymphoma work-up, hx DLBCL  HKM

## 2023-11-09 NOTE — Telephone Encounter (Signed)
 Cervical Lymph node biopsy is scheduled for Thur 11/17/23 at 1pm . [facility]. He does not need  a driver. He can eat prior to procedure. Pt agreeable to above.

## 2023-11-11 ENCOUNTER — Other Ambulatory Visit: Payer: Self-pay

## 2023-11-11 DIAGNOSIS — C8330 Diffuse large B-cell lymphoma, unspecified site: Secondary | ICD-10-CM

## 2023-11-14 ENCOUNTER — Inpatient Hospital Stay: Payer: Medicare Other | Attending: Internal Medicine

## 2023-11-14 ENCOUNTER — Encounter: Payer: Self-pay | Admitting: Internal Medicine

## 2023-11-14 ENCOUNTER — Inpatient Hospital Stay: Payer: Medicare Other | Admitting: Internal Medicine

## 2023-11-14 ENCOUNTER — Telehealth: Payer: Self-pay | Admitting: Emergency Medicine

## 2023-11-14 ENCOUNTER — Other Ambulatory Visit: Payer: Self-pay | Admitting: Emergency Medicine

## 2023-11-14 VITALS — BP 111/76 | HR 98 | Temp 98.8°F | Resp 18 | Wt 293.0 lb

## 2023-11-14 DIAGNOSIS — R918 Other nonspecific abnormal finding of lung field: Secondary | ICD-10-CM | POA: Insufficient documentation

## 2023-11-14 DIAGNOSIS — C8590 Non-Hodgkin lymphoma, unspecified, unspecified site: Secondary | ICD-10-CM | POA: Diagnosis not present

## 2023-11-14 DIAGNOSIS — Z5112 Encounter for antineoplastic immunotherapy: Secondary | ICD-10-CM | POA: Diagnosis not present

## 2023-11-14 DIAGNOSIS — I420 Dilated cardiomyopathy: Secondary | ICD-10-CM

## 2023-11-14 DIAGNOSIS — Z7901 Long term (current) use of anticoagulants: Secondary | ICD-10-CM | POA: Diagnosis not present

## 2023-11-14 DIAGNOSIS — Z86711 Personal history of pulmonary embolism: Secondary | ICD-10-CM | POA: Diagnosis not present

## 2023-11-14 DIAGNOSIS — K7460 Unspecified cirrhosis of liver: Secondary | ICD-10-CM | POA: Diagnosis not present

## 2023-11-14 DIAGNOSIS — D649 Anemia, unspecified: Secondary | ICD-10-CM | POA: Insufficient documentation

## 2023-11-14 DIAGNOSIS — C8330 Diffuse large B-cell lymphoma, unspecified site: Secondary | ICD-10-CM

## 2023-11-14 DIAGNOSIS — C83398 Diffuse large b-cell lymphoma of other extranodal and solid organ sites: Secondary | ICD-10-CM | POA: Diagnosis not present

## 2023-11-14 LAB — CMP (CANCER CENTER ONLY)
ALT: 17 U/L (ref 0–44)
AST: 34 U/L (ref 15–41)
Albumin: 3.6 g/dL (ref 3.5–5.0)
Alkaline Phosphatase: 76 U/L (ref 38–126)
Anion gap: 9 (ref 5–15)
BUN: 24 mg/dL — ABNORMAL HIGH (ref 8–23)
CO2: 29 mmol/L (ref 22–32)
Calcium: 9 mg/dL (ref 8.9–10.3)
Chloride: 101 mmol/L (ref 98–111)
Creatinine: 1.06 mg/dL (ref 0.61–1.24)
GFR, Estimated: >60 mL/min (ref >60)
Glucose, Bld: 103 mg/dL — ABNORMAL HIGH (ref 70–99)
Potassium: 4.4 mmol/L (ref 3.5–5.1)
Sodium: 139 mmol/L (ref 135–145)
Total Bilirubin: 1.9 mg/dL — ABNORMAL HIGH (ref 0.0–1.2)
Total Protein: 5.5 g/dL — ABNORMAL LOW (ref 6.5–8.1)

## 2023-11-14 LAB — CBC WITH DIFFERENTIAL (CANCER CENTER ONLY)
Abs Immature Granulocytes: 0.02 10*3/uL (ref 0.00–0.07)
Basophils Absolute: 0 10*3/uL (ref 0.0–0.1)
Basophils Relative: 1 %
Eosinophils Absolute: 0.2 10*3/uL (ref 0.0–0.5)
Eosinophils Relative: 4 %
HCT: 27.3 % — ABNORMAL LOW (ref 39.0–52.0)
Hemoglobin: 8.5 g/dL — ABNORMAL LOW (ref 13.0–17.0)
Immature Granulocytes: 0 %
Lymphocytes Relative: 10 %
Lymphs Abs: 0.5 10*3/uL — ABNORMAL LOW (ref 0.7–4.0)
MCH: 31.4 pg (ref 26.0–34.0)
MCHC: 31.1 g/dL (ref 30.0–36.0)
MCV: 100.7 fL — ABNORMAL HIGH (ref 80.0–100.0)
Monocytes Absolute: 0.8 10*3/uL (ref 0.1–1.0)
Monocytes Relative: 16 %
Neutro Abs: 3.6 10*3/uL (ref 1.7–7.7)
Neutrophils Relative %: 69 %
Platelet Count: 98 10*3/uL — ABNORMAL LOW (ref 150–400)
RBC: 2.71 MIL/uL — ABNORMAL LOW (ref 4.22–5.81)
RDW: 21 % — ABNORMAL HIGH (ref 11.5–15.5)
WBC Count: 5.1 10*3/uL (ref 4.0–10.5)
nRBC: 0 % (ref 0.0–0.2)

## 2023-11-14 LAB — URIC ACID: Uric Acid, Serum: 10.5 mg/dL — ABNORMAL HIGH (ref 3.7–8.6)

## 2023-11-14 LAB — SAMPLE TO BLOOD BANK

## 2023-11-14 LAB — LACTATE DEHYDROGENASE: LDH: 444 U/L — ABNORMAL HIGH (ref 98–192)

## 2023-11-14 NOTE — Telephone Encounter (Signed)
 Called patient and left message in regards to the following from Dr. Mariah Milling.  Eric [name] can we order echo given history of cardiomyopathy. Then follow-up with me in clinic after echo is completed

## 2023-11-14 NOTE — Assessment & Plan Note (Addendum)
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  DEC 30th, 2024- Extensive tracer avid adenopathy identified within the neck, chest, abdomen and pelvis. Findings are compatible with recurrent lymphoma. Marked splenomegaly with numerous tracer avid lesions scattered throughout the spleen. Too numerous to count.  Marked increased radiotracer uptake within the posterior nasopharynx with corresponding increased soft tissue thickening. Bilateral increased uptake identified within the tonsils and base of tongue.  Small tiny lung nodules are identified which exhibit mild increased tracer uptake. Mild diffuse increased uptake identified throughout the bone marrow without focality.   # Worsening anemia-hemoglobin 8.5 platelets-98 -again concerning for lymphoma involvement.  Will monitor closely.  HOLD off bone marrow biopsy.  # Borderline LOW EF of 45-50%; Sep Echo 50-55% [Dr.Gollan]- on coreg  BID -stable; discussed with Dr. Sloan repeat 2D echo; repeat cardiac evaluation.  # Infectious Disease/Prophylaxis: Currently OFF valtrex /Bactrim  DS. Check CD 4 counts.   # Cirrhosis- based Imaging [No decompensation]-no prior history of hepatitis B or C.  # Elevated uric acid-10- [ Hx of rash to allopurinol ]  # PIV- s/p port explanation  # DISPOSITION:  # follow up 1 week- MD; labs- cbc/cmp/ldh; uric acid; haptoglobin-hold tube- Dr.B  # I reviewed the blood work- with the patient in detail; also reviewed the imaging independently [as summarized above]; and with the patient in detail.

## 2023-11-14 NOTE — Progress Notes (Signed)
 Pt having LN biopsy on 11/16/22. Does have stress regarding all of his scan results. Denies night sweats. Has occ warm sensation involving his ears. Recovering from a sinus infection. Denies any pain.

## 2023-11-14 NOTE — Progress Notes (Signed)
 [facility] CONSULT NOTE  Patient Care Team: Gasper Nancyann BRAVO, MD as PCP - General (Family Medicine) [name], [name] ORN, MD (General Surgery) Rennie Cindy SAUNDERS, MD as Consulting Physician (Internal Medicine)  CHIEF COMPLAINTS/PURPOSE OF CONSULTATION: Diffuse large B-cell lymphoma  #  Oncology History Overview Note  #June 29, 2019-CT- bulky bilateral neck adenopathy left more than right; September 2020-Diffuse B-cell lymphoma; ABC subtype; NEGATIVE FISH studies.  PET scan -bulky adenopathy involving neck /lymphadenopathy mediastinum /abdomen/pelvis inguinal lymph nodes; splenic involvement.  Question bone.  Stage IV [spleen involvement; no bone marrow biopsy].  BCL-2: Positive /BCL 6: Positive /MUM-1: Positive /CD10: Negative 'Ki-67: High proliferation index (90%) /CD30: Negative /Cyclin D1: Negative C-MYC: Equivocal (20% staining seen on limited and fragmented tissue) /EBER: Negative   # Ritux-Pred- 9/11; 9/18- R-COP [held adria- sec to EF 45-50%]  #May 2021-PET scan left neck recurrent disease; excisional biopsy [Dr. Vaught]-recurrent diffuse left B-cell lymphoma  #November 2020-bilateral PE; on xarelto   #J07/07/2020- 2021- Gem-CarboDex [II opinion [facility]; Dr. Claudean; stem cell transplant eval]; no Rituxan -repeat CD20 negative.   #S/p 2 cycles-gem carbo Dex-June 18, 2020 PET scan-slight increase in SUV uptake of the left cervical lymph nodes; no distant metastatic disease.  Discontinued chemotherapy; refer to CAR-T;  OCT 12th 2021- [facility]- -New extensive cervical, supraclavicular, intrathoracic, axillary and abdominal and pelvic lymph nodes, worrisome for lymphoma involvement. New splenomegaly with new, diffuse uptake, now more intense than the liver, concerning for lymphoma involvement. S/p POLA-BENDA  # CAR-T/Axi-cel on 09/16/20 preceded by fludarabine /cyclophosphamide  lymphodepletion.[[facility]; Dr.Grover]; complicated by CRS-G-3 [ICU]; dec 27th-PET CR.   # Sep 2020-  Borderline low EF/Left ventricular hypokinesis [Dr.Gollan]-s October 9th stress test-nonischemic;   # DIAGNOSIS: DLBCL  STAGE:   4      ;GOALS: Cure  CURRENT/MOST RECENT THERAPY : R-COP/-R-CEOP. [C]    Lymphoma of lymph nodes (HCC)  07/03/2019 Initial Diagnosis   Lymphoma of lymph nodes (HCC)   07/13/2019 - 12/07/2019 Chemotherapy   The patient had palonosetron  (ALOXI ) injection 0.25 mg, 0.25 mg, Intravenous,  Once, 7 of 7 cycles Administration: 0.25 mg (07/20/2019), 0.25 mg (08/13/2019), 0.25 mg (11/13/2019), 0.25 mg (09/03/2019), 0.25 mg (10/02/2019), 0.25 mg (10/23/2019), 0.25 mg (12/04/2019) pegfilgrastim  (NEULASTA  ONPRO KIT) injection 6 mg, 6 mg, Subcutaneous, Once, 2 of 2 cycles Administration: 6 mg (10/04/2019), 6 mg (10/25/2019) pegfilgrastim -cbqv (UDENYCA ) injection 6 mg, 6 mg, Subcutaneous, Once, 5 of 5 cycles Administration: 6 mg (07/23/2019), 6 mg (08/16/2019), 6 mg (09/06/2019), 6 mg (11/16/2019), 6 mg (12/07/2019) vinCRIStine  (ONCOVIN ) 2 mg in sodium chloride  0.9 % 50 mL chemo infusion, 2 mg, Intravenous,  Once, 7 of 7 cycles Administration: 2 mg (07/20/2019), 2 mg (08/13/2019), 2 mg (11/13/2019), 2 mg (09/03/2019), 2 mg (10/02/2019), 2 mg (10/23/2019), 2 mg (12/04/2019) cyclophosphamide  (CYTOXAN ) 1,900 mg in sodium chloride  0.9 % 250 mL chemo infusion, 750 mg/m2 = 1,900 mg, Intravenous,  Once, 7 of 7 cycles Administration: 1,900 mg (07/20/2019), 1,900 mg (08/13/2019), 2,000 mg (11/13/2019), 1,900 mg (09/03/2019), 2,000 mg (10/02/2019), 2,000 mg (10/23/2019), 2,000 mg (12/04/2019) etoposide  (VEPESID ) 250 mg in sodium chloride  0.9 % 1,000 mL chemo infusion, 100 mg/m2 = 250 mg, Intravenous,  Once, 6 of 6 cycles Dose modification: 50 mg/m2 (original dose 100 mg/m2, Cycle 3, Reason: Provider Judgment) Administration: 130 mg (08/14/2019), 130 mg (08/15/2019), 130 mg (09/03/2019), 130 mg (09/04/2019), 130 mg (09/05/2019), 130 mg (10/03/2019), 130 mg (10/04/2019), 130 mg (10/24/2019), 130 mg (10/25/2019), 130 mg  (11/13/2019), 130 mg (11/14/2019), 130 mg (11/15/2019), 130 mg (10/02/2019), 130 mg (10/23/2019),  130 mg (12/04/2019), 130 mg (12/05/2019), 130 mg (12/06/2019) dexamethasone  (DECADRON ) 12 mg in sodium chloride  0.9 % 145 mL IVPB, , Intravenous,  Once, 8 of 8 cycles Administration:  (07/13/2019),  (07/20/2019),  (08/13/2019),  (11/13/2019),  (09/03/2019),  (10/02/2019),  (10/23/2019),  (12/04/2019) riTUXimab -pvvr (RUXIENCE ) 900 mg in sodium chloride  0.9 % 250 mL (2.6471 mg/mL) infusion, 375 mg/m2 = 900 mg (100 % of original dose 375 mg/m2), Intravenous,  Once, 1 of 1 cycle Dose modification: 375 mg/m2 (original dose 375 mg/m2, Cycle 1) Administration: 900 mg (07/13/2019)  for chemotherapy treatment.    05/09/2020 - 06/06/2020 Chemotherapy   The patient had palonosetron  (ALOXI ) injection 0.25 mg, 0.25 mg, Intravenous,  Once, 2 of 4 cycles Administration: 0.25 mg (05/09/2020), 0.25 mg (05/30/2020) CARBOplatin  (PARAPLATIN ) 750 mg in sodium chloride  0.9 % 250 mL chemo infusion, 750 mg (100 % of original dose 750 mg), Intravenous,  Once, 2 of 4 cycles Dose modification:   (original dose 750 mg, Cycle 1, Reason: Provider Judgment, Comment: labs from care every where. ) Administration: 750 mg (05/09/2020), 750 mg (05/30/2020) gemcitabine  (GEMZAR ) 2,500 mg in sodium chloride  0.9 % 250 mL chemo infusion, 2,584 mg, Intravenous,  Once, 2 of 4 cycles Administration: 2,500 mg (05/09/2020), 2,500 mg (05/16/2020), 2,500 mg (05/30/2020), 2,500 mg (06/06/2020) fosaprepitant  (EMEND ) 150 mg in sodium chloride  0.9 % 145 mL IVPB, 150 mg, Intravenous,  Once, 2 of 4 cycles Administration: 150 mg (05/09/2020), 150 mg (05/30/2020)  for chemotherapy treatment.    08/27/2020 - 08/27/2020 Chemotherapy   Patient is on Treatment Plan : NON-HODGKINS LYMPHOMA RELAPSED/ REFRACTORY DLBCL Polatuzumab + Bendamustine  + Rituximab  q21d x 6 cycles     11/13/2020 Cancer Staging   Staging form: Hodgkin and Non-Hodgkin Lymphoma, AJCC 8th Edition - Clinical: Stage IV  (Diffuse large B-cell lymphoma) - Signed by Renley Gutman R, MD on 11/13/2020     HISTORY OF PRESENTING ILLNESS: Ambulating independently.  Alone.  Norleen CHRISTELLA Edison 73 y.o.  male relapsed/refractory diffuse large B-cell lymphoma currently s/p CART therapy at [facility] in November 2021 is here for follow-up-in review results of the PET scan.  Pt having LN biopsy on 11/16/22. Does have stress regarding all of his scan results. Denies night sweats. Has occ warm sensation involving his ears. Recovering from a sinus infection. Denies any pain.  Patient complains of worsening fatigue.  Progressively getting worse.   Patient continues to deny any new lumps or bumps.  Appetite is good.  Noticed 10 pounds weight loss.  No fever no chills.  Review of Systems  Constitutional:  Negative for chills, diaphoresis and fever.  HENT:  Positive for hearing loss. Negative for nosebleeds and sore throat.   Eyes:  Negative for double vision.  Respiratory:  Negative for hemoptysis, sputum production and wheezing.   Cardiovascular:  Negative for chest pain, palpitations, orthopnea and leg swelling.  Gastrointestinal:  Negative for abdominal pain, blood in stool, constipation, diarrhea, heartburn, melena, nausea and vomiting.  Genitourinary:  Negative for dysuria, frequency and urgency.  Musculoskeletal:  Negative for back pain and joint pain.  Skin: Negative.  Negative for itching and rash.  Neurological:  Negative for dizziness, tingling, focal weakness, weakness and headaches.  Endo/Heme/Allergies:  Does not bruise/bleed easily.  Psychiatric/Behavioral:  Negative for depression. The patient does not have insomnia.      MEDICAL HISTORY:  Past Medical History:  Diagnosis Date   Adopted person    Cancer [facility])    Colon polyps    Pancreatitis  Rectal bleeding 02/24/2014   Sepsis (HCC) 09/18/2019    SURGICAL HISTORY: Past Surgical History:  Procedure Laterality Date   CHOLECYSTECTOMY     COLONOSCOPY   02/28/14   IR IMAGING GUIDED PORT INSERTION  07/18/2019   IR REMOVAL TUN ACCESS W/ PORT W/O FL MOD SED  05/29/2021   LYMPH NODE BIOPSY Left 03/28/2020   Procedure: EXCISION OF SUBMANDIBULAR  LYMPH NODE;  Surgeon: Milissa Hamming, MD;  Location[facility];  Service: ENT;  Laterality: Left;   POLYPECTOMY     colon polyp removed   TONSILLECTOMY      SOCIAL HISTORY: Social History   Socioeconomic History   Marital status: Married    Spouse name: Not on file   Number of children: Not on file   Years of education: Not on file   Highest education level: 12th grade  Occupational History    Comment: Retired from [employer]  Tobacco Use   Smoking status: Every Day    Current packs/day: 1.00    Average packs/day: 1 pack/day for 20.0 years (20.0 ttl pk-yrs)    Types: Cigars, Cigarettes   Smokeless tobacco: Never   Tobacco comments:    smoke 1/2 ppd cigaretes since 73 yo. changed to cigars at age July 2004.   Vaping Use   Vaping status: Never Used  Substance and Sexual Activity   Alcohol use: Yes    Comment: 6-7 drinks weekly   Drug use: No   Sexual activity: Not on file  Other Topics Concern   Not on file  Social History Narrative   Lives at home with wife; in [place]. retd from [employer]. Cigar smoker; drinks liquor. Children - grown up.    Social Drivers of Corporate Investment Banker Strain: Low Risk  (08/22/2023)   Overall Financial Resource Strain (CARDIA)    Difficulty of Paying Living Expenses: Not hard at all  Food Insecurity: No Food Insecurity (08/22/2023)   Hunger Vital Sign    Worried About Running Out of Food in the Last Year: Never true    Ran Out of Food in the Last Year: Never true  Transportation Needs: No Transportation Needs (08/22/2023)   PRAPARE - Administrator, Civil Service (Medical): No    Lack of Transportation (Non-Medical): No  Physical Activity: Insufficiently Active (08/22/2023)   Exercise Vital Sign    Days of Exercise per  Week: 1 day    Minutes of Exercise per Session: 10 min  Stress: No Stress Concern Present (08/22/2023)   [employer] of Occupational Health - Occupational Stress Questionnaire    Feeling of Stress : Only a little  Social Connections: Moderately Isolated (08/22/2023)   Social Connection and Isolation Panel [NHANES]    Frequency of Communication with Friends and Family: Once a week    Frequency of Social Gatherings with Friends and Family: Twice a week    Attends Religious Services: Never    Database Administrator or Organizations: No    Attends Engineer, Structural: Not on file    Marital Status: Married  Catering Manager Violence: Not on file    FAMILY HISTORY: Family History  Adopted: Yes    ALLERGIES:  is allergic to allopurinol , azithromycin , and losartan  potassium.  MEDICATIONS:  Current Outpatient Medications  Medication Sig Dispense Refill   Multiple Vitamin (MULTI-VITAMIN) tablet Take 1 tablet by mouth daily.     No current facility-administered medications for this visit.      SABRA  PHYSICAL  EXAMINATION: ECOG PERFORMANCE STATUS: 1 - Symptomatic but completely ambulatory  Vitals:   11/14/23 1007  BP: 111/76  Pulse: 98  Resp: 18  Temp: 98.8 F (37.1 C)  SpO2: 100%   Filed Weights   11/14/23 1007  Weight: 293 lb (132.9 kg)   Positive for bilateral axillary adenopathy.   Physical Exam HENT:     Head: Normocephalic and atraumatic.     Mouth/Throat:     Pharynx: No oropharyngeal exudate.  Eyes:     Pupils: Pupils are equal, round, and reactive to light.  Cardiovascular:     Rate and Rhythm: Normal rate and regular rhythm.  Pulmonary:     Effort: Pulmonary effort is normal. No respiratory distress.     Breath sounds: Normal breath sounds. No wheezing.  Abdominal:     General: Bowel sounds are normal. There is no distension.     Palpations: Abdomen is soft. There is no mass.     Tenderness: There is no abdominal tenderness. There is no  guarding or rebound.  Musculoskeletal:        General: No tenderness. Normal range of motion.     Cervical back: Normal range of motion and neck supple.  Skin:    General: Skin is warm.  Neurological:     Mental Status: He is alert and oriented to person, place, and time.  Psychiatric:        Mood and Affect: Affect normal.    LABORATORY DATA:  I have reviewed the data as listed Lab Results  Component Value Date   WBC 5.1 11/14/2023   HGB 8.5 (L) 11/14/2023   HCT 27.3 (L) 11/14/2023   MCV 100.7 (H) 11/14/2023   PLT 98 (L) 11/14/2023   Recent Labs    06/15/23 0955 10/24/23 1254 11/14/23 0944  NA 140 137 139  K 4.1 4.3 4.4  CL 105 102 101  CO2 27 27 29   GLUCOSE 106* 99 103*  BUN 21 22 24*  CREATININE 0.98 1.04 1.06  CALCIUM  8.9 8.9 9.0  GFRNONAA >60 >60 >60  PROT 6.1* 5.8* 5.5*  ALBUMIN 4.1 3.9 3.6  AST 23 31 34  ALT 25 14 17   ALKPHOS 87 74 76  BILITOT 1.2 2.1* 1.9*    RADIOGRAPHIC STUDIES: I have personally reviewed the radiological images as listed and agreed with the findings in the report. NM PET Image Restage (PS) Skull Base to Thigh (F-18 FDG) Result Date: 11/03/2023 CLINICAL DATA:  Subsequent treatment strategy for lymphoma. EXAM: NUCLEAR MEDICINE PET SKULL BASE TO THIGH TECHNIQUE: 12.84 mCi F-18 FDG was injected intravenously. Full-ring PET imaging was performed from the skull base to thigh after the radiotracer. CT data was obtained and used for attenuation correction and anatomic localization. Fasting blood glucose: 87 mg/dl COMPARISON:  CT chest, abdomen and pelvis from 10/17/2023 and PET-CT from 06/18/2020 FINDINGS: Mediastinal blood pool activity: SUV max 3.20 Liver activity: SUV max 4.28 NECK: Extensive bilateral tracer avid cervical adenopathy is identified. Tracer avid lymph nodes are markedly increased in multiplicity when compared with the previous exam. For example: -new right level 2 lymph node measures 1.6 cm with SUV max of 17.66, image 23/6. -new  right level 4 lymph node measures 1.6 cm with SUV max of 23.93, image 29/6. Marked increased radiotracer uptake within the posterior nasopharynx has an SUV max of 21.85 with corresponding increased soft tissue thickening, image 10/6. Bilateral increased uptake identified within the tonsils and base of tongue are identified. Also new from previous  exam. On the left this has an SUV max of 19.53. On the right SUV max is equal to 18.17, image 25 of the fused PET-CT images. Incidental CT findings: None. CHEST: Bilateral supraclavicular, bilateral mediastinal, bilateral hilar and bilateral axillary lymph nodes are identified. These are all new when compared with the previous PET-CT from 06/18/2020. For example: -right paratracheal lymph node has an SUV max of measures 1.1 cm with SUV max of 26.8. -subcarinal lymph node measures 1.7 cm with SUV max of 29.24, image 54/6. -Right internal mammary lymph node measures 1.2 cm with SUV max of 10.69, image 57/6. -left axillary lymph node measures 1.6 cm with SUV max of 26.26, image 58/6. Small tiny lung nodules are identified which exhibit mild increased tracer uptake. -Index nodule within the posterior right lower lobe measures 5 mm with SUV max of 1.82. Technically too small to characterize by PET-CT, image 66/6. New compared with the previous PET-CT. Incidental CT findings: Aortic atherosclerosis and coronary artery calcifications. ABDOMEN/PELVIS: Marked splenomegaly. The spleen measures 21 cm in cranial caudal dimension. Numerous tracer avid lesions are scattered throughout the spleen, which are too numerous to count. These are suboptimally visualized on the CT images and therefore difficult to measure anatomically. Index lesion within the posterior spleen has an SUV max of 14.95 and measures approximately 2 cm, image 76 of the fused PET-CT images. Extensive abdominopelvic tracer avid lymph nodes. Index nodes include: -Porto caval node measures 1.4 cm with SUV max of 21.43,  image 88/6. -Index left mesenteric lymph node measures 1.6 cm with SUV max of 19.5, image 109/6. -Index left periaortic lymph node measures 1 cm with SUV max of 17.57, image 100/6. -Index left external iliac node measures 1.2 cm with SUV max of 10.18, image 130/6. -Right external iliac node measures 1.2 cm with SUV max of 14.3. Incidental CT findings: Multiple stones identified within the lower pole of the left kidney measuring up to 1.4 cm. 2 simple appearing left kidney cysts are noted. The largest measures 4.3 cm, image 99/6. No follow-up imaging recommended. Previous cholecystectomy. Aortic atherosclerotic calcifications. Sigmoid diverticulosis without signs of acute diverticulitis. SKELETON: Mild diffuse increased uptake identified throughout the bone marrow without focality. Incidental CT findings: None. IMPRESSION: 1. Extensive tracer avid adenopathy identified within the neck, chest, abdomen and pelvis. Findings are compatible with recurrent lymphoma. Deauville criteria 5 2. Marked splenomegaly with numerous tracer avid lesions scattered throughout the spleen. Too numerous to count. Findings are compatible with lymphoma. Deauville criteria 5 3. Marked increased radiotracer uptake within the posterior nasopharynx with corresponding increased soft tissue thickening. Bilateral increased uptake identified within the tonsils and base of tongue. Findings are compatible with lymphoma. Deauville criteria 4 and 5 4. Small tiny lung nodules are identified which exhibit mild increased tracer uptake. Technically too small to characterize by PET-CT. New compared with the previous exam. Findings are concerning for lymphoma. 5. Mild diffuse increased uptake identified throughout the bone marrow without focality. Findings are nonspecific and may be related to anemia or treatment related changes. 6. Left kidney stones. 7.  Aortic Atherosclerosis (ICD10-I70.0). Electronically Signed   By: Waddell Calk M.D.   On: 11/03/2023  16:02   CT SOFT TISSUE NECK W CONTRAST Result Date: 11/03/2023 CLINICAL DATA:  73 year old male with large B-cell.  Restaging. EXAM: CT NECK WITH CONTRAST TECHNIQUE: Multidetector CT imaging of the neck was performed using the standard protocol following the bolus administration of intravenous contrast. RADIATION DOSE REDUCTION: This exam was performed according to the  departmental dose-optimization program which includes automated exposure control, adjustment of the mA and/or kV according to patient size and/or use of iterative reconstruction technique. CONTRAST:  OMNIPAQUE  IOHEXOL  350 MG/ML SOLN COMPARISON:  Neck CT 06/29/2019.  PET-CT 06/18/2020. And CT Chest, Abdomen, and Pelvis this same day as this exam, reported separately. FINDINGS: Pharynx and larynx: Glottis is closed. Regression of bulky hypopharyngeal mass demonstrated in 2020. Laryngeal and pharyngeal soft tissue contours today are within normal limits. Salivary glands: Negative sublingual space. Submandibular and parotid glands are within normal limits. Thyroid: Negative. Lymph nodes: Regression of dominant left level 1, level 2, and level 3 lymph nodes which were abnormal on 2021 PET-CT. Individual nodes at those stations now measure up to 8 mm short axis. However, contralateral right level 1 through level 4 lymph nodes have progressed since 2021 and are individually up to 18 mm short axis (series 4, image 78). And overall there are numerous rounded lymph nodes in the bilateral neck. Vascular: Suboptimal intravascular contrast timing. Grossly patent major vascular structures in the bilateral neck and at the skull base. Carotid atherosclerosis. Limited intracranial: Negative. Visualized orbits: Negative. Mastoids and visualized paranasal sinuses: Sinuses are generally well aerated. Left maxillary alveolar recess retention cysts. Visible tympanic cavities and mastoids are clear. Skeleton: Advanced cervical spine degeneration. Degenerative right  side C3-C4 facet ankylosis since 2020. Bulky disc and endplate degeneration elsewhere. No acute or suspicious osseous lesion identified. Upper chest: CT Chest, Abdomen, and Pelvis the same day are reported separately. IMPRESSION: 1. Cervical lymphadenopathy, progressed in the right neck since a 2021 PET-CT, and compatible with active Lymphoma. 2. CT Chest, Abdomen, and Pelvis the same day are reported separately. Electronically Signed   By: VEAR Hurst M.D.   On: 11/03/2023 07:19   CT CHEST ABDOMEN PELVIS W CONTRAST Result Date: 10/17/2023 CLINICAL DATA:  History of diffuse large B-cell lymphoma, follow-up. * Tracking Code: BO * EXAM: CT CHEST, ABDOMEN, AND PELVIS WITH CONTRAST TECHNIQUE: Multidetector CT imaging of the chest, abdomen and pelvis was performed following the standard protocol during bolus administration of intravenous contrast. RADIATION DOSE REDUCTION: This exam was performed according to the departmental dose-optimization program which includes automated exposure control, adjustment of the mA and/or kV according to patient size and/or use of iterative reconstruction technique. CONTRAST:  OMNIPAQUE  IOHEXOL  350 MG/ML SOLN COMPARISON:  Multiple priors including most recent PET-CT June 18 2020. FINDINGS: Please refer to same day neck CT for findings above the thoracic inlet. CT CHEST FINDINGS Cardiovascular: Normal caliber thoracic aorta. Aortic atherosclerosis. No central pulmonary embolus on this nondedicated study. Borderline cardiac enlargement. Three-vessel coronary artery calcifications Mediastinum/Nodes: Slight decreased size of bilateral axillary lymph nodes with increased size of prominent/mildly enlarged mediastinal lymph nodes and hilar nodal tissue. For reference: -high right paratracheal lymph node measures 12 mm on image 40/3 previously 4 mm. -subcarinal lymph node measures 17 mm in short axis on image 62/3 previously 3 mm. -right axillary lymph node measures 7 mm in short axis on  image 40/3 previously 11 mm Lungs/Pleura: No suspicious pulmonary nodules or masses. Scattered atelectasis/scarring. Musculoskeletal: No aggressive lytic or blastic lesion of bone multilevel degenerative change of the spine. CT ABDOMEN PELVIS FINDINGS Hepatobiliary: Hepatic morphologic changes compatible with cirrhosis. Calcified subcapsular hepatic lesions. Gallbladder surgically absent. No biliary ductal dilation. Pancreas: No pancreatic ductal dilation or evidence of acute inflammation. Spleen: Splenomegaly measuring 22.7 cm in maximum axial dimension. Adrenals/Urinary Tract: Bilateral adrenal glands appear normal. 19 mm left lower pole renal stone with  a associated caliectasis. Additional nonobstructive left renal stones measure up to 7 mm. Punctate nonobstructive right renal stones. Left upper pole renal cyst measures fluid density is considered benign requiring no independent imaging follow-up. Urinary bladder is unremarkable for degree of distension Stomach/Bowel: Radiopaque enteric contrast material traverses distal loops of small bowel. Stomach is unremarkable for degree of distension. No pathologic dilation of large or small bowel colonic diverticulosis without findings of acute diverticulitis. Vascular/Lymphatic: Aortic atherosclerosis. Smooth IVC contours. The portal, splenic and superior mesenteric veins are patent. Portosystemic collateral vessels. Prominent periportal, gastrohepatic, hepatoduodenal ligament lymph nodes are progressive from prior. For reference: -a 15 mm short axis periportal lymph node on image 151/3 Increased size of mesenteric lymph nodes for instance a lymph node in the jejunal mesentery measuring 17 mm in short axis on image 199/3. Prominent/mildly enlarged scattered retroperitoneal lymph nodes for instance a 9 mm short axis aortocaval lymph node on image 165/3 and a 11 mm short axis left external iliac lymph node on image 282/3 Reproductive: Normal size prostate gland. Other: No  significant abdominopelvic free fluid. Musculoskeletal: Multilevel degenerative change of the spine. IMPRESSION: 1. Increased adenopathy above and below the diaphragm, concerning for progressive lymphoma. 2. Splenomegaly may reflects sequela of portal venous hypertension or lymphomatous disease involvement. 3. Hepatic morphologic changes compatible with cirrhosis with sequela of portal hypertension. 4. 19 mm left lower pole renal stone with associated caliectasis. Additional nonobstructive left renal stones measure up to 7 mm. Punctate nonobstructive right renal stones. 5. Colonic diverticulosis without findings of acute diverticulitis. 6. Aortic Atherosclerosis (ICD10-I70.0). Electronically Signed   By: [name] Holder M.D.   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  DEC 30th, 2024- Extensive tracer avid adenopathy identified within the neck, chest, abdomen and pelvis. Findings are compatible with recurrent lymphoma. Marked splenomegaly with numerous tracer avid lesions scattered throughout the spleen. Too numerous to count.  Marked increased radiotracer uptake within the posterior nasopharynx with corresponding increased soft tissue thickening. Bilateral increased uptake identified within the tonsils and base of tongue.  Small tiny lung nodules are identified which exhibit mild increased tracer uptake. Mild diffuse increased uptake identified throughout the bone marrow without focality.   # Worsening anemia-hemoglobin 8.5 platelets-98 -again concerning for lymphoma involvement.  Will monitor closely.  HOLD off bone marrow biopsy.  # Borderline LOW EF of 45-50%; Sep Echo 50-55% [Dr.Gollan]- on coreg  BID -stable; discussed with Dr. Sloan repeat 2D echo; repeat cardiac evaluation.  # Infectious Disease/Prophylaxis: Currently OFF valtrex /Bactrim  DS. Check CD  4 counts.   # Cirrhosis- based Imaging [No decompensation]-no prior history of hepatitis B or C.  # Elevated uric acid-10- [ Hx of rash to allopurinol ]  # PIV- s/p port explanation  # DISPOSITION:  # follow up 1 week- MD; labs- cbc/cmp/ldh; uric acid; haptoglobin-hold tube- Dr.B  # I reviewed the blood work- with the patient in detail; also reviewed the imaging independently [as summarized above]; and with the patient in detail.     All questions were answered. The patient knows to call the clinic with any problems, questions or concerns.    Cindy JONELLE Joe, MD 11/14/2023 2:29 PM

## 2023-11-14 NOTE — Progress Notes (Signed)
 PET 10/31/23.

## 2023-11-16 ENCOUNTER — Other Ambulatory Visit: Payer: Medicare Other

## 2023-11-16 ENCOUNTER — Ambulatory Visit: Payer: Medicare Other | Admitting: Internal Medicine

## 2023-11-17 ENCOUNTER — Ambulatory Visit
Admission: RE | Admit: 2023-11-17 | Discharge: 2023-11-17 | Disposition: A | Discharge status: Home / Self Care | Payer: Medicare Other | Source: Ambulatory Visit | Attending: Nurse Practitioner | Admitting: Nurse Practitioner

## 2023-11-17 VITALS — BP 104/43 | HR 91

## 2023-11-17 DIAGNOSIS — R599 Enlarged lymph nodes, unspecified: Secondary | ICD-10-CM | POA: Diagnosis not present

## 2023-11-17 DIAGNOSIS — R590 Localized enlarged lymph nodes: Secondary | ICD-10-CM | POA: Insufficient documentation

## 2023-11-17 DIAGNOSIS — R531 Weakness: Secondary | ICD-10-CM | POA: Diagnosis not present

## 2023-11-17 DIAGNOSIS — C8330 Diffuse large B-cell lymphoma, unspecified site: Secondary | ICD-10-CM | POA: Diagnosis not present

## 2023-11-17 DIAGNOSIS — Z8572 Personal history of non-Hodgkin lymphomas: Secondary | ICD-10-CM | POA: Insufficient documentation

## 2023-11-17 DIAGNOSIS — C8331 Diffuse large B-cell lymphoma, lymph nodes of head, face, and neck: Secondary | ICD-10-CM | POA: Diagnosis not present

## 2023-11-17 DIAGNOSIS — C8590 Non-Hodgkin lymphoma, unspecified, unspecified site: Secondary | ICD-10-CM

## 2023-11-17 NOTE — Procedures (Signed)
Pre Procedure Dx: Right supraclavicular lymphadenopathy Post Procedural Dx: Same  Technically successful US guided biopsy of hypermetabolic right supraclavicular lymph node.   EBL: Trace No immediate complications.   Katherina Right, MD Pager #: [phone]

## 2023-11-18 LAB — SURGICAL PATHOLOGY

## 2023-11-21 ENCOUNTER — Inpatient Hospital Stay: Payer: Medicare Other | Admitting: Internal Medicine

## 2023-11-21 ENCOUNTER — Inpatient Hospital Stay: Payer: Medicare Other

## 2023-11-21 ENCOUNTER — Encounter: Payer: Self-pay | Admitting: Internal Medicine

## 2023-11-21 VITALS — BP 114/58 | HR 97 | Temp 98.2°F | Ht 72.0 in | Wt 296.8 lb

## 2023-11-21 DIAGNOSIS — C8590 Non-Hodgkin lymphoma, unspecified, unspecified site: Secondary | ICD-10-CM

## 2023-11-21 DIAGNOSIS — Z7901 Long term (current) use of anticoagulants: Secondary | ICD-10-CM | POA: Diagnosis not present

## 2023-11-21 DIAGNOSIS — Z5112 Encounter for antineoplastic immunotherapy: Secondary | ICD-10-CM | POA: Diagnosis not present

## 2023-11-21 DIAGNOSIS — D649 Anemia, unspecified: Secondary | ICD-10-CM | POA: Diagnosis not present

## 2023-11-21 DIAGNOSIS — K7460 Unspecified cirrhosis of liver: Secondary | ICD-10-CM | POA: Diagnosis not present

## 2023-11-21 DIAGNOSIS — R918 Other nonspecific abnormal finding of lung field: Secondary | ICD-10-CM | POA: Diagnosis not present

## 2023-11-21 DIAGNOSIS — C83398 Diffuse large b-cell lymphoma of other extranodal and solid organ sites: Secondary | ICD-10-CM | POA: Diagnosis not present

## 2023-11-21 DIAGNOSIS — Z86711 Personal history of pulmonary embolism: Secondary | ICD-10-CM | POA: Diagnosis not present

## 2023-11-21 LAB — CMP (CANCER CENTER ONLY)
ALT: 18 U/L (ref 0–44)
AST: 33 U/L (ref 15–41)
Albumin: 3.5 g/dL (ref 3.5–5.0)
Alkaline Phosphatase: 72 U/L (ref 38–126)
Anion gap: 9 (ref 5–15)
BUN: 27 mg/dL — ABNORMAL HIGH (ref 8–23)
CO2: 26 mmol/L (ref 22–32)
Calcium: 9 mg/dL (ref 8.9–10.3)
Chloride: 104 mmol/L (ref 98–111)
Creatinine: 1.3 mg/dL — ABNORMAL HIGH (ref 0.61–1.24)
GFR, Estimated: 59 mL/min — ABNORMAL LOW (ref >60)
Glucose, Bld: 94 mg/dL (ref 70–99)
Potassium: 4.2 mmol/L (ref 3.5–5.1)
Sodium: 139 mmol/L (ref 135–145)
Total Bilirubin: 1.9 mg/dL — ABNORMAL HIGH (ref 0.0–1.2)
Total Protein: 5.5 g/dL — ABNORMAL LOW (ref 6.5–8.1)

## 2023-11-21 LAB — CBC WITH DIFFERENTIAL (CANCER CENTER ONLY)
Abs Immature Granulocytes: 0.05 10*3/uL (ref 0.00–0.07)
Basophils Absolute: 0 10*3/uL (ref 0.0–0.1)
Basophils Relative: 1 %
Eosinophils Absolute: 0.1 10*3/uL (ref 0.0–0.5)
Eosinophils Relative: 3 %
HCT: 25.3 % — ABNORMAL LOW (ref 39.0–52.0)
Hemoglobin: 8 g/dL — ABNORMAL LOW (ref 13.0–17.0)
Immature Granulocytes: 1 %
Lymphocytes Relative: 12 %
Lymphs Abs: 0.5 10*3/uL — ABNORMAL LOW (ref 0.7–4.0)
MCH: 31.6 pg (ref 26.0–34.0)
MCHC: 31.6 g/dL (ref 30.0–36.0)
MCV: 100 fL (ref 80.0–100.0)
Monocytes Absolute: 0.9 10*3/uL (ref 0.1–1.0)
Monocytes Relative: 19 %
Neutro Abs: 3 10*3/uL (ref 1.7–7.7)
Neutrophils Relative %: 64 %
Platelet Count: 95 10*3/uL — ABNORMAL LOW (ref 150–400)
RBC: 2.53 MIL/uL — ABNORMAL LOW (ref 4.22–5.81)
RDW: 21.1 % — ABNORMAL HIGH (ref 11.5–15.5)
WBC Count: 4.6 10*3/uL (ref 4.0–10.5)
nRBC: 0 % (ref 0.0–0.2)

## 2023-11-21 LAB — SAMPLE TO BLOOD BANK

## 2023-11-21 LAB — URIC ACID: Uric Acid, Serum: 11 mg/dL — ABNORMAL HIGH (ref 3.7–8.6)

## 2023-11-21 LAB — LACTATE DEHYDROGENASE: LDH: 441 U/L — ABNORMAL HIGH (ref 98–192)

## 2023-11-21 MED ORDER — FEBUXOSTAT 40 MG PO TABS: ORAL_TABLET | ORAL | 1 refills | Status: DC

## 2023-11-21 NOTE — Progress Notes (Signed)
Appetite 75% normal, no supplements.  Pt states he had some chills/shaking about a week ago. Not sure why it happened.

## 2023-11-21 NOTE — Assessment & Plan Note (Addendum)
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  DEC 30th, 2024- Extensive tracer avid adenopathy identified within the neck, chest, abdomen and pelvis. Findings are compatible with recurrent lymphoma. Marked splenomegaly with numerous tracer avid lesions scattered throughout the spleen. Too numerous to count.  Marked increased radiotracer uptake within the posterior nasopharynx with corresponding increased soft tissue thickening. Bilateral increased uptake identified within the tonsils and base of tongue.  Small tiny lung nodules are identified which exhibit mild increased tracer uptake. Mild diffuse increased uptake identified throughout the bone marrow without focality.   # Worsening anemia-hemoglobin 8.0  platelets-98 -again concerning for lymphoma involvement.  Will monitor closely.  HOLD off bone marrow biopsy.   # Neck lymph node biopsy-November 17, 2023-12 cytometry primary recurrent lymphoma; final pathology is pending.  Discussed with the patient-the options include repeat CAR-T as part of clinical trial; bispecific antibodies therapies [first month/cycle has begun at [facility]]; third option being lonca.  Discussed with Dr. Noralee Stain at [facility].  Also emailed Dr. Noralee Stain regarding follow-up appointment to discuss the treatment options.  # Borderline LOW EF of 45-50%; Sep Echo 50-55% [Dr.Gollan]- on coreg BID -stable; discussed with Dr. Larene Pickett repeat 2D echo; repeat cardiac evaluation on 1/29- pending.   # Infectious Disease/Prophylaxis: Currently OFF valtrex/Bactrim DS. Check CD 4 counts.   # Cirrhosis- based Imaging [No decompensation]-no prior history of hepatitis B or C.  # High risk of tumor lysis/  elevated uric acid-10- [ Hx of rash to allopurinol]- Febuxostat start at 40 mg a day x1 week, and then increase to 80 mg a day.   # PIV- s/p port explanation  # DISPOSITION:  # follow up in 1 week/Tuesday- MD; labs- cbc/cmp/ldh; uric  acid;  hold tube; D-2 possible1 unit PRBC- Dr.B

## 2023-11-21 NOTE — Progress Notes (Signed)
[facility] CONSULT NOTE  Patient Care Team: Malva Limes, MD as PCP - General (Family Medicine) Lemar Livings, Merrily Pew, MD (General Surgery) Earna Coder, MD as Consulting Physician (Internal Medicine)  CHIEF COMPLAINTS/PURPOSE OF CONSULTATION: Diffuse large B-cell lymphoma  #  Oncology History Overview Note  #June 29, 2019-CT- bulky bilateral neck adenopathy left more than right; September 2020-Diffuse B-cell lymphoma; ABC subtype; NEGATIVE FISH studies.  PET scan -bulky adenopathy involving neck /lymphadenopathy mediastinum /abdomen/pelvis inguinal lymph nodes; splenic involvement.  Question bone.  Stage IV [spleen involvement; no bone marrow biopsy].  BCL-2: Positive /BCL 6: Positive /MUM-1: Positive /CD10: Negative 'Ki-67: High proliferation index (90%) /CD30: Negative /Cyclin D1: Negative C-MYC: Equivocal (20% staining seen on limited and fragmented tissue) /EBER: Negative   # Ritux-Pred- 9/11; 9/18- R-COP [held adria- sec to EF 45-50%]  #May 2021-PET scan left neck recurrent disease; excisional biopsy [Dr. Vaught]-recurrent diffuse left B-cell lymphoma  #November 2020-bilateral PE; on xarelto  #J07/07/2020- 2021- Gem-CarboDex [II opinion [facility]; Dr. Noralee Stain; stem cell transplant eval]; no Rituxan-repeat CD20 negative.   #S/p 2 cycles-gem carbo Dex-June 18, 2020 PET scan-slight increase in SUV uptake of the left cervical lymph nodes; no distant metastatic disease.  Discontinued chemotherapy; refer to CAR-T;  OCT 12th 2021- [facility]- -New extensive cervical, supraclavicular, intrathoracic, axillary and abdominal and pelvic lymph nodes, worrisome for lymphoma involvement. New splenomegaly with new, diffuse uptake, now more intense than the liver, concerning for lymphoma involvement. S/p POLA-BENDA  # CAR-T/Axi-cel on 09/16/20 preceded by fludarabine/cyclophosphamide lymphodepletion.[[facility]; Dr.Grover]; complicated by CRS-G-3 [ICU]; dec 27th-PET CR.  # JAN 2025- CT/PET  scan-widespread lymphadenopathy; splenic enlargement concerning for recurrent lymphoma  # JAN 16th, 2025- LN Bx-   # Sep 2020- Borderline low EF/Left ventricular hypokinesis [Dr.Gollan]-s October 9th stress test-nonischemic;   # DIAGNOSIS: DLBCL     Lymphoma of lymph nodes (HCC)  07/03/2019 Initial Diagnosis   Lymphoma of lymph nodes (HCC)   07/13/2019 - 12/07/2019 Chemotherapy   The patient had palonosetron (ALOXI) injection 0.25 mg, 0.25 mg, Intravenous,  Once, 7 of 7 cycles Administration: 0.25 mg (07/20/2019), 0.25 mg (08/13/2019), 0.25 mg (11/13/2019), 0.25 mg (09/03/2019), 0.25 mg (10/02/2019), 0.25 mg (10/23/2019), 0.25 mg (12/04/2019) pegfilgrastim (NEULASTA ONPRO KIT) injection 6 mg, 6 mg, Subcutaneous, Once, 2 of 2 cycles Administration: 6 mg (10/04/2019), 6 mg (10/25/2019) pegfilgrastim-cbqv (UDENYCA) injection 6 mg, 6 mg, Subcutaneous, Once, 5 of 5 cycles Administration: 6 mg (07/23/2019), 6 mg (08/16/2019), 6 mg (09/06/2019), 6 mg (11/16/2019), 6 mg (12/07/2019) vinCRIStine (ONCOVIN) 2 mg in sodium chloride 0.9 % 50 mL chemo infusion, 2 mg, Intravenous,  Once, 7 of 7 cycles Administration: 2 mg (07/20/2019), 2 mg (08/13/2019), 2 mg (11/13/2019), 2 mg (09/03/2019), 2 mg (10/02/2019), 2 mg (10/23/2019), 2 mg (12/04/2019) cyclophosphamide (CYTOXAN) 1,900 mg in sodium chloride 0.9 % 250 mL chemo infusion, 750 mg/m2 = 1,900 mg, Intravenous,  Once, 7 of 7 cycles Administration: 1,900 mg (07/20/2019), 1,900 mg (08/13/2019), 2,000 mg (11/13/2019), 1,900 mg (09/03/2019), 2,000 mg (10/02/2019), 2,000 mg (10/23/2019), 2,000 mg (12/04/2019) etoposide (VEPESID) 250 mg in sodium chloride 0.9 % 1,000 mL chemo infusion, 100 mg/m2 = 250 mg, Intravenous,  Once, 6 of 6 cycles Dose modification: 50 mg/m2 (original dose 100 mg/m2, Cycle 3, Reason: Provider Judgment) Administration: 130 mg (08/14/2019), 130 mg (08/15/2019), 130 mg (09/03/2019), 130 mg (09/04/2019), 130 mg (09/05/2019), 130 mg (10/03/2019), 130 mg (10/04/2019), 130  mg (10/24/2019), 130 mg (10/25/2019), 130 mg (11/13/2019), 130 mg (11/14/2019), 130 mg (11/15/2019), 130 mg (10/02/2019), 130  mg (10/23/2019), 130 mg (12/04/2019), 130 mg (12/05/2019), 130 mg (12/06/2019) dexamethasone (DECADRON) 12 mg in sodium chloride 0.9 % 145 mL IVPB, , Intravenous,  Once, 8 of 8 cycles Administration:  (07/13/2019),  (07/20/2019),  (08/13/2019),  (11/13/2019),  (09/03/2019),  (10/02/2019),  (10/23/2019),  (12/04/2019) riTUXimab-pvvr (RUXIENCE) 900 mg in sodium chloride 0.9 % 250 mL (2.6471 mg/mL) infusion, 375 mg/m2 = 900 mg (100 % of original dose 375 mg/m2), Intravenous,  Once, 1 of 1 cycle Dose modification: 375 mg/m2 (original dose 375 mg/m2, Cycle 1) Administration: 900 mg (07/13/2019)  for chemotherapy treatment.    05/09/2020 - 06/06/2020 Chemotherapy   The patient had palonosetron (ALOXI) injection 0.25 mg, 0.25 mg, Intravenous,  Once, 2 of 4 cycles Administration: 0.25 mg (05/09/2020), 0.25 mg (05/30/2020) CARBOplatin (PARAPLATIN) 750 mg in sodium chloride 0.9 % 250 mL chemo infusion, 750 mg (100 % of original dose 750 mg), Intravenous,  Once, 2 of 4 cycles Dose modification:   (original dose 750 mg, Cycle 1, Reason: Provider Judgment, Comment: labs from care every where. ) Administration: 750 mg (05/09/2020), 750 mg (05/30/2020) gemcitabine (GEMZAR) 2,500 mg in sodium chloride 0.9 % 250 mL chemo infusion, 2,584 mg, Intravenous,  Once, 2 of 4 cycles Administration: 2,500 mg (05/09/2020), 2,500 mg (05/16/2020), 2,500 mg (05/30/2020), 2,500 mg (06/06/2020) fosaprepitant (EMEND) 150 mg in sodium chloride 0.9 % 145 mL IVPB, 150 mg, Intravenous,  Once, 2 of 4 cycles Administration: 150 mg (05/09/2020), 150 mg (05/30/2020)  for chemotherapy treatment.    08/27/2020 - 08/27/2020 Chemotherapy   Patient is on Treatment Plan : NON-HODGKINS LYMPHOMA RELAPSED/ REFRACTORY DLBCL Polatuzumab + Bendamustine + Rituximab q21d x 6 cycles     11/13/2020 Cancer Staging   Staging form: Hodgkin and Non-Hodgkin Lymphoma,  AJCC 8th Edition - Clinical: Stage IV (Diffuse large B-cell lymphoma) - Signed by Earna Coder, MD on 11/13/2020     HISTORY OF PRESENTING ILLNESS: Ambulating independently.  Accompanied by his wife.  Eric [name] 73 y.o.  male relapsed/refractory diffuse large B-cell lymphoma currently s/p CART therapy at [facility] in November 2021 is here for follow-up-in review results of the PET scan; and the review the results of the LN biopsy..   Pt having LN biopsy on 11/16/22. Does have stress regarding all of his scan results. Denies night sweats. Has occ warm sensation involving his ears. Recovering from a sinus infection. Denies any pain.  Patient complains of worsening fatigue.  Progressively getting worse.     Noticed 10 pounds weight loss.  No fever no chills.  Review of Systems  Constitutional:  Negative for chills, diaphoresis and fever.  HENT:  Positive for hearing loss. Negative for nosebleeds and sore throat.   Eyes:  Negative for double vision.  Respiratory:  Negative for hemoptysis, sputum production and wheezing.   Cardiovascular:  Negative for chest pain, palpitations, orthopnea and leg swelling.  Gastrointestinal:  Negative for abdominal pain, blood in stool, constipation, diarrhea, heartburn, melena, nausea and vomiting.  Genitourinary:  Negative for dysuria, frequency and urgency.  Musculoskeletal:  Negative for back pain and joint pain.  Skin: Negative.  Negative for itching and rash.  Neurological:  Negative for dizziness, tingling, focal weakness, weakness and headaches.  Endo/Heme/Allergies:  Does not bruise/bleed easily.  Psychiatric/Behavioral:  Negative for depression. The patient does not have insomnia.      MEDICAL HISTORY:  Past Medical History:  Diagnosis Date   Adopted person    [facility])    Colon polyps    Pancreatitis  Rectal bleeding 02/24/2014   Sepsis (HCC) 09/18/2019    SURGICAL HISTORY: Past Surgical History:  Procedure Laterality Date    CHOLECYSTECTOMY     COLONOSCOPY  02/28/14   IR IMAGING GUIDED PORT INSERTION  07/18/2019   IR REMOVAL TUN ACCESS W/ PORT W/O FL MOD SED  05/29/2021   LYMPH NODE BIOPSY Left 03/28/2020   Procedure: EXCISION OF SUBMANDIBULAR  LYMPH NODE;  Surgeon: Bud Face, MD;  [facility];  Service: ENT;  Laterality: Left;   POLYPECTOMY     colon polyp removed   TONSILLECTOMY      SOCIAL HISTORY: Social History   Socioeconomic History   Marital status: Married    Spouse name: Not on file   Number of children: Not on file   Years of education: Not on file   Highest education level: 12th grade  Occupational History    Comment: Retired from [employer]  Tobacco Use   Smoking status: Every Day    Current packs/day: 1.00    Average packs/day: 1 pack/day for 20.0 years (20.0 ttl pk-yrs)    Types: Cigars, Cigarettes   Smokeless tobacco: Never   Tobacco comments:    smoke 1/2 ppd cigaretes since 73 yo. changed to cigars at age July 2004.   Vaping Use   Vaping status: Never Used  Substance and Sexual Activity   Alcohol use: Yes    Comment: 6-7 drinks weekly   Drug use: No   Sexual activity: Not on file  Other Topics Concern   Not on file  Social History Narrative   Lives at home with wife; in [place]. retd from [employer]. Cigar smoker; drinks liquor. Children - grown up.    Social Drivers of Corporate investment banker Strain: Low Risk  (08/22/2023)   Overall Financial Resource Strain (CARDIA)    Difficulty of Paying Living Expenses: Not hard at all  Food Insecurity: No Food Insecurity (08/22/2023)   Hunger Vital Sign    Worried About Running Out of Food in the Last Year: Never true    Ran Out of Food in the Last Year: Never true  Transportation Needs: No Transportation Needs (08/22/2023)   PRAPARE - Administrator, Civil Service (Medical): No    Lack of Transportation (Non-Medical): No  Physical Activity: Insufficiently Active (08/22/2023)   Exercise  Vital Sign    Days of Exercise per Week: 1 day    Minutes of Exercise per Session: 10 min  Stress: No Stress Concern Present (08/22/2023)   [employer] of Occupational Health - Occupational Stress Questionnaire    Feeling of Stress : Only a little  Social Connections: Moderately Isolated (08/22/2023)   Social Connection and Isolation Panel [NHANES]    Frequency of Communication with Friends and Family: Once a week    Frequency of Social Gatherings with Friends and Family: Twice a week    Attends Religious Services: Never    Database administrator or Organizations: No    Attends Engineer, structural: Not on file    Marital Status: Married  Catering manager Violence: Not on file    FAMILY HISTORY: Family History  Adopted: Yes    ALLERGIES:  is allergic to allopurinol, azithromycin, and losartan potassium.  MEDICATIONS:  Current Outpatient Medications  Medication Sig Dispense Refill   febuxostat (ULORIC) 40 MG tablet Take 40 mg a day x1 week, and then increase to 80 mg a day. 60 tablet 1   Multiple Vitamin (MULTI-VITAMIN) tablet  Take 1 tablet by mouth daily.     No current facility-administered medications for this visit.      Marland Kitchen  PHYSICAL EXAMINATION: ECOG PERFORMANCE STATUS: 1 - Symptomatic but completely ambulatory  Vitals:   11/21/23 0911  BP: (!) 114/58  Pulse: 97  Temp: 98.2 F (36.8 C)  SpO2: 99%    Filed Weights   11/21/23 0911  Weight: 296 lb 12.8 oz (134.6 kg)    Positive for bilateral axillary adenopathy.   Physical Exam HENT:     Head: Normocephalic and atraumatic.     Mouth/Throat:     Pharynx: No oropharyngeal exudate.  Eyes:     Pupils: Pupils are equal, round, and reactive to light.  Cardiovascular:     Rate and Rhythm: Normal rate and regular rhythm.  Pulmonary:     Effort: Pulmonary effort is normal. No respiratory distress.     Breath sounds: Normal breath sounds. No wheezing.  Abdominal:     General: Bowel sounds are  normal. There is no distension.     Palpations: Abdomen is soft. There is no mass.     Tenderness: There is no abdominal tenderness. There is no guarding or rebound.  Musculoskeletal:        General: No tenderness. Normal range of motion.     Cervical back: Normal range of motion and neck supple.  Skin:    General: Skin is warm.  Neurological:     Mental Status: He is alert and oriented to person, place, and time.  Psychiatric:        Mood and Affect: Affect normal.    LABORATORY DATA:  I have reviewed the data as listed Lab Results  Component Value Date   WBC 4.6 11/21/2023   HGB 8.0 (L) 11/21/2023   HCT 25.3 (L) 11/21/2023   MCV 100.0 11/21/2023   PLT 95 (L) 11/21/2023   Recent Labs    10/24/23 1254 11/14/23 0944 11/21/23 0856  NA 137 139 139  K 4.3 4.4 4.2  CL 102 101 104  CO2 27 29 26   GLUCOSE 99 103* 94  BUN 22 24* 27*  CREATININE 1.04 1.06 1.30*  CALCIUM 8.9 9.0 9.0  GFRNONAA >60 >60 59*  PROT 5.8* 5.5* 5.5*  ALBUMIN 3.9 3.6 3.5  AST 31 34 33  ALT 14 17 18   ALKPHOS 74 76 72  BILITOT 2.1* 1.9* 1.9*    RADIOGRAPHIC STUDIES: I have personally reviewed the radiological images as listed and agreed with the findings in the report. [place] CORE BIOPSY (LYMPH NODES) Result Date: 11/17/2023 INDICATION: History of lymphoma. Please perform ultrasound-guided right supraclavicular lymph node biopsy for tissue diagnostic purposes. EXAM: ULTRASOUND-GUIDED RIGHT SUPRACLAVICULAR LYMPH NODE BIOPSY COMPARISON:  PET-CT-10/31/2023; neck CT-10/17/2023 MEDICATIONS: None ANESTHESIA/SEDATION: None COMPLICATIONS: None immediate. TECHNIQUE: Informed written consent was obtained from the patient after a discussion of the risks, benefits and alternatives to treatment. Questions regarding the procedure were encouraged and answered. Initial ultrasound scanning demonstrated an enlarged at least 3.4 x 3 point 2 x 1.1 cm malignant appearing right supraclavicular lymph node correlating with the  hypermetabolic supraclavicular lymph node seen on preceding PET-CT image 31, series 607. an ultrasound image was saved for documentation purposes. The procedure was planned. A timeout was performed prior to the initiation of the procedure. The operative was prepped and draped in the usual sterile fashion, and a sterile drape was applied covering the operative field. A timeout was performed prior to the initiation of the procedure. Local anesthesia was  provided with 1% lidocaine with epinephrine. Under direct ultrasound guidance, an 18 gauge core needle device was utilized to obtain to obtain 6 core needle biopsies of the hypermetabolic right supraclavicular lymph node. The samples were placed in saline and submitted to pathology. The needle was removed and superficial hemostasis was achieved with manual compression. Post procedure scan was negative for significant hematoma. A dressing was applied. The patient tolerated the procedure well without immediate postprocedural complication. IMPRESSION: Technically successful ultrasound guided biopsy of dominant hypermetabolic right supraclavicular lymph node. Electronically Signed   By: Simonne Come M.D.   On: 11/17/2023 16:46   NM PET Image Restage (PS) Skull Base to Thigh (F-18 FDG) Result Date: 11/03/2023 CLINICAL DATA:  Subsequent treatment strategy for lymphoma. EXAM: NUCLEAR MEDICINE PET SKULL BASE TO THIGH TECHNIQUE: 12.84 mCi F-18 FDG was injected intravenously. Full-ring PET imaging was performed from the skull base to thigh after the radiotracer. CT data was obtained and used for attenuation correction and anatomic localization. Fasting blood glucose: 87 mg/dl COMPARISON:  CT chest, abdomen and pelvis from 10/17/2023 and PET-CT from 06/18/2020 FINDINGS: Mediastinal blood pool activity: SUV max 3.20 Liver activity: SUV max 4.28 NECK: Extensive bilateral tracer avid cervical adenopathy is identified. Tracer avid lymph nodes are markedly increased in multiplicity  when compared with the previous exam. For example: -new right level 2 lymph node measures 1.6 cm with SUV max of 17.66, image 23/6. -new right level 4 lymph node measures 1.6 cm with SUV max of 23.93, image 29/6. Marked increased radiotracer uptake within the posterior nasopharynx has an SUV max of 21.85 with corresponding increased soft tissue thickening, image 10/6. Bilateral increased uptake identified within the tonsils and base of tongue are identified. Also new from previous exam. On the left this has an SUV max of 19.53. On the right SUV max is equal to 18.17, image 25 of the fused PET-CT images. Incidental CT findings: None. CHEST: Bilateral supraclavicular, bilateral mediastinal, bilateral hilar and bilateral axillary lymph nodes are identified. These are all new when compared with the previous PET-CT from 06/18/2020. For example: -right paratracheal lymph node has an SUV max of measures 1.1 cm with SUV max of 26.8. -subcarinal lymph node measures 1.7 cm with SUV max of 29.24, image 54/6. -Right internal mammary lymph node measures 1.2 cm with SUV max of 10.69, image 57/6. -left axillary lymph node measures 1.6 cm with SUV max of 26.26, image 58/6. Small tiny lung nodules are identified which exhibit mild increased tracer uptake. -Index nodule within the posterior right lower lobe measures 5 mm with SUV max of 1.82. Technically too small to characterize by PET-CT, image 66/6. New compared with the previous PET-CT. Incidental CT findings: Aortic atherosclerosis and coronary artery calcifications. ABDOMEN/PELVIS: Marked splenomegaly. The spleen measures 21 cm in cranial caudal dimension. Numerous tracer avid lesions are scattered throughout the spleen, which are too numerous to count. These are suboptimally visualized on the CT images and therefore difficult to measure anatomically. Index lesion within the posterior spleen has an SUV max of 14.95 and measures approximately 2 cm, image 76 of the fused PET-CT  images. Extensive abdominopelvic tracer avid lymph nodes. Index nodes include: -Porto caval node measures 1.4 cm with SUV max of 21.43, image 88/6. -Index left mesenteric lymph node measures 1.6 cm with SUV max of 19.5, image 109/6. -Index left periaortic lymph node measures 1 cm with SUV max of 17.57, image 100/6. -Index left external iliac node measures 1.2 cm with SUV max of 10.18,  image 130/6. -Right external iliac node measures 1.2 cm with SUV max of 14.3. Incidental CT findings: Multiple stones identified within the lower pole of the left kidney measuring up to 1.4 cm. 2 simple appearing left kidney cysts are noted. The largest measures 4.3 cm, image 99/6. No follow-up imaging recommended. Previous cholecystectomy. Aortic atherosclerotic calcifications. Sigmoid diverticulosis without signs of acute diverticulitis. SKELETON: Mild diffuse increased uptake identified throughout the bone marrow without focality. Incidental CT findings: None. IMPRESSION: 1. Extensive tracer avid adenopathy identified within the neck, chest, abdomen and pelvis. Findings are compatible with recurrent lymphoma. Deauville criteria 5 2. Marked splenomegaly with numerous tracer avid lesions scattered throughout the spleen. Too numerous to count. Findings are compatible with lymphoma. Deauville criteria 5 3. Marked increased radiotracer uptake within the posterior nasopharynx with corresponding increased soft tissue thickening. Bilateral increased uptake identified within the tonsils and base of tongue. Findings are compatible with lymphoma. Deauville criteria 4 and 5 4. Small tiny lung nodules are identified which exhibit mild increased tracer uptake. Technically too small to characterize by PET-CT. New compared with the previous exam. Findings are concerning for lymphoma. 5. Mild diffuse increased uptake identified throughout the bone marrow without focality. Findings are nonspecific and may be related to anemia or treatment related  changes. 6. Left kidney stones. 7.  Aortic Atherosclerosis (ICD10-I70.0). Electronically Signed   By: Signa Kell M.D.   On: 11/03/2023 16:02    ASSESSMENT & PLAN:   Lymphoma of lymph nodes (HCC) # Chemo refractory/ Relapsed diffuse large B-cell lymphoma-ABC subtype; IV [spleen]; NOV 2021- CART therapy.  DEC 30th, 2024- Extensive tracer avid adenopathy identified within the neck, chest, abdomen and pelvis. Findings are compatible with recurrent lymphoma. Marked splenomegaly with numerous tracer avid lesions scattered throughout the spleen. Too numerous to count.  Marked increased radiotracer uptake within the posterior nasopharynx with corresponding increased soft tissue thickening. Bilateral increased uptake identified within the tonsils and base of tongue.  Small tiny lung nodules are identified which exhibit mild increased tracer uptake. Mild diffuse increased uptake identified throughout the bone marrow without focality.   # Worsening anemia-hemoglobin 8.0  platelets-98 -again concerning for lymphoma involvement.  Will monitor closely.  HOLD off bone marrow biopsy.   # Neck lymph node biopsy-November 17, 2023-12 cytometry primary recurrent lymphoma; final pathology is pending.  Discussed with the patient-the options include repeat CAR-T as part of clinical trial; bispecific antibodies therapies [first month/cycle has begun at [facility]]; third option being lonca.  Discussed with Dr. Noralee Stain at [facility].  Also emailed Dr. Noralee Stain regarding follow-up appointment to discuss the treatment options.  # Borderline LOW EF of 45-50%; Sep Echo 50-55% [Dr.Gollan]- on coreg BID -stable; discussed with Dr. Larene Pickett repeat 2D echo; repeat cardiac evaluation on 1/29- pending.   # Infectious Disease/Prophylaxis: Currently OFF valtrex/Bactrim DS. Check CD 4 counts.   # Cirrhosis- based Imaging [No decompensation]-no prior history of hepatitis B or C.  # High risk of tumor lysis/  elevated uric acid-10- [ Hx of rash  to allopurinol]- Febuxostat start at 40 mg a day x1 week, and then increase to 80 mg a day.   # PIV- s/p port explanation  # DISPOSITION:  # follow up in 1 week/Tuesday- MD; labs- cbc/cmp/ldh; uric acid;  hold tube; D-2 possible1 unit PRBC- Dr.B    All questions were answered. The patient knows to call the clinic with any problems, questions or concerns.    Earna Coder, MD 11/21/2023 11:49 AM

## 2023-11-22 ENCOUNTER — Telehealth: Payer: Self-pay | Admitting: *Deleted

## 2023-11-22 LAB — HAPTOGLOBIN: Haptoglobin: <10 mg/dL — ABNORMAL LOW (ref 32–363)

## 2023-11-22 MED ORDER — PREDNISONE 20 MG PO TABS
ORAL_TABLET | ORAL | 0 refills | Status: DC
Start: 2023-11-22 — End: 2023-11-29

## 2023-11-22 NOTE — Telephone Encounter (Signed)
[facility] and they prednisone seems to a lot to give to the patient.  I spoke to  [place]  and the patient is Large B cell lymphoma and he wants the pt. To take 5 pills daily until he comes back to office on 1/28and then he will let us know what to do going forward. The pharmcy is now told of the above

## 2023-11-22 NOTE — Telephone Encounter (Signed)
Spoke to pt re: low hemoglobin- recommend prednisone 100 mg a day-given hemoptysis.  Patient appointment with [facility], Dr.Grover on 1/22.   Keep appointment as planned next week.

## 2023-11-23 ENCOUNTER — Encounter: Payer: Self-pay | Admitting: Internal Medicine

## 2023-11-23 ENCOUNTER — Other Ambulatory Visit: Payer: Self-pay | Admitting: Internal Medicine

## 2023-11-23 DIAGNOSIS — C8330 Diffuse large B-cell lymphoma, unspecified site: Secondary | ICD-10-CM | POA: Diagnosis not present

## 2023-11-23 DIAGNOSIS — C8590 Non-Hodgkin lymphoma, unspecified, unspecified site: Secondary | ICD-10-CM

## 2023-11-23 NOTE — Progress Notes (Signed)
ON PATHWAY REGIMEN - Lymphoma and CLL  No Change  Continue With Treatment as Ordered.  Original Decision Date/Time: 08/15/2020 10:22     A cycle is every 21 days:     Rituximab-xxxx      Polatuzumab vedotin-piiq      Bendamustine   **Always confirm dose/schedule in your pharmacy ordering system**  Patient Characteristics: Diffuse Large B-Cell Lymphoma or Follicular Lymphoma, Grade 3B, Relapsed / Refractory, All Stages, Third Line and Beyond, Not a Candidate for CAR T-Cell Therapy Disease Type: Not Applicable Disease Type: Diffuse Large B-Cell Lymphoma Disease Type: Not Applicable Line of therapy: Relapsed / Refractory - Third Line and Beyond Patient Characteristics: Not a Candidate for CAR T-Cell Therapy Intent of Therapy: Curative Intent, Discussed with Patient

## 2023-11-23 NOTE — Progress Notes (Signed)
Spoke to Dr. Noralee Stain at [facility] CAR-T evaluation targeted to kappa specific light chain.   As bridging therapy recommend-Pola-Rituximab- [hold Bendamustine];   Chemotherapy ordered await approval scheduling.

## 2023-11-23 NOTE — Progress Notes (Signed)
Pharmacist Chemotherapy Monitoring - Initial Assessment    Anticipated start date: 11/29/23   The following has been reviewed per standard work regarding the patient's treatment regimen: The patient's diagnosis, treatment plan and drug doses, and organ/hematologic function Lab orders and baseline tests specific to treatment regimen  The treatment plan start date, drug sequencing, and pre-medications Prior authorization status  Patient's documented medication list, including drug-drug interaction screen and prescriptions for anti-emetics and supportive care specific to the treatment regimen The drug concentrations, fluid compatibility, administration routes, and timing of the medications to be used The patient's access for treatment and lifetime cumulative dose history, if applicable  The patient's medication allergies and previous infusion related reactions, if applicable   Changes made to treatment plan:  MD pref split Day 1 = RTX; Day 2 = Polatuzumab Need to adjust premeds (MD used Pola/BR plan & removed Bendamustine).  Polatuzumab low emetogenicity (+ Compazine?).  Follow up needed:  Pt has Rx for Prednisone daily.  Msg'd Dr B about premeds & if he wants pt to get Dex IV.    Need home antiemetics?  ECHO pending - per secure chat, Dr. B states pt does not need to have the results of the 2 d echo to start the chemo-   Prophylaxis for PJP and herpesvirus recommended throughout treatment - MD noted pt currently OFF valtrex/Bactrim DS.  Restart?  TLS prophylaxis - pt allergic to Allopurinol.  Is on febuxostat.  Hep B panel negative in 2020.  F/U tol of RTX for consideration of rapid & switch if appropriate (note pt has tolerated rapid Ruxience in 2020-21).     Ebony Hail, Pharm.D., CPP 11/23/2023@4 :56 PM

## 2023-11-24 ENCOUNTER — Other Ambulatory Visit: Payer: Self-pay

## 2023-11-24 ENCOUNTER — Encounter: Payer: Self-pay | Admitting: Internal Medicine

## 2023-11-28 ENCOUNTER — Encounter: Payer: Self-pay | Admitting: Internal Medicine

## 2023-11-29 ENCOUNTER — Inpatient Hospital Stay: Payer: Medicare Other

## 2023-11-29 ENCOUNTER — Inpatient Hospital Stay: Payer: Medicare Other | Admitting: Internal Medicine

## 2023-11-29 ENCOUNTER — Encounter: Payer: Self-pay | Admitting: Internal Medicine

## 2023-11-29 VITALS — BP 128/60 | HR 73 | Temp 99.0°F | Wt 299.4 lb

## 2023-11-29 VITALS — BP 107/57 | HR 65 | Temp 98.7°F | Resp 18

## 2023-11-29 DIAGNOSIS — K7460 Unspecified cirrhosis of liver: Secondary | ICD-10-CM | POA: Diagnosis not present

## 2023-11-29 DIAGNOSIS — Z86711 Personal history of pulmonary embolism: Secondary | ICD-10-CM | POA: Diagnosis not present

## 2023-11-29 DIAGNOSIS — C8590 Non-Hodgkin lymphoma, unspecified, unspecified site: Secondary | ICD-10-CM

## 2023-11-29 DIAGNOSIS — C83398 Diffuse large b-cell lymphoma of other extranodal and solid organ sites: Secondary | ICD-10-CM | POA: Diagnosis not present

## 2023-11-29 DIAGNOSIS — Z7901 Long term (current) use of anticoagulants: Secondary | ICD-10-CM | POA: Diagnosis not present

## 2023-11-29 DIAGNOSIS — R918 Other nonspecific abnormal finding of lung field: Secondary | ICD-10-CM | POA: Diagnosis not present

## 2023-11-29 DIAGNOSIS — Z5112 Encounter for antineoplastic immunotherapy: Secondary | ICD-10-CM | POA: Diagnosis not present

## 2023-11-29 DIAGNOSIS — D649 Anemia, unspecified: Secondary | ICD-10-CM | POA: Diagnosis not present

## 2023-11-29 LAB — CMP (CANCER CENTER ONLY)
ALT: 25 U/L (ref 0–44)
AST: 33 U/L (ref 15–41)
Albumin: 3.4 g/dL — ABNORMAL LOW (ref 3.5–5.0)
Alkaline Phosphatase: 96 U/L (ref 38–126)
Anion gap: 10 (ref 5–15)
BUN: 34 mg/dL — ABNORMAL HIGH (ref 8–23)
CO2: 27 mmol/L (ref 22–32)
Calcium: 9.6 mg/dL (ref 8.9–10.3)
Chloride: 103 mmol/L (ref 98–111)
Creatinine: 1.16 mg/dL (ref 0.61–1.24)
GFR, Estimated: >60 mL/min (ref >60)
Glucose, Bld: 115 mg/dL — ABNORMAL HIGH (ref 70–99)
Potassium: 4.4 mmol/L (ref 3.5–5.1)
Sodium: 140 mmol/L (ref 135–145)
Total Bilirubin: 1.9 mg/dL — ABNORMAL HIGH (ref 0.0–1.2)
Total Protein: 5.3 g/dL — ABNORMAL LOW (ref 6.5–8.1)

## 2023-11-29 LAB — CBC WITH DIFFERENTIAL (CANCER CENTER ONLY)
Abs Immature Granulocytes: 0.46 10*3/uL — ABNORMAL HIGH (ref 0.00–0.07)
Basophils Absolute: 0 10*3/uL (ref 0.0–0.1)
Basophils Relative: 0 %
Eosinophils Absolute: 0 10*3/uL (ref 0.0–0.5)
Eosinophils Relative: 0 %
HCT: 29.1 % — ABNORMAL LOW (ref 39.0–52.0)
Hemoglobin: 8.9 g/dL — ABNORMAL LOW (ref 13.0–17.0)
Immature Granulocytes: 5 %
Lymphocytes Relative: 9 %
Lymphs Abs: 0.9 10*3/uL (ref 0.7–4.0)
MCH: 32.2 pg (ref 26.0–34.0)
MCHC: 30.6 g/dL (ref 30.0–36.0)
MCV: 105.4 fL — ABNORMAL HIGH (ref 80.0–100.0)
Monocytes Absolute: 0.9 10*3/uL (ref 0.1–1.0)
Monocytes Relative: 9 %
Neutro Abs: 7.7 10*3/uL (ref 1.7–7.7)
Neutrophils Relative %: 77 %
Platelet Count: 111 10*3/uL — ABNORMAL LOW (ref 150–400)
RBC: 2.76 MIL/uL — ABNORMAL LOW (ref 4.22–5.81)
RDW: 22.7 % — ABNORMAL HIGH (ref 11.5–15.5)
WBC Count: 9.9 10*3/uL (ref 4.0–10.5)
nRBC: 0.5 % — ABNORMAL HIGH (ref 0.0–0.2)

## 2023-11-29 LAB — SAMPLE TO BLOOD BANK

## 2023-11-29 LAB — URIC ACID: Uric Acid, Serum: 6.8 mg/dL (ref 3.7–8.6)

## 2023-11-29 LAB — LACTATE DEHYDROGENASE: LDH: 403 U/L — ABNORMAL HIGH (ref 98–192)

## 2023-11-29 MED ORDER — SULFAMETHOXAZOLE-TRIMETHOPRIM 800-160 MG PO TABS: ORAL_TABLET | ORAL | 1 refills | Status: DC

## 2023-11-29 MED ORDER — PROCHLORPERAZINE MALEATE 10 MG PO TABS: 10.0000 mg | ORAL_TABLET | Freq: Four times a day (QID) | ORAL | 1 refills | Status: DC | PRN

## 2023-11-29 MED ORDER — ONDANSETRON HCL 8 MG PO TABS: ORAL_TABLET | ORAL | 1 refills | Status: DC

## 2023-11-29 MED ORDER — VALACYCLOVIR HCL 500 MG PO TABS: 500.0000 mg | ORAL_TABLET | Freq: Two times a day (BID) | ORAL | 4 refills | Status: DC

## 2023-11-29 MED ORDER — PREDNISONE 50 MG PO TABS: ORAL_TABLET | ORAL | 5 refills | Status: DC

## 2023-11-29 MED ORDER — ACETAMINOPHEN 325 MG PO TABS
650.0000 mg | ORAL_TABLET | Freq: Once | ORAL | Status: AC
  Administered 2023-11-29: 650 mg via ORAL
  Filled 2023-11-29: qty 2

## 2023-11-29 MED ORDER — SODIUM CHLORIDE 0.9 % IV SOLN
INTRAVENOUS | Status: DC
  Filled 2023-11-29: qty 250

## 2023-11-29 MED ORDER — SODIUM CHLORIDE 0.9 % IV SOLN
375.0000 mg/m2 | Freq: Once | INTRAVENOUS | Status: AC
  Administered 2023-11-29: 1000 mg via INTRAVENOUS
  Filled 2023-11-29: qty 100

## 2023-11-29 MED ORDER — DIPHENHYDRAMINE HCL 25 MG PO CAPS
50.0000 mg | ORAL_CAPSULE | Freq: Once | ORAL | Status: AC
Start: 2023-11-29 — End: 2023-11-29
  Administered 2023-11-29: 50 mg via ORAL
  Filled 2023-11-29: qty 2

## 2023-11-29 NOTE — Progress Notes (Signed)
Echo tomorrow morning at 8:30, and infusion at 10:30, wants to make sure this is ok.

## 2023-11-29 NOTE — Patient Instructions (Signed)
[facility] - A DEPT OF [facility]  Discharge Instructions: Thank you for choosing [facility] to provide your oncology and hematology care.  If you have a lab appointment with the Cancer Center, please go directly to the Cancer Center and check in at the registration area.  Wear comfortable clothing and clothing appropriate for easy access to any Portacath or PICC line.   We strive to give you quality time with your provider. You may need to reschedule your appointment if you arrive late (15 or more minutes).  Arriving late affects you and other patients whose appointments are after yours.  Also, if you miss three or more appointments without notifying the office, you may be dismissed from the clinic at the provider's discretion.      For prescription refill requests, have your pharmacy contact our office and allow 72 hours for refills to be completed.     To help prevent nausea and vomiting after your treatment, we encourage you to take your nausea medication as directed.  BELOW ARE SYMPTOMS THAT SHOULD BE REPORTED IMMEDIATELY: *FEVER GREATER THAN 100.4 F (38 C) OR HIGHER *CHILLS OR SWEATING *NAUSEA AND VOMITING THAT IS NOT CONTROLLED WITH YOUR NAUSEA MEDICATION *UNUSUAL SHORTNESS OF BREATH *UNUSUAL BRUISING OR BLEEDING *URINARY PROBLEMS (pain or burning when urinating, or frequent urination) *BOWEL PROBLEMS (unusual diarrhea, constipation, pain near the anus) TENDERNESS IN MOUTH AND THROAT WITH OR WITHOUT PRESENCE OF ULCERS (sore throat, sores in mouth, or a toothache) UNUSUAL RASH, SWELLING OR PAIN  UNUSUAL VAGINAL DISCHARGE OR ITCHING   Items with * indicate a potential emergency and should be followed up as soon as possible or go to the Emergency Department if any problems should occur.  Please show the CHEMOTHERAPY ALERT CARD or IMMUNOTHERAPY ALERT CARD at check-in to the Emergency Department and triage nurse.  Should you have  questions after your visit or need to cancel or reschedule your appointment, please contact [facility] - A DEPT OF [facility]  [phone] and follow the prompts.  Office hours are 8:00 a.m. to 4:30 p.m. Monday - Friday. Please note that voicemails left after 4:00 p.m. may not be returned until the following business day.  We are closed weekends and major holidays. You have access to a nurse at all times for urgent questions. Please call the main number to the clinic [phone] and follow the prompts.  For any non-urgent questions, you may also contact your provider using MyChart. We now offer e-Visits for anyone 78 and older to request care online for non-urgent symptoms. For details visit [email or web address].   Also download the MyChart app! Go to the app store, search "MyChart", open the app, select [place], and log in with your MyChart username and password.

## 2023-11-29 NOTE — Progress Notes (Signed)
[facility] CONSULT NOTE  Patient Care Team: Malva Limes, MD as PCP - General (Family Medicine) Lemar Livings, Merrily Pew, MD (General Surgery) Earna Coder, MD as Consulting Physician (Internal Medicine)  CHIEF COMPLAINTS/PURPOSE OF CONSULTATION: Diffuse large B-cell lymphoma  #  Oncology History Overview Note  #June 29, 2019-CT- bulky bilateral neck adenopathy left more than right; September 2020-Diffuse B-cell lymphoma; ABC subtype; NEGATIVE FISH studies.  PET scan -bulky adenopathy involving neck /lymphadenopathy mediastinum /abdomen/pelvis inguinal lymph nodes; splenic involvement.  Question bone.  Stage IV [spleen involvement; no bone marrow biopsy].  BCL-2: Positive /BCL 6: Positive /MUM-1: Positive /CD10: Negative 'Ki-67: High proliferation index (90%) /CD30: Negative /Cyclin D1: Negative C-MYC: Equivocal (20% staining seen on limited and fragmented tissue) /EBER: Negative   # Ritux-Pred- 9/11; 9/18- R-COP [held adria- sec to EF 45-50%]  #May 2021-PET scan left neck recurrent disease; excisional biopsy [Dr. Vaught]-recurrent diffuse left B-cell lymphoma  #November 2020-bilateral PE; on xarelto  #J07/07/2020- 2021- Gem-CarboDex [II opinion [facility]; Dr. Noralee Stain; stem cell transplant eval]; no Rituxan-repeat CD20 negative.   #S/p 2 cycles-gem carbo Dex-June 18, 2020 PET scan-slight increase in SUV uptake of the left cervical lymph nodes; no distant metastatic disease.  Discontinued chemotherapy; refer to CAR-T;  OCT 12th 2021- [facility]- -New extensive cervical, supraclavicular, intrathoracic, axillary and abdominal and pelvic lymph nodes, worrisome for lymphoma involvement. New splenomegaly with new, diffuse uptake, now more intense than the liver, concerning for lymphoma involvement. S/p POLA-BENDA  # CAR-T/Axi-cel on 09/16/20 preceded by fludarabine/cyclophosphamide lymphodepletion.[[facility]; Dr.Grover]; complicated by CRS-G-3 [ICU]; dec 27th-PET CR.  # JAN 2025- CT/PET  scan-widespread lymphadenopathy; splenic enlargement concerning for recurrent lymphoma  # JAN 16th, 2025- LN Bx-1. Lymph node, biopsy, Right supraclavicular :      -  FINDINGS CONSISTENT WITH RECURRENT/PERSISTENT DIFFUSE LARGE B-CELL LYMPHOMA.      NOTE: THE THIN NEEDLE CORE BIOPSIES CONSISTS OF SHEETS OF MODERATE TO LARGE      SIZED CD20 POSITIVE B-CELL THAT COEXPRESS MUM1, BCL-2 AND BCL6 AND ARE NEGATIVE      FOR CD10 CONSISTENT WITH A POST GERMINAL CENTER/ACTIVATED IMMUNOPHENOTYPE.      CD3/CD5 HIGHLIGHTS SMALL BACKGROUND T CELLS.  CD23 IS NEGATIVE FOR A FOLLICULAR      DENDRITIC CELL M[place].  THE PROLIFERATION RATE IS HIGH AT APPROXIMATELY 75% BY      KI-67.  FLOW CYTOMETRY IDENTIFIES STRONGLY KAPPA RESTRICTED B CELLS WITH A      NONSPECIFIC IMMUNOPHENOTYPE (CD5/CD10 NEGATIVE) WITH APPARENT LARGE SIZE BY      FORWARD SCATTER   # JAN 28th, 2025- Pola-Rituxan [bridging]   # Sep 2020- Borderline low EF/Left ventricular hypokinesis [Dr.Gollan]-s October 9th stress test-nonischemic;   # DIAGNOSIS: DLBCL     Lymphoma of lymph nodes (HCC)  07/03/2019 Initial Diagnosis   Lymphoma of lymph nodes (HCC)   07/13/2019 - 12/07/2019 Chemotherapy   The patient had palonosetron (ALOXI) injection 0.25 mg, 0.25 mg, Intravenous,  Once, 7 of 7 cycles Administration: 0.25 mg (07/20/2019), 0.25 mg (08/13/2019), 0.25 mg (11/13/2019), 0.25 mg (09/03/2019), 0.25 mg (10/02/2019), 0.25 mg (10/23/2019), 0.25 mg (12/04/2019) pegfilgrastim (NEULASTA ONPRO KIT) injection 6 mg, 6 mg, Subcutaneous, Once, 2 of 2 cycles Administration: 6 mg (10/04/2019), 6 mg (10/25/2019) pegfilgrastim-cbqv (UDENYCA) injection 6 mg, 6 mg, Subcutaneous, Once, 5 of 5 cycles Administration: 6 mg (07/23/2019), 6 mg (08/16/2019), 6 mg (09/06/2019), 6 mg (11/16/2019), 6 mg (12/07/2019) vinCRIStine (ONCOVIN) 2 mg in sodium chloride 0.9 % 50 mL chemo infusion, 2 mg, Intravenous,  Once,  7 of 7 cycles Administration: 2 mg (07/20/2019), 2 mg (08/13/2019), 2  mg (11/13/2019), 2 mg (09/03/2019), 2 mg (10/02/2019), 2 mg (10/23/2019), 2 mg (12/04/2019) cyclophosphamide (CYTOXAN) 1,900 mg in sodium chloride 0.9 % 250 mL chemo infusion, 750 mg/m2 = 1,900 mg, Intravenous,  Once, 7 of 7 cycles Administration: 1,900 mg (07/20/2019), 1,900 mg (08/13/2019), 2,000 mg (11/13/2019), 1,900 mg (09/03/2019), 2,000 mg (10/02/2019), 2,000 mg (10/23/2019), 2,000 mg (12/04/2019) etoposide (VEPESID) 250 mg in sodium chloride 0.9 % 1,000 mL chemo infusion, 100 mg/m2 = 250 mg, Intravenous,  Once, 6 of 6 cycles Dose modification: 50 mg/m2 (original dose 100 mg/m2, Cycle 3, Reason: Provider Judgment) Administration: 130 mg (08/14/2019), 130 mg (08/15/2019), 130 mg (09/03/2019), 130 mg (09/04/2019), 130 mg (09/05/2019), 130 mg (10/03/2019), 130 mg (10/04/2019), 130 mg (10/24/2019), 130 mg (10/25/2019), 130 mg (11/13/2019), 130 mg (11/14/2019), 130 mg (11/15/2019), 130 mg (10/02/2019), 130 mg (10/23/2019), 130 mg (12/04/2019), 130 mg (12/05/2019), 130 mg (12/06/2019) dexamethasone (DECADRON) 12 mg in sodium chloride 0.9 % 145 mL IVPB, , Intravenous,  Once, 8 of 8 cycles Administration:  (07/13/2019),  (07/20/2019),  (08/13/2019),  (11/13/2019),  (09/03/2019),  (10/02/2019),  (10/23/2019),  (12/04/2019) riTUXimab-pvvr (RUXIENCE) 900 mg in sodium chloride 0.9 % 250 mL (2.6471 mg/mL) infusion, 375 mg/m2 = 900 mg (100 % of original dose 375 mg/m2), Intravenous,  Once, 1 of 1 cycle Dose modification: 375 mg/m2 (original dose 375 mg/m2, Cycle 1) Administration: 900 mg (07/13/2019)  for chemotherapy treatment.    05/09/2020 - 06/06/2020 Chemotherapy   The patient had palonosetron (ALOXI) injection 0.25 mg, 0.25 mg, Intravenous,  Once, 2 of 4 cycles Administration: 0.25 mg (05/09/2020), 0.25 mg (05/30/2020) CARBOplatin (PARAPLATIN) 750 mg in sodium chloride 0.9 % 250 mL chemo infusion, 750 mg (100 % of original dose 750 mg), Intravenous,  Once, 2 of 4 cycles Dose modification:   (original dose 750 mg, Cycle 1, Reason: Provider  Judgment, Comment: labs from care every where. ) Administration: 750 mg (05/09/2020), 750 mg (05/30/2020) gemcitabine (GEMZAR) 2,500 mg in sodium chloride 0.9 % 250 mL chemo infusion, 2,584 mg, Intravenous,  Once, 2 of 4 cycles Administration: 2,500 mg (05/09/2020), 2,500 mg (05/16/2020), 2,500 mg (05/30/2020), 2,500 mg (06/06/2020) fosaprepitant (EMEND) 150 mg in sodium chloride 0.9 % 145 mL IVPB, 150 mg, Intravenous,  Once, 2 of 4 cycles Administration: 150 mg (05/09/2020), 150 mg (05/30/2020)  for chemotherapy treatment.    08/27/2020 - 08/27/2020 Chemotherapy   Patient is on Treatment Plan : NON-HODGKINS LYMPHOMA RELAPSED/ REFRACTORY DLBCL Polatuzumab + Bendamustine + Rituximab q21d x 6 cycles     11/13/2020 Cancer Staging   Staging form: Hodgkin and Non-Hodgkin Lymphoma, AJCC 8th Edition - Clinical: Stage IV (Diffuse large B-cell lymphoma) - Signed by Earna Coder, MD on 11/13/2020   11/29/2023 -  Chemotherapy   Patient is on Treatment Plan : NON-HODGKINS LYMPHOMA RELAPSED/ REFRACTORY DLBCL Polatuzumab D1 ++ Rituximab D1 q21d x 6 cycles       HISTORY OF PRESENTING ILLNESS: Ambulating independently.  Accompanied by his wife.  Eric [name] 73 y.o.  male relapsed/refractory diffuse large B-cell lymphoma currently s/p CART therapy at [facility] in November 2021-is here to proceed with salvage chemotherapy.  Patient noted to have worsening swelling of his bilateral neck lymph nodes.  He poor appetite.  Very anxious.  With starting the steroids last week for hemolytic anemia-his energy levels are improved.  He admits compliance to his other medications.  No fever no chills.  No nausea vomiting.   Review  of Systems  Constitutional:  Negative for chills, diaphoresis and fever.  HENT:  Positive for hearing loss. Negative for nosebleeds and sore throat.   Eyes:  Negative for double vision.  Respiratory:  Negative for hemoptysis, sputum production and wheezing.   Cardiovascular:  Negative for chest  pain, palpitations, orthopnea and leg swelling.  Gastrointestinal:  Negative for abdominal pain, blood in stool, constipation, diarrhea, heartburn, melena, nausea and vomiting.  Genitourinary:  Negative for dysuria, frequency and urgency.  Musculoskeletal:  Negative for back pain and joint pain.  Skin: Negative.  Negative for itching and rash.  Neurological:  Negative for dizziness, tingling, focal weakness, weakness and headaches.  Endo/Heme/Allergies:  Does not bruise/bleed easily.  Psychiatric/Behavioral:  Negative for depression. The patient does not have insomnia.      MEDICAL HISTORY:  Past Medical History:  Diagnosis Date   Adopted person    Cancer [facility])    Colon polyps    Pancreatitis    Rectal bleeding 02/24/2014   Sepsis (HCC) 09/18/2019    SURGICAL HISTORY: Past Surgical History:  Procedure Laterality Date   CHOLECYSTECTOMY     COLONOSCOPY  02/28/14   IR IMAGING GUIDED PORT INSERTION  07/18/2019   IR REMOVAL TUN ACCESS W/ PORT W/O FL MOD SED  05/29/2021   LYMPH NODE BIOPSY Left 03/28/2020   Procedure: EXCISION OF SUBMANDIBULAR  LYMPH NODE;  Surgeon: Bud Face, MD;  Location: [facility];  Service: ENT;  Laterality: Left;   POLYPECTOMY     colon polyp removed   TONSILLECTOMY      SOCIAL HISTORY: Social History   Socioeconomic History   Marital status: Married    Spouse name: Not on file   Number of children: Not on file   Years of education: Not on file   Highest education level: 12th grade  Occupational History    Comment: Retired from [employer]  Tobacco Use   Smoking status: Every Day    Current packs/day: 1.00    Average packs/day: 1 pack/day for 20.0 years (20.0 ttl pk-yrs)    Types: Cigars, Cigarettes   Smokeless tobacco: Never   Tobacco comments:    smoke 1/2 ppd cigaretes since 73 yo. changed to cigars at age July 2004.   Vaping Use   Vaping status: Never Used  Substance and Sexual Activity   Alcohol use: Yes    Comment: 6-7 drinks  weekly   Drug use: No   Sexual activity: Not on file  Other Topics Concern   Not on file  Social History Narrative   Lives at home with wife; in [place]. retd from [employer]. Cigar smoker; drinks liquor. Children - grown up.    Social Drivers of Corporate investment banker Strain: Low Risk  (08/22/2023)   Overall Financial Resource Strain (CARDIA)    Difficulty of Paying Living Expenses: Not hard at all  Food Insecurity: No Food Insecurity (08/22/2023)   Hunger Vital Sign    Worried About Running Out of Food in the Last Year: Never true    Ran Out of Food in the Last Year: Never true  Transportation Needs: No Transportation Needs (08/22/2023)   PRAPARE - Administrator, Civil Service (Medical): No    Lack of Transportation (Non-Medical): No  Physical Activity: Insufficiently Active (08/22/2023)   Exercise Vital Sign    Days of Exercise per Week: 1 day    Minutes of Exercise per Session: 10 min  Stress: No Stress Concern Present (08/22/2023)  [employer] of Occupational Health - Occupational Stress Questionnaire    Feeling of Stress : Only a little  Social Connections: Moderately Isolated (08/22/2023)   Social Connection and Isolation Panel [NHANES]    Frequency of Communication with Friends and Family: Once a week    Frequency of Social Gatherings with Friends and Family: Twice a week    Attends Religious Services: Never    Database administrator or Organizations: No    Attends Engineer, structural: Not on file    Marital Status: Married  Catering manager Violence: Not on file    FAMILY HISTORY: Family History  Adopted: Yes    ALLERGIES:  is allergic to allopurinol, azithromycin, and losartan potassium.  MEDICATIONS:  Current Outpatient Medications  Medication Sig Dispense Refill   febuxostat (ULORIC) 40 MG tablet Take 40 mg a day x1 week, and then increase to 80 mg a day. 60 tablet 1   Multiple Vitamin (MULTI-VITAMIN) tablet Take 1 tablet  by mouth daily.     predniSONE (DELTASONE) 50 MG tablet Take 2 tablets once day x 5 days.Take in AM; with FOOD. 10 tablet 5   ondansetron (ZOFRAN) 8 MG tablet One pill every 8 hours as needed for nausea/vomitting. (Patient not taking: Reported on 11/29/2023) 40 tablet 1   prochlorperazine (COMPAZINE) 10 MG tablet Take 1 tablet (10 mg total) by mouth every 6 (six) hours as needed for nausea or vomiting. (Patient not taking: Reported on 11/29/2023) 40 tablet 1   No current facility-administered medications for this visit.   Facility-Administered Medications Ordered in Other Visits  Medication Dose Route Frequency Provider Last Rate Last Admin   0.9 %  sodium chloride infusion   Intravenous Continuous Donneta Romberg, Worthy Flank, MD       acetaminophen (TYLENOL) tablet 650 mg  650 mg Oral Once Earna Coder, MD       diphenhydrAMINE (BENADRYL) capsule 50 mg  50 mg Oral Once Earna Coder, MD       riTUXimab-pvvr (RUXIENCE) 1,000 mg in sodium chloride 0.9 % 250 mL (2.8571 mg/mL) infusion  375 mg/m2 (Treatment Plan Recorded) Intravenous Once Earna Coder, MD          .  PHYSICAL EXAMINATION: ECOG PERFORMANCE STATUS: 1 - Symptomatic but completely ambulatory  Vitals:   11/29/23 0935  BP: 128/60  Pulse: 73  Temp: 99 F (37.2 C)  SpO2: 98%     Filed Weights   11/29/23 0935  Weight: 299 lb 5.8 oz (135.8 kg)     Positive for bilateral axillary adenopathy.   Physical Exam HENT:     Head: Normocephalic and atraumatic.     Mouth/Throat:     Pharynx: No oropharyngeal exudate.  Eyes:     Pupils: Pupils are equal, round, and reactive to light.  Cardiovascular:     Rate and Rhythm: Normal rate and regular rhythm.  Pulmonary:     Effort: Pulmonary effort is normal. No respiratory distress.     Breath sounds: Normal breath sounds. No wheezing.  Abdominal:     General: Bowel sounds are normal. There is no distension.     Palpations: Abdomen is soft. There is no mass.      Tenderness: There is no abdominal tenderness. There is no guarding or rebound.  Musculoskeletal:        General: No tenderness. Normal range of motion.     Cervical back: Normal range of motion and neck supple.  Skin:    General:  Skin is warm.  Neurological:     Mental Status: He is alert and oriented to person, place, and time.  Psychiatric:        Mood and Affect: Affect normal.    LABORATORY DATA:  I have reviewed the data as listed Lab Results  Component Value Date   WBC 9.9 11/29/2023   HGB 8.9 (L) 11/29/2023   HCT 29.1 (L) 11/29/2023   MCV 105.4 (H) 11/29/2023   PLT 111 (L) 11/29/2023   Recent Labs    11/14/23 0944 11/21/23 0856 11/29/23 0916  NA 139 139 140  K 4.4 4.2 4.4  CL 101 104 103  CO2 29 26 27   GLUCOSE 103* 94 115*  BUN 24* 27* 34*  CREATININE 1.06 1.30* 1.16  CALCIUM 9.0 9.0 9.6  GFRNONAA >60 59* >60  PROT 5.5* 5.5* 5.3*  ALBUMIN 3.6 3.5 3.4*  AST 34 33 33  ALT 17 18 25   ALKPHOS 76 72 96  BILITOT 1.9* 1.9* 1.9*    RADIOGRAPHIC STUDIES: I have personally reviewed the radiological images as listed and agreed with the findings in the report. [place] CORE BIOPSY (LYMPH NODES) Result Date: 11/17/2023 INDICATION: History of lymphoma. Please perform ultrasound-guided right supraclavicular lymph node biopsy for tissue diagnostic purposes. EXAM: ULTRASOUND-GUIDED RIGHT SUPRACLAVICULAR LYMPH NODE BIOPSY COMPARISON:  PET-CT-10/31/2023; neck CT-10/17/2023 MEDICATIONS: None ANESTHESIA/SEDATION: None COMPLICATIONS: None immediate. TECHNIQUE: Informed written consent was obtained from the patient after a discussion of the risks, benefits and alternatives to treatment. Questions regarding the procedure were encouraged and answered. Initial ultrasound scanning demonstrated an enlarged at least 3.4 x 3 point 2 x 1.1 cm malignant appearing right supraclavicular lymph node correlating with the hypermetabolic supraclavicular lymph node seen on preceding PET-CT image 31,  series 607. an ultrasound image was saved for documentation purposes. The procedure was planned. A timeout was performed prior to the initiation of the procedure. The operative was prepped and draped in the usual sterile fashion, and a sterile drape was applied covering the operative field. A timeout was performed prior to the initiation of the procedure. Local anesthesia was provided with 1% lidocaine with epinephrine. Under direct ultrasound guidance, an 18 gauge core needle device was utilized to obtain to obtain 6 core needle biopsies of the hypermetabolic right supraclavicular lymph node. The samples were placed in saline and submitted to pathology. The needle was removed and superficial hemostasis was achieved with manual compression. Post procedure scan was negative for significant hematoma. A dressing was applied. The patient tolerated the procedure well without immediate postprocedural complication. IMPRESSION: Technically successful ultrasound guided biopsy of dominant hypermetabolic right supraclavicular lymph node. Electronically Signed   By: Simonne Come M.D.   On: 11/17/2023 16:46   NM PET Image Restage (PS) Skull Base to Thigh (F-18 FDG) Result Date: 11/03/2023 CLINICAL DATA:  Subsequent treatment strategy for lymphoma. EXAM: NUCLEAR MEDICINE PET SKULL BASE TO THIGH TECHNIQUE: 12.84 mCi F-18 FDG was injected intravenously. Full-ring PET imaging was performed from the skull base to thigh after the radiotracer. CT data was obtained and used for attenuation correction and anatomic localization. Fasting blood glucose: 87 mg/dl COMPARISON:  CT chest, abdomen and pelvis from 10/17/2023 and PET-CT from 06/18/2020 FINDINGS: Mediastinal blood pool activity: SUV max 3.20 Liver activity: SUV max 4.28 NECK: Extensive bilateral tracer avid cervical adenopathy is identified. Tracer avid lymph nodes are markedly increased in multiplicity when compared with the previous exam. For example: -new right level 2 lymph  node measures 1.6 cm with SUV max  of 17.66, image 23/6. -new right level 4 lymph node measures 1.6 cm with SUV max of 23.93, image 29/6. Marked increased radiotracer uptake within the posterior nasopharynx has an SUV max of 21.85 with corresponding increased soft tissue thickening, image 10/6. Bilateral increased uptake identified within the tonsils and base of tongue are identified. Also new from previous exam. On the left this has an SUV max of 19.53. On the right SUV max is equal to 18.17, image 25 of the fused PET-CT images. Incidental CT findings: None. CHEST: Bilateral supraclavicular, bilateral mediastinal, bilateral hilar and bilateral axillary lymph nodes are identified. These are all new when compared with the previous PET-CT from 06/18/2020. For example: -right paratracheal lymph node has an SUV max of measures 1.1 cm with SUV max of 26.8. -subcarinal lymph node measures 1.7 cm with SUV max of 29.24, image 54/6. -Right internal mammary lymph node measures 1.2 cm with SUV max of 10.69, image 57/6. -left axillary lymph node measures 1.6 cm with SUV max of 26.26, image 58/6. Small tiny lung nodules are identified which exhibit mild increased tracer uptake. -Index nodule within the posterior right lower lobe measures 5 mm with SUV max of 1.82. Technically too small to characterize by PET-CT, image 66/6. New compared with the previous PET-CT. Incidental CT findings: Aortic atherosclerosis and coronary artery calcifications. ABDOMEN/PELVIS: Marked splenomegaly. The spleen measures 21 cm in cranial caudal dimension. Numerous tracer avid lesions are scattered throughout the spleen, which are too numerous to count. These are suboptimally visualized on the CT images and therefore difficult to measure anatomically. Index lesion within the posterior spleen has an SUV max of 14.95 and measures approximately 2 cm, image 76 of the fused PET-CT images. Extensive abdominopelvic tracer avid lymph nodes. Index nodes  include: -Porto caval node measures 1.4 cm with SUV max of 21.43, image 88/6. -Index left mesenteric lymph node measures 1.6 cm with SUV max of 19.5, image 109/6. -Index left periaortic lymph node measures 1 cm with SUV max of 17.57, image 100/6. -Index left external iliac node measures 1.2 cm with SUV max of 10.18, image 130/6. -Right external iliac node measures 1.2 cm with SUV max of 14.3. Incidental CT findings: Multiple stones identified within the lower pole of the left kidney measuring up to 1.4 cm. 2 simple appearing left kidney cysts are noted. The largest measures 4.3 cm, image 99/6. No follow-up imaging recommended. Previous cholecystectomy. Aortic atherosclerotic calcifications. Sigmoid diverticulosis without signs of acute diverticulitis. SKELETON: Mild diffuse increased uptake identified throughout the bone marrow without focality. Incidental CT findings: None. IMPRESSION: 1. Extensive tracer avid adenopathy identified within the neck, chest, abdomen and pelvis. Findings are compatible with recurrent lymphoma. Deauville criteria 5 2. Marked splenomegaly with numerous tracer avid lesions scattered throughout the spleen. Too numerous to count. Findings are compatible with lymphoma. Deauville criteria 5 3. Marked increased radiotracer uptake within the posterior nasopharynx with corresponding increased soft tissue thickening. Bilateral increased uptake identified within the tonsils and base of tongue. Findings are compatible with lymphoma. Deauville criteria 4 and 5 4. Small tiny lung nodules are identified which exhibit mild increased tracer uptake. Technically too small to characterize by PET-CT. New compared with the previous exam. Findings are concerning for lymphoma. 5. Mild diffuse increased uptake identified throughout the bone marrow without focality. Findings are nonspecific and may be related to anemia or treatment related changes. 6. Left kidney stones. 7.  Aortic Atherosclerosis (ICD10-I70.0).  Electronically Signed   By: Veronda Prude.D.  On: 11/03/2023 16:02    ASSESSMENT & PLAN:   Lymphoma of lymph nodes (HCC) # Chemo refractory/ Relapsed diffuse large B-cell lymphoma-ABC subtype; IV [spleen]; NOV 2021- CART therapy.  DEC 30th, 2024- Extensive tracer avid adenopathy identified within the neck, chest, abdomen and pelvis. Findings are compatible with recurrent lymphoma. Marked splenomegaly with numerous tracer avid lesions scattered throughout the spleen. Too numerous to count.  Marked increased radiotracer uptake within the posterior nasopharynx with corresponding increased soft tissue thickening. Bilateral increased uptake identified within the tonsils and base of tongue.  Small tiny lung nodules are identified which exhibit mild increased tracer uptake. Mild diffuse increased uptake identified throughout the bone marrow without focality.   # JAN 2025-neck lymph node biopsy recurrent diffuse B-cell lymphoma-ABC subtype.  Discussed multiple options including- # Re-CART [clinical trial at [facility]]; bispecific antibodies treatment; [place].  Patient currently being evaluated for clinical trial at [facility].  # As a bridging therapy proceed with-POLA+ rituximab infusion-every 3 weeks [split-day 1 and 2]-x 2 cycles while awaiting-clinical trial at [facility].  # Hemolytic anemia -nadir hemoglobin 8 - platelets-98-currently on prednisone 100 mg a day x7 days- today Hb 8.9; platelets 111; will continue prednisone 100 mg for 5 more days and stop.   # Borderline LOW EF of 45-50%; Sep Echo 50-55% [Dr.Gollan]- on coreg BID -stable; discussed with Dr. Larene Pickett repeat 2D echo; repeat cardiac evaluation on 1/29- pending.   # Infectious Disease/Prophylaxis: will re-start valtrex/Bactrim DS.   # Cirrhosis- based Imaging [No decompensation]-no prior history of hepatitis B or C.  # High risk of tumor lysis/  elevated uric acid-10- [ Hx of rash to allopurinol]-currently on febuxostat  80 mg a day.   # PIV-  s/p port explanation  # DISPOSITION:  # follow up in 1 week- MD: labs- cbc/cmp/ldh; uric acid;  hold tube; D-2 possible1 unit PRBC  # follow up in 3 weeks-- MD;chemo-Pola-Rituxan- labs- cbc/cmp/ldh; uric acid;  hold tube; D-2 possible1 unit PRBC- Dr.B  # 40 minutes face-to-face with the patient discussing the above plan of care; more than 50% of time spent on prognosis/ natural history; counseling and coordination.     All questions were answered. The patient knows to call the clinic with any problems, questions or concerns.    Earna Coder, MD 11/29/2023 10:39 AM

## 2023-11-29 NOTE — Assessment & Plan Note (Addendum)
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  DEC 30th, 2024- Extensive tracer avid adenopathy identified within the neck, chest, abdomen and pelvis. Findings are compatible with recurrent lymphoma. Marked splenomegaly with numerous tracer avid lesions scattered throughout the spleen. Too numerous to count.  Marked increased radiotracer uptake within the posterior nasopharynx with corresponding increased soft tissue thickening. Bilateral increased uptake identified within the tonsils and base of tongue.  Small tiny lung nodules are identified which exhibit mild increased tracer uptake. Mild diffuse increased uptake identified throughout the bone marrow without focality.   # JAN 2025-neck lymph node biopsy recurrent diffuse B-cell lymphoma-ABC subtype.  Discussed multiple options including- # Re-CART [clinical trial at [facility]]; bispecific antibodies treatment; [facility].  Patient currently being evaluated for clinical trial at [facility].  # As a bridging therapy proceed with-POLA+ rituximab infusion-every 3 weeks [split-day 1 and 2]-x 2 cycles while awaiting-clinical trial at [facility].  # Hemolytic anemia -nadir hemoglobin 8 - platelets-98-currently on prednisone 100 mg a day x7 days- today Hb 8.9; platelets 111; will continue prednisone 100 mg for 5 more days and stop.   # Borderline LOW EF of 45-50%; Sep Echo 50-55% [Dr.Gollan]- on coreg BID -stable; discussed with Dr. Larene Pickett repeat 2D echo; repeat cardiac evaluation on 1/29- pending.   # Infectious Disease/Prophylaxis: will re-start valtrex/Bactrim DS.   # Cirrhosis- based Imaging [No decompensation]-no prior history of hepatitis B or C.  # High risk of tumor lysis/  elevated uric acid-10- [ Hx of rash to allopurinol]-currently on febuxostat  80 mg a day.   # PIV- s/p port explanation  # DISPOSITION:  # follow up in 1 week- MD: labs- cbc/cmp/ldh; uric acid;  hold tube; D-2 possible1 unit  PRBC  # follow up in 3 weeks-- MD;chemo-Pola-Rituxan- labs- cbc/cmp/ldh; uric acid;  hold tube; D-2 possible1 unit PRBC- Dr.B  # 40 minutes face-to-face with the patient discussing the above plan of care; more than 50% of time spent on prognosis/ natural history; counseling and coordination.

## 2023-11-30 ENCOUNTER — Ambulatory Visit: Payer: Medicare Other

## 2023-11-30 ENCOUNTER — Inpatient Hospital Stay: Payer: Medicare Other

## 2023-11-30 ENCOUNTER — Ambulatory Visit: Payer: Medicare Other | Attending: Cardiovascular Disease

## 2023-11-30 VITALS — BP 133/62 | HR 67 | Temp 98.8°F | Resp 18

## 2023-11-30 DIAGNOSIS — R918 Other nonspecific abnormal finding of lung field: Secondary | ICD-10-CM | POA: Diagnosis not present

## 2023-11-30 DIAGNOSIS — I420 Dilated cardiomyopathy: Secondary | ICD-10-CM | POA: Diagnosis not present

## 2023-11-30 DIAGNOSIS — K7460 Unspecified cirrhosis of liver: Secondary | ICD-10-CM | POA: Diagnosis not present

## 2023-11-30 DIAGNOSIS — C83398 Diffuse large b-cell lymphoma of other extranodal and solid organ sites: Secondary | ICD-10-CM | POA: Diagnosis not present

## 2023-11-30 DIAGNOSIS — Z7901 Long term (current) use of anticoagulants: Secondary | ICD-10-CM | POA: Diagnosis not present

## 2023-11-30 DIAGNOSIS — C8590 Non-Hodgkin lymphoma, unspecified, unspecified site: Secondary | ICD-10-CM

## 2023-11-30 DIAGNOSIS — D649 Anemia, unspecified: Secondary | ICD-10-CM | POA: Diagnosis not present

## 2023-11-30 DIAGNOSIS — Z5112 Encounter for antineoplastic immunotherapy: Secondary | ICD-10-CM | POA: Diagnosis not present

## 2023-11-30 DIAGNOSIS — Z86711 Personal history of pulmonary embolism: Secondary | ICD-10-CM | POA: Diagnosis not present

## 2023-11-30 MED ORDER — ACETAMINOPHEN 325 MG PO TABS
650.0000 mg | ORAL_TABLET | Freq: Once | ORAL | Status: AC
Start: 2023-11-30 — End: 2023-11-30
  Administered 2023-11-30: 650 mg via ORAL
  Filled 2023-11-30: qty 2

## 2023-11-30 MED ORDER — SODIUM CHLORIDE 0.9 % IV SOLN
1.8000 mg/kg | Freq: Once | INTRAVENOUS | Status: AC
  Administered 2023-11-30: 230 mg via INTRAVENOUS
  Filled 2023-11-30: qty 11.5

## 2023-11-30 MED ORDER — SODIUM CHLORIDE 0.9 % IV SOLN
INTRAVENOUS | Status: DC
  Filled 2023-11-30: qty 250

## 2023-11-30 MED ORDER — PROCHLORPERAZINE MALEATE 10 MG PO TABS
10.0000 mg | ORAL_TABLET | Freq: Once | ORAL | Status: AC
Start: 2023-11-30 — End: 2023-11-30
  Administered 2023-11-30: 10 mg via ORAL
  Filled 2023-11-30: qty 1

## 2023-11-30 MED ORDER — DIPHENHYDRAMINE HCL 25 MG PO CAPS
50.0000 mg | ORAL_CAPSULE | Freq: Once | ORAL | Status: AC
  Administered 2023-11-30: 50 mg via ORAL
  Filled 2023-11-30: qty 2

## 2023-11-30 MED ORDER — DEXAMETHASONE SODIUM PHOSPHATE 10 MG/ML IJ SOLN
4.0000 mg | Freq: Once | INTRAMUSCULAR | Status: AC
Start: 2023-11-30 — End: 2023-11-30
  Administered 2023-11-30: 4 mg via INTRAVENOUS
  Filled 2023-11-30: qty 1

## 2023-11-30 NOTE — Patient Instructions (Signed)
[facility] - A DEPT OF [facility]  Discharge Instructions: Thank you for choosing [facility] to provide your oncology and hematology care.  If you have a lab appointment with the Cancer Center, please go directly to the Cancer Center and check in at the registration area.  Wear comfortable clothing and clothing appropriate for easy access to any Portacath or PICC line.   We strive to give you quality time with your provider. You may need to reschedule your appointment if you arrive late (15 or more minutes).  Arriving late affects you and other patients whose appointments are after yours.  Also, if you miss three or more appointments without notifying the office, you may be dismissed from the clinic at the provider's discretion.      For prescription refill requests, have your pharmacy contact our office and allow 72 hours for refills to be completed.    Today you received the following chemotherapy and/or immunotherapy agents Polivy      To help prevent nausea and vomiting after your treatment, we encourage you to take your nausea medication as directed.  BELOW ARE SYMPTOMS THAT SHOULD BE REPORTED IMMEDIATELY: *FEVER GREATER THAN 100.4 F (38 C) OR HIGHER *CHILLS OR SWEATING *NAUSEA AND VOMITING THAT IS NOT CONTROLLED WITH YOUR NAUSEA MEDICATION *UNUSUAL SHORTNESS OF BREATH *UNUSUAL BRUISING OR BLEEDING *URINARY PROBLEMS (pain or burning when urinating, or frequent urination) *BOWEL PROBLEMS (unusual diarrhea, constipation, pain near the anus) TENDERNESS IN MOUTH AND THROAT WITH OR WITHOUT PRESENCE OF ULCERS (sore throat, sores in mouth, or a toothache) UNUSUAL RASH, SWELLING OR PAIN  UNUSUAL VAGINAL DISCHARGE OR ITCHING   Items with * indicate a potential emergency and should be followed up as soon as possible or go to the Emergency Department if any problems should occur.  Please show the CHEMOTHERAPY ALERT CARD or IMMUNOTHERAPY ALERT  CARD at check-in to the Emergency Department and triage nurse.  Should you have questions after your visit or need to cancel or reschedule your appointment, please contact [facility] - A DEPT OF [facility]  [phone] and follow the prompts.  Office hours are 8:00 a.m. to 4:30 p.m. Monday - Friday. Please note that voicemails left after 4:00 p.m. may not be returned until the following business day.  We are closed weekends and major holidays. You have access to a nurse at all times for urgent questions. Please call the main number to the clinic [phone] and follow the prompts.  For any non-urgent questions, you may also contact your provider using MyChart. We now offer e-Visits for anyone 44 and older to request care online for non-urgent symptoms. For details visit [email or web address].   Also download the MyChart app! Go to the app store, search "MyChart", open the app, select [place], and log in with your MyChart username and password.

## 2023-11-30 NOTE — Progress Notes (Signed)
Nutrition Assessment   Reason for Assessment:   Patient identified on Malnutrition Screening report for weight loss, poor appetite   ASSESSMENT:  73 year old male with recurrent b-cell lymphoma.  S/p CART therapy at [facility] (11/21).  Past medical history of pancreatitis, sepsis, bilateral PE, cardiomyopathy.  Receiving polivy  Met with patient during infusion.  Reports that is appetite is ok.  Usually eats greek yogurt with honey, granola and fruit for breakfast.  Lunch usually gets out to eat and is main meal of the day ([place] food, [place] food).  Snacks in the evening (cheese and crackers).  Has protein shakes on hand but does not drink.  Reports looser stool since starting prednisone.  Appetite has picked up some since starting steroids.   Medications: compazine, MVI, zofran, prednisone   Labs: glucose 115, BUN 34, creatinine 1.16   Anthropometrics:   Weight 299 lb on 1/28  293 lb on 1/13 309 lb on 8/14  3% weight loss in the last 6 months, not significant   NUTRITION DIAGNOSIS: none at this time   INTERVENTION:  Encouraged well balanced diet including lean protein foods.   Patient denies questions or concerns regarding nutrition Contact information provided    Next Visit: as needed RD available as needed  Jazmon Kos B. Freida Busman, RD, LDN Registered Dietitian [phone]

## 2023-12-01 LAB — ECHOCARDIOGRAM COMPLETE
AR max vel: 4.65 cm2
AV Area VTI: 4.94 cm2
AV Area mean vel: 4.7 cm2
AV Mean grad: 5 mm[Hg]
AV Peak grad: 9.6 mm[Hg]
Ao pk vel: 1.55 m/s
Area-P 1/2: 3.72 cm2
Calc EF: 50.9 %
S' Lateral: 4.7 cm
Single Plane A2C EF: 52.6 %
Single Plane A4C EF: 52.4 %

## 2023-12-01 LAB — G-6-PD, QUANT, BLOOD AND RBC
G-6-PD, Quant: 561 U/10*12{RBCs} — ABNORMAL HIGH (ref 127–427)
RBC: 2.85 x10E6/uL — ABNORMAL LOW (ref 4.14–5.80)

## 2023-12-02 ENCOUNTER — Encounter: Payer: Self-pay | Admitting: Cardiovascular Disease

## 2023-12-02 ENCOUNTER — Encounter: Payer: Self-pay | Admitting: Emergency Medicine

## 2023-12-06 ENCOUNTER — Inpatient Hospital Stay: Payer: Medicare Other | Attending: Internal Medicine

## 2023-12-06 DIAGNOSIS — C83398 Diffuse large b-cell lymphoma of other extranodal and solid organ sites: Secondary | ICD-10-CM | POA: Insufficient documentation

## 2023-12-06 DIAGNOSIS — C8590 Non-Hodgkin lymphoma, unspecified, unspecified site: Secondary | ICD-10-CM

## 2023-12-06 LAB — CBC WITH DIFFERENTIAL (CANCER CENTER ONLY)
Abs Immature Granulocytes: 0.55 10*3/uL — ABNORMAL HIGH (ref 0.00–0.07)
Basophils Absolute: 0 10*3/uL (ref 0.0–0.1)
Basophils Relative: 0 %
Eosinophils Absolute: 0 10*3/uL (ref 0.0–0.5)
Eosinophils Relative: 0 %
HCT: 29 % — ABNORMAL LOW (ref 39.0–52.0)
Hemoglobin: 9.1 g/dL — ABNORMAL LOW (ref 13.0–17.0)
Immature Granulocytes: 7 %
Lymphocytes Relative: 7 %
Lymphs Abs: 0.6 10*3/uL — ABNORMAL LOW (ref 0.7–4.0)
MCH: 32.9 pg (ref 26.0–34.0)
MCHC: 31.4 g/dL (ref 30.0–36.0)
MCV: 104.7 fL — ABNORMAL HIGH (ref 80.0–100.0)
Monocytes Absolute: 0.4 10*3/uL (ref 0.1–1.0)
Monocytes Relative: 5 %
Neutro Abs: 6.9 10*3/uL (ref 1.7–7.7)
Neutrophils Relative %: 81 %
Platelet Count: 105 10*3/uL — ABNORMAL LOW (ref 150–400)
RBC: 2.77 MIL/uL — ABNORMAL LOW (ref 4.22–5.81)
RDW: 20.8 % — ABNORMAL HIGH (ref 11.5–15.5)
Smear Review: NORMAL
WBC Count: 8.5 10*3/uL (ref 4.0–10.5)
nRBC: 0.2 % (ref 0.0–0.2)

## 2023-12-06 LAB — CMP (CANCER CENTER ONLY)
ALT: 33 U/L (ref 0–44)
AST: 28 U/L (ref 15–41)
Albumin: 3.3 g/dL — ABNORMAL LOW (ref 3.5–5.0)
Alkaline Phosphatase: 85 U/L (ref 38–126)
Anion gap: 10 (ref 5–15)
BUN: 43 mg/dL — ABNORMAL HIGH (ref 8–23)
CO2: 30 mmol/L (ref 22–32)
Calcium: 9.8 mg/dL (ref 8.9–10.3)
Chloride: 101 mmol/L (ref 98–111)
Creatinine: 1.34 mg/dL — ABNORMAL HIGH (ref 0.61–1.24)
GFR, Estimated: 57 mL/min — ABNORMAL LOW (ref >60)
Glucose, Bld: 122 mg/dL — ABNORMAL HIGH (ref 70–99)
Potassium: 4.2 mmol/L (ref 3.5–5.1)
Sodium: 141 mmol/L (ref 135–145)
Total Bilirubin: 1.6 mg/dL — ABNORMAL HIGH (ref 0.0–1.2)
Total Protein: 5 g/dL — ABNORMAL LOW (ref 6.5–8.1)

## 2023-12-06 LAB — SAMPLE TO BLOOD BANK

## 2023-12-06 LAB — URIC ACID: Uric Acid, Serum: 6.3 mg/dL (ref 3.7–8.6)

## 2023-12-06 LAB — LACTATE DEHYDROGENASE: LDH: 259 U/L — ABNORMAL HIGH (ref 98–192)

## 2023-12-06 NOTE — Progress Notes (Signed)
Rapid Infusion Rituximab Pharmacist Evaluation  Eric [name] is a 73 y.o. male being treated with rituximab for DLBCL . This patient may be considered for RIR.   A pharmacist has verified the patient tolerated rituximab infusions per the [facility] standard infusion protocol without grade 3-4 infusion reactions. The treatment plan will be updated to reflect RIR if the patient qualifies per the checklist below:   Age > 94 years old Yes   Clinically significant cardiovascular disease No   Circulating lymphocyte count < 5000/uL prior to cycle two Yes  Lab Results  Component Value Date   LYMPHSABS 0.6 (L) 12/06/2023    Prior documented grade 3-4 infusion reaction to rituximab No   Prior documented grade 1-2 infusion reaction to rituximab (If YES, Pharmacist will confirm with Physician if patient is still a candidate for RIR) No   Previous rituximab infusion within the past 6 months No   Treatment Plan updated orders to reflect RIR Yes    Eric [name] does meet the criteria for Rapid Infusion Rituximab. This patient is going to be switched to rapid infusion rituximab.    Sharen Hones, PharmD, BCPS Clinical Pharmacist   12/06/23 12:02 PM

## 2023-12-07 ENCOUNTER — Inpatient Hospital Stay: Payer: Medicare Other

## 2023-12-07 ENCOUNTER — Encounter: Payer: Self-pay | Admitting: Cardiology

## 2023-12-07 ENCOUNTER — Ambulatory Visit: Payer: Medicare Other | Attending: Cardiology | Admitting: Cardiology

## 2023-12-07 VITALS — BP 132/58 | HR 63 | Ht 72.0 in | Wt 291.2 lb

## 2023-12-07 DIAGNOSIS — I10 Essential (primary) hypertension: Secondary | ICD-10-CM | POA: Diagnosis not present

## 2023-12-07 DIAGNOSIS — C8330 Diffuse large B-cell lymphoma, unspecified site: Secondary | ICD-10-CM | POA: Diagnosis not present

## 2023-12-07 DIAGNOSIS — E782 Mixed hyperlipidemia: Secondary | ICD-10-CM | POA: Diagnosis not present

## 2023-12-07 DIAGNOSIS — I420 Dilated cardiomyopathy: Secondary | ICD-10-CM | POA: Diagnosis not present

## 2023-12-07 DIAGNOSIS — I700 Atherosclerosis of aorta: Secondary | ICD-10-CM | POA: Diagnosis not present

## 2023-12-07 DIAGNOSIS — Z86711 Personal history of pulmonary embolism: Secondary | ICD-10-CM | POA: Diagnosis not present

## 2023-12-07 DIAGNOSIS — Z86718 Personal history of other venous thrombosis and embolism: Secondary | ICD-10-CM | POA: Diagnosis not present

## 2023-12-07 NOTE — Patient Instructions (Signed)
 Medication Instructions:  No changes *If you need a refill on your cardiac medications before your next appointment, please call your pharmacy*   Lab Work: Please have the Lipid Lab drawn when you have your other labs taken at the cancer center  If you have labs (blood work) drawn today and your tests are completely normal, you will receive your results only by: MyChart Message (if you have MyChart) OR A paper copy in the mail If you have any lab test that is abnormal or we need to change your treatment, we will call you to review the results.   Testing/Procedures: None ordered   Follow-Up: At [facility], you and your health needs are our priority.  As part of our continuing mission to provide you with exceptional heart care, we have created designated Provider Care Teams.  These Care Teams include your primary Cardiologist (physician) and Advanced Practice Providers (APPs -  Physician Assistants and Nurse Practitioners) who all work together to provide you with the care you need, when you need it.  We recommend signing up for the patient portal called MyChart.  Sign up information is provided on this After Visit Summary.  MyChart is used to connect with patients for Virtual Visits (Telemedicine).  Patients are able to view lab/test results, encounter notes, upcoming appointments, etc.  Non-urgent messages can be sent to your provider as well.   To learn more about what you can do with MyChart, go to [email or web address].    Your next appointment:   6 month(s)  Provider:   Tylene Lunch, NP

## 2023-12-07 NOTE — Progress Notes (Signed)
 Cardiology Office Note:  .   Date:  12/07/2023  ID:  Eric [name], DOB 09-03-1951, MRN [id number] PCP: Gasper Nancyann BRAVO, MD  [facility] Providers Cardiologist:  None    History of Present Illness: .   Eric [name] is a 73 y.o. male with a past medical history of type 2 diabetes, chronic lower extremity edema, diffuse large B-cell lymphoma, dilated cardiomyopathy, morbid obesity, history of bilateral pulmonary embolism and DVT, and  tobacco abuse, who is here today to follow-up on his cardiomyopathy.   Hospitalized in November 2020 for pulmonary embolism started on Xarelto .  Reports that he had resolved and shortly after developed severe sharp left-sided pleuritic chest pain as well as shortness of breath.  In the hospital requiring oxygen  of 4 L via nasal cannula.  CT was positive for bilateral pulmonary embolism.  Lower extremity venous Doppler revealed nonocclusive thrombus in the right popliteal vein, right posterior tibial vein and right peroneal vein.  Echocardiogram completed in 08/07/2020 revealed LVEF 50-55% repeat in 09/2020 revealed an LVEF of 50%.  Refractory diffuse large B-cell lymphoma completed 6 months of chemotherapy.  Was referred to [facility] for continued treatment.  Underwent additional therapy of CAR-T cells, preceded by flow Dar[place]ne/cyclophosphamide  followed by lymphodepletion.  The enhancement in 18 days in the hospital for Streptococcus bacteremia.  Was found to be in complete remission.  Recommend PET/CT in 6 months posttreatment showed complete response to therapy.   He was last seen in clinic 10/12/2021 by Dr. Gollan.  At that he had been doing well.  There were no medication changes that were made and no further testing that was ordered.  He returns to clinic today stating from the cardiac perspective he has been doing fairly well.  He occasionally has some shortness of breath that he feels is related to his anemia.  He states that his oncologist and scheduled for him to  have an echocardiogram completed as he had previously been in remission for his large B-cell lymphoma but is now undergoing treatment again.  Denies any recent hospitalizations or visits to the emergency department.  ROS: 10 point review of system has been reviewed and considered negative with exception was been listed in HPI  Studies Reviewed: SABRA   EKG Interpretation Date/Time:  Wednesday December 07 2023 09:31:51 EST Ventricular Rate:  63 PR Interval:  152 QRS Duration:  90 QT Interval:  376 QTC Calculation: 384 R Axis:   -24  Text Interpretation: Normal sinus rhythm Minimal voltage criteria for LVH, may be normal variant ( R in aVL ) When compared with ECG of 18-Sep-2019 15:40, PREVIOUS ECG IS PRESENT Confirmed by Gerard Frederick ([id number]) on 12/07/2023 9:35:07 AM    2D echo 11/30/2023 1. Left ventricular ejection fraction, by estimation, is 60 to 65%. The  left ventricle has normal function. The left ventricle has no regional  wall motion abnormalities. The left ventricular internal cavity size was  moderately dilated. Left ventricular  diastolic parameters are consistent with Grade I diastolic dysfunction  (impaired relaxation).   2. Right ventricular systolic function is normal. The right ventricular  size is normal. Tricuspid regurgitation signal is inadequate for assessing  PA pressure.   3. Left atrial size was mildly dilated.   4. The mitral valve is normal in structure. Mild mitral valve  regurgitation. No evidence of mitral stenosis.   5. The aortic valve is normal in structure. Aortic valve regurgitation is  not visualized. Aortic valve sclerosis is present, with no  evidence of  aortic valve stenosis.   6. There is mild dilatation of the ascending aorta, measuring 41 mm.   7. The inferior vena cava is normal in size with greater than 50%  respiratory variability, suggesting right atrial pressure of 3 mmHg.  Risk Assessment/Calculations:             Physical Exam:   VS:   BP (!) 132/58   Pulse 63   Ht 6' (1.829 m)   Wt 291 lb 3.2 oz (132.1 kg)   SpO2 95%   BMI 39.49 kg/m    Wt Readings from Last 3 Encounters:  12/07/23 291 lb 3.2 oz (132.1 kg)  11/29/23 299 lb 5.8 oz (135.8 kg)  11/21/23 296 lb 12.8 oz (134.6 kg)    GEN: Well nourished, well developed in no acute distress NECK: No JVD; No carotid bruits CARDIAC: RRR, no murmurs, rubs, gallops RESPIRATORY:  Clear to auscultation without rales, wheezing or rhonchi  ABDOMEN: Soft, non-tender, non-distended EXTREMITIES:  No edema; No deformity   ASSESSMENT AND PLAN: .   Dilated cardiomyopathy on echocardiogram in 2021 with an EF of 50%.  Repeat echocardiogram recently completed revealed LVEF of 60 to 65%, no RWMA, she DD, and mild MR. Previously had an intolerance of losartan .  He is asymptomatic and stable.  Previously had been on carvedilol  but is unsure who discontinued or if he had discontinued that medicine on his own.  At this time we deferred any additional medications as he just restarted with treatment for lymphoma.  Aortic atherosclerosis seen on CT scan in 2015 that was mild.  Mild disease on CT scan in 2020.  Continues to be asymptomatic.  EKG today reveals sinus rhythm with a rate of 63 with LVH and a left axis deviation with no acute changes noted.  No further testing at this time.  Diffuse large B-cell lymphoma where he continues to be followed by hematology and oncology.  He has a back undergoing infusions every 3 weeks and will need repeat echocardiogram in the middle of treatment and posttreatment for reevaluation of LVEF.  Hypertension with blood pressure 132/58 with blood pressure has been stable.  He is no longer on any current antihypertensive medications.  Has been encouraged to continue to monitor blood pressures 1 to 2 hours postmedication administration.  Mixed hyperlipidemia with an LDL of 90 would recommend with his aortic atherosclerosis the goal be 70 or less.  He has not had a  lipid panel completed since 12 of 22.  Lipid panel versus direct LDL ordered with his next blood draw at the oncology center. Will evaluate need for therapy at that time.  History of PE and DVT ordered previously been on Xarelto .  Is no longer on anticoagulant therapy.    Dispo: Patient return to clinic to see primary cardiologist Dr. Gollan or APP in 6 months or sooner if needed for reevaluation of symptoms.  Signed, Tamiko Leopard, NP

## 2023-12-12 ENCOUNTER — Encounter: Payer: Self-pay | Admitting: Internal Medicine

## 2023-12-15 ENCOUNTER — Encounter: Payer: Self-pay | Admitting: Internal Medicine

## 2023-12-16 ENCOUNTER — Other Ambulatory Visit: Payer: Self-pay | Admitting: *Deleted

## 2023-12-16 ENCOUNTER — Encounter: Payer: Self-pay | Admitting: Internal Medicine

## 2023-12-16 DIAGNOSIS — C8330 Diffuse large B-cell lymphoma, unspecified site: Secondary | ICD-10-CM

## 2023-12-16 NOTE — Progress Notes (Signed)
[facility] is requesting local port insertion. Order placed in epic.

## 2023-12-19 ENCOUNTER — Other Ambulatory Visit: Payer: Self-pay | Admitting: Internal Medicine

## 2023-12-19 NOTE — Progress Notes (Signed)
 Pt going for leukapheresis on 2/27;   Plan to start # 2 cycle Rit-Pola on 3/3-   GB

## 2023-12-20 ENCOUNTER — Inpatient Hospital Stay: Payer: Medicare Other

## 2023-12-20 ENCOUNTER — Ambulatory Visit: Payer: Medicare Other

## 2023-12-20 ENCOUNTER — Inpatient Hospital Stay: Payer: Medicare Other | Admitting: Internal Medicine

## 2023-12-21 ENCOUNTER — Inpatient Hospital Stay: Payer: Medicare Other

## 2023-12-21 ENCOUNTER — Inpatient Hospital Stay (HOSPITAL_BASED_OUTPATIENT_CLINIC_OR_DEPARTMENT_OTHER): Payer: Medicare Other | Admitting: Hospice and Palliative Medicine

## 2023-12-21 ENCOUNTER — Telehealth: Payer: Self-pay | Admitting: *Deleted

## 2023-12-21 ENCOUNTER — Encounter: Payer: Self-pay | Admitting: Hospice and Palliative Medicine

## 2023-12-21 VITALS — BP 132/88 | HR 77 | Temp 98.4°F | Ht 72.0 in | Wt 288.9 lb

## 2023-12-21 DIAGNOSIS — C8330 Diffuse large B-cell lymphoma, unspecified site: Secondary | ICD-10-CM | POA: Diagnosis not present

## 2023-12-21 DIAGNOSIS — C83398 Diffuse large b-cell lymphoma of other extranodal and solid organ sites: Secondary | ICD-10-CM | POA: Diagnosis not present

## 2023-12-21 MED ORDER — GABAPENTIN 100 MG PO CAPS: 100.0000 mg | ORAL_CAPSULE | Freq: Three times a day (TID) | ORAL | 0 refills | Status: DC

## 2023-12-21 NOTE — Progress Notes (Signed)
 C/o rt pain pain x/the weekend, comes and goes, worse the past 36 hours, 10/10.

## 2023-12-21 NOTE — Telephone Encounter (Signed)
 He patient called and said he is having issues with right arm and over the 24 hours it has got worse and keeps him up at night. He would like to know what to do to help.

## 2023-12-21 NOTE — Telephone Encounter (Signed)
 Dr. Donneta Romberg said fr the pt see [facility]. Josh says 11:15 today for him and the pt was told it also and he will be here

## 2023-12-21 NOTE — Progress Notes (Signed)
 Symptom Management Clinic [facility] at [facility] [phone] [phone]  Patient Care Team: Malva Limes, MD as PCP - General (Family Medicine) Lemar Livings, Merrily Pew, MD (General Surgery) Earna Coder, MD as Consulting Physician (Internal Medicine)   NAME OF PATIENT: Eric [name]  [id number]  [birth year]   DATE OF VISIT: 12/21/23  REASON FOR CONSULT: Eric [name] is a 73 y.o. male with multiple medical problems including relapsed/refractory diffuse large B-cell lymphoma.  Patient status post CART therapy at [facility] November 2021.  Now on salvage chemotherapy.  INTERVAL HISTORY: Patient saw Dr. Donneta Romberg on 11/29/2023.  Patient is being evaluated for possible clinical trial at [facility].  Currently on POLA+ rituximab infusion-every 3 weeks.  Patient presents to [facility] today for evaluation of right arm pain.  Patient describes about a week of intermittent numbness and tingling in his right arm from his shoulder down to his fingertips.  Patient also describes at times feeling electricity sensation.  He has not identified any specific triggers.  Denies significant weakness.  No limitation in range of motion of his neck.  Denies swelling.  No rashes.  Denies any neurologic complaints. Denies recent fevers or illnesses. Denies any easy bleeding or bruising. Reports good appetite and denies weight loss. Denies chest pain. Denies any nausea, vomiting, constipation, or diarrhea. Denies urinary complaints. Patient offers no further specific complaints today.   PAST MEDICAL HISTORY: Past Medical History:  Diagnosis Date   Adopted person    Cancer [facility])    Colon polyps    Pancreatitis    Rectal bleeding 02/24/2014   Sepsis (HCC) 09/18/2019    PAST SURGICAL HISTORY:  Past Surgical History:  Procedure Laterality Date   CHOLECYSTECTOMY     COLONOSCOPY  02/28/14   IR IMAGING GUIDED PORT INSERTION  07/18/2019   IR REMOVAL TUN ACCESS W/ PORT W/O FL MOD  SED  05/29/2021   LYMPH NODE BIOPSY Left 03/28/2020   Procedure: EXCISION OF SUBMANDIBULAR  LYMPH NODE;  Surgeon: Bud Face, MD;  Location: [facility];  Service: ENT;  Laterality: Left;   POLYPECTOMY     colon polyp removed   TONSILLECTOMY      HEMATOLOGY/ONCOLOGY HISTORY:  Oncology History Overview Note  #June 29, 2019-CT- bulky bilateral neck adenopathy left more than right; September 2020-Diffuse B-cell lymphoma; ABC subtype; NEGATIVE FISH studies.  PET scan -bulky adenopathy involving neck /lymphadenopathy mediastinum /abdomen/pelvis inguinal lymph nodes; splenic involvement.  Question bone.  Stage IV [spleen involvement; no bone marrow biopsy].  BCL-2: Positive /BCL 6: Positive /MUM-1: Positive /CD10: Negative 'Ki-67: High proliferation index (90%) /CD30: Negative /Cyclin D1: Negative C-MYC: Equivocal (20% staining seen on limited and fragmented tissue) /EBER: Negative   # Ritux-Pred- 9/11; 9/18- R-COP [held adria- sec to EF 45-50%]  #May 2021-PET scan left neck recurrent disease; excisional biopsy [Dr. Vaught]-recurrent diffuse left B-cell lymphoma  #November 2020-bilateral PE; on xarelto  #J07/07/2020- 2021- Gem-CarboDex [II opinion [facility]; Dr. Noralee Stain; stem cell transplant eval]; no Rituxan-repeat CD20 negative.   #S/p 2 cycles-gem carbo Dex-June 18, 2020 PET scan-slight increase in SUV uptake of the left cervical lymph nodes; no distant metastatic disease.  Discontinued chemotherapy; refer to CAR-T;  OCT 12th 2021- [facility]- -New extensive cervical, supraclavicular, intrathoracic, axillary and abdominal and pelvic lymph nodes, worrisome for lymphoma involvement. New splenomegaly with new, diffuse uptake, now more intense than the liver, concerning for lymphoma involvement. S/p POLA-BENDA  # CAR-T/Axi-cel on 09/16/20 preceded by fludarabine/cyclophosphamide lymphodepletion.[[facility]; Dr.Grover]; complicated by CRS-G-3 [  ICU]; dec 27th-PET CR.  # JAN 2025- CT/PET scan-widespread  lymphadenopathy; splenic enlargement concerning for recurrent lymphoma  # JAN 16th, 2025- LN Bx-1. Lymph node, biopsy, Right supraclavicular :      -  FINDINGS CONSISTENT WITH RECURRENT/PERSISTENT DIFFUSE LARGE B-CELL LYMPHOMA.      NOTE: THE THIN NEEDLE CORE BIOPSIES CONSISTS OF SHEETS OF MODERATE TO LARGE      SIZED CD20 POSITIVE B-CELL THAT COEXPRESS MUM1, BCL-2 AND BCL6 AND ARE NEGATIVE      FOR CD10 CONSISTENT WITH A POST GERMINAL CENTER/ACTIVATED IMMUNOPHENOTYPE.      CD3/CD5 HIGHLIGHTS SMALL BACKGROUND T CELLS.  CD23 IS NEGATIVE FOR A FOLLICULAR      DENDRITIC CELL MESHWORK.  THE PROLIFERATION RATE IS HIGH AT APPROXIMATELY 75% BY      KI-67.  FLOW CYTOMETRY IDENTIFIES STRONGLY KAPPA RESTRICTED B CELLS WITH A      NONSPECIFIC IMMUNOPHENOTYPE (CD5/CD10 NEGATIVE) WITH APPARENT LARGE SIZE BY      FORWARD SCATTER   # JAN 28th, 2025- Pola-Rituxan [bridging]   # Sep 2020- Borderline low EF/Left ventricular hypokinesis [Dr.Gollan]-s October 9th stress test-nonischemic;   # DIAGNOSIS: DLBCL     Lymphoma of lymph nodes (HCC)  07/03/2019 Initial Diagnosis   Lymphoma of lymph nodes (HCC)   07/13/2019 - 12/07/2019 Chemotherapy   The patient had palonosetron (ALOXI) injection 0.25 mg, 0.25 mg, Intravenous,  Once, 7 of 7 cycles Administration: 0.25 mg (07/20/2019), 0.25 mg (08/13/2019), 0.25 mg (11/13/2019), 0.25 mg (09/03/2019), 0.25 mg (10/02/2019), 0.25 mg (10/23/2019), 0.25 mg (12/04/2019) pegfilgrastim (NEULASTA ONPRO KIT) injection 6 mg, 6 mg, Subcutaneous, Once, 2 of 2 cycles Administration: 6 mg (10/04/2019), 6 mg (10/25/2019) pegfilgrastim-cbqv (UDENYCA) injection 6 mg, 6 mg, Subcutaneous, Once, 5 of 5 cycles Administration: 6 mg (07/23/2019), 6 mg (08/16/2019), 6 mg (09/06/2019), 6 mg (11/16/2019), 6 mg (12/07/2019) vinCRIStine (ONCOVIN) 2 mg in sodium chloride 0.9 % 50 mL chemo infusion, 2 mg, Intravenous,  Once, 7 of 7 cycles Administration: 2 mg (07/20/2019), 2 mg (08/13/2019), 2 mg (11/13/2019),  2 mg (09/03/2019), 2 mg (10/02/2019), 2 mg (10/23/2019), 2 mg (12/04/2019) cyclophosphamide (CYTOXAN) 1,900 mg in sodium chloride 0.9 % 250 mL chemo infusion, 750 mg/m2 = 1,900 mg, Intravenous,  Once, 7 of 7 cycles Administration: 1,900 mg (07/20/2019), 1,900 mg (08/13/2019), 2,000 mg (11/13/2019), 1,900 mg (09/03/2019), 2,000 mg (10/02/2019), 2,000 mg (10/23/2019), 2,000 mg (12/04/2019) etoposide (VEPESID) 250 mg in sodium chloride 0.9 % 1,000 mL chemo infusion, 100 mg/m2 = 250 mg, Intravenous,  Once, 6 of 6 cycles Dose modification: 50 mg/m2 (original dose 100 mg/m2, Cycle 3, Reason: Provider Judgment) Administration: 130 mg (08/14/2019), 130 mg (08/15/2019), 130 mg (09/03/2019), 130 mg (09/04/2019), 130 mg (09/05/2019), 130 mg (10/03/2019), 130 mg (10/04/2019), 130 mg (10/24/2019), 130 mg (10/25/2019), 130 mg (11/13/2019), 130 mg (11/14/2019), 130 mg (11/15/2019), 130 mg (10/02/2019), 130 mg (10/23/2019), 130 mg (12/04/2019), 130 mg (12/05/2019), 130 mg (12/06/2019) dexamethasone (DECADRON) 12 mg in sodium chloride 0.9 % 145 mL IVPB, , Intravenous,  Once, 8 of 8 cycles Administration:  (07/13/2019),  (07/20/2019),  (08/13/2019),  (11/13/2019),  (09/03/2019),  (10/02/2019),  (10/23/2019),  (12/04/2019) riTUXimab-pvvr (RUXIENCE) 900 mg in sodium chloride 0.9 % 250 mL (2.6471 mg/mL) infusion, 375 mg/m2 = 900 mg (100 % of original dose 375 mg/m2), Intravenous,  Once, 1 of 1 cycle Dose modification: 375 mg/m2 (original dose 375 mg/m2, Cycle 1) Administration: 900 mg (07/13/2019)  for chemotherapy treatment.    05/09/2020 - 06/06/2020 Chemotherapy   The patient had palonosetron (ALOXI)  injection 0.25 mg, 0.25 mg, Intravenous,  Once, 2 of 4 cycles Administration: 0.25 mg (05/09/2020), 0.25 mg (05/30/2020) CARBOplatin (PARAPLATIN) 750 mg in sodium chloride 0.9 % 250 mL chemo infusion, 750 mg (100 % of original dose 750 mg), Intravenous,  Once, 2 of 4 cycles Dose modification:   (original dose 750 mg, Cycle 1, Reason: Provider Judgment,  Comment: labs from care every where. ) Administration: 750 mg (05/09/2020), 750 mg (05/30/2020) gemcitabine (GEMZAR) 2,500 mg in sodium chloride 0.9 % 250 mL chemo infusion, 2,584 mg, Intravenous,  Once, 2 of 4 cycles Administration: 2,500 mg (05/09/2020), 2,500 mg (05/16/2020), 2,500 mg (05/30/2020), 2,500 mg (06/06/2020) fosaprepitant (EMEND) 150 mg in sodium chloride 0.9 % 145 mL IVPB, 150 mg, Intravenous,  Once, 2 of 4 cycles Administration: 150 mg (05/09/2020), 150 mg (05/30/2020)  for chemotherapy treatment.    08/27/2020 - 08/27/2020 Chemotherapy   Patient is on Treatment Plan : NON-HODGKINS LYMPHOMA RELAPSED/ REFRACTORY DLBCL Polatuzumab + Bendamustine + Rituximab q21d x 6 cycles     11/13/2020 Cancer Staging   Staging form: Hodgkin and Non-Hodgkin Lymphoma, AJCC 8th Edition - Clinical: Stage IV (Diffuse large B-cell lymphoma) - Signed by Earna Coder, MD on 11/13/2020   11/29/2023 -  Chemotherapy   Patient is on Treatment Plan : NON-HODGKINS LYMPHOMA RELAPSED/ REFRACTORY DLBCL Polatuzumab D1 ++ Rituximab D1 q21d x 6 cycles       ALLERGIES:  is allergic to allopurinol, azithromycin, and losartan potassium.  MEDICATIONS:  Current Outpatient Medications  Medication Sig Dispense Refill   febuxostat (ULORIC) 40 MG tablet Take 40 mg a day x1 week, and then increase to 80 mg a day. 60 tablet 1   Multiple Vitamin (MULTI-VITAMIN) tablet Take 1 tablet by mouth daily.     ondansetron (ZOFRAN) 8 MG tablet One pill every 8 hours as needed for nausea/vomitting. (Patient not taking: Reported on 11/29/2023) 40 tablet 1   predniSONE (DELTASONE) 50 MG tablet Take 2 tablets once day x 5 days.Take in AM; with FOOD. 10 tablet 5   prochlorperazine (COMPAZINE) 10 MG tablet Take 1 tablet (10 mg total) by mouth every 6 (six) hours as needed for nausea or vomiting. (Patient not taking: Reported on 11/29/2023) 40 tablet 1   Sulfamethoxazole-Trimethoprim (BACTRIM PO) Take 1 tablet by mouth every Monday,  Wednesday, and Friday.     valACYclovir HCl (VALTREX PO) Take 1 tablet by mouth 2 (two) times daily.     No current facility-administered medications for this visit.    VITAL SIGNS: BP (!) 160/124 (BP Location: Left Arm, Patient Position: Sitting, Cuff Size: Large)   Pulse 77   Temp 98.4 F (36.9 C) (Oral)   Ht 6' (1.829 m)   Wt 288 lb 14.4 oz (131 kg)   SpO2 98%   BMI 39.18 kg/m  Filed Weights   12/21/23 1136  Weight: 288 lb 14.4 oz (131 kg)    Estimated body mass index is 39.18 kg/m as calculated from the following:   Height as of this encounter: 6' (1.829 m).   Weight as of this encounter: 288 lb 14.4 oz (131 kg).  LABS: CBC:    Component Value Date/Time   WBC 8.5 12/06/2023 0924   WBC 5.0 08/11/2021 0905   HGB 9.1 (L) 12/06/2023 0924   HGB 8.1 (L) 10/24/2023 1254   HCT 29.0 (L) 12/06/2023 0924   HCT 25.5 (L) 10/24/2023 1254   PLT 105 (L) 12/06/2023 0924   PLT 110 (L) 10/24/2023 1254   MCV 104.7 (  H) 12/06/2023 0924   MCV 100 (H) 10/24/2023 1254   MCV 91 03/01/2014 0426   NEUTROABS 6.9 12/06/2023 0924   NEUTROABS 2.8 10/24/2023 1254   NEUTROABS 7.6 (H) 03/01/2014 0426   LYMPHSABS 0.6 (L) 12/06/2023 0924   LYMPHSABS 0.6 (L) 10/24/2023 1254   LYMPHSABS 1.9 03/01/2014 0426   MONOABS 0.4 12/06/2023 0924   MONOABS 0.6 03/01/2014 0426   EOSABS 0.0 12/06/2023 0924   EOSABS 0.1 10/24/2023 1254   EOSABS 0.2 03/01/2014 0426   BASOSABS 0.0 12/06/2023 0924   BASOSABS 0.0 10/24/2023 1254   BASOSABS 0.1 03/01/2014 0426   Comprehensive Metabolic Panel:    Component Value Date/Time   NA 141 12/06/2023 0924   NA 140 02/24/2014 1106   K 4.2 12/06/2023 0924   K 3.9 02/24/2014 1106   CL 101 12/06/2023 0924   CL 108 (H) 02/24/2014 1106   CO2 30 12/06/2023 0924   CO2 25 02/24/2014 1106   BUN 43 (H) 12/06/2023 0924   BUN 21 (H) 02/24/2014 1106   CREATININE 1.34 (H) 12/06/2023 0924   CREATININE 0.89 02/24/2014 1106   GLUCOSE 122 (H) 12/06/2023 0924   GLUCOSE 106 (H)  02/24/2014 1106   CALCIUM 9.8 12/06/2023 0924   CALCIUM 8.6 02/24/2014 1106   AST 28 12/06/2023 0924   ALT 33 12/06/2023 0924   ALT 39 02/24/2014 1106   ALKPHOS 85 12/06/2023 0924   ALKPHOS 98 02/24/2014 1106   BILITOT 1.6 (H) 12/06/2023 0924   PROT 5.0 (L) 12/06/2023 0924   PROT 7.2 02/24/2014 1106   ALBUMIN 3.3 (L) 12/06/2023 0924   ALBUMIN 3.7 02/24/2014 1106    RADIOGRAPHIC STUDIES: ECHOCARDIOGRAM COMPLETE Result Date: 12/01/2023    ECHOCARDIOGRAM REPORT   Patient Name:   KAENAN JAKE Date of Exam: 11/30/2023 Medical Rec #:  [id number]   Height:       72.0 in Accession #:    [id number]  Weight:       299.4 lb Date of Birth:  10/29/1951   BSA:          2.529 m Patient Age:    72 years    BP:           118/62 mmHg Patient Gender: M           HR:           90 bpm. Exam Location:  [facility] Procedure: 2D Echo, 3D Echo, Cardiac Doppler and Color Doppler Indications:    I42.0 Dilated cardiomyopathy  History:        Patient has prior history of Echocardiogram examinations, most                 recent 09/19/2019. Cardiomyopathy; Risk Factors:Current Smoker.                 H/o pulmonary embolism.  Sonographer:    Quentin Ore RDMS, RVT, RDCS Referring Phys: [id number] Antonieta Iba IMPRESSIONS  1. Left ventricular ejection fraction, by estimation, is 60 to 65%. The left ventricle has normal function. The left ventricle has no regional wall motion abnormalities. The left ventricular internal cavity size was moderately dilated. Left ventricular diastolic parameters are consistent with Grade I diastolic dysfunction (impaired relaxation).  2. Right ventricular systolic function is normal. The right ventricular size is normal. Tricuspid regurgitation signal is inadequate for assessing PA pressure.  3. Left atrial size was mildly dilated.  4. The mitral valve is normal in structure. Mild mitral valve regurgitation. No evidence of  mitral stenosis.  5. The aortic valve is normal in structure. Aortic valve  regurgitation is not visualized. Aortic valve sclerosis is present, with no evidence of aortic valve stenosis.  6. There is mild dilatation of the ascending aorta, measuring 41 mm.  7. The inferior vena cava is normal in size with greater than 50% respiratory variability, suggesting right atrial pressure of 3 mmHg. FINDINGS  Left Ventricle: Left ventricular ejection fraction, by estimation, is 60 to 65%. The left ventricle has normal function. The left ventricle has no regional wall motion abnormalities. The left ventricular internal cavity size was moderately dilated. There is no left ventricular hypertrophy. Left ventricular diastolic parameters are consistent with Grade I diastolic dysfunction (impaired relaxation). Right Ventricle: The right ventricular size is normal. No increase in right ventricular wall thickness. Right ventricular systolic function is normal. Tricuspid regurgitation signal is inadequate for assessing PA pressure. Left Atrium: Left atrial size was mildly dilated. Right Atrium: Right atrial size was normal in size. Pericardium: There is no evidence of pericardial effusion. Mitral Valve: The mitral valve is normal in structure. Mild mitral valve regurgitation. No evidence of mitral valve stenosis. Tricuspid Valve: The tricuspid valve is normal in structure. Tricuspid valve regurgitation is not demonstrated. No evidence of tricuspid stenosis. Aortic Valve: The aortic valve is normal in structure. Aortic valve regurgitation is not visualized. Aortic valve sclerosis is present, with no evidence of aortic valve stenosis. Aortic valve mean gradient measures 5.0 mmHg. Aortic valve peak gradient measures 9.6 mmHg. Aortic valve area, by VTI measures 4.94 cm. Pulmonic Valve: The pulmonic valve was normal in structure. Pulmonic valve regurgitation is mild. No evidence of pulmonic stenosis. Aorta: The aortic root is normal in size and structure. There is mild dilatation of the ascending aorta, measuring  41 mm. Venous: The inferior vena cava is normal in size with greater than 50% respiratory variability, suggesting right atrial pressure of 3 mmHg. IAS/Shunts: No atrial level shunt detected by color flow Doppler.  LEFT VENTRICLE PLAX 2D LVIDd:         6.40 cm      Diastology LVIDs:         4.70 cm      LV e' medial:    9.03 cm/s LV PW:         1.10 cm      LV E/e' medial:  9.5 LV IVS:        1.00 cm      LV e' lateral:   12.90 cm/s LVOT diam:     2.70 cm      LV E/e' lateral: 6.7 LV SV:         136 LV SV Index:   54 LVOT Area:     5.73 cm                              3D Volume EF: LV Volumes (MOD)            3D EF:        56 % LV vol d, MOD A2C: 123.0 ml LV EDV:       259 ml LV vol d, MOD A4C: 189.0 ml LV ESV:       114 ml LV vol s, MOD A2C: 58.3 ml  LV SV:        146 ml LV vol s, MOD A4C: 89.9 ml LV SV MOD A2C:     64.7 ml LV  SV MOD A4C:     189.0 ml LV SV MOD BP:      78.0 ml RIGHT VENTRICLE             IVC RV Basal diam:  4.90 cm     IVC diam: 2.00 cm RV Mid diam:    4.40 cm RV S prime:     20.00 cm/s TAPSE (M-mode): 2.8 cm LEFT ATRIUM             Index        RIGHT ATRIUM           Index LA diam:        5.30 cm 2.10 cm/m   RA Area:     22.60 cm LA Vol (A2C):   71.2 ml 28.15 ml/m  RA Volume:   67.60 ml  26.73 ml/m LA Vol (A4C):   63.9 ml 25.27 ml/m LA Biplane Vol: 68.8 ml 27.21 ml/m  AORTIC VALVE                     PULMONIC VALVE AV Area (Vmax):    4.65 cm      PV Vmax:       1.56 m/s AV Area (Vmean):   4.70 cm      PV Peak grad:  9.8 mmHg AV Area (VTI):     4.94 cm AV Vmax:           155.00 cm/s AV Vmean:          106.000 cm/s AV VTI:            0.276 m AV Peak Grad:      9.6 mmHg AV Mean Grad:      5.0 mmHg LVOT Vmax:         126.00 cm/s LVOT Vmean:        87.000 cm/s LVOT VTI:          0.238 m LVOT/AV VTI ratio: 0.86  AORTA Ao Root diam: 4.40 cm Ao Asc diam:  4.10 cm Ao Arch diam: 3.1 cm MITRAL VALVE MV Area (PHT): 3.72 cm     SHUNTS MV Decel Time: 204 msec     Systemic VTI:  0.24 m MV E velocity:  85.90 cm/s   Systemic Diam: 2.70 cm MV A velocity: 130.00 cm/s MV E/A ratio:  0.66 Julien Nordmann MD Electronically signed by Julien Nordmann MD Signature Date/Time: 12/01/2023/3:19:11 PM    Final     PERFORMANCE STATUS (ECOG) : 1 - Symptomatic but completely ambulatory  Review of Systems Unless otherwise noted, a complete review of systems is negative.  Physical Exam General: NAD Cardiovascular: regular rate and rhythm Pulmonary: clear ant fields Abdomen: soft, nontender, + bowel sounds GU: no suprapubic tenderness Extremities: no edema, no joint deformities, no lymphadenopathy, negative Tinel's Skin: no rashes Neurological: nonfocal  IMPRESSION/PLAN: DLBCL - POLA+ rituximab infusion-every 3 weeks.  Being evaluated for clinical trial at [facility].  RUE pain-symptoms sound neuropathic.  Suspect nerve impingement and will be concerned about C-spine.  I do not palpate any lymphadenopathy and do not suspect that this is related to his lymphoma.  Patient said that he is not interested in imaging workup given his ongoing care at [facility].  However, we discussed possible MRI of the C-spine if symptoms persist/worsen.  In interim, will trial low-dose gabapentin.  Case and plan discussed with Dr. Donneta Romberg   Patient expressed understanding and was in agreement with this plan. He also understands that  He can call clinic at any time with any questions, concerns, or complaints.   Thank you for allowing me to participate in the care of this very pleasant patient.   Time Total: 15 minutes  Visit consisted of counseling and education dealing with the complex and emotionally intense issues of symptom management in the setting of serious illness.Greater than 50%  of this time was spent counseling and coordinating care related to the above assessment and plan.  Signed by: Laurette Schimke, PhD, NP-C

## 2023-12-23 ENCOUNTER — Telehealth: Payer: Self-pay

## 2023-12-23 NOTE — Telephone Encounter (Signed)
 I called pt to tell him the date and time of his port placement that was suggested by an email to Dr. Leonard Schwartz, he was confused and thought because he was having some procedures/testing done next week at [facility] it would be too much for him at once. Dr. Leonard Schwartz called Laurin Coder at [facility], discussed concerned and decided to hold off on the port placement at this time. Pt notified and Marylu Lund with IR.

## 2023-12-24 ENCOUNTER — Emergency Department: Payer: Medicare Other

## 2023-12-24 ENCOUNTER — Other Ambulatory Visit: Payer: Self-pay

## 2023-12-24 ENCOUNTER — Emergency Department
Admission: EM | Admit: 2023-12-24 | Discharge: 2023-12-24 | Disposition: A | Discharge status: Home / Self Care | Payer: Medicare Other | Attending: Emergency Medicine | Admitting: Emergency Medicine

## 2023-12-24 DIAGNOSIS — M5412 Radiculopathy, cervical region: Secondary | ICD-10-CM | POA: Diagnosis not present

## 2023-12-24 DIAGNOSIS — Z8572 Personal history of non-Hodgkin lymphomas: Secondary | ICD-10-CM | POA: Insufficient documentation

## 2023-12-24 DIAGNOSIS — R200 Anesthesia of skin: Secondary | ICD-10-CM | POA: Diagnosis not present

## 2023-12-24 DIAGNOSIS — M5021 Other cervical disc displacement,  high cervical region: Secondary | ICD-10-CM | POA: Diagnosis not present

## 2023-12-24 DIAGNOSIS — M79601 Pain in right arm: Secondary | ICD-10-CM | POA: Diagnosis not present

## 2023-12-24 DIAGNOSIS — M47812 Spondylosis without myelopathy or radiculopathy, cervical region: Secondary | ICD-10-CM | POA: Diagnosis not present

## 2023-12-24 DIAGNOSIS — R202 Paresthesia of skin: Secondary | ICD-10-CM | POA: Diagnosis not present

## 2023-12-24 DIAGNOSIS — M4802 Spinal stenosis, cervical region: Secondary | ICD-10-CM | POA: Diagnosis not present

## 2023-12-24 DIAGNOSIS — M50221 Other cervical disc displacement at C4-C5 level: Secondary | ICD-10-CM | POA: Diagnosis not present

## 2023-12-24 DIAGNOSIS — M50222 Other cervical disc displacement at C5-C6 level: Secondary | ICD-10-CM | POA: Diagnosis not present

## 2023-12-24 LAB — CBC WITH DIFFERENTIAL/PLATELET
Abs Immature Granulocytes: 0.2 K/uL — ABNORMAL HIGH (ref 0.00–0.07)
Basophils Absolute: 0 K/uL (ref 0.0–0.1)
Basophils Relative: 1 %
Eosinophils Absolute: 0.1 K/uL (ref 0.0–0.5)
Eosinophils Relative: 1 %
HCT: 30.3 % — ABNORMAL LOW (ref 39.0–52.0)
Hemoglobin: 10 g/dL — ABNORMAL LOW (ref 13.0–17.0)
Immature Granulocytes: 5 %
Lymphocytes Relative: 10 %
Lymphs Abs: 0.4 K/uL — ABNORMAL LOW (ref 0.7–4.0)
MCH: 34 pg (ref 26.0–34.0)
MCHC: 33 g/dL (ref 30.0–36.0)
MCV: 103.1 fL — ABNORMAL HIGH (ref 80.0–100.0)
Monocytes Absolute: 0.3 K/uL (ref 0.1–1.0)
Monocytes Relative: 7 %
Neutro Abs: 3.2 K/uL (ref 1.7–7.7)
Neutrophils Relative %: 76 %
Platelets: 121 K/uL — ABNORMAL LOW (ref 150–400)
RBC: 2.94 MIL/uL — ABNORMAL LOW (ref 4.22–5.81)
RDW: 21.2 % — ABNORMAL HIGH (ref 11.5–15.5)
WBC: 4.2 K/uL (ref 4.0–10.5)
nRBC: 0 % (ref 0.0–0.2)

## 2023-12-24 LAB — COMPREHENSIVE METABOLIC PANEL WITH GFR
ALT: 23 U/L (ref 0–44)
AST: 21 U/L (ref 15–41)
Albumin: 4 g/dL (ref 3.5–5.0)
Alkaline Phosphatase: 57 U/L (ref 38–126)
Anion gap: 9 (ref 5–15)
BUN: 20 mg/dL (ref 8–23)
CO2: 26 mmol/L (ref 22–32)
Calcium: 8.9 mg/dL (ref 8.9–10.3)
Chloride: 106 mmol/L (ref 98–111)
Creatinine, Ser: 1.29 mg/dL — ABNORMAL HIGH (ref 0.61–1.24)
GFR, Estimated: 60 mL/min — ABNORMAL LOW
Glucose, Bld: 94 mg/dL (ref 70–99)
Potassium: 4.4 mmol/L (ref 3.5–5.1)
Sodium: 141 mmol/L (ref 135–145)
Total Bilirubin: 1.1 mg/dL (ref 0.0–1.2)
Total Protein: 5.8 g/dL — ABNORMAL LOW (ref 6.5–8.1)

## 2023-12-24 MED ORDER — IOHEXOL 350 MG/ML SOLN
75.0000 mL | Freq: Once | INTRAVENOUS | Status: AC | PRN
  Administered 2023-12-24: 75 mL via INTRAVENOUS

## 2023-12-24 MED ORDER — GADOBUTROL 1 MMOL/ML IV SOLN
10.0000 mL | Freq: Once | INTRAVENOUS | Status: AC | PRN
  Administered 2023-12-24: 10 mL via INTRAVENOUS

## 2023-12-24 NOTE — ED Provider Notes (Signed)
-----------------------------------------   7:29 PM on 12/24/2023 -----------------------------------------  Blood pressure 137/83, pulse 78, temperature 98.5 F (36.9 C), temperature source Oral, resp. rate 16, height 6' (1.829 m), weight 130.6 kg, SpO2 100%.  Assuming care from Dr. Fuller Plan.  In short, Eric [name] is a 73 y.o. male with a chief complaint of Arm Pain .  Refer to the original H&P for additional details.  The current plan of care is to order MRI brain and cervical spine with and without contrast to rule out mass given lymphoma hx.   MRI of brain and cervical spine results updated to patient.  Encourage follow-up with neurosurgery for further evaluation.  Patient is in stable condition for discharge home.        Romeo Apple, Salia Cangemi A, PA-C 12/25/23 0016    Concha Se, MD 12/25/23 [phone]

## 2023-12-24 NOTE — Discharge Instructions (Addendum)
 You were evaluated in the ED. your MRI of the cervical spine shows severe narrowing of the vertebrae.  Please continue to go to your scheduled appointments.  Take gabapentin in the interim and he can follow-up with neurosurgery Dr. Ernestine Mcmurray for further evaluation.

## 2023-12-24 NOTE — ED Triage Notes (Signed)
 Pt c/o right arm pain for about 2 weeks now with tingling in his finger tips. Pt is currently on gabapentin but with no relief.

## 2023-12-24 NOTE — ED Provider Notes (Signed)
 [facility] Provider Note    Event Date/Time   First MD Initiated Contact with Patient 12/24/23 1617     (approximate)   History   Arm Pain   HPI  Eric [name] is a 73 y.o. male with history of diffuse large B-cell lymphoma who is coming in with 2 weeks of tingling in his fingertips of his right arm.  Patient reports intermittent tingling, pain to his right arm.  He denies it feeling any colder but the wife states that she sometimes noticed that the arm seems to be colder.  He denies any chest pain, shortness of breath but he does report having history of DVTs and he is not currently on anticoagulation and so he was worried it could be a blood clot.  He denies any significant headaches or neck pain.  He does report occasionally some pain in the upper arm as well.   Physical Exam   Triage Vital Signs: ED Triage Vitals  Encounter Vitals Group     BP 12/24/23 1530 137/83     Systolic BP Percentile --      Diastolic BP Percentile --      Pulse Rate 12/24/23 1530 78     Resp 12/24/23 1530 16     Temp 12/24/23 1530 98.5 F (36.9 C)     Temp Source 12/24/23 1530 Oral     SpO2 12/24/23 1530 100 %     Weight 12/24/23 1531 288 lb (130.6 kg)     Height 12/24/23 1531 6' (1.829 m)     Head Circumference --      Peak Flow --      Pain Score 12/24/23 1531 5     Pain Loc --      Pain Education --      Exclude from Growth Chart --     Most recent vital signs: Vitals:   12/24/23 1530  BP: 137/83  Pulse: 78  Resp: 16  Temp: 98.5 F (36.9 C)  SpO2: 100%     General: Awake, no distress.  CV:  Good peripheral perfusion.  Resp:  Normal effort.  Abd:  No distention.  Other: Very slightly paler noted in the right arm. Not significantly cold.  Sensation intact currently.  Radial pulse was felt.   ED Results / Procedures / Treatments   Labs (all labs ordered are listed, but only abnormal results are displayed) Labs Reviewed  CBC WITH  DIFFERENTIAL/PLATELET  COMPREHENSIVE METABOLIC PANEL      RADIOLOGY I have reviewed the CT head personally interpreted no evidence of intracranial hemorrhage  PROCEDURES:  Critical Care performed: No  Procedures   MEDICATIONS ORDERED IN ED: Medications  iohexol (OMNIPAQUE) 350 MG/ML injection 75 mL (75 mLs Intravenous Contrast Given 12/24/23 1817)     IMPRESSION / MDM / ASSESSMENT AND PLAN / ED COURSE  I reviewed the triage vital signs and the nursing notes.   Patient's presentation is most consistent with acute presentation with potential threat to life or bodily function.   Patient comes in with some pain and tingling to the right arm.  I noticed a slight discoloration therefore I am more concerned about potential venous or arterial issues.  Will get DVT evaluate for the veins but also an arterial CT to rule out any type of aneurysm, clots that could be causing this intermittent issue such as subclavian steal syndrome.  Will get CT head to make sure no intracranial process that would be causing intermittent  numbness, tingling such as a mass as well as CT cervical to make sure no large compression deformity although these seem less likely.  CMP shows creatinine of 1.29.  CBC shows stable hemoglobin no white count elevation.  Ultrasound DVT is negative  Patient will be handed off to oncoming team pending CT imaging    FINAL CLINICAL IMPRESSION(S) / ED DIAGNOSES   Final diagnoses:  Right arm pain     Rx / DC Orders   ED Discharge Orders     None        Note:  This document was prepared using Dragon voice recognition software and may include unintentional dictation errors.   Concha Se, MD 12/24/23 [phone]

## 2023-12-29 ENCOUNTER — Ambulatory Visit: Payer: Medicare Other

## 2023-12-29 ENCOUNTER — Ambulatory Visit: Payer: Medicare Other | Admitting: Internal Medicine

## 2023-12-29 ENCOUNTER — Other Ambulatory Visit: Payer: Medicare Other

## 2023-12-29 DIAGNOSIS — Z86711 Personal history of pulmonary embolism: Secondary | ICD-10-CM | POA: Diagnosis not present

## 2023-12-29 DIAGNOSIS — C8330 Diffuse large B-cell lymphoma, unspecified site: Secondary | ICD-10-CM | POA: Diagnosis not present

## 2023-12-29 DIAGNOSIS — C8331 Diffuse large B-cell lymphoma, lymph nodes of head, face, and neck: Secondary | ICD-10-CM | POA: Diagnosis not present

## 2023-12-29 DIAGNOSIS — Z452 Encounter for adjustment and management of vascular access device: Secondary | ICD-10-CM | POA: Diagnosis not present

## 2023-12-29 DIAGNOSIS — Z9049 Acquired absence of other specified parts of digestive tract: Secondary | ICD-10-CM | POA: Diagnosis not present

## 2024-01-02 ENCOUNTER — Inpatient Hospital Stay: Payer: Medicare Other | Admitting: Internal Medicine

## 2024-01-02 ENCOUNTER — Inpatient Hospital Stay: Payer: Medicare Other

## 2024-01-02 ENCOUNTER — Inpatient Hospital Stay: Payer: Medicare Other | Attending: Internal Medicine

## 2024-01-02 ENCOUNTER — Encounter: Payer: Self-pay | Admitting: Internal Medicine

## 2024-01-02 VITALS — BP 120/62 | HR 78 | Temp 98.2°F | Ht 72.0 in | Wt 293.0 lb

## 2024-01-02 DIAGNOSIS — Z86711 Personal history of pulmonary embolism: Secondary | ICD-10-CM | POA: Diagnosis not present

## 2024-01-02 DIAGNOSIS — C8590 Non-Hodgkin lymphoma, unspecified, unspecified site: Secondary | ICD-10-CM | POA: Diagnosis not present

## 2024-01-02 DIAGNOSIS — K7460 Unspecified cirrhosis of liver: Secondary | ICD-10-CM | POA: Insufficient documentation

## 2024-01-02 DIAGNOSIS — D589 Hereditary hemolytic anemia, unspecified: Secondary | ICD-10-CM | POA: Diagnosis not present

## 2024-01-02 DIAGNOSIS — C83398 Diffuse large b-cell lymphoma of other extranodal and solid organ sites: Secondary | ICD-10-CM | POA: Insufficient documentation

## 2024-01-02 DIAGNOSIS — Z5112 Encounter for antineoplastic immunotherapy: Secondary | ICD-10-CM | POA: Insufficient documentation

## 2024-01-02 LAB — CBC WITH DIFFERENTIAL (CANCER CENTER ONLY)
Abs Immature Granulocytes: 0.26 10*3/uL — ABNORMAL HIGH (ref 0.00–0.07)
Basophils Absolute: 0 10*3/uL (ref 0.0–0.1)
Basophils Relative: 0 %
Eosinophils Absolute: 0.1 10*3/uL (ref 0.0–0.5)
Eosinophils Relative: 2 %
HCT: 31 % — ABNORMAL LOW (ref 39.0–52.0)
Hemoglobin: 10.2 g/dL — ABNORMAL LOW (ref 13.0–17.0)
Immature Granulocytes: 5 %
Lymphocytes Relative: 9 %
Lymphs Abs: 0.5 10*3/uL — ABNORMAL LOW (ref 0.7–4.0)
MCH: 33.6 pg (ref 26.0–34.0)
MCHC: 32.9 g/dL (ref 30.0–36.0)
MCV: 102 fL — ABNORMAL HIGH (ref 80.0–100.0)
Monocytes Absolute: 0.4 10*3/uL (ref 0.1–1.0)
Monocytes Relative: 7 %
Neutro Abs: 3.6 10*3/uL (ref 1.7–7.7)
Neutrophils Relative %: 77 %
Platelet Count: 157 10*3/uL (ref 150–400)
RBC: 3.04 MIL/uL — ABNORMAL LOW (ref 4.22–5.81)
RDW: 19.6 % — ABNORMAL HIGH (ref 11.5–15.5)
WBC Count: 4.8 10*3/uL (ref 4.0–10.5)
nRBC: 0 % (ref 0.0–0.2)

## 2024-01-02 LAB — CMP (CANCER CENTER ONLY)
ALT: 15 U/L (ref 0–44)
AST: 16 U/L (ref 15–41)
Albumin: 3.8 g/dL (ref 3.5–5.0)
Alkaline Phosphatase: 68 U/L (ref 38–126)
Anion gap: 7 (ref 5–15)
BUN: 23 mg/dL (ref 8–23)
CO2: 28 mmol/L (ref 22–32)
Calcium: 8.9 mg/dL (ref 8.9–10.3)
Chloride: 107 mmol/L (ref 98–111)
Creatinine: 1.04 mg/dL (ref 0.61–1.24)
GFR, Estimated: >60 mL/min (ref >60)
Glucose, Bld: 99 mg/dL (ref 70–99)
Potassium: 4.4 mmol/L (ref 3.5–5.1)
Sodium: 142 mmol/L (ref 135–145)
Total Bilirubin: 0.9 mg/dL (ref 0.0–1.2)
Total Protein: 5.7 g/dL — ABNORMAL LOW (ref 6.5–8.1)

## 2024-01-02 LAB — SAMPLE TO BLOOD BANK

## 2024-01-02 LAB — LACTATE DEHYDROGENASE: LDH: 191 U/L (ref 98–192)

## 2024-01-02 LAB — URIC ACID: Uric Acid, Serum: 4.6 mg/dL (ref 3.7–8.6)

## 2024-01-02 MED ORDER — SODIUM CHLORIDE 0.9 % IV SOLN
INTRAVENOUS | Status: DC
  Filled 2024-01-02: qty 250

## 2024-01-02 MED ORDER — ACETAMINOPHEN 325 MG PO TABS
650.0000 mg | ORAL_TABLET | Freq: Once | ORAL | Status: AC
  Administered 2024-01-02: 650 mg via ORAL
  Filled 2024-01-02: qty 2

## 2024-01-02 MED ORDER — SODIUM CHLORIDE 0.9 % IV SOLN
375.0000 mg/m2 | Freq: Once | INTRAVENOUS | Status: AC
  Administered 2024-01-02: 1000 mg via INTRAVENOUS
  Filled 2024-01-02: qty 100

## 2024-01-02 MED ORDER — DIPHENHYDRAMINE HCL 25 MG PO CAPS
50.0000 mg | ORAL_CAPSULE | Freq: Once | ORAL | Status: AC
  Administered 2024-01-02: 50 mg via ORAL
  Filled 2024-01-02: qty 2

## 2024-01-02 MED ORDER — SODIUM CHLORIDE 0.9 % IV SOLN
1.8000 mg/kg | Freq: Once | INTRAVENOUS | Status: AC
  Administered 2024-01-02: 230 mg via INTRAVENOUS
  Filled 2024-01-02: qty 7

## 2024-01-02 MED ORDER — PROCHLORPERAZINE MALEATE 10 MG PO TABS
10.0000 mg | ORAL_TABLET | Freq: Once | ORAL | Status: AC
  Administered 2024-01-02: 10 mg via ORAL
  Filled 2024-01-02: qty 1

## 2024-01-02 MED ORDER — DEXAMETHASONE SODIUM PHOSPHATE 10 MG/ML IJ SOLN
4.0000 mg | Freq: Once | INTRAMUSCULAR | Status: AC
  Administered 2024-01-02: 4 mg via INTRAVENOUS
  Filled 2024-01-02: qty 1

## 2024-01-02 NOTE — Patient Instructions (Signed)
 [facility] - A DEPT OF [facility]  Discharge Instructions: Thank you for choosing [facility] to provide your oncology and hematology care.  If you have a lab appointment with the Cancer Center, please go directly to the Cancer Center and check in at the registration area.  Wear comfortable clothing and clothing appropriate for easy access to any Portacath or PICC line.   We strive to give you quality time with your provider. You may need to reschedule your appointment if you arrive late (15 or more minutes).  Arriving late affects you and other patients whose appointments are after yours.  Also, if you miss three or more appointments without notifying the office, you may be dismissed from the clinic at the provider's discretion.      For prescription refill requests, have your pharmacy contact our office and allow 72 hours for refills to be completed.    Today you received the following chemotherapy and/or immunotherapy agents Ruxience & [employer] Her[employer]      To help prevent nausea and vomiting after your treatment, we encourage you to take your nausea medication as directed.  BELOW ARE SYMPTOMS THAT SHOULD BE REPORTED IMMEDIATELY: *FEVER GREATER THAN 100.4 F (38 C) OR HIGHER *CHILLS OR SWEATING *NAUSEA AND VOMITING THAT IS NOT CONTROLLED WITH YOUR NAUSEA MEDICATION *UNUSUAL SHORTNESS OF BREATH *UNUSUAL BRUISING OR BLEEDING *URINARY PROBLEMS (pain or burning when urinating, or frequent urination) *BOWEL PROBLEMS (unusual diarrhea, constipation, pain near the anus) TENDERNESS IN MOUTH AND THROAT WITH OR WITHOUT PRESENCE OF ULCERS (sore throat, sores in mouth, or a toothache) UNUSUAL RASH, SWELLING OR PAIN  UNUSUAL VAGINAL DISCHARGE OR ITCHING   Items with * indicate a potential emergency and should be followed up as soon as possible or go to the Emergency Department if any problems should occur.  Please show the CHEMOTHERAPY ALERT CARD or  IMMUNOTHERAPY ALERT CARD at check-in to the Emergency Department and triage nurse.  Should you have questions after your visit or need to cancel or reschedule your appointment, please contact [facility] - A DEPT OF [facility]  [phone] and follow the prompts.  Office hours are 8:00 a.m. to 4:30 p.m. Monday - Friday. Please note that voicemails left after 4:00 p.m. may not be returned until the following business day.  We are closed weekends and major holidays. You have access to a nurse at all times for urgent questions. Please call the main number to the clinic [phone] and follow the prompts.  For any non-urgent questions, you may also contact your provider using MyChart. We now offer e-Visits for anyone 71 and older to request care online for non-urgent symptoms. For details visit [email or web address].   Also download the MyChart app! Go to the app store, search "MyChart", open the app, select [place], and log in with your MyChart username and password.

## 2024-01-02 NOTE — Progress Notes (Signed)
 [facility] CONSULT NOTE  Patient Care Team: Malva Limes, MD as PCP - General (Family Medicine) Lemar Livings, Merrily Pew, MD (General Surgery) Earna Coder, MD as Consulting Physician (Internal Medicine)  CHIEF COMPLAINTS/PURPOSE OF CONSULTATION: Diffuse large B-cell lymphoma  #  Oncology History Overview Note  #June 29, 2019-CT- bulky bilateral neck adenopathy left more than right; September 2020-Diffuse B-cell lymphoma; ABC subtype; NEGATIVE FISH studies.  PET scan -bulky adenopathy involving neck /lymphadenopathy mediastinum /abdomen/pelvis inguinal lymph nodes; splenic involvement.  Question bone.  Stage IV [spleen involvement; no bone marrow biopsy].  BCL-2: Positive /BCL 6: Positive /MUM-1: Positive /CD10: Negative 'Ki-67: High proliferation index (90%) /CD30: Negative /Cyclin D1: Negative C-MYC: Equivocal (20% staining seen on limited and fragmented tissue) /EBER: Negative   # Ritux-Pred- 9/11; 9/18- R-COP [held adria- sec to EF 45-50%]  #May 2021-PET scan left neck recurrent disease; excisional biopsy [Dr. Vaught]-recurrent diffuse left B-cell lymphoma  #November 2020-bilateral PE; on xarelto  #J07/07/2020- 2021- Gem-CarboDex [II opinion [facility]; Dr. Noralee Stain; stem cell transplant eval]; no Rituxan-repeat CD20 negative.   #S/p 2 cycles-gem carbo Dex-June 18, 2020 PET scan-slight increase in SUV uptake of the left cervical lymph nodes; no distant metastatic disease.  Discontinued chemotherapy; refer to CAR-T;  OCT 12th 2021- [facility]- -New extensive cervical, supraclavicular, intrathoracic, axillary and abdominal and pelvic lymph nodes, worrisome for lymphoma involvement. New splenomegaly with new, diffuse uptake, now more intense than the liver, concerning for lymphoma involvement. S/p POLA-BENDA  # CAR-T/Axi-cel on 09/16/20 preceded by fludarabine/cyclophosphamide lymphodepletion.[[facility]; Dr.Grover]; complicated by CRS-G-3 [ICU]; dec 27th-PET CR.  # JAN 2025- CT/PET  scan-widespread lymphadenopathy; splenic enlargement concerning for recurrent lymphoma  # JAN 16th, 2025- LN Bx-1. Lymph node, biopsy, Right supraclavicular :      -  FINDINGS CONSISTENT WITH RECURRENT/PERSISTENT DIFFUSE LARGE B-CELL LYMPHOMA.      NOTE: THE THIN NEEDLE CORE BIOPSIES CONSISTS OF SHEETS OF MODERATE TO LARGE      SIZED CD20 POSITIVE B-CELL THAT COEXPRESS MUM1, BCL-2 AND BCL6 AND ARE NEGATIVE      FOR CD10 CONSISTENT WITH A POST GERMINAL CENTER/ACTIVATED IMMUNOPHENOTYPE.      CD3/CD5 HIGHLIGHTS SMALL BACKGROUND T CELLS.  CD23 IS NEGATIVE FOR A FOLLICULAR      DENDRITIC CELL [place].  THE PROLIFERATION RATE IS HIGH AT APPROXIMATELY 75% BY      KI-67.  FLOW CYTOMETRY IDENTIFIES STRONGLY KAPPA RESTRICTED B CELLS WITH A      NONSPECIFIC IMMUNOPHENOTYPE (CD5/CD10 NEGATIVE) WITH APPARENT LARGE SIZE BY      FORWARD SCATTER   # JAN 28th, 2025- Pola-Rituxan [bridging]   # Sep 2020- Borderline low EF/Left ventricular hypokinesis [Dr.Gollan]-s October 9th stress test-nonischemic;   # DIAGNOSIS: DLBCL     Lymphoma of lymph nodes (HCC)  07/03/2019 Initial Diagnosis   Lymphoma of lymph nodes (HCC)   07/13/2019 - 12/07/2019 Chemotherapy   The patient had palonosetron (ALOXI) injection 0.25 mg, 0.25 mg, Intravenous,  Once, 7 of 7 cycles Administration: 0.25 mg (07/20/2019), 0.25 mg (08/13/2019), 0.25 mg (11/13/2019), 0.25 mg (09/03/2019), 0.25 mg (10/02/2019), 0.25 mg (10/23/2019), 0.25 mg (12/04/2019) pegfilgrastim (NEULASTA ONPRO KIT) injection 6 mg, 6 mg, Subcutaneous, Once, 2 of 2 cycles Administration: 6 mg (10/04/2019), 6 mg (10/25/2019) pegfilgrastim-cbqv (UDENYCA) injection 6 mg, 6 mg, Subcutaneous, Once, 5 of 5 cycles Administration: 6 mg (07/23/2019), 6 mg (08/16/2019), 6 mg (09/06/2019), 6 mg (11/16/2019), 6 mg (12/07/2019) vinCRIStine (ONCOVIN) 2 mg in sodium chloride 0.9 % 50 mL chemo infusion, 2 mg, Intravenous,  Once,  7 of 7 cycles Administration: 2 mg (07/20/2019), 2 mg (08/13/2019), 2  mg (11/13/2019), 2 mg (09/03/2019), 2 mg (10/02/2019), 2 mg (10/23/2019), 2 mg (12/04/2019) cyclophosphamide (CYTOXAN) 1,900 mg in sodium chloride 0.9 % 250 mL chemo infusion, 750 mg/m2 = 1,900 mg, Intravenous,  Once, 7 of 7 cycles Administration: 1,900 mg (07/20/2019), 1,900 mg (08/13/2019), 2,000 mg (11/13/2019), 1,900 mg (09/03/2019), 2,000 mg (10/02/2019), 2,000 mg (10/23/2019), 2,000 mg (12/04/2019) etoposide (VEPESID) 250 mg in sodium chloride 0.9 % 1,000 mL chemo infusion, 100 mg/m2 = 250 mg, Intravenous,  Once, 6 of 6 cycles Dose modification: 50 mg/m2 (original dose 100 mg/m2, Cycle 3, Reason: Provider Judgment) Administration: 130 mg (08/14/2019), 130 mg (08/15/2019), 130 mg (09/03/2019), 130 mg (09/04/2019), 130 mg (09/05/2019), 130 mg (10/03/2019), 130 mg (10/04/2019), 130 mg (10/24/2019), 130 mg (10/25/2019), 130 mg (11/13/2019), 130 mg (11/14/2019), 130 mg (11/15/2019), 130 mg (10/02/2019), 130 mg (10/23/2019), 130 mg (12/04/2019), 130 mg (12/05/2019), 130 mg (12/06/2019) dexamethasone (DECADRON) 12 mg in sodium chloride 0.9 % 145 mL IVPB, , Intravenous,  Once, 8 of 8 cycles Administration:  (07/13/2019),  (07/20/2019),  (08/13/2019),  (11/13/2019),  (09/03/2019),  (10/02/2019),  (10/23/2019),  (12/04/2019) riTUXimab-pvvr (RUXIENCE) 900 mg in sodium chloride 0.9 % 250 mL (2.6471 mg/mL) infusion, 375 mg/m2 = 900 mg (100 % of original dose 375 mg/m2), Intravenous,  Once, 1 of 1 cycle Dose modification: 375 mg/m2 (original dose 375 mg/m2, Cycle 1) Administration: 900 mg (07/13/2019)  for chemotherapy treatment.    05/09/2020 - 06/06/2020 Chemotherapy   The patient had palonosetron (ALOXI) injection 0.25 mg, 0.25 mg, Intravenous,  Once, 2 of 4 cycles Administration: 0.25 mg (05/09/2020), 0.25 mg (05/30/2020) CARBOplatin (PARAPLATIN) 750 mg in sodium chloride 0.9 % 250 mL chemo infusion, 750 mg (100 % of original dose 750 mg), Intravenous,  Once, 2 of 4 cycles Dose modification:   (original dose 750 mg, Cycle 1, Reason: Provider  Judgment, Comment: labs from care every where. ) Administration: 750 mg (05/09/2020), 750 mg (05/30/2020) gemcitabine (GEMZAR) 2,500 mg in sodium chloride 0.9 % 250 mL chemo infusion, 2,584 mg, Intravenous,  Once, 2 of 4 cycles Administration: 2,500 mg (05/09/2020), 2,500 mg (05/16/2020), 2,500 mg (05/30/2020), 2,500 mg (06/06/2020) fosaprepitant (EMEND) 150 mg in sodium chloride 0.9 % 145 mL IVPB, 150 mg, Intravenous,  Once, 2 of 4 cycles Administration: 150 mg (05/09/2020), 150 mg (05/30/2020)  for chemotherapy treatment.    08/27/2020 - 08/27/2020 Chemotherapy   Patient is on Treatment Plan : NON-HODGKINS LYMPHOMA RELAPSED/ REFRACTORY DLBCL Polatuzumab + Bendamustine + Rituximab q21d x 6 cycles     11/13/2020 Cancer Staging   Staging form: Hodgkin and Non-Hodgkin Lymphoma, AJCC 8th Edition - Clinical: Stage IV (Diffuse large B-cell lymphoma) - Signed by Earna Coder, MD on 11/13/2020   11/29/2023 -  Chemotherapy   Patient is on Treatment Plan : NON-HODGKINS LYMPHOMA RELAPSED/ REFRACTORY DLBCL Polatuzumab D1 ++ Rituximab D1 q21d x 6 cycles       HISTORY OF PRESENTING ILLNESS: Ambulating independently.  Accompanied by his wife.  Eric [name] 73 y.o.  male relapsed/refractory diffuse large B-cell lymphoma [s/p CART therapy at [facility] in November 2021]-currently on salvage chemotherapy.  In the interim patient underwent pheresis last week-in the anticipation of repeat CAR-T at [facility]..   Patient noted to have improvement of swelling of his bilateral neck lymph nodes.  He admits compliance to his other medications.  No fever no chills.  No nausea vomiting.   Review of Systems  Constitutional:  Negative for  chills, diaphoresis and fever.  HENT:  Positive for hearing loss. Negative for nosebleeds and sore throat.   Eyes:  Negative for double vision.  Respiratory:  Negative for hemoptysis, sputum production and wheezing.   Cardiovascular:  Negative for chest pain, palpitations, orthopnea and leg  swelling.  Gastrointestinal:  Negative for abdominal pain, blood in stool, constipation, diarrhea, heartburn, melena, nausea and vomiting.  Genitourinary:  Negative for dysuria, frequency and urgency.  Musculoskeletal:  Negative for back pain and joint pain.  Skin: Negative.  Negative for itching and rash.  Neurological:  Negative for dizziness, tingling, focal weakness, weakness and headaches.  Endo/Heme/Allergies:  Does not bruise/bleed easily.  Psychiatric/Behavioral:  Negative for depression. The patient does not have insomnia.      MEDICAL HISTORY:  Past Medical History:  Diagnosis Date   Adopted person    Cancer [facility])    Colon polyps    Pancreatitis    Rectal bleeding 02/24/2014   Sepsis (HCC) 09/18/2019    SURGICAL HISTORY: Past Surgical History:  Procedure Laterality Date   CHOLECYSTECTOMY     COLONOSCOPY  02/28/14   IR IMAGING GUIDED PORT INSERTION  07/18/2019   IR REMOVAL TUN ACCESS W/ PORT W/O FL MOD SED  05/29/2021   LYMPH NODE BIOPSY Left 03/28/2020   Procedure: EXCISION OF SUBMANDIBULAR  LYMPH NODE;  Surgeon: Bud Face, MD;  Location: [facility];  Service: ENT;  Laterality: Left;   POLYPECTOMY     colon polyp removed   TONSILLECTOMY      SOCIAL HISTORY: Social History   Socioeconomic History   Marital status: Married    Spouse name: Not on file   Number of children: Not on file   Years of education: Not on file   Highest education level: 12th grade  Occupational History    Comment: Retired from [employer]  Tobacco Use   Smoking status: Every Day    Types: Cigars   Smokeless tobacco: Never   Tobacco comments:    smoke 1/2 ppd cigaretes since 73 yo. changed to cigars at age July 2004.   Vaping Use   Vaping status: Never Used  Substance and Sexual Activity   Alcohol use: Yes    Comment: 6-7 drinks weekly   Drug use: No   Sexual activity: Not on file  Other Topics Concern   Not on file  Social History Narrative   Lives at home with  wife; in [place]. retd from [employer]. Cigar smoker; drinks liquor. Children - grown up.    Social Drivers of Corporate investment banker Strain: Low Risk  (08/22/2023)   Overall Financial Resource Strain (CARDIA)    Difficulty of Paying Living Expenses: Not hard at all  Food Insecurity: No Food Insecurity (08/22/2023)   Hunger Vital Sign    Worried About Running Out of Food in the Last Year: Never true    Ran Out of Food in the Last Year: Never true  Transportation Needs: No Transportation Needs (08/22/2023)   PRAPARE - Administrator, Civil Service (Medical): No    Lack of Transportation (Non-Medical): No  Physical Activity: Insufficiently Active (08/22/2023)   Exercise Vital Sign    Days of Exercise per Week: 1 day    Minutes of Exercise per Session: 10 min  Stress: No Stress Concern Present (08/22/2023)   [employer] of Occupational Health - Occupational Stress Questionnaire    Feeling of Stress : Only a little  Social Connections: Moderately Isolated (08/22/2023)  Social Advertising account executive [NHANES]    Frequency of Communication with Friends and Family: Once a week    Frequency of Social Gatherings with Friends and Family: Twice a week    Attends Religious Services: Never    Database administrator or Organizations: No    Attends Engineer, structural: Not on file    Marital Status: Married  Catering manager Violence: Not on file    FAMILY HISTORY: Family History  Adopted: Yes    ALLERGIES:  is allergic to allopurinol, azithromycin, and losartan potassium.  MEDICATIONS:  Current Outpatient Medications  Medication Sig Dispense Refill   febuxostat (ULORIC) 40 MG tablet Take 40 mg a day x1 week, and then increase to 80 mg a day. 60 tablet 1   gabapentin (NEURONTIN) 100 MG capsule Take 1 capsule (100 mg total) by mouth 3 (three) times daily. 90 capsule 0   Multiple Vitamin (MULTI-VITAMIN) tablet Take 1 tablet by mouth daily.      Sulfamethoxazole-Trimethoprim (BACTRIM PO) Take 1 tablet by mouth every Monday, Wednesday, and Friday.     valACYclovir HCl (VALTREX PO) Take 1 tablet by mouth 2 (two) times daily.     ondansetron (ZOFRAN) 8 MG tablet One pill every 8 hours as needed for nausea/vomitting. (Patient not taking: Reported on 01/02/2024) 40 tablet 1   prochlorperazine (COMPAZINE) 10 MG tablet Take 1 tablet (10 mg total) by mouth every 6 (six) hours as needed for nausea or vomiting. (Patient not taking: Reported on 01/02/2024) 40 tablet 1   No current facility-administered medications for this visit.   Facility-Administered Medications Ordered in Other Visits  Medication Dose Route Frequency Provider Last Rate Last Admin   0.9 %  sodium chloride infusion   Intravenous Continuous Donneta Romberg, Worthy Flank, MD       acetaminophen (TYLENOL) tablet 650 mg  650 mg Oral Once Earna Coder, MD       dexamethasone (DECADRON) injection 4 mg  4 mg Intravenous Once Earna Coder, MD       diphenhydrAMINE (BENADRYL) capsule 50 mg  50 mg Oral Once Earna Coder, MD       polatuzumab vedotin-piiq (POLIVY) 230 mg in sodium chloride 0.9 % 100 mL (2.0628 mg/mL) chemo infusion  1.8 mg/kg (Treatment Plan Recorded) Intravenous Once Earna Coder, MD       prochlorperazine (COMPAZINE) tablet 10 mg  10 mg Oral Once Earna Coder, MD       riTUXimab-pvvr (RUXIENCE) 1,000 mg in sodium chloride 0.9 % 150 mL infusion  375 mg/m2 (Treatment Plan Recorded) Intravenous Once Earna Coder, MD          .  PHYSICAL EXAMINATION: ECOG PERFORMANCE STATUS: 1 - Symptomatic but completely ambulatory  Vitals:   01/02/24 0825 01/02/24 0841  BP: (!) 115/40 120/62  Pulse: 78   Temp: 98.2 F (36.8 C)   SpO2: 100%      Filed Weights   01/02/24 0825  Weight: 293 lb (132.9 kg)     Positive for bilateral axillary adenopathy.   Physical Exam HENT:     Head: Normocephalic and atraumatic.      Mouth/Throat:     Pharynx: No oropharyngeal exudate.  Eyes:     Pupils: Pupils are equal, round, and reactive to light.  Cardiovascular:     Rate and Rhythm: Normal rate and regular rhythm.  Pulmonary:     Effort: Pulmonary effort is normal. No respiratory distress.     Breath  sounds: Normal breath sounds. No wheezing.  Abdominal:     General: Bowel sounds are normal. There is no distension.     Palpations: Abdomen is soft. There is no mass.     Tenderness: There is no abdominal tenderness. There is no guarding or rebound.  Musculoskeletal:        General: No tenderness. Normal range of motion.     Cervical back: Normal range of motion and neck supple.  Skin:    General: Skin is warm.  Neurological:     Mental Status: He is alert and oriented to person, place, and time.  Psychiatric:        Mood and Affect: Affect normal.    LABORATORY DATA:  I have reviewed the data as listed Lab Results  Component Value Date   WBC 4.8 01/02/2024   HGB 10.2 (L) 01/02/2024   HCT 31.0 (L) 01/02/2024   MCV 102.0 (H) 01/02/2024   PLT 157 01/02/2024   Recent Labs    12/06/23 0924 12/24/23 1738 01/02/24 0804  NA 141 141 142  K 4.2 4.4 4.4  CL 101 106 107  CO2 30 26 28   GLUCOSE 122* 94 99  BUN 43* 20 23  CREATININE 1.34* 1.29* 1.04  CALCIUM 9.8 8.9 8.9  GFRNONAA 57* 60* >60  PROT 5.0* 5.8* 5.7*  ALBUMIN 3.3* 4.0 3.8  AST 28 21 16   ALT 33 23 15  ALKPHOS 85 57 68  BILITOT 1.6* 1.1 0.9    RADIOGRAPHIC STUDIES: I have personally reviewed the radiological images as listed and agreed with the findings in the report. MR BRAIN W WO CONTRAST Result Date: 12/24/2023 CLINICAL DATA:  Right arm pain and numbness EXAM: MRI HEAD WITHOUT AND WITH CONTRAST MRI CERVICAL SPINE WITHOUT AND WITH CONTRAST TECHNIQUE: Multiplanar, multiecho pulse sequences of the brain and surrounding structures, and cervical spine, to include the craniocervical junction and cervicothoracic junction, were obtained  without and with intravenous contrast. CONTRAST:  10mL GADAVIST GADOBUTROL 1 MMOL/ML IV SOLN COMPARISON:  None Available. FINDINGS: MRI HEAD FINDINGS Brain: No acute infarct, mass effect or extra-axial collection. No acute or chronic hemorrhage. There is multifocal hyperintense T2-weighted signal within the white matter. Parenchymal volume and CSF spaces are normal. The midline structures are normal. Vascular: Normal flow voids. Skull and upper cervical spine: Normal calvarium and skull base. Visualized upper cervical spine and soft tissues are normal. Sinuses/Orbits:Left maxillary sinus retention cyst.  Normal orbits. MRI CERVICAL SPINE FINDINGS Alignment: Physiologic. Vertebrae: No fracture, evidence of discitis, or bone lesion. Cord: Normal signal and morphology. Posterior Fossa, vertebral arteries, paraspinal tissues: Negative. Disc levels: C1-2: Unremarkable. C2-3: Moderate right facet hypertrophy and small disc bulge. There is no spinal canal stenosis. Mild right neural foraminal stenosis. C3-4: Small disc bulge with bilateral uncovertebral hypertrophy. Mild spinal canal stenosis. Severe bilateral neural foraminal stenosis. C4-5: Small disc bulge. Mild spinal canal stenosis. Severe bilateral neural foraminal stenosis. C5-6: Small disc bulge and endplate ridging. Mild spinal canal stenosis. Moderate right and mild left neural foraminal stenosis. C6-7: Intermediate sized disc bulge with right-greater-than-left uncovertebral hypertrophy. Moderate spinal canal stenosis. Severe bilateral neural foraminal stenosis. C7-T1: Normal disc space and facet joints. There is no spinal canal stenosis. No neural foraminal stenosis. IMPRESSION: 1. No acute intracranial abnormality. 2. Multifocal hyperintense T2-weighted signal within the white matter, nonspecific but most commonly due to chronic small vessel ischemia. 3. Moderate C6-7 spinal canal stenosis and severe bilateral neural foraminal stenosis. 4. Severe bilateral C3-4  and C4-5 neural  foraminal stenosis. 5. Moderate right C5-6 neural foraminal stenosis. Electronically Signed   By: Deatra Robinson M.D.   On: 12/24/2023 21:32   MR Cervical Spine W and Wo Contrast Result Date: 12/24/2023 CLINICAL DATA:  Right arm pain and numbness EXAM: MRI HEAD WITHOUT AND WITH CONTRAST MRI CERVICAL SPINE WITHOUT AND WITH CONTRAST TECHNIQUE: Multiplanar, multiecho pulse sequences of the brain and surrounding structures, and cervical spine, to include the craniocervical junction and cervicothoracic junction, were obtained without and with intravenous contrast. CONTRAST:  10mL GADAVIST GADOBUTROL 1 MMOL/ML IV SOLN COMPARISON:  None Available. FINDINGS: MRI HEAD FINDINGS Brain: No acute infarct, mass effect or extra-axial collection. No acute or chronic hemorrhage. There is multifocal hyperintense T2-weighted signal within the white matter. Parenchymal volume and CSF spaces are normal. The midline structures are normal. Vascular: Normal flow voids. Skull and upper cervical spine: Normal calvarium and skull base. Visualized upper cervical spine and soft tissues are normal. Sinuses/Orbits:Left maxillary sinus retention cyst.  Normal orbits. MRI CERVICAL SPINE FINDINGS Alignment: Physiologic. Vertebrae: No fracture, evidence of discitis, or bone lesion. Cord: Normal signal and morphology. Posterior Fossa, vertebral arteries, paraspinal tissues: Negative. Disc levels: C1-2: Unremarkable. C2-3: Moderate right facet hypertrophy and small disc bulge. There is no spinal canal stenosis. Mild right neural foraminal stenosis. C3-4: Small disc bulge with bilateral uncovertebral hypertrophy. Mild spinal canal stenosis. Severe bilateral neural foraminal stenosis. C4-5: Small disc bulge. Mild spinal canal stenosis. Severe bilateral neural foraminal stenosis. C5-6: Small disc bulge and endplate ridging. Mild spinal canal stenosis. Moderate right and mild left neural foraminal stenosis. C6-7: Intermediate sized disc  bulge with right-greater-than-left uncovertebral hypertrophy. Moderate spinal canal stenosis. Severe bilateral neural foraminal stenosis. C7-T1: Normal disc space and facet joints. There is no spinal canal stenosis. No neural foraminal stenosis. IMPRESSION: 1. No acute intracranial abnormality. 2. Multifocal hyperintense T2-weighted signal within the white matter, nonspecific but most commonly due to chronic small vessel ischemia. 3. Moderate C6-7 spinal canal stenosis and severe bilateral neural foraminal stenosis. 4. Severe bilateral C3-4 and C4-5 neural foraminal stenosis. 5. Moderate right C5-6 neural foraminal stenosis. Electronically Signed   By: Deatra Robinson M.D.   On: 12/24/2023 21:32   CT HEAD WO CONTRAST ( ) Result Date: 12/24/2023 CLINICAL DATA:  Right arm pain/tingling EXAM: CT HEAD WITHOUT CONTRAST CT CERVICAL SPINE WITHOUT CONTRAST TECHNIQUE: Multidetector CT imaging of the head and cervical spine was performed following the standard protocol without intravenous contrast. Multiplanar CT image reconstructions of the cervical spine were also generated. RADIATION DOSE REDUCTION: This exam was performed according to the departmental dose-optimization program which includes automated exposure control, adjustment of the mA and/or kV according to patient size and/or use of iterative reconstruction technique. COMPARISON:  None Available. FINDINGS: CT HEAD FINDINGS Brain: No evidence of acute infarction, hemorrhage, hydrocephalus, extra-axial collection or mass lesion/mass effect. Vascular: No hyperdense vessel or unexpected calcification. Skull: Normal. Negative for fracture or focal lesion. Sinuses/Orbits: The visualized paranasal sinuses are essentially clear. The mastoid air cells are unopacified. Other: None. CT CERVICAL SPINE FINDINGS Alignment: Normal cervical lordosis. Skull base and vertebrae: No acute fracture. No primary bone lesion or focal pathologic process. Soft tissues and spinal canal: No  prevertebral fluid or swelling. No visible canal hematoma. Disc levels: Mild degenerative changes of the mid cervical spine, most prominent at C6-7, with associated mild narrowing of the bilateral neural foramina. Spinal canal is patent. Upper chest: Visualized lung apices are clear. Other: Visualized thyroid is unremarkable. IMPRESSION: Normal head CT. Mild degenerative changes  of the mid cervical spine with bilateral neural foraminal narrowing at C6-7. Electronically Signed   By: Charline Bills M.D.   On: 12/24/2023 19:26   CT Cervical Spine Wo Contrast Result Date: 12/24/2023 CLINICAL DATA:  Right arm pain/tingling EXAM: CT HEAD WITHOUT CONTRAST CT CERVICAL SPINE WITHOUT CONTRAST TECHNIQUE: Multidetector CT imaging of the head and cervical spine was performed following the standard protocol without intravenous contrast. Multiplanar CT image reconstructions of the cervical spine were also generated. RADIATION DOSE REDUCTION: This exam was performed according to the departmental dose-optimization program which includes automated exposure control, adjustment of the mA and/or kV according to patient size and/or use of iterative reconstruction technique. COMPARISON:  None Available. FINDINGS: CT HEAD FINDINGS Brain: No evidence of acute infarction, hemorrhage, hydrocephalus, extra-axial collection or mass lesion/mass effect. Vascular: No hyperdense vessel or unexpected calcification. Skull: Normal. Negative for fracture or focal lesion. Sinuses/Orbits: The visualized paranasal sinuses are essentially clear. The mastoid air cells are unopacified. Other: None. CT CERVICAL SPINE FINDINGS Alignment: Normal cervical lordosis. Skull base and vertebrae: No acute fracture. No primary bone lesion or focal pathologic process. Soft tissues and spinal canal: No prevertebral fluid or swelling. No visible canal hematoma. Disc levels: Mild degenerative changes of the mid cervical spine, most prominent at C6-7, with associated  mild narrowing of the bilateral neural foramina. Spinal canal is patent. Upper chest: Visualized lung apices are clear. Other: Visualized thyroid is unremarkable. IMPRESSION: Normal head CT. Mild degenerative changes of the mid cervical spine with bilateral neural foraminal narrowing at C6-7. Electronically Signed   By: Charline Bills M.D.   On: 12/24/2023 19:26   CT ANGIO UP EXTREM RIGHT W &/OR WO CONTRAST Result Date: 12/24/2023 CLINICAL DATA:  Right arm pain x2 weeks, tingling in fingertips EXAM: CT ANGIOGRAPHY OF THE right upperEXTREMITY TECHNIQUE: Multidetector CT imaging of the right upperwas performed using the standard protocol during bolus administration of intravenous contrast. Multiplanar CT image reconstructions and MIPs were obtained to evaluate the vascular anatomy. RADIATION DOSE REDUCTION: This exam was performed according to the departmental dose-optimization program which includes automated exposure control, adjustment of the mA and/or kV according to patient size and/or use of iterative reconstruction technique. CONTRAST:  75mL OMNIPAQUE IOHEXOL 350 MG/ML SOLN COMPARISON:  None Available. FINDINGS: Right subclavian, right axillary, and right brachial arteries are patent. The radial and ulnar arteries are patent. No fracture is seen.  No fluid collection/abscess. Visualized right chest, abdomen, and pelvis are unremarkable, noting prior cholecystectomy. Review of the MIP images confirms the above findings. IMPRESSION: Normal right upper extremity CT angiogram. Electronically Signed   By: Charline Bills M.D.   On: 12/24/2023 19:23   US Venous Img Upper Uni Right(DVT) Result Date: 12/24/2023 CLINICAL DATA:  Right arm pain EXAM: RIGHT UPPER EXTREMITY VENOUS DOPPLER ULTRASOUND TECHNIQUE: Gray-scale sonography with graded compression, as well as color Doppler and duplex ultrasound were performed to evaluate the upper extremity deep venous system from the level of the subclavian vein and  including the jugular, axillary, basilic, radial, ulnar and upper cephalic vein. Spectral Doppler was utilized to evaluate flow at rest and with distal augmentation maneuvers. COMPARISON:  None Available. FINDINGS: Contralateral Subclavian Vein: Respiratory phasicity is normal and symmetric with the symptomatic side. No evidence of thrombus. Normal compressibility. Internal Jugular Vein: No evidence of thrombus. Normal compressibility, respiratory phasicity and response to augmentation. Subclavian Vein: No evidence of thrombus. Normal compressibility, respiratory phasicity and response to augmentation. Axillary Vein: No evidence of thrombus. Normal compressibility, respiratory  phasicity and response to augmentation. Cephalic Vein: No evidence of thrombus. Normal compressibility, respiratory phasicity and response to augmentation. Basilic Vein: No evidence of thrombus. Normal compressibility, respiratory phasicity and response to augmentation. Brachial Veins: No evidence of thrombus. Normal compressibility, respiratory phasicity and response to augmentation. Radial Veins: No evidence of thrombus. Normal compressibility, respiratory phasicity and response to augmentation. Ulnar Veins: No evidence of thrombus. Normal compressibility, respiratory phasicity and response to augmentation. Venous Reflux:  None visualized. Other Findings:  None visualized. IMPRESSION: No evidence of DVT within the right upper extremity. Electronically Signed   By: Agustin Cree M.D.   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  DEC 30th, 2024- Extensive tracer avid adenopathy identified within the neck, chest, abdomen and pelvis. Findings are compatible with recurrent lymphoma. Marked splenomegaly with numerous tracer avid lesions scattered throughout the spleen. Too numerous to count.  Marked  increased radiotracer uptake within the posterior nasopharynx with corresponding increased soft tissue thickening. Bilateral increased uptake identified within the tonsils and base of tongue.  Small tiny lung nodules are identified which exhibit mild increased tracer uptake. Mild diffuse increased uptake identified throughout the bone marrow without focality.  # JAN 2025-neck lymph node biopsy recurrent diffuse B-cell lymphoma-ABC subtype.  Currently awaiting re-CART [clinical trial at [facility]];   Patient currently on bridging therapy  with-POLA+ rituximab infusion-every 3 weeks x 2-3 cycles while awaiting-clinical trial at [facility].  # proceed with cycle #2 today- Labs-CBC/chemistries were reviewed with the patient. Tentative #3 ccyle in 3 weeks; discussed with Dr.Grover.   # Hemolytic anemia -nadir hemoglobin 8 - platelets-98-Hb 10.2; platelets 150s- plan as above.    # Borderline LOW EF of 45-50%; Sep Echo 50-55% [Dr.Gollan]- on coreg BID  Dr. Gollan-JAN 29th, 2025- 2D echo- 60-65%-  stable.   # Infectious Disease/Prophylaxis: will re-start valtrex/Bactrim DS.   # Cirrhosis- based Imaging [No decompensation]-no prior history of hepatitis B or C.  # High risk of tumor lysis/  elevated uric acid-10- [ Hx of rash to allopurinol]-currently on febuxostat  80 mg a day.   # PIV- s/p port explanation- pt declined port at this time. Will defer to [facility].   # DISPOSITION:  # Chemo today # cancel Blood transfusion tomorrow.   # follow up in 3 weeks-- MD;chemo-Pola-Rituxan- labs- cbc/cmp/ldh; uric acid;  hold tube; D-2 possible1 unit PRBC- Dr.B     All questions were answered. The patient knows to call the clinic with any problems, questions or concerns.    Earna Coder, MD 01/02/2024 9:08 AM

## 2024-01-02 NOTE — Progress Notes (Signed)
 Pt states he is going to [facility] this week for a clinical trial.

## 2024-01-02 NOTE — Assessment & Plan Note (Addendum)
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  DEC 30th, 2024- Extensive tracer avid adenopathy identified within the neck, chest, abdomen and pelvis. Findings are compatible with recurrent lymphoma. Marked splenomegaly with numerous tracer avid lesions scattered throughout the spleen. Too numerous to count.  Marked increased radiotracer uptake within the posterior nasopharynx with corresponding increased soft tissue thickening. Bilateral increased uptake identified within the tonsils and base of tongue.  Small tiny lung nodules are identified which exhibit mild increased tracer uptake. Mild diffuse increased uptake identified throughout the bone marrow without focality.  # JAN 2025-neck lymph node biopsy recurrent diffuse B-cell lymphoma-ABC subtype.  Currently awaiting re-CART [clinical trial at [facility]];   Patient currently on bridging therapy  with-POLA+ rituximab infusion-every 3 weeks x 2-3 cycles while awaiting-clinical trial at [facility].  # proceed with cycle #2 today- Labs-CBC/chemistries were reviewed with the patient. Tentative #3 ccyle in 3 weeks; discussed with Dr.Grover.   # Hemolytic anemia -nadir hemoglobin 8 - platelets-98-Hb 10.2; platelets 150s- plan as above.    # Borderline LOW EF of 45-50%; Sep Echo 50-55% [Dr.Gollan]- on coreg BID  Dr. Gollan-JAN 29th, 2025- 2D echo- 60-65%-  stable.   # Infectious Disease/Prophylaxis: will re-start valtrex/Bactrim DS.   # Cirrhosis- based Imaging [No decompensation]-no prior history of hepatitis B or C.  # High risk of tumor lysis/  elevated uric acid-10- [ Hx of rash to allopurinol]-currently on febuxostat  80 mg a day.   # PIV- s/p port explanation- pt declined port at this time. Will defer to [facility]   # DISPOSITION:  # Chemo today # cancel Blood transfusion tomorrow.   # follow up in 3 weeks-- MD;chemo-Pola-Rituxan- labs- cbc/cmp/ldh; uric acid;  hold tube; D-2 possible1 unit PRBC-  Dr.B

## 2024-01-03 ENCOUNTER — Other Ambulatory Visit: Payer: Self-pay

## 2024-01-03 ENCOUNTER — Inpatient Hospital Stay: Payer: Medicare Other

## 2024-01-03 DIAGNOSIS — M542 Cervicalgia: Secondary | ICD-10-CM

## 2024-01-03 NOTE — Progress Notes (Deleted)
 Referring Physician:  Malva Limes, MD [address] [place],  [place] [address]  Primary Physician:  Malva Limes, MD  History of Present Illness: 01/03/2024 [name] is here today with a chief complaint of ***  No neck pain? Intermittent tingling and pain in the right arm.   Duration: *** Location: *** Quality: *** Severity: ***  Precipitating: aggravated by *** Modifying factors: made better by *** Weakness: none Timing: *** Bowel/Bladder Dysfunction: none  Conservative measures:  Physical therapy: *** has not participated in? Multimodal medical therapy including regular antiinflammatories: *** gabapentin Injections: *** no epidural steroid injections?  Past Surgery: ***no spinal surgeries  Lutricia Horsfall has ***no symptoms of cervical myelopathy.  The symptoms are causing a significant impact on the patient's life.   Review of Systems:  A 10 point review of systems is negative, except for the pertinent positives and negatives detailed in the HPI.  Past Medical History: Past Medical History:  Diagnosis Date   Adopted person    Cancer [facility])    Colon polyps    Pancreatitis    Rectal bleeding 02/24/2014   Sepsis (HCC) 09/18/2019    Past Surgical History: Past Surgical History:  Procedure Laterality Date   CHOLECYSTECTOMY     COLONOSCOPY  02/28/14   IR IMAGING GUIDED PORT INSERTION  07/18/2019   IR REMOVAL TUN ACCESS W/ PORT W/O FL MOD SED  05/29/2021   LYMPH NODE BIOPSY Left 03/28/2020   Procedure: EXCISION OF SUBMANDIBULAR  LYMPH NODE;  Surgeon: Bud Face, MD;  Location: [facility];  Service: ENT;  Laterality: Left;   POLYPECTOMY     colon polyp removed   TONSILLECTOMY      Allergies: Allergies as of 01/04/2024 - Review Complete 01/02/2024  Allergen Reaction Noted   Allopurinol Rash 09/19/2019   Azithromycin Itching 07/03/2019   Losartan potassium Hives 10/01/2019    Medications: Outpatient Encounter  Medications as of 01/04/2024  Medication Sig   febuxostat (ULORIC) 40 MG tablet Take 40 mg a day x1 week, and then increase to 80 mg a day.   gabapentin (NEURONTIN) 100 MG capsule Take 1 capsule (100 mg total) by mouth 3 (three) times daily.   Multiple Vitamin (MULTI-VITAMIN) tablet Take 1 tablet by mouth daily.   ondansetron (ZOFRAN) 8 MG tablet One pill every 8 hours as needed for nausea/vomitting. (Patient not taking: Reported on 01/02/2024)   prochlorperazine (COMPAZINE) 10 MG tablet Take 1 tablet (10 mg total) by mouth every 6 (six) hours as needed for nausea or vomiting. (Patient not taking: Reported on 01/02/2024)   Sulfamethoxazole-Trimethoprim (BACTRIM PO) Take 1 tablet by mouth every Monday, Wednesday, and Friday.   valACYclovir HCl (VALTREX PO) Take 1 tablet by mouth 2 (two) times daily.   No facility-administered encounter medications on file as of 01/04/2024.    Social History: Social History   Tobacco Use   Smoking status: Every Day    Types: Cigars   Smokeless tobacco: Never   Tobacco comments:    smoke 1/2 ppd cigaretes since 72 yo. changed to cigars at age July 2004.   Vaping Use   Vaping status: Never Used  Substance Use Topics   Alcohol use: Yes    Comment: 6-7 drinks weekly   Drug use: No    Family Medical History: Family History  Adopted: Yes    Physical Examination: @VITALWITHPAIN @  General: Patient is well developed, well nourished, calm, collected, and in no apparent distress. Attention to examination  is appropriate.  Psychiatric: Patient is non-anxious.  Head:  Pupils equal, round, and reactive to light.  ENT:  Oral mucosa appears well hydrated.  Neck:   Supple.  ***Full range of motion.  Respiratory: Patient is breathing without any difficulty.  Extremities: No edema.  Vascular: Palpable dorsal pedal pulses.  Skin:   On exposed skin, there are no abnormal skin lesions.  NEUROLOGICAL:     Awake, alert, oriented to person, place, and time.   Speech is clear and fluent. Fund of knowledge is appropriate.   Cranial Nerves: Pupils equal round and reactive to light.  Facial tone is symmetric.  Facial sensation is symmetric.  ROM of spine: ***full.  Palpation of spine: ***non tender.    Strength: Side Biceps Triceps Deltoid Interossei Grip Wrist Ext. Wrist Flex.  R 5 5 5 5 5 5 5   L 5 5 5 5 5 5 5    Side Iliopsoas Quads Hamstring PF DF EHL  R 5 5 5 5 5 5   L 5 5 5 5 5 5    Reflexes are ***2+ and symmetric at the biceps, triceps, brachioradialis, patella and achilles.   Hoffman's is absent.  Clonus is not present.  Toes are down-going.  Bilateral upper and lower extremity sensation is intact to light touch.    Gait is normal.   No difficulty with tandem gait.   No evidence of dysmetria noted.  Medical Decision Making  Imaging: ***  I have personally reviewed the images and agree with the above interpretation.  Assessment and Plan: Mr. Burcher is a pleasant 73 y.o. male with ***    Thank you for involving me in the care of this patient.   I spent a total of *** minutes in both face-to-face and non-face-to-face activities for this visit on the date of this encounter.   Joan Flores, PA-C Dept. of Neurosurgery

## 2024-01-04 ENCOUNTER — Ambulatory Visit: Payer: Medicare Other | Admitting: Physician Assistant

## 2024-01-09 ENCOUNTER — Ambulatory Visit: Payer: Medicare Other | Admitting: Cardiovascular Disease

## 2024-01-18 ENCOUNTER — Other Ambulatory Visit: Payer: Self-pay | Admitting: Internal Medicine

## 2024-01-19 ENCOUNTER — Other Ambulatory Visit: Payer: Self-pay | Admitting: Internal Medicine

## 2024-01-19 DIAGNOSIS — C8590 Non-Hodgkin lymphoma, unspecified, unspecified site: Secondary | ICD-10-CM

## 2024-01-19 NOTE — Progress Notes (Signed)
 Please schedule PET scan on 421 as recommended by [facility].  GB

## 2024-01-20 ENCOUNTER — Other Ambulatory Visit: Payer: Self-pay

## 2024-01-24 ENCOUNTER — Inpatient Hospital Stay

## 2024-01-24 ENCOUNTER — Inpatient Hospital Stay (HOSPITAL_BASED_OUTPATIENT_CLINIC_OR_DEPARTMENT_OTHER): Admitting: Internal Medicine

## 2024-01-24 ENCOUNTER — Encounter: Payer: Self-pay | Admitting: Internal Medicine

## 2024-01-24 VITALS — BP 123/65 | HR 73 | Temp 98.2°F | Resp 20 | Ht 72.0 in | Wt 295.0 lb

## 2024-01-24 VITALS — BP 132/64 | HR 79 | Temp 98.6°F | Resp 18

## 2024-01-24 DIAGNOSIS — C8590 Non-Hodgkin lymphoma, unspecified, unspecified site: Secondary | ICD-10-CM | POA: Diagnosis not present

## 2024-01-24 DIAGNOSIS — Z5112 Encounter for antineoplastic immunotherapy: Secondary | ICD-10-CM | POA: Diagnosis not present

## 2024-01-24 DIAGNOSIS — K7460 Unspecified cirrhosis of liver: Secondary | ICD-10-CM | POA: Diagnosis not present

## 2024-01-24 DIAGNOSIS — C83398 Diffuse large b-cell lymphoma of other extranodal and solid organ sites: Secondary | ICD-10-CM | POA: Diagnosis not present

## 2024-01-24 DIAGNOSIS — Z86711 Personal history of pulmonary embolism: Secondary | ICD-10-CM | POA: Diagnosis not present

## 2024-01-24 LAB — CBC WITH DIFFERENTIAL (CANCER CENTER ONLY)
Abs Immature Granulocytes: 0.26 10*3/uL — ABNORMAL HIGH (ref 0.00–0.07)
Basophils Absolute: 0 10*3/uL (ref 0.0–0.1)
Basophils Relative: 1 %
Eosinophils Absolute: 0.3 10*3/uL (ref 0.0–0.5)
Eosinophils Relative: 5 %
HCT: 36.3 % — ABNORMAL LOW (ref 39.0–52.0)
Hemoglobin: 12.2 g/dL — ABNORMAL LOW (ref 13.0–17.0)
Immature Granulocytes: 4 %
Lymphocytes Relative: 9 %
Lymphs Abs: 0.5 10*3/uL — ABNORMAL LOW (ref 0.7–4.0)
MCH: 33.5 pg (ref 26.0–34.0)
MCHC: 33.6 g/dL (ref 30.0–36.0)
MCV: 99.7 fL (ref 80.0–100.0)
Monocytes Absolute: 0.5 10*3/uL (ref 0.1–1.0)
Monocytes Relative: 8 %
Neutro Abs: 4.4 10*3/uL (ref 1.7–7.7)
Neutrophils Relative %: 73 %
Platelet Count: 190 10*3/uL (ref 150–400)
RBC: 3.64 MIL/uL — ABNORMAL LOW (ref 4.22–5.81)
RDW: 17.7 % — ABNORMAL HIGH (ref 11.5–15.5)
WBC Count: 6 10*3/uL (ref 4.0–10.5)
nRBC: 0 % (ref 0.0–0.2)

## 2024-01-24 LAB — CMP (CANCER CENTER ONLY)
ALT: 22 U/L (ref 0–44)
AST: 27 U/L (ref 15–41)
Albumin: 4.1 g/dL (ref 3.5–5.0)
Alkaline Phosphatase: 61 U/L (ref 38–126)
Anion gap: 12 (ref 5–15)
BUN: 15 mg/dL (ref 8–23)
CO2: 23 mmol/L (ref 22–32)
Calcium: 9 mg/dL (ref 8.9–10.3)
Chloride: 106 mmol/L (ref 98–111)
Creatinine: 1.07 mg/dL (ref 0.61–1.24)
GFR, Estimated: >60 mL/min (ref >60)
Glucose, Bld: 107 mg/dL — ABNORMAL HIGH (ref 70–99)
Potassium: 3.8 mmol/L (ref 3.5–5.1)
Sodium: 141 mmol/L (ref 135–145)
Total Bilirubin: 0.9 mg/dL (ref 0.0–1.2)
Total Protein: 6.3 g/dL — ABNORMAL LOW (ref 6.5–8.1)

## 2024-01-24 LAB — LACTATE DEHYDROGENASE: LDH: 207 U/L — ABNORMAL HIGH (ref 98–192)

## 2024-01-24 LAB — URIC ACID: Uric Acid, Serum: 4.7 mg/dL (ref 3.7–8.6)

## 2024-01-24 LAB — SAMPLE TO BLOOD BANK

## 2024-01-24 MED ORDER — PROCHLORPERAZINE MALEATE 10 MG PO TABS
10.0000 mg | ORAL_TABLET | Freq: Once | ORAL | Status: AC
  Administered 2024-01-24: 10 mg via ORAL
  Filled 2024-01-24: qty 1

## 2024-01-24 MED ORDER — SODIUM CHLORIDE 0.9 % IV SOLN
INTRAVENOUS | Status: DC
Start: 2024-01-24 — End: 2024-01-24
  Filled 2024-01-24 (×2): qty 250

## 2024-01-24 MED ORDER — DEXAMETHASONE SODIUM PHOSPHATE 10 MG/ML IJ SOLN
4.0000 mg | Freq: Once | INTRAMUSCULAR | Status: AC
  Administered 2024-01-24: 4 mg via INTRAVENOUS
  Filled 2024-01-24: qty 1

## 2024-01-24 MED ORDER — DIPHENHYDRAMINE HCL 25 MG PO CAPS
50.0000 mg | ORAL_CAPSULE | Freq: Once | ORAL | Status: AC
  Administered 2024-01-24: 50 mg via ORAL
  Filled 2024-01-24: qty 2

## 2024-01-24 MED ORDER — SODIUM CHLORIDE 0.9 % IV SOLN
375.0000 mg/m2 | Freq: Once | INTRAVENOUS | Status: AC
  Administered 2024-01-24: 1000 mg via INTRAVENOUS
  Filled 2024-01-24: qty 100

## 2024-01-24 MED ORDER — SODIUM CHLORIDE 0.9 % IV SOLN
1.8000 mg/kg | Freq: Once | INTRAVENOUS | Status: AC
  Administered 2024-01-24: 230 mg via INTRAVENOUS
  Filled 2024-01-24: qty 7

## 2024-01-24 MED ORDER — FUROSEMIDE 20 MG PO TABS: ORAL_TABLET | ORAL | 0 refills | Status: DC

## 2024-01-24 MED ORDER — PREDNISONE 50 MG PO TABS: ORAL_TABLET | ORAL | 0 refills | Status: DC

## 2024-01-24 MED ORDER — ACETAMINOPHEN 325 MG PO TABS
650.0000 mg | ORAL_TABLET | Freq: Once | ORAL | Status: AC
  Administered 2024-01-24: 650 mg via ORAL
  Filled 2024-01-24: qty 2

## 2024-01-24 MED ORDER — POTASSIUM CHLORIDE CRYS ER 20 MEQ PO TBCR: EXTENDED_RELEASE_TABLET | ORAL | 3 refills | Status: DC

## 2024-01-24 NOTE — Assessment & Plan Note (Addendum)
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 DEC 30th, 2024- Extensive tracer avid adenopathy identified within the neck, chest, abdomen and pelvis. Findings are compatible with recurrent lymphoma. Marked splenomegaly with numerous tracer avid lesions scattered throughout the spleen. Too numerous to count.  Marked increased radiotracer uptake within the posterior nasopharynx with corresponding increased soft tissue thickening. Bilateral increased uptake identified within the tonsils and base of tongue.  Small tiny lung nodules are identified which exhibit mild increased tracer uptake. Mild diffuse increased uptake identified throughout the bone marrow without focality.  # JAN 2025-neck lymph node biopsy recurrent diffuse B-cell lymphoma-ABC subtype.   Patient currently on bridging therapy  with-POLA+ rituximab infusion awaiting-REPEAT CAR-T Clinical trial at [facility].  # proceed with cycle #3  today- Labs-CBC/chemistries were reviewed with the patient. PET scan on 4/14- iscussed with Dr.Grover. lymphodepletion on 4/24; infusion on 4/29-   # Hemolytic anemia -nadir hemoglobin 8 - platelets-98-Hb 12.2 ; platelets 150s- plan as above.    # Bilateral leg swelling: sec to fluid- retention/ weight gain from steroids [no CHF] lasix 40 mg a day x 7; and then one a day-with Kdur 20 meq-BID   # Borderline LOW EF of 45-50%; Sep Echo 50-55% [Dr.Gollan]- on coreg BID  Dr. Gollan-JAN 29th, 2025- 2D echo- 60-65%-  stable.   # Infectious Disease/Prophylaxis: will re-start valtrex/Bactrim DS.   # Cirrhosis- based Imaging [No decompensation]-no prior history of hepatitis B or C.  # High risk of tumor lysis/  elevated uric acid-10- [ Hx of rash to allopurinol]-currently on febuxostat  80 mg a day.   # PIV- s/p port explanation- pt declined port at this time. Will defer to [facility].   # DISPOSITION:  # Chemo today # cancel Blood transfusion tomorrow.  # follow  up in 3 weeks/ later part of the week of the 14th-- MD;NO chemo- labs- cbc/cmp/ldh; uric acid;  - Dr.B

## 2024-01-24 NOTE — Progress Notes (Signed)
 [facility] CONSULT NOTE  Patient Care Team: Malva Limes, MD as PCP - General (Family Medicine) Lemar Livings, Merrily Pew, MD (General Surgery) Earna Coder, MD as Consulting Physician (Internal Medicine)  CHIEF COMPLAINTS/PURPOSE OF CONSULTATION: Diffuse large B-cell lymphoma  #  Oncology History Overview Note  #June 29, 2019-CT- bulky bilateral neck adenopathy left more than right; September 2020-Diffuse B-cell lymphoma; ABC subtype; NEGATIVE FISH studies.  PET scan -bulky adenopathy involving neck /lymphadenopathy mediastinum /abdomen/pelvis inguinal lymph nodes; splenic involvement.  Question bone.  Stage IV [spleen involvement; no bone marrow biopsy].  BCL-2: Positive /BCL 6: Positive /MUM-1: Positive /CD10: Negative 'Ki-67: High proliferation index (90%) /CD30: Negative /Cyclin D1: Negative C-MYC: Equivocal (20% staining seen on limited and fragmented tissue) /EBER: Negative   # Ritux-Pred- 9/11; 9/18- R-COP [held adria- sec to EF 45-50%]  #May 2021-PET scan left neck recurrent disease; excisional biopsy [Dr. Vaught]-recurrent diffuse left B-cell lymphoma  #November 2020-bilateral PE; on xarelto  #J07/07/2020- 2021- Gem-CarboDex [II opinion [facility]; Dr. Noralee Stain; stem cell transplant eval]; no Rituxan-repeat CD20 negative.   #S/p 2 cycles-gem carbo Dex-June 18, 2020 PET scan-slight increase in SUV uptake of the left cervical lymph nodes; no distant metastatic disease.  Discontinued chemotherapy; refer to CAR-T;  OCT 12th 2021- [facility]- -New extensive cervical, supraclavicular, intrathoracic, axillary and abdominal and pelvic lymph nodes, worrisome for lymphoma involvement. New splenomegaly with new, diffuse uptake, now more intense than the liver, concerning for lymphoma involvement. S/p POLA-BENDA  # CAR-T/Axi-cel on 09/16/20 preceded by fludarabine/cyclophosphamide lymphodepletion.[[facility]; Dr.Grover]; complicated by CRS-G-3 [ICU]; dec 27th-PET CR.  # JAN 2025- CT/PET  scan-widespread lymphadenopathy; splenic enlargement concerning for recurrent lymphoma  # JAN 16th, 2025- LN Bx-1. Lymph node, biopsy, Right supraclavicular :      -  FINDINGS CONSISTENT WITH RECURRENT/PERSISTENT DIFFUSE LARGE B-CELL LYMPHOMA.      NOTE: THE THIN NEEDLE CORE BIOPSIES CONSISTS OF SHEETS OF MODERATE TO LARGE      SIZED CD20 POSITIVE B-CELL THAT COEXPRESS MUM1, BCL-2 AND BCL6 AND ARE NEGATIVE      FOR CD10 CONSISTENT WITH A POST GERMINAL CENTER/ACTIVATED IMMUNOPHENOTYPE.      CD3/CD5 HIGHLIGHTS SMALL BACKGROUND T CELLS.  CD23 IS NEGATIVE FOR A FOLLICULAR      DENDRITIC CELL MESHWORK.  THE PROLIFERATION RATE IS HIGH AT APPROXIMATELY 75% BY      KI-67.  FLOW CYTOMETRY IDENTIFIES STRONGLY KAPPA RESTRICTED B CELLS WITH A      NONSPECIFIC IMMUNOPHENOTYPE (CD5/CD10 NEGATIVE) WITH APPARENT LARGE SIZE BY      FORWARD SCATTER   # JAN 28th, 2025- Pola-Rituxan [bridging]   # Sep 2020- Borderline low EF/Left ventricular hypokinesis [Dr.Gollan]-s October 9th stress test-nonischemic;   # DIAGNOSIS: DLBCL     Lymphoma of lymph nodes (HCC)  07/03/2019 Initial Diagnosis   Lymphoma of lymph nodes (HCC)   07/13/2019 - 12/07/2019 Chemotherapy   The patient had palonosetron (ALOXI) injection 0.25 mg, 0.25 mg, Intravenous,  Once, 7 of 7 cycles Administration: 0.25 mg (07/20/2019), 0.25 mg (08/13/2019), 0.25 mg (11/13/2019), 0.25 mg (09/03/2019), 0.25 mg (10/02/2019), 0.25 mg (10/23/2019), 0.25 mg (12/04/2019) pegfilgrastim (NEULASTA ONPRO KIT) injection 6 mg, 6 mg, Subcutaneous, Once, 2 of 2 cycles Administration: 6 mg (10/04/2019), 6 mg (10/25/2019) pegfilgrastim-cbqv (UDENYCA) injection 6 mg, 6 mg, Subcutaneous, Once, 5 of 5 cycles Administration: 6 mg (07/23/2019), 6 mg (08/16/2019), 6 mg (09/06/2019), 6 mg (11/16/2019), 6 mg (12/07/2019) vinCRIStine (ONCOVIN) 2 mg in sodium chloride 0.9 % 50 mL chemo infusion, 2 mg, Intravenous,  Once,  7 of 7 cycles Administration: 2 mg (07/20/2019), 2 mg (08/13/2019), 2  mg (11/13/2019), 2 mg (09/03/2019), 2 mg (10/02/2019), 2 mg (10/23/2019), 2 mg (12/04/2019) cyclophosphamide (CYTOXAN) 1,900 mg in sodium chloride 0.9 % 250 mL chemo infusion, 750 mg/m2 = 1,900 mg, Intravenous,  Once, 7 of 7 cycles Administration: 1,900 mg (07/20/2019), 1,900 mg (08/13/2019), 2,000 mg (11/13/2019), 1,900 mg (09/03/2019), 2,000 mg (10/02/2019), 2,000 mg (10/23/2019), 2,000 mg (12/04/2019) etoposide (VEPESID) 250 mg in sodium chloride 0.9 % 1,000 mL chemo infusion, 100 mg/m2 = 250 mg, Intravenous,  Once, 6 of 6 cycles Dose modification: 50 mg/m2 (original dose 100 mg/m2, Cycle 3, Reason: Provider Judgment) Administration: 130 mg (08/14/2019), 130 mg (08/15/2019), 130 mg (09/03/2019), 130 mg (09/04/2019), 130 mg (09/05/2019), 130 mg (10/03/2019), 130 mg (10/04/2019), 130 mg (10/24/2019), 130 mg (10/25/2019), 130 mg (11/13/2019), 130 mg (11/14/2019), 130 mg (11/15/2019), 130 mg (10/02/2019), 130 mg (10/23/2019), 130 mg (12/04/2019), 130 mg (12/05/2019), 130 mg (12/06/2019) dexamethasone (DECADRON) 12 mg in sodium chloride 0.9 % 145 mL IVPB, , Intravenous,  Once, 8 of 8 cycles Administration:  (07/13/2019),  (07/20/2019),  (08/13/2019),  (11/13/2019),  (09/03/2019),  (10/02/2019),  (10/23/2019),  (12/04/2019) riTUXimab-pvvr (RUXIENCE) 900 mg in sodium chloride 0.9 % 250 mL (2.6471 mg/mL) infusion, 375 mg/m2 = 900 mg (100 % of original dose 375 mg/m2), Intravenous,  Once, 1 of 1 cycle Dose modification: 375 mg/m2 (original dose 375 mg/m2, Cycle 1) Administration: 900 mg (07/13/2019)  for chemotherapy treatment.    05/09/2020 - 06/06/2020 Chemotherapy   The patient had palonosetron (ALOXI) injection 0.25 mg, 0.25 mg, Intravenous,  Once, 2 of 4 cycles Administration: 0.25 mg (05/09/2020), 0.25 mg (05/30/2020) CARBOplatin (PARAPLATIN) 750 mg in sodium chloride 0.9 % 250 mL chemo infusion, 750 mg (100 % of original dose 750 mg), Intravenous,  Once, 2 of 4 cycles Dose modification:   (original dose 750 mg, Cycle 1, Reason: Provider  Judgment, Comment: labs from care every where. ) Administration: 750 mg (05/09/2020), 750 mg (05/30/2020) gemcitabine (GEMZAR) 2,500 mg in sodium chloride 0.9 % 250 mL chemo infusion, 2,584 mg, Intravenous,  Once, 2 of 4 cycles Administration: 2,500 mg (05/09/2020), 2,500 mg (05/16/2020), 2,500 mg (05/30/2020), 2,500 mg (06/06/2020) fosaprepitant (EMEND) 150 mg in sodium chloride 0.9 % 145 mL IVPB, 150 mg, Intravenous,  Once, 2 of 4 cycles Administration: 150 mg (05/09/2020), 150 mg (05/30/2020)  for chemotherapy treatment.    08/27/2020 - 08/27/2020 Chemotherapy   Patient is on Treatment Plan : NON-HODGKINS LYMPHOMA RELAPSED/ REFRACTORY DLBCL Polatuzumab + Bendamustine + Rituximab q21d x 6 cycles     11/13/2020 Cancer Staging   Staging form: Hodgkin and Non-Hodgkin Lymphoma, AJCC 8th Edition - Clinical: Stage IV (Diffuse large B-cell lymphoma) - Signed by Earna Coder, MD on 11/13/2020   11/29/2023 -  Chemotherapy   Patient is on Treatment Plan : NON-HODGKINS LYMPHOMA RELAPSED/ REFRACTORY DLBCL Polatuzumab D1 ++ Rituximab D1 q21d x 6 cycles       HISTORY OF PRESENTING ILLNESS: Ambulating independently.  Accompanied by his wife.  Eric [name] 73 y.o.  male relapsed/refractory diffuse large B-cell lymphoma [s/p CART therapy at [facility] in November 2021]-currently on bridging chemotherapy with POLA-Ritx is here for a follow up.  Bilateral leg swelling more so than usual, no pain x1 week. Right leg worse than left leg. Some redness on the shin. No cramps. No fever no chills.  No nausea vomiting.  Review of Systems  Constitutional:  Negative for chills, diaphoresis and fever.  HENT:  Positive  for hearing loss. Negative for nosebleeds and sore throat.   Eyes:  Negative for double vision.  Respiratory:  Negative for hemoptysis, sputum production and wheezing.   Cardiovascular:  Positive for leg swelling. Negative for chest pain, palpitations and orthopnea.  Gastrointestinal:  Negative for abdominal  pain, blood in stool, constipation, diarrhea, heartburn, melena, nausea and vomiting.  Genitourinary:  Negative for dysuria, frequency and urgency.  Musculoskeletal:  Negative for back pain and joint pain.  Skin: Negative.  Negative for itching and rash.  Neurological:  Negative for dizziness, tingling, focal weakness, weakness and headaches.  Endo/Heme/Allergies:  Does not bruise/bleed easily.  Psychiatric/Behavioral:  Negative for depression. The patient does not have insomnia.      MEDICAL HISTORY:  Past Medical History:  Diagnosis Date   Adopted person    Cancer [facility])    Colon polyps    Pancreatitis    Rectal bleeding 02/24/2014   Sepsis (HCC) 09/18/2019    SURGICAL HISTORY: Past Surgical History:  Procedure Laterality Date   CHOLECYSTECTOMY     COLONOSCOPY  02/28/14   IR IMAGING GUIDED PORT INSERTION  07/18/2019   IR REMOVAL TUN ACCESS W/ PORT W/O FL MOD SED  05/29/2021   LYMPH NODE BIOPSY Left 03/28/2020   Procedure: EXCISION OF SUBMANDIBULAR  LYMPH NODE;  Surgeon: Bud Face, MD;  Location: [facility];  Service: ENT;  Laterality: Left;   POLYPECTOMY     colon polyp removed   TONSILLECTOMY      SOCIAL HISTORY: Social History   Socioeconomic History   Marital status: Married    Spouse name: Not on file   Number of children: Not on file   Years of education: Not on file   Highest education level: 12th grade  Occupational History    Comment: Retired from [employer]  Tobacco Use   Smoking status: Every Day    Types: Cigars   Smokeless tobacco: Never   Tobacco comments:    smoke 1/2 ppd cigaretes since 73 yo. changed to cigars at age July 2004.   Vaping Use   Vaping status: Never Used  Substance and Sexual Activity   Alcohol use: Yes    Comment: 6-7 drinks weekly   Drug use: No   Sexual activity: Not on file  Other Topics Concern   Not on file  Social History Narrative   Lives at home with wife; in [place]. retd from [employer]. Cigar smoker;  drinks liquor. Children - grown up.    Social Drivers of Corporate investment banker Strain: Low Risk  (08/22/2023)   Overall Financial Resource Strain (CARDIA)    Difficulty of Paying Living Expenses: Not hard at all  Food Insecurity: No Food Insecurity (08/22/2023)   Hunger Vital Sign    Worried About Running Out of Food in the Last Year: Never true    Ran Out of Food in the Last Year: Never true  Transportation Needs: No Transportation Needs (08/22/2023)   PRAPARE - Administrator, Civil Service (Medical): No    Lack of Transportation (Non-Medical): No  Physical Activity: Insufficiently Active (08/22/2023)   Exercise Vital Sign    Days of Exercise per Week: 1 day    Minutes of Exercise per Session: 10 min  Stress: No Stress Concern Present (08/22/2023)   [employer] of Occupational Health - Occupational Stress Questionnaire    Feeling of Stress : Only a little  Social Connections: Moderately Isolated (08/22/2023)   Social Connection and Isolation Panel [NHANES]  Frequency of Communication with Friends and Family: Once a week    Frequency of Social Gatherings with Friends and Family: Twice a week    Attends Religious Services: Never    Database administrator or Organizations: No    Attends Engineer, structural: Not on file    Marital Status: Married  Catering manager Violence: Not on file    FAMILY HISTORY: Family History  Adopted: Yes    ALLERGIES:  is allergic to allopurinol, azithromycin, and losartan potassium.  MEDICATIONS:  Current Outpatient Medications  Medication Sig Dispense Refill   furosemide (LASIX) 20 MG tablet Take 2 pills a day x 7; and then  one a day- with Kdur 30 tablet 0   potassium chloride SA (KLOR-CON M) 20 MEQ tablet 1 pill twice a day 30 tablet 3   predniSONE (DELTASONE) 50 MG tablet Take 2 tablets once day x 5 days. START on day of your chemotherapy. Take in AM; with FOOD. 10 tablet 0   febuxostat (ULORIC) 40 MG  tablet Take 40 mg a day x1 week, and then increase to 80 mg a day. 60 tablet 1   gabapentin (NEURONTIN) 100 MG capsule Take 1 capsule (100 mg total) by mouth 3 (three) times daily. 90 capsule 0   Multiple Vitamin (MULTI-VITAMIN) tablet Take 1 tablet by mouth daily.     ondansetron (ZOFRAN) 8 MG tablet One pill every 8 hours as needed for nausea/vomitting. (Patient not taking: Reported on 11/29/2023) 40 tablet 1   prochlorperazine (COMPAZINE) 10 MG tablet Take 1 tablet (10 mg total) by mouth every 6 (six) hours as needed for nausea or vomiting. (Patient not taking: Reported on 11/29/2023) 40 tablet 1   Sulfamethoxazole-Trimethoprim (BACTRIM PO) Take 1 tablet by mouth every Monday, Wednesday, and Friday.     valACYclovir HCl (VALTREX PO) Take 1 tablet by mouth 2 (two) times daily.     No current facility-administered medications for this visit.   Facility-Administered Medications Ordered in Other Visits  Medication Dose Route Frequency Provider Last Rate Last Admin   0.9 %  sodium chloride infusion   Intravenous Continuous Earna Coder, MD 10 mL/hr at 01/24/24 1002 New Bag at 01/24/24 1002   acetaminophen (TYLENOL) tablet 650 mg  650 mg Oral Once Earna Coder, MD       dexamethasone (DECADRON) injection 4 mg  4 mg Intravenous Once Earna Coder, MD       diphenhydrAMINE (BENADRYL) capsule 50 mg  50 mg Oral Once Earna Coder, MD       polatuzumab vedotin-piiq (POLIVY) 230 mg in sodium chloride 0.9 % 100 mL (2.0628 mg/mL) chemo infusion  1.8 mg/kg (Treatment Plan Recorded) Intravenous Once Earna Coder, MD       prochlorperazine (COMPAZINE) tablet 10 mg  10 mg Oral Once Earna Coder, MD       riTUXimab-pvvr (RUXIENCE) 1,000 mg in sodium chloride 0.9 % 150 mL infusion  375 mg/m2 (Treatment Plan Recorded) Intravenous Once Earna Coder, MD          .  PHYSICAL EXAMINATION: ECOG PERFORMANCE STATUS: 1 - Symptomatic but completely  ambulatory  Vitals:   01/24/24 0914  BP: 123/65  Pulse: 73  Resp: 20  Temp: 98.2 F (36.8 C)  SpO2: 98%      Filed Weights   01/24/24 0914  Weight: 295 lb (133.8 kg)      Positive for bilateral axillary adenopathy.   Physical Exam HENT:  Head: Normocephalic and atraumatic.     Mouth/Throat:     Pharynx: No oropharyngeal exudate.  Eyes:     Pupils: Pupils are equal, round, and reactive to light.  Cardiovascular:     Rate and Rhythm: Normal rate and regular rhythm.  Pulmonary:     Effort: Pulmonary effort is normal. No respiratory distress.     Breath sounds: Normal breath sounds. No wheezing.  Abdominal:     General: Bowel sounds are normal. There is no distension.     Palpations: Abdomen is soft. There is no mass.     Tenderness: There is no abdominal tenderness. There is no guarding or rebound.  Musculoskeletal:        General: No tenderness. Normal range of motion.     Cervical back: Normal range of motion and neck supple.  Skin:    General: Skin is warm.  Neurological:     Mental Status: He is alert and oriented to person, place, and time.  Psychiatric:        Mood and Affect: Affect normal.    LABORATORY DATA:  I have reviewed the data as listed Lab Results  Component Value Date   WBC 6.0 01/24/2024   HGB 12.2 (L) 01/24/2024   HCT 36.3 (L) 01/24/2024   MCV 99.7 01/24/2024   PLT 190 01/24/2024   Recent Labs    12/24/23 1738 01/02/24 0804 01/24/24 0843  NA 141 142 141  K 4.4 4.4 3.8  CL 106 107 106  CO2 26 28 23   GLUCOSE 94 99 107*  BUN 20 23 15   CREATININE 1.29* 1.04 1.07  CALCIUM 8.9 8.9 9.0  GFRNONAA 60* >60 >60  PROT 5.8* 5.7* 6.3*  ALBUMIN 4.0 3.8 4.1  AST 21 16 27   ALT 23 15 22   ALKPHOS 57 68 61  BILITOT 1.1 0.9 0.9    RADIOGRAPHIC STUDIES: I have personally reviewed the radiological images as listed and agreed with the findings in the report. No results found.   ASSESSMENT & PLAN:   Lymphoma of lymph nodes (HCC) #  Chemo refractory/ Relapsed diffuse large B-cell lymphoma-ABC subtype; IV [spleen]; NOV 2021- CART therapy. DEC 30th, 2024- Extensive tracer avid adenopathy identified within the neck, chest, abdomen and pelvis. Findings are compatible with recurrent lymphoma. Marked splenomegaly with numerous tracer avid lesions scattered throughout the spleen. Too numerous to count.  Marked increased radiotracer uptake within the posterior nasopharynx with corresponding increased soft tissue thickening. Bilateral increased uptake identified within the tonsils and base of tongue.  Small tiny lung nodules are identified which exhibit mild increased tracer uptake. Mild diffuse increased uptake identified throughout the bone marrow without focality.  # JAN 2025-neck lymph node biopsy recurrent diffuse B-cell lymphoma-ABC subtype.   Patient currently on bridging therapy  with-POLA+ rituximab infusion awaiting-REPEAT CAR-T Clinical trial at [facility].  # proceed with cycle #3  today- Labs-CBC/chemistries were reviewed with the patient. PET scan on 4/14- iscussed with Dr.Grover. lymphodepletion on 4/24; infusion on 4/29-   # Hemolytic anemia -nadir hemoglobin 8 - platelets-98-Hb 12.2 ; platelets 150s- plan as above.    # Bilateral leg swelling: sec to fluid- retention/ weight gain from steroids [no CHF] lasix 40 mg a day x 7; and then one a day-with Kdur 20 meq-BID   # Borderline LOW EF of 45-50%; Sep Echo 50-55% [Dr.Gollan]- on coreg BID  Dr. Gollan-JAN 29th, 2025- 2D echo- 60-65%-  stable.   # Infectious Disease/Prophylaxis: will re-start valtrex/Bactrim DS.   # Cirrhosis-  based Imaging [No decompensation]-no prior history of hepatitis B or C.  # High risk of tumor lysis/  elevated uric acid-10- [ Hx of rash to allopurinol]-currently on febuxostat  80 mg a day.   # PIV- s/p port explanation- pt declined port at this time. Will defer to [facility].   # DISPOSITION:  # Chemo today # cancel Blood transfusion tomorrow.  # follow up  in 3 weeks/ later part of the week of the 14th-- MD;NO chemo- labs- cbc/cmp/ldh; uric acid;  - Dr.B     All questions were answered. The patient knows to call the clinic with any problems, questions or concerns.    Earna Coder, MD 01/24/2024 10:04 AM

## 2024-01-24 NOTE — Patient Instructions (Signed)

## 2024-01-24 NOTE — Progress Notes (Signed)
 Rt leg/ankle swelling more so than usual, no pain x1 week. Right leg worse than left leg. Some redness on the shin.

## 2024-01-25 ENCOUNTER — Other Ambulatory Visit: Payer: Self-pay

## 2024-01-25 ENCOUNTER — Inpatient Hospital Stay

## 2024-01-30 ENCOUNTER — Encounter: Payer: Self-pay | Admitting: Internal Medicine

## 2024-01-30 ENCOUNTER — Other Ambulatory Visit: Payer: Self-pay | Admitting: Internal Medicine

## 2024-01-30 ENCOUNTER — Other Ambulatory Visit: Payer: Self-pay

## 2024-01-31 ENCOUNTER — Encounter: Payer: Self-pay | Admitting: Internal Medicine

## 2024-02-13 ENCOUNTER — Ambulatory Visit

## 2024-02-14 ENCOUNTER — Ambulatory Visit
Admission: RE | Admit: 2024-02-14 | Discharge: 2024-02-14 | Disposition: A | Discharge status: Home / Self Care | Source: Ambulatory Visit | Attending: Internal Medicine

## 2024-02-14 DIAGNOSIS — C8510 Unspecified B-cell lymphoma, unspecified site: Secondary | ICD-10-CM | POA: Insufficient documentation

## 2024-02-14 DIAGNOSIS — C8590 Non-Hodgkin lymphoma, unspecified, unspecified site: Secondary | ICD-10-CM

## 2024-02-14 DIAGNOSIS — R161 Splenomegaly, not elsewhere classified: Secondary | ICD-10-CM | POA: Insufficient documentation

## 2024-02-14 DIAGNOSIS — R918 Other nonspecific abnormal finding of lung field: Secondary | ICD-10-CM | POA: Diagnosis not present

## 2024-02-14 LAB — GLUCOSE, CAPILLARY: Glucose-Capillary: 82 mg/dL (ref 70–99)

## 2024-02-14 MED ORDER — FLUDEOXYGLUCOSE F - 18 (FDG) INJECTION
12.0000 | Freq: Once | INTRAVENOUS | Status: AC | PRN
  Administered 2024-02-14: 12.76 via INTRAVENOUS

## 2024-02-15 ENCOUNTER — Inpatient Hospital Stay: Admitting: Internal Medicine

## 2024-02-15 ENCOUNTER — Encounter: Payer: Self-pay | Admitting: Internal Medicine

## 2024-02-15 ENCOUNTER — Inpatient Hospital Stay: Attending: Internal Medicine

## 2024-02-15 VITALS — BP 112/66 | HR 60 | Temp 98.5°F | Resp 20 | Ht 72.0 in | Wt 291.6 lb

## 2024-02-15 DIAGNOSIS — K7460 Unspecified cirrhosis of liver: Secondary | ICD-10-CM | POA: Insufficient documentation

## 2024-02-15 DIAGNOSIS — M7989 Other specified soft tissue disorders: Secondary | ICD-10-CM | POA: Diagnosis not present

## 2024-02-15 DIAGNOSIS — C83398 Diffuse large b-cell lymphoma of other extranodal and solid organ sites: Secondary | ICD-10-CM | POA: Insufficient documentation

## 2024-02-15 DIAGNOSIS — Z86711 Personal history of pulmonary embolism: Secondary | ICD-10-CM | POA: Diagnosis not present

## 2024-02-15 DIAGNOSIS — Z5112 Encounter for antineoplastic immunotherapy: Secondary | ICD-10-CM | POA: Insufficient documentation

## 2024-02-15 DIAGNOSIS — C8590 Non-Hodgkin lymphoma, unspecified, unspecified site: Secondary | ICD-10-CM

## 2024-02-15 LAB — CBC WITH DIFFERENTIAL (CANCER CENTER ONLY)
Abs Immature Granulocytes: 0.3 10*3/uL — ABNORMAL HIGH (ref 0.00–0.07)
Basophils Absolute: 0.1 10*3/uL (ref 0.0–0.1)
Basophils Relative: 1 %
Eosinophils Absolute: 0.2 10*3/uL (ref 0.0–0.5)
Eosinophils Relative: 3 %
HCT: 38.4 % — ABNORMAL LOW (ref 39.0–52.0)
Hemoglobin: 13.2 g/dL (ref 13.0–17.0)
Immature Granulocytes: 5 %
Lymphocytes Relative: 8 %
Lymphs Abs: 0.5 10*3/uL — ABNORMAL LOW (ref 0.7–4.0)
MCH: 33.7 pg (ref 26.0–34.0)
MCHC: 34.4 g/dL (ref 30.0–36.0)
MCV: 98 fL (ref 80.0–100.0)
Monocytes Absolute: 0.5 10*3/uL (ref 0.1–1.0)
Monocytes Relative: 9 %
Neutro Abs: 4.4 10*3/uL (ref 1.7–7.7)
Neutrophils Relative %: 74 %
Platelet Count: 182 10*3/uL (ref 150–400)
RBC: 3.92 MIL/uL — ABNORMAL LOW (ref 4.22–5.81)
RDW: 17.2 % — ABNORMAL HIGH (ref 11.5–15.5)
WBC Count: 5.8 10*3/uL (ref 4.0–10.5)
nRBC: 0 % (ref 0.0–0.2)

## 2024-02-15 LAB — CMP (CANCER CENTER ONLY)
ALT: 23 U/L (ref 0–44)
AST: 22 U/L (ref 15–41)
Albumin: 4.2 g/dL (ref 3.5–5.0)
Alkaline Phosphatase: 68 U/L (ref 38–126)
Anion gap: 9 (ref 5–15)
BUN: 18 mg/dL (ref 8–23)
CO2: 26 mmol/L (ref 22–32)
Calcium: 9 mg/dL (ref 8.9–10.3)
Chloride: 105 mmol/L (ref 98–111)
Creatinine: 0.91 mg/dL (ref 0.61–1.24)
GFR, Estimated: >60 mL/min (ref >60)
Glucose, Bld: 99 mg/dL (ref 70–99)
Potassium: 3.9 mmol/L (ref 3.5–5.1)
Sodium: 140 mmol/L (ref 135–145)
Total Bilirubin: 1 mg/dL (ref 0.0–1.2)
Total Protein: 6.2 g/dL — ABNORMAL LOW (ref 6.5–8.1)

## 2024-02-15 LAB — LACTATE DEHYDROGENASE: LDH: 215 U/L — ABNORMAL HIGH (ref 98–192)

## 2024-02-15 LAB — URIC ACID: Uric Acid, Serum: 7.9 mg/dL (ref 3.7–8.6)

## 2024-02-15 NOTE — Progress Notes (Signed)
 [facility] CONSULT NOTE  Patient Care Team: Lamon Pillow, MD as PCP - General (Family Medicine) Marquita Situ, Magali Schmitz, MD (General Surgery) Gwyn Leos, MD as Consulting Physician (Internal Medicine)  CHIEF COMPLAINTS/PURPOSE OF CONSULTATION: Diffuse large B-cell lymphoma  #  Oncology History Overview Note  #June 29, 2019-CT- bulky bilateral neck adenopathy left more than right; September 2020-Diffuse B-cell lymphoma; ABC subtype; NEGATIVE FISH studies.  PET scan -bulky adenopathy involving neck /lymphadenopathy mediastinum /abdomen/pelvis inguinal lymph nodes; splenic involvement.  Question bone.  Stage IV [spleen involvement; no bone marrow biopsy].  BCL-2: Positive /BCL 6: Positive /MUM-1: Positive /CD10: Negative 'Ki-67: High proliferation index (90%) /CD30: Negative /Cyclin D1: Negative C-MYC: Equivocal (20% staining seen on limited and fragmented tissue) /EBER: Negative   # Ritux-Pred- 9/11; 9/18- R-COP [held adria- sec to EF 45-50%]  #May 2021-PET scan left neck recurrent disease; excisional biopsy [Dr. Vaught]-recurrent diffuse left B-cell lymphoma  #November 2020-bilateral PE; on xarelto  #J07/07/2020- 2021- Gem-CarboDex [II opinion [facility]; Dr. Rennie Casco; stem cell transplant eval]; no Rituxan-repeat CD20 negative.   #S/p 2 cycles-gem carbo Dex-June 18, 2020 PET scan-slight increase in SUV uptake of the left cervical lymph nodes; no distant metastatic disease.  Discontinued chemotherapy; refer to CAR-T;  OCT 12th 2021- [facility]- -New extensive cervical, supraclavicular, intrathoracic, axillary and abdominal and pelvic lymph nodes, worrisome for lymphoma involvement. New splenomegaly with new, diffuse uptake, now more intense than the liver, concerning for lymphoma involvement. S/p POLA-BENDA  # CAR-T/Axi-cel on 09/16/20 preceded by fludarabine/cyclophosphamide lymphodepletion.[[facility]; Dr.Grover]; complicated by CRS-G-3 [ICU]; dec 27th-PET CR.  # JAN 2025- CT/PET  scan-widespread lymphadenopathy; splenic enlargement concerning for recurrent lymphoma  # JAN 16th, 2025- LN Bx-1. Lymph node, biopsy, Right supraclavicular :      -  FINDINGS CONSISTENT WITH RECURRENT/PERSISTENT DIFFUSE LARGE B-CELL LYMPHOMA.      NOTE: THE THIN NEEDLE CORE BIOPSIES CONSISTS OF SHEETS OF MODERATE TO LARGE      SIZED CD20 POSITIVE B-CELL THAT COEXPRESS MUM1, BCL-2 AND BCL6 AND ARE NEGATIVE      FOR CD10 CONSISTENT WITH A POST GERMINAL CENTER/ACTIVATED IMMUNOPHENOTYPE.      CD3/CD5 HIGHLIGHTS SMALL BACKGROUND T CELLS.  CD23 IS NEGATIVE FOR A FOLLICULAR      DENDRITIC CELL MESHWORK.  THE PROLIFERATION RATE IS HIGH AT APPROXIMATELY 75% BY      KI-67.  FLOW CYTOMETRY IDENTIFIES STRONGLY KAPPA RESTRICTED B CELLS WITH A      NONSPECIFIC IMMUNOPHENOTYPE (CD5/CD10 NEGATIVE) WITH APPARENT LARGE SIZE BY      FORWARD SCATTER   # JAN 28th, 2025- Pola-Rituxan [bridging]   # Sep 2020- Borderline low EF/Left ventricular hypokinesis [Dr.Gollan]-s October 9th stress test-nonischemic;   # DIAGNOSIS: DLBCL     Lymphoma of lymph nodes (HCC)  07/03/2019 Initial Diagnosis   Lymphoma of lymph nodes (HCC)   07/13/2019 - 12/07/2019 Chemotherapy   The patient had palonosetron (ALOXI) injection 0.25 mg, 0.25 mg, Intravenous,  Once, 7 of 7 cycles Administration: 0.25 mg (07/20/2019), 0.25 mg (08/13/2019), 0.25 mg (11/13/2019), 0.25 mg (09/03/2019), 0.25 mg (10/02/2019), 0.25 mg (10/23/2019), 0.25 mg (12/04/2019) pegfilgrastim (NEULASTA ONPRO KIT) injection 6 mg, 6 mg, Subcutaneous, Once, 2 of 2 cycles Administration: 6 mg (10/04/2019), 6 mg (10/25/2019) pegfilgrastim-cbqv (UDENYCA) injection 6 mg, 6 mg, Subcutaneous, Once, 5 of 5 cycles Administration: 6 mg (07/23/2019), 6 mg (08/16/2019), 6 mg (09/06/2019), 6 mg (11/16/2019), 6 mg (12/07/2019) vinCRIStine (ONCOVIN) 2 mg in sodium chloride 0.9 % 50 mL chemo infusion, 2 mg, Intravenous,  Once,  7 of 7 cycles Administration: 2 mg (07/20/2019), 2 mg (08/13/2019), 2  mg (11/13/2019), 2 mg (09/03/2019), 2 mg (10/02/2019), 2 mg (10/23/2019), 2 mg (12/04/2019) cyclophosphamide (CYTOXAN) 1,900 mg in sodium chloride 0.9 % 250 mL chemo infusion, 750 mg/m2 = 1,900 mg, Intravenous,  Once, 7 of 7 cycles Administration: 1,900 mg (07/20/2019), 1,900 mg (08/13/2019), 2,000 mg (11/13/2019), 1,900 mg (09/03/2019), 2,000 mg (10/02/2019), 2,000 mg (10/23/2019), 2,000 mg (12/04/2019) etoposide (VEPESID) 250 mg in sodium chloride 0.9 % 1,000 mL chemo infusion, 100 mg/m2 = 250 mg, Intravenous,  Once, 6 of 6 cycles Dose modification: 50 mg/m2 (original dose 100 mg/m2, Cycle 3, Reason: Provider Judgment) Administration: 130 mg (08/14/2019), 130 mg (08/15/2019), 130 mg (09/03/2019), 130 mg (09/04/2019), 130 mg (09/05/2019), 130 mg (10/03/2019), 130 mg (10/04/2019), 130 mg (10/24/2019), 130 mg (10/25/2019), 130 mg (11/13/2019), 130 mg (11/14/2019), 130 mg (11/15/2019), 130 mg (10/02/2019), 130 mg (10/23/2019), 130 mg (12/04/2019), 130 mg (12/05/2019), 130 mg (12/06/2019) dexamethasone (DECADRON) 12 mg in sodium chloride 0.9 % 145 mL IVPB, , Intravenous,  Once, 8 of 8 cycles Administration:  (07/13/2019),  (07/20/2019),  (08/13/2019),  (11/13/2019),  (09/03/2019),  (10/02/2019),  (10/23/2019),  (12/04/2019) riTUXimab-pvvr (RUXIENCE) 900 mg in sodium chloride 0.9 % 250 mL (2.6471 mg/mL) infusion, 375 mg/m2 = 900 mg (100 % of original dose 375 mg/m2), Intravenous,  Once, 1 of 1 cycle Dose modification: 375 mg/m2 (original dose 375 mg/m2, Cycle 1) Administration: 900 mg (07/13/2019)  for chemotherapy treatment.    05/09/2020 - 06/06/2020 Chemotherapy   The patient had palonosetron (ALOXI) injection 0.25 mg, 0.25 mg, Intravenous,  Once, 2 of 4 cycles Administration: 0.25 mg (05/09/2020), 0.25 mg (05/30/2020) CARBOplatin (PARAPLATIN) 750 mg in sodium chloride 0.9 % 250 mL chemo infusion, 750 mg (100 % of original dose 750 mg), Intravenous,  Once, 2 of 4 cycles Dose modification:   (original dose 750 mg, Cycle 1, Reason: Provider  Judgment, Comment: labs from care every where. ) Administration: 750 mg (05/09/2020), 750 mg (05/30/2020) gemcitabine (GEMZAR) 2,500 mg in sodium chloride 0.9 % 250 mL chemo infusion, 2,584 mg, Intravenous,  Once, 2 of 4 cycles Administration: 2,500 mg (05/09/2020), 2,500 mg (05/16/2020), 2,500 mg (05/30/2020), 2,500 mg (06/06/2020) fosaprepitant (EMEND) 150 mg in sodium chloride 0.9 % 145 mL IVPB, 150 mg, Intravenous,  Once, 2 of 4 cycles Administration: 150 mg (05/09/2020), 150 mg (05/30/2020)  for chemotherapy treatment.    08/27/2020 - 08/27/2020 Chemotherapy   Patient is on Treatment Plan : NON-HODGKINS LYMPHOMA RELAPSED/ REFRACTORY DLBCL Polatuzumab + Bendamustine + Rituximab q21d x 6 cycles     11/13/2020 Cancer Staging   Staging form: Hodgkin and Non-Hodgkin Lymphoma, AJCC 8th Edition - Clinical: Stage IV (Diffuse large B-cell lymphoma) - Signed by Gwyn Leos, MD on 11/13/2020   11/29/2023 -  Chemotherapy   Patient is on Treatment Plan : NON-HODGKINS LYMPHOMA RELAPSED/ REFRACTORY DLBCL Polatuzumab D1 ++ Rituximab D1 q21d x 6 cycles       HISTORY OF PRESENTING ILLNESS: Ambulating independently.  Alone.   Eric [name] 73 y.o.  male relapsed/refractory diffuse large B-cell lymphoma [s/p CART therapy at [facility] in November 2021]-currently on bridging chemotherapy with POLA-Ritx is here for a follow up/ and review the results of the PET scan.   Denies any lump. No fever no chills.  No nausea vomiting. No shortness of breath or cough.   Review of Systems  Constitutional:  Negative for chills, diaphoresis and fever.  HENT:  Positive for hearing loss. Negative for nosebleeds and  sore throat.   Eyes:  Negative for double vision.  Respiratory:  Negative for hemoptysis, sputum production and wheezing.   Cardiovascular:  Positive for leg swelling. Negative for chest pain, palpitations and orthopnea.  Gastrointestinal:  Negative for abdominal pain, blood in stool, constipation, diarrhea, heartburn,  melena, nausea and vomiting.  Genitourinary:  Negative for dysuria, frequency and urgency.  Musculoskeletal:  Negative for back pain and joint pain.  Skin: Negative.  Negative for itching and rash.  Neurological:  Negative for dizziness, tingling, focal weakness, weakness and headaches.  Endo/Heme/Allergies:  Does not bruise/bleed easily.  Psychiatric/Behavioral:  Negative for depression. The patient does not have insomnia.      MEDICAL HISTORY:  Past Medical History:  Diagnosis Date   Adopted person    Cancer [facility])    Colon polyps    Pancreatitis    Rectal bleeding 02/24/2014   Sepsis (HCC) 09/18/2019    SURGICAL HISTORY: Past Surgical History:  Procedure Laterality Date   CHOLECYSTECTOMY     COLONOSCOPY  02/28/14   IR IMAGING GUIDED PORT INSERTION  07/18/2019   IR REMOVAL TUN ACCESS W/ PORT W/O FL MOD SED  05/29/2021   LYMPH NODE BIOPSY Left 03/28/2020   Procedure: EXCISION OF SUBMANDIBULAR  LYMPH NODE;  Surgeon: Rogers Clayman, MD;  Location: [facility];  Service: ENT;  Laterality: Left;   POLYPECTOMY     colon polyp removed   TONSILLECTOMY      SOCIAL HISTORY: Social History   Socioeconomic History   Marital status: Married    Spouse name: Not on file   Number of children: Not on file   Years of education: Not on file   Highest education level: 12th grade  Occupational History    Comment: Retired from [employer]  Tobacco Use   Smoking status: Every Day    Types: Cigars   Smokeless tobacco: Never   Tobacco comments:    smoke 1/2 ppd cigaretes since 73 yo. changed to cigars at age July 2004.   Vaping Use   Vaping status: Never Used  Substance and Sexual Activity   Alcohol use: Yes    Comment: 6-7 drinks weekly   Drug use: No   Sexual activity: Not on file  Other Topics Concern   Not on file  Social History Narrative   Lives at home with wife; in [place]. retd from [employer]. Cigar smoker; drinks liquor. Children - grown up.    Social Drivers of  Corporate investment banker Strain: Low Risk  (08/22/2023)   Overall Financial Resource Strain (CARDIA)    Difficulty of Paying Living Expenses: Not hard at all  Food Insecurity: No Food Insecurity (08/22/2023)   Hunger Vital Sign    Worried About Running Out of Food in the Last Year: Never true    Ran Out of Food in the Last Year: Never true  Transportation Needs: No Transportation Needs (08/22/2023)   PRAPARE - Administrator, Civil Service (Medical): No    Lack of Transportation (Non-Medical): No  Physical Activity: Insufficiently Active (08/22/2023)   Exercise Vital Sign    Days of Exercise per Week: 1 day    Minutes of Exercise per Session: 10 min  Stress: No Stress Concern Present (08/22/2023)   [employer] of Occupational Health - Occupational Stress Questionnaire    Feeling of Stress : Only a little  Social Connections: Moderately Isolated (08/22/2023)   Social Connection and Isolation Panel [NHANES]    Frequency of Communication with  Friends and Family: Once a week    Frequency of Social Gatherings with Friends and Family: Twice a week    Attends Religious Services: Never    Database administrator or Organizations: No    Attends Engineer, structural: Not on file    Marital Status: Married  Catering manager Violence: Not on file    FAMILY HISTORY: Family History  Adopted: Yes    ALLERGIES:  is allergic to allopurinol, azithromycin, and losartan potassium.  MEDICATIONS:  Current Outpatient Medications  Medication Sig Dispense Refill   febuxostat (ULORIC) 40 MG tablet Take 40 mg a day x1 week, and then increase to 80 mg a day. 60 tablet 1   furosemide (LASIX) 20 MG tablet Take 2 pills a day x 7; and then  one a day- with Kdur (Patient taking differently: 20 mg daily. Take 2 pills a day x 7; and then  one a day- with Kdur) 30 tablet 0   gabapentin (NEURONTIN) 100 MG capsule Take 1 capsule (100 mg total) by mouth 3 (three) times daily. 90  capsule 0   Multiple Vitamin (MULTI-VITAMIN) tablet Take 1 tablet by mouth daily.     potassium chloride SA (KLOR-CON M) 20 MEQ tablet 1 pill twice a day 30 tablet 3   predniSONE (DELTASONE) 50 MG tablet Take 2 tablets once day x 5 days. START on day of your chemotherapy. Take in AM; with FOOD. 10 tablet 0   Sulfamethoxazole-Trimethoprim (BACTRIM PO) Take 1 tablet by mouth every Monday, Wednesday, and Friday.     valACYclovir HCl (VALTREX PO) Take 1 tablet by mouth 2 (two) times daily.     ondansetron (ZOFRAN) 8 MG tablet One pill every 8 hours as needed for nausea/vomitting. (Patient not taking: Reported on 02/15/2024) 40 tablet 1   prochlorperazine (COMPAZINE) 10 MG tablet Take 1 tablet (10 mg total) by mouth every 6 (six) hours as needed for nausea or vomiting. (Patient not taking: Reported on 02/15/2024) 40 tablet 1   No current facility-administered medications for this visit.      Aaron Aas  PHYSICAL EXAMINATION: ECOG PERFORMANCE STATUS: 1 - Symptomatic but completely ambulatory  Vitals:   02/15/24 1429  BP: 112/66  Pulse: 60  Resp: 20  Temp: 98.5 F (36.9 C)  SpO2: 98%      Filed Weights   02/15/24 1429  Weight: 291 lb 9.6 oz (132.3 kg)      Positive for bilateral axillary adenopathy.   Physical Exam HENT:     Head: Normocephalic and atraumatic.     Mouth/Throat:     Pharynx: No oropharyngeal exudate.  Eyes:     Pupils: Pupils are equal, round, and reactive to light.  Cardiovascular:     Rate and Rhythm: Normal rate and regular rhythm.  Pulmonary:     Effort: Pulmonary effort is normal. No respiratory distress.     Breath sounds: Normal breath sounds. No wheezing.  Abdominal:     General: Bowel sounds are normal. There is no distension.     Palpations: Abdomen is soft. There is no mass.     Tenderness: There is no abdominal tenderness. There is no guarding or rebound.  Musculoskeletal:        General: No tenderness. Normal range of motion.     Cervical back:  Normal range of motion and neck supple.  Skin:    General: Skin is warm.  Neurological:     Mental Status: He is alert and oriented to person,  place, and time.  Psychiatric:        Mood and Affect: Affect normal.    LABORATORY DATA:  I have reviewed the data as listed Lab Results  Component Value Date   WBC 5.8 02/15/2024   HGB 13.2 02/15/2024   HCT 38.4 (L) 02/15/2024   MCV 98.0 02/15/2024   PLT 182 02/15/2024   Recent Labs    01/02/24 0804 01/24/24 0843 02/15/24 1435  NA 142 141 140  K 4.4 3.8 3.9  CL 107 106 105  CO2 28 23 26   GLUCOSE 99 107* 99  BUN 23 15 18   CREATININE 1.04 1.07 0.91  CALCIUM 8.9 9.0 9.0  GFRNONAA >60 >60 >60  PROT 5.7* 6.3* 6.2*  ALBUMIN 3.8 4.1 4.2  AST 16 27 22   ALT 15 22 23   ALKPHOS 68 61 68  BILITOT 0.9 0.9 1.0    RADIOGRAPHIC STUDIES: I have personally reviewed the radiological images as listed and agreed with the findings in the report. No results found.   ASSESSMENT & PLAN:   Lymphoma of lymph nodes (HCC) # Chemo refractory/ Relapsed diffuse large B-cell lymphoma-ABC subtype; IV [spleen]; NOV 2021- CART therapy. DEC 30th, 2024- Extensive tracer avid adenopathy identified within the neck, chest, abdomen and pelvis. Findings are compatible with recurrent lymphoma. Marked splenomegaly with numerous tracer avid lesions scattered throughout the spleen. Too numerous to count.  Marked increased radiotracer uptake within the posterior nasopharynx with corresponding increased soft tissue thickening. Bilateral increased uptake identified within the tonsils and base of tongue.  Small tiny lung nodules are identified which exhibit mild increased tracer uptake. Mild diffuse increased uptake identified throughout the bone marrow without focality.  # JAN 2025-neck lymph node biopsy recurrent diffuse B-cell lymphoma-ABC subtype.   Patient currently s/p bridging therapy  with-POLA+ rituximab infusion.  Patient noted -NOT A CANDIDATE REPEAT CAR-T Clinical  trial at [facility].  # February 14, 2024-PET scan official read pending.  However review personally-significant response noted.  Further treatment -based upon the recommendations from [facility].  Discussed with Dr. Rennie Casco.  # Hemolytic anemia -[sec to DLBCL -dec 2024] Hb 13 today- monitor for now.   # Bilateral leg swelling: sec to fluid- retention/ weight gain from steroids [no CHF] s/p  lasix/ Kdur - improved STOP lasix/ Kdur   # Borderline LOW EF of 45-50%; Sep Echo 50-55% [Dr.Gollan]- on coreg BID  Dr. Gollan-JAN 29th, 2025- 2D echo- 60-65%-  stable.   # Infectious Disease/Prophylaxis: will re-start valtrex/Bactrim DS.   # Cirrhosis- based Imaging [No decompensation]-no prior history of hepatitis B or C.  # High risk of tumor lysis/  elevated uric acid-10- [ Hx of rash to allopurinol]-currently on febuxostat  80 mg a day.   # PIV- s/p port explanation- pt declined port at this time. Will defer to [facility].   # DISPOSITION:  # Follow up TBD-- Dr.B  # I reviewed the blood work- with the patient in detail; also reviewed the imaging independently [as summarized above]; and with the patient in detail.    All questions were answered. The patient knows to call the clinic with any problems, questions or concerns.    Gwyn Leos, MD 02/15/2024 3:11 PM

## 2024-02-15 NOTE — Progress Notes (Signed)
 PET 02/14/24, called for read.

## 2024-02-15 NOTE — Patient Instructions (Signed)
#   Stop the Lasix and the potassium supplementation.

## 2024-02-15 NOTE — Assessment & Plan Note (Addendum)
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 DEC 30th, 2024- Extensive tracer avid adenopathy identified within the neck, chest, abdomen and pelvis. Findings are compatible with recurrent lymphoma. Marked splenomegaly with numerous tracer avid lesions scattered throughout the spleen. Too numerous to count.  Marked increased radiotracer uptake within the posterior nasopharynx with corresponding increased soft tissue thickening. Bilateral increased uptake identified within the tonsils and base of tongue.  Small tiny lung nodules are identified which exhibit mild increased tracer uptake. Mild diffuse increased uptake identified throughout the bone marrow without focality.  # JAN 2025-neck lymph node biopsy recurrent diffuse B-cell lymphoma-ABC subtype.   Patient currently s/p bridging therapy  with-POLA+ rituximab infusion.  Patient noted -NOT A CANDIDATE REPEAT CAR-T Clinical trial at [facility].  # February 14, 2024-PET scan official read pending.  However review personally-significant response noted.  Further treatment -based upon the recommendations from [facility].  Discussed with Dr. Rennie Casco.  # Hemolytic anemia -[sec to DLBCL -dec 2024] Hb 13 today- monitor for now.   # Bilateral leg swelling: sec to fluid- retention/ weight gain from steroids [no CHF] s/p  lasix/ Kdur - improved STOP lasix/ Kdur   # Borderline LOW EF of 45-50%; Sep Echo 50-55% [Dr.Gollan]- on coreg BID  Dr. Gollan-JAN 29th, 2025- 2D echo- 60-65%-  stable.   # Infectious Disease/Prophylaxis: will re-start valtrex/Bactrim DS.   # Cirrhosis- based Imaging [No decompensation]-no prior history of hepatitis B or C.  # High risk of tumor lysis/  elevated uric acid-10- [ Hx of rash to allopurinol]-currently on febuxostat  80 mg a day.   # PIV- s/p port explanation- pt declined port at this time. Will defer to [facility].   # DISPOSITION:  # Follow up TBD-- Dr.B  # I reviewed the blood work-  with the patient in detail; also reviewed the imaging independently [as summarized above]; and with the patient in detail.

## 2024-02-16 ENCOUNTER — Other Ambulatory Visit: Payer: Self-pay

## 2024-02-16 NOTE — Progress Notes (Signed)
 Spoke to patient regarding the results of the PET scan significant response noted.  Also informed Dr. Rennie Casco.  Patient has appointment at [facility] on the 24th-plan of care to be decided.

## 2024-02-22 DIAGNOSIS — C8330 Diffuse large B-cell lymphoma, unspecified site: Secondary | ICD-10-CM | POA: Diagnosis not present

## 2024-02-23 ENCOUNTER — Encounter: Payer: Self-pay | Admitting: Internal Medicine

## 2024-02-23 ENCOUNTER — Other Ambulatory Visit: Payer: Self-pay

## 2024-02-23 ENCOUNTER — Other Ambulatory Visit: Payer: Self-pay | Admitting: Internal Medicine

## 2024-02-23 DIAGNOSIS — C8590 Non-Hodgkin lymphoma, unspecified, unspecified site: Secondary | ICD-10-CM

## 2024-02-23 NOTE — Progress Notes (Signed)
 Appt sch'd 4/30, labs are in.

## 2024-02-23 NOTE — Progress Notes (Signed)
 As per discussion with Dr. Marney Sinning schedule the patient for next available chemotherapy with rituximab -[facility] on 4/30  MD- labs- cbc/cmp; LDH; chemo- rituximab -[facility]   Thanks GB

## 2024-02-24 ENCOUNTER — Other Ambulatory Visit: Payer: Self-pay

## 2024-02-26 ENCOUNTER — Other Ambulatory Visit: Payer: Self-pay

## 2024-02-27 ENCOUNTER — Other Ambulatory Visit: Payer: Self-pay

## 2024-02-29 ENCOUNTER — Other Ambulatory Visit

## 2024-02-29 ENCOUNTER — Encounter: Payer: Self-pay | Admitting: Internal Medicine

## 2024-02-29 ENCOUNTER — Inpatient Hospital Stay

## 2024-02-29 ENCOUNTER — Inpatient Hospital Stay (HOSPITAL_BASED_OUTPATIENT_CLINIC_OR_DEPARTMENT_OTHER): Admitting: Internal Medicine

## 2024-02-29 VITALS — BP 116/69 | HR 62 | Temp 95.0°F | Resp 18

## 2024-02-29 VITALS — BP 115/62 | HR 71 | Temp 98.6°F | Resp 16 | Wt 290.0 lb

## 2024-02-29 DIAGNOSIS — K7460 Unspecified cirrhosis of liver: Secondary | ICD-10-CM | POA: Diagnosis not present

## 2024-02-29 DIAGNOSIS — C8590 Non-Hodgkin lymphoma, unspecified, unspecified site: Secondary | ICD-10-CM

## 2024-02-29 DIAGNOSIS — Z86711 Personal history of pulmonary embolism: Secondary | ICD-10-CM | POA: Diagnosis not present

## 2024-02-29 DIAGNOSIS — C83398 Diffuse large b-cell lymphoma of other extranodal and solid organ sites: Secondary | ICD-10-CM | POA: Diagnosis not present

## 2024-02-29 DIAGNOSIS — M7989 Other specified soft tissue disorders: Secondary | ICD-10-CM | POA: Diagnosis not present

## 2024-02-29 DIAGNOSIS — Z5112 Encounter for antineoplastic immunotherapy: Secondary | ICD-10-CM | POA: Diagnosis not present

## 2024-02-29 LAB — CBC WITH DIFFERENTIAL (CANCER CENTER ONLY)
Abs Immature Granulocytes: 0.09 10*3/uL — ABNORMAL HIGH (ref 0.00–0.07)
Basophils Absolute: 0 10*3/uL (ref 0.0–0.1)
Basophils Relative: 1 %
Eosinophils Absolute: 0.3 10*3/uL (ref 0.0–0.5)
Eosinophils Relative: 5 %
HCT: 36.6 % — ABNORMAL LOW (ref 39.0–52.0)
Hemoglobin: 12.6 g/dL — ABNORMAL LOW (ref 13.0–17.0)
Immature Granulocytes: 2 %
Lymphocytes Relative: 12 %
Lymphs Abs: 0.6 10*3/uL — ABNORMAL LOW (ref 0.7–4.0)
MCH: 33.3 pg (ref 26.0–34.0)
MCHC: 34.4 g/dL (ref 30.0–36.0)
MCV: 96.8 fL (ref 80.0–100.0)
Monocytes Absolute: 0.5 10*3/uL (ref 0.1–1.0)
Monocytes Relative: 10 %
Neutro Abs: 3.6 10*3/uL (ref 1.7–7.7)
Neutrophils Relative %: 70 %
Platelet Count: 189 10*3/uL (ref 150–400)
RBC: 3.78 MIL/uL — ABNORMAL LOW (ref 4.22–5.81)
RDW: 16.6 % — ABNORMAL HIGH (ref 11.5–15.5)
WBC Count: 5.1 10*3/uL (ref 4.0–10.5)
nRBC: 0 % (ref 0.0–0.2)

## 2024-02-29 LAB — CMP (CANCER CENTER ONLY)
ALT: 17 U/L (ref 0–44)
AST: 19 U/L (ref 15–41)
Albumin: 4.1 g/dL (ref 3.5–5.0)
Alkaline Phosphatase: 71 U/L (ref 38–126)
Anion gap: 8 (ref 5–15)
BUN: 23 mg/dL (ref 8–23)
CO2: 26 mmol/L (ref 22–32)
Calcium: 9.1 mg/dL (ref 8.9–10.3)
Chloride: 106 mmol/L (ref 98–111)
Creatinine: 1.05 mg/dL (ref 0.61–1.24)
GFR, Estimated: >60 mL/min (ref >60)
Glucose, Bld: 96 mg/dL (ref 70–99)
Potassium: 4 mmol/L (ref 3.5–5.1)
Sodium: 140 mmol/L (ref 135–145)
Total Bilirubin: 0.6 mg/dL (ref 0.0–1.2)
Total Protein: 6 g/dL — ABNORMAL LOW (ref 6.5–8.1)

## 2024-02-29 LAB — LACTATE DEHYDROGENASE: LDH: 191 U/L (ref 98–192)

## 2024-02-29 MED ORDER — PROCHLORPERAZINE MALEATE 10 MG PO TABS
10.0000 mg | ORAL_TABLET | Freq: Once | ORAL | Status: AC
  Administered 2024-02-29: 10 mg via ORAL
  Filled 2024-02-29: qty 1

## 2024-02-29 MED ORDER — ACETAMINOPHEN 325 MG PO TABS
650.0000 mg | ORAL_TABLET | Freq: Once | ORAL | Status: AC
  Administered 2024-02-29: 650 mg via ORAL
  Filled 2024-02-29: qty 2

## 2024-02-29 MED ORDER — DIPHENHYDRAMINE HCL 25 MG PO CAPS
50.0000 mg | ORAL_CAPSULE | Freq: Once | ORAL | Status: AC
  Administered 2024-02-29: 50 mg via ORAL
  Filled 2024-02-29: qty 2

## 2024-02-29 MED ORDER — SODIUM CHLORIDE 0.9 % IV SOLN
375.0000 mg/m2 | Freq: Once | INTRAVENOUS | Status: AC
  Administered 2024-02-29: 1000 mg via INTRAVENOUS
  Filled 2024-02-29: qty 100

## 2024-02-29 MED ORDER — SODIUM CHLORIDE 0.9 % IV SOLN
INTRAVENOUS | Status: DC
  Filled 2024-02-29: qty 250

## 2024-02-29 MED ORDER — SODIUM CHLORIDE 0.9 % IV SOLN
1.8000 mg/kg | Freq: Once | INTRAVENOUS | Status: AC
  Administered 2024-02-29: 230 mg via INTRAVENOUS
  Filled 2024-02-29: qty 7

## 2024-02-29 MED ORDER — DEXAMETHASONE SODIUM PHOSPHATE 10 MG/ML IJ SOLN
4.0000 mg | Freq: Once | INTRAMUSCULAR | Status: AC
  Administered 2024-02-29: 4 mg via INTRAVENOUS
  Filled 2024-02-29: qty 1

## 2024-02-29 MED ORDER — SODIUM CHLORIDE 0.9 % IV SOLN
375.0000 mg/m2 | Freq: Once | INTRAVENOUS | Status: DC
  Filled 2024-02-29: qty 100

## 2024-02-29 NOTE — Progress Notes (Signed)
 [facility] CONSULT NOTE  Patient Care Team: Lamon Pillow, MD as PCP - General (Family Medicine) Marquita Situ, Magali Schmitz, MD (General Surgery) Gwyn Leos, MD as Consulting Physician (Internal Medicine)  CHIEF COMPLAINTS/PURPOSE OF CONSULTATION: Diffuse large B-cell lymphoma  #  Oncology History Overview Note  #June 29, 2019-CT- bulky bilateral neck adenopathy left more than right; September 2020-Diffuse B-cell lymphoma; ABC subtype; NEGATIVE FISH studies.  PET scan -bulky adenopathy involving neck /lymphadenopathy mediastinum /abdomen/pelvis inguinal lymph nodes; splenic involvement.  Question bone.  Stage IV [spleen involvement; no bone marrow biopsy].  BCL-2: Positive /BCL 6: Positive /MUM-1: Positive /CD10: Negative 'Ki-67: High proliferation index (90%) /CD30: Negative /Cyclin D1: Negative C-MYC: Equivocal (20% staining seen on limited and fragmented tissue) /EBER: Negative   # Ritux-Pred- 9/11; 9/18- R-COP [held adria- sec to EF 45-50%]  #May 2021-PET scan left neck recurrent disease; excisional biopsy [Dr. Vaught]-recurrent diffuse left B-cell lymphoma  #November 2020-bilateral PE; on xarelto   #J07/07/2020- 2021- Gem-CarboDex [II opinion [facility]; Dr. Rennie Casco; stem cell transplant eval]; no Rituxan -repeat CD20 negative.   #S/p 2 cycles-gem carbo Dex-June 18, 2020 PET scan-slight increase in SUV uptake of the left cervical lymph nodes; no distant metastatic disease.  Discontinued chemotherapy; refer to CAR-T;  OCT 12th 2021- [facility]- -New extensive cervical, supraclavicular, intrathoracic, axillary and abdominal and pelvic lymph nodes, worrisome for lymphoma involvement. New splenomegaly with new, diffuse uptake, now more intense than the liver, concerning for lymphoma involvement. S/p POLA-BENDA  # CAR-T/Axi-cel on 09/16/20 preceded by fludarabine /cyclophosphamide  lymphodepletion.[[facility]; Dr.Grover]; complicated by CRS-G-3 [ICU]; dec 27th-PET CR.  # JAN 2025- CT/PET  scan-widespread lymphadenopathy; splenic enlargement concerning for recurrent lymphoma  # JAN 16th, 2025- LN Bx-1. Lymph node, biopsy, Right supraclavicular :      -  FINDINGS CONSISTENT WITH RECURRENT/PERSISTENT DIFFUSE LARGE B-CELL LYMPHOMA.      NOTE: THE THIN NEEDLE CORE BIOPSIES CONSISTS OF SHEETS OF MODERATE TO LARGE      SIZED CD20 POSITIVE B-CELL THAT COEXPRESS MUM1, BCL-2 AND BCL6 AND ARE NEGATIVE      FOR CD10 CONSISTENT WITH A POST GERMINAL CENTER/ACTIVATED IMMUNOPHENOTYPE.      CD3/CD5 HIGHLIGHTS SMALL BACKGROUND T CELLS.  CD23 IS NEGATIVE FOR A FOLLICULAR      DENDRITIC CELL MESHWORK.  THE PROLIFERATION RATE IS HIGH AT APPROXIMATELY 75% BY      KI-67.  FLOW CYTOMETRY IDENTIFIES STRONGLY KAPPA RESTRICTED B CELLS WITH A      NONSPECIFIC IMMUNOPHENOTYPE (CD5/CD10 NEGATIVE) WITH APPARENT LARGE SIZE BY      FORWARD SCATTER   # JAN 28th, 2025- Pola-Rituxan  [bridging]   # Sep 2020- Borderline low EF/Left ventricular hypokinesis [Dr.Gollan]-s October 9th stress test-nonischemic;   # DIAGNOSIS: DLBCL     Lymphoma of lymph nodes (HCC)  07/03/2019 Initial Diagnosis   Lymphoma of lymph nodes (HCC)   07/13/2019 - 12/07/2019 Chemotherapy   The patient had palonosetron  (ALOXI ) injection 0.25 mg, 0.25 mg, Intravenous,  Once, 7 of 7 cycles Administration: 0.25 mg (07/20/2019), 0.25 mg (08/13/2019), 0.25 mg (11/13/2019), 0.25 mg (09/03/2019), 0.25 mg (10/02/2019), 0.25 mg (10/23/2019), 0.25 mg (12/04/2019) pegfilgrastim  (NEULASTA  ONPRO KIT) injection 6 mg, 6 mg, Subcutaneous, Once, 2 of 2 cycles Administration: 6 mg (10/04/2019), 6 mg (10/25/2019) pegfilgrastim -cbqv (UDENYCA ) injection 6 mg, 6 mg, Subcutaneous, Once, 5 of 5 cycles Administration: 6 mg (07/23/2019), 6 mg (08/16/2019), 6 mg (09/06/2019), 6 mg (11/16/2019), 6 mg (12/07/2019) vinCRIStine  (ONCOVIN ) 2 mg in sodium chloride  0.9 % 50 mL chemo infusion, 2 mg, Intravenous,  Once,  7 of 7 cycles Administration: 2 mg (07/20/2019), 2 mg (08/13/2019), 2  mg (11/13/2019), 2 mg (09/03/2019), 2 mg (10/02/2019), 2 mg (10/23/2019), 2 mg (12/04/2019) cyclophosphamide  (CYTOXAN ) 1,900 mg in sodium chloride  0.9 % 250 mL chemo infusion, 750 mg/m2 = 1,900 mg, Intravenous,  Once, 7 of 7 cycles Administration: 1,900 mg (07/20/2019), 1,900 mg (08/13/2019), 2,000 mg (11/13/2019), 1,900 mg (09/03/2019), 2,000 mg (10/02/2019), 2,000 mg (10/23/2019), 2,000 mg (12/04/2019) etoposide  (VEPESID ) 250 mg in sodium chloride  0.9 % 1,000 mL chemo infusion, 100 mg/m2 = 250 mg, Intravenous,  Once, 6 of 6 cycles Dose modification: 50 mg/m2 (original dose 100 mg/m2, Cycle 3, Reason: Provider Judgment) Administration: 130 mg (08/14/2019), 130 mg (08/15/2019), 130 mg (09/03/2019), 130 mg (09/04/2019), 130 mg (09/05/2019), 130 mg (10/03/2019), 130 mg (10/04/2019), 130 mg (10/24/2019), 130 mg (10/25/2019), 130 mg (11/13/2019), 130 mg (11/14/2019), 130 mg (11/15/2019), 130 mg (10/02/2019), 130 mg (10/23/2019), 130 mg (12/04/2019), 130 mg (12/05/2019), 130 mg (12/06/2019) dexamethasone  (DECADRON ) 12 mg in sodium chloride  0.9 % 145 mL IVPB, , Intravenous,  Once, 8 of 8 cycles Administration:  (07/13/2019),  (07/20/2019),  (08/13/2019),  (11/13/2019),  (09/03/2019),  (10/02/2019),  (10/23/2019),  (12/04/2019) riTUXimab -pvvr (RUXIENCE ) 900 mg in sodium chloride  0.9 % 250 mL (2.6471 mg/mL) infusion, 375 mg/m2 = 900 mg (100 % of original dose 375 mg/m2), Intravenous,  Once, 1 of 1 cycle Dose modification: 375 mg/m2 (original dose 375 mg/m2, Cycle 1) Administration: 900 mg (07/13/2019)  for chemotherapy treatment.    05/09/2020 - 06/06/2020 Chemotherapy   The patient had palonosetron  (ALOXI ) injection 0.25 mg, 0.25 mg, Intravenous,  Once, 2 of 4 cycles Administration: 0.25 mg (05/09/2020), 0.25 mg (05/30/2020) CARBOplatin  (PARAPLATIN ) 750 mg in sodium chloride  0.9 % 250 mL chemo infusion, 750 mg (100 % of original dose 750 mg), Intravenous,  Once, 2 of 4 cycles Dose modification:   (original dose 750 mg, Cycle 1, Reason: Provider  Judgment, Comment: labs from care every where. ) Administration: 750 mg (05/09/2020), 750 mg (05/30/2020) gemcitabine  (GEMZAR ) 2,500 mg in sodium chloride  0.9 % 250 mL chemo infusion, 2,584 mg, Intravenous,  Once, 2 of 4 cycles Administration: 2,500 mg (05/09/2020), 2,500 mg (05/16/2020), 2,500 mg (05/30/2020), 2,500 mg (06/06/2020) fosaprepitant  (EMEND ) 150 mg in sodium chloride  0.9 % 145 mL IVPB, 150 mg, Intravenous,  Once, 2 of 4 cycles Administration: 150 mg (05/09/2020), 150 mg (05/30/2020)  for chemotherapy treatment.    08/27/2020 - 08/27/2020 Chemotherapy   Patient is on Treatment Plan : NON-HODGKINS LYMPHOMA RELAPSED/ REFRACTORY DLBCL Polatuzumab + Bendamustine  + Rituximab  q21d x 6 cycles     11/13/2020 Cancer Staging   Staging form: Hodgkin and Non-Hodgkin Lymphoma, AJCC 8th Edition - Clinical: Stage IV (Diffuse large B-cell lymphoma) - Signed by Amaan Meyer R, MD on 11/13/2020   11/29/2023 -  Chemotherapy   Patient is on Treatment Plan : NON-HODGKINS LYMPHOMA RELAPSED/ REFRACTORY DLBCL Polatuzumab D1 ++ Rituximab  D1 q21d x 6 cycles       HISTORY OF PRESENTING ILLNESS: Ambulating independently.  Alone.   Eric [name] 73 y.o.  male relapsed/refractory diffuse large B-cell lymphoma [s/p CART therapy at [facility] in November 2021]-currently on bridging chemotherapy with POLA-Ritx is here for a follow up.    Denies any lump. No fever no chills.  No nausea vomiting. No shortness of breath or cough.   Review of Systems  Constitutional:  Negative for chills, diaphoresis and fever.  HENT:  Positive for hearing loss. Negative for nosebleeds and sore throat.   Eyes:  Negative  for double vision.  Respiratory:  Negative for hemoptysis, sputum production and wheezing.   Cardiovascular:  Positive for leg swelling. Negative for chest pain, palpitations and orthopnea.  Gastrointestinal:  Negative for abdominal pain, blood in stool, constipation, diarrhea, heartburn, melena, nausea and vomiting.   Genitourinary:  Negative for dysuria, frequency and urgency.  Musculoskeletal:  Negative for back pain and joint pain.  Skin: Negative.  Negative for itching and rash.  Neurological:  Negative for dizziness, tingling, focal weakness, weakness and headaches.  Endo/Heme/Allergies:  Does not bruise/bleed easily.  Psychiatric/Behavioral:  Negative for depression. The patient does not have insomnia.      MEDICAL HISTORY:  Past Medical History:  Diagnosis Date   Adopted person    Cancer [facility])    Colon polyps    Pancreatitis    Rectal bleeding 02/24/2014   Sepsis (HCC) 09/18/2019    SURGICAL HISTORY: Past Surgical History:  Procedure Laterality Date   CHOLECYSTECTOMY     COLONOSCOPY  02/28/14   IR IMAGING GUIDED PORT INSERTION  07/18/2019   IR REMOVAL TUN ACCESS W/ PORT W/O FL MOD SED  05/29/2021   LYMPH NODE BIOPSY Left 03/28/2020   Procedure: EXCISION OF SUBMANDIBULAR  LYMPH NODE;  Surgeon: Rogers Clayman, MD;  Location: [facility];  Service: ENT;  Laterality: Left;   POLYPECTOMY     colon polyp removed   TONSILLECTOMY      SOCIAL HISTORY: Social History   Socioeconomic History   Marital status: Married    Spouse name: Not on file   Number of children: Not on file   Years of education: Not on file   Highest education level: 12th grade  Occupational History    Comment: Retired from [employer]  Tobacco Use   Smoking status: Every Day    Types: Cigars   Smokeless tobacco: Never   Tobacco comments:    smoke 1/2 ppd cigaretes since 73 yo. changed to cigars at age July 2004.   Vaping Use   Vaping status: Never Used  Substance and Sexual Activity   Alcohol use: Yes    Comment: 6-7 drinks weekly   Drug use: No   Sexual activity: Not on file  Other Topics Concern   Not on file  Social History Narrative   Lives at home with wife; in [place]. retd from [employer]. Cigar smoker; drinks liquor. Children - grown up.    Social Drivers of Corporate investment banker  Strain: Low Risk  (08/22/2023)   Overall Financial Resource Strain (CARDIA)    Difficulty of Paying Living Expenses: Not hard at all  Food Insecurity: No Food Insecurity (08/22/2023)   Hunger Vital Sign    Worried About Running Out of Food in the Last Year: Never true    Ran Out of Food in the Last Year: Never true  Transportation Needs: No Transportation Needs (08/22/2023)   PRAPARE - Administrator, Civil Service (Medical): No    Lack of Transportation (Non-Medical): No  Physical Activity: Insufficiently Active (08/22/2023)   Exercise Vital Sign    Days of Exercise per Week: 1 day    Minutes of Exercise per Session: 10 min  Stress: No Stress Concern Present (08/22/2023)   [employer] of Occupational Health - Occupational Stress Questionnaire    Feeling of Stress : Only a little  Social Connections: Moderately Isolated (08/22/2023)   Social Connection and Isolation Panel [NHANES]    Frequency of Communication with Friends and Family: Once a week  Frequency of Social Gatherings with Friends and Family: Twice a week    Attends Religious Services: Never    Database administrator or Organizations: No    Attends Engineer, structural: Not on file    Marital Status: Married  Catering manager Violence: Not on file    FAMILY HISTORY: Family History  Adopted: Yes    ALLERGIES:  is allergic to allopurinol , azithromycin , and losartan  potassium.  MEDICATIONS:  Current Outpatient Medications  Medication Sig Dispense Refill   febuxostat  (ULORIC ) 40 MG tablet Take 40 mg a day x1 week, and then increase to 80 mg a day. 60 tablet 1   Multiple Vitamin (MULTI-VITAMIN) tablet Take 1 tablet by mouth daily.     Sulfamethoxazole -Trimethoprim  (BACTRIM  PO) Take 1 tablet by mouth every Monday, Wednesday, and Friday.     valACYclovir  HCl (VALTREX  PO) Take 1 tablet by mouth 2 (two) times daily.     furosemide  (LASIX ) 20 MG tablet Take 2 pills a day x 7; and then  one a day-  with Kdur (Patient not taking: Reported on 02/29/2024) 30 tablet 0   gabapentin  (NEURONTIN ) 100 MG capsule Take 1 capsule (100 mg total) by mouth 3 (three) times daily. (Patient not taking: Reported on 02/29/2024) 90 capsule 0   ondansetron  (ZOFRAN ) 8 MG tablet One pill every 8 hours as needed for nausea/vomitting. (Patient not taking: Reported on 11/29/2023) 40 tablet 1   potassium chloride  SA (KLOR-CON  M) 20 MEQ tablet 1 pill twice a day (Patient not taking: Reported on 02/29/2024) 30 tablet 3   predniSONE  (DELTASONE ) 50 MG tablet Take 2 tablets once day x 5 days. START on day of your chemotherapy. Take in AM; with FOOD. (Patient not taking: Reported on 02/29/2024) 10 tablet 0   prochlorperazine  (COMPAZINE ) 10 MG tablet Take 1 tablet (10 mg total) by mouth every 6 (six) hours as needed for nausea or vomiting. (Patient not taking: Reported on 11/29/2023) 40 tablet 1   No current facility-administered medications for this visit.      Aaron Aas  PHYSICAL EXAMINATION: ECOG PERFORMANCE STATUS: 1 - Symptomatic but completely ambulatory  Vitals:   02/29/24 0842  BP: 115/62  Pulse: 71  Resp: 16  Temp: 98.6 F (37 C)  SpO2: 98%      Filed Weights   02/29/24 0842  Weight: 290 lb (131.5 kg)      Positive for bilateral axillary adenopathy.   Physical Exam HENT:     Head: Normocephalic and atraumatic.     Mouth/Throat:     Pharynx: No oropharyngeal exudate.  Eyes:     Pupils: Pupils are equal, round, and reactive to light.  Cardiovascular:     Rate and Rhythm: Normal rate and regular rhythm.  Pulmonary:     Effort: Pulmonary effort is normal. No respiratory distress.     Breath sounds: Normal breath sounds. No wheezing.  Abdominal:     General: Bowel sounds are normal. There is no distension.     Palpations: Abdomen is soft. There is no mass.     Tenderness: There is no abdominal tenderness. There is no guarding or rebound.  Musculoskeletal:        General: No tenderness. Normal range of  motion.     Cervical back: Normal range of motion and neck supple.  Skin:    General: Skin is warm.  Neurological:     Mental Status: He is alert and oriented to person, place, and time.  Psychiatric:  Mood and Affect: Affect normal.    LABORATORY DATA:  I have reviewed the data as listed Lab Results  Component Value Date   WBC 5.1 02/29/2024   HGB 12.6 (L) 02/29/2024   HCT 36.6 (L) 02/29/2024   MCV 96.8 02/29/2024   PLT 189 02/29/2024   Recent Labs    01/24/24 0843 02/15/24 1435 02/29/24 0832  NA 141 140 140  K 3.8 3.9 4.0  CL 106 105 106  CO2 23 26 26   GLUCOSE 107* 99 96  BUN 15 18 23   CREATININE 1.07 0.91 1.05  CALCIUM  9.0 9.0 9.1  GFRNONAA >60 >60 >60  PROT 6.3* 6.2* 6.0*  ALBUMIN 4.1 4.2 4.1  AST 27 22 19   ALT 22 23 17   ALKPHOS 61 68 71  BILITOT 0.9 1.0 0.6    RADIOGRAPHIC STUDIES: I have personally reviewed the radiological images as listed and agreed with the findings in the report. NM PET Image Restage (PS) Skull Base to Thigh (F-18 FDG) Result Date: 02/16/2024 CLINICAL DATA:  Follow up treatment strategy for B-cell lymphoma. EXAM: NUCLEAR MEDICINE PET SKULL BASE TO THIGH TECHNIQUE: 12.76 mCi F-18 FDG was injected intravenously. Full-ring PET imaging was performed from the skull base to thigh after the radiotracer. CT data was obtained and used for attenuation correction and anatomic localization. Fasting blood glucose: 82 mg/dl COMPARISON:  PET-CT 46/27/0350. FINDINGS: Mediastinal blood pool activity: SUV max 3.2 Liver activity: SUV max 3.7 NECK: Previously there are extensive hypermetabolic nodes throughout the neck which have markedly improved since the prior. Specific lesions will be compared for continuity. The level 2 lymph node on the right which had maximum SUV value of 17.66 and measured 1.6 cm, today on CT image 26 measures 3 mm and has maximum SUV value of 2.5. Level 4 node on the right which measured 16 mm in short axis with SUV of 23.93, today  has short axis measurement 5 mm on series 6, image 34 and has no appreciable abnormal uptake. Uptake seen along the tonsils, base of tongue and along the nasopharynx has markedly improved as well. Previous areas of maximum SUV value of up to 21.85, today has minimal tonsillar uptake of maximum SUV of 4.3. No new hypermetabolic nodes identified. There is some mild uptake along the cervical spine which based on appearance is likely degenerative in nature. Near symmetric uptake along the visualized intracranial compartment. Incidental CT findings: Visualized portions of the paranasal sinuses and mastoids are clear air cells are clear except for some rounded opacity along the left maxillary sinus inferiorly, possible mucous retention cyst or polyp. Presumed sebaceous cyst laterally along the left neck on image 15. The parotid glands, submandibular glands and thyroid glands unremarkable. Scattered vascular calcifications. CHEST: Previously there are extensive hypermetabolic nodes throughout the chest including mediastinal, hilar and axillary regions. These have markedly improved. Centrally no abnormal uptake above mediastinal blood pool today. Specific lesions will be followed for continuity. Example right paratracheal which measured 11 mm in short axis on the prior with maximum SUV value of 26.8, today has short axis on image 53 of 5 mm. Maximum SUV value of 2.4. Right internal mammary chain node measuring 12 mm on the previous examination, today 5 mm in short axis. Series 6, image 60. No abnormal uptake. Previous 10.69. Left axillary node which previously measured 16 mm with maximum SUV value of 26.3 today has short axis dimension on image 62 of 6 mm. Maximum SUV 1.1. No new areas of abnormal radiotracer uptake  along nodes in the neck nor along the lung parenchyma. Incidental CT findings: Coronary artery calcifications are seen. Borderline size heart. No pericardial effusion. Scattered vascular calcifications along the  thoracic aorta. Normal caliber thoracic esophagus. Breathing motion. No consolidation, pneumothorax or effusion. ABDOMEN/PELVIS: Previously the there is splenomegaly with numerous small hypermetabolic splenic lesions. These lesions are no longer hypermetabolic. The size is spleen is also decreased. Spleen measured 21 cm in cephalocaudal length on the previous examination and today 16.8 cm. Still enlarged but improved. In addition there are numerous hypermetabolic nodes throughout the mesentery and retroperitoneum which have resolved there abnormal uptake. Few lesions will be followed for continuity. Portacaval node which measured 14 mm in AP diameter on the previous, today on image 94 measures 9 mm. Left para-aortic node measuring 10 mm on the prior, today on image 105 measures 6 mm. All the mesenteric nodes are normal in size today. Left external iliac chain node measured 12 mm, today on image 134 measures 5 mm. Incidental CT findings: Colonic diverticulosis. No bowel obstruction. No free air or free fluid. Normal appendix. Diffuse vascular calcifications. Bilateral renal stones are once again identified. The larger left-sided focus measuring once again 14 mm. No definite ureteral stones. Previous cholecystectomy. Small calcification along the margin of the right hepatic lobe posterior medially. SKELETON: No abnormal uptake along the visualized osseous structures. Curvature of spine with degenerative changes. IMPRESSION: Marked improvement in distribution of disease. All of the previous hypermetabolic nodes have resolved there activity and are markedly improved by size. In addition the spleen is decreased in size from previous but still enlarged. The splenic lesions also have resolved there abnormal activity. Deauville 1 No new hypermetabolic lesions identified. No abnormal uptake along the lungs at this time. Electronically Signed   By: Adrianna Horde M.D.   On: 02/16/2024 11:10     ASSESSMENT & PLAN:    Lymphoma of lymph nodes (HCC) # Chemo refractory/ Relapsed diffuse large B-cell lymphoma-ABC subtype; IV [spleen]; NOV 2021- CART therapy. DEC 30th, 2024- RECURRENT/RELPASED.  # JAN 2025-neck lymph node biopsy recurrent diffuse B-cell lymphoma-ABC subtype.   Patient currently on  bridging therapy  with-POLA+ rituximab  infusion.  Patient noted -NOT A CANDIDATE REPEAT CAR-T Clinical trial at [facility]. Awaiting allo-stem cell transplant evaluation in MAY 2025.    # February 14, 2024-Marked improvement in distribution of disease. All of the previous hypermetabolic nodes have resolved there activity and are markedly improved by size. In addition the spleen is decreased in size from previous but still enlarged. The splenic lesions also have resolved there abnormal activity. Deauville 1.   Discussed with Dr. Rennie Casco.  # Plan to proceed with Pola-Rituxan  # 3 awaiting further confirmation at [facility]- allo stem cell transplant  # Hemolytic anemia -[sec to DLBCL -dec 2024] Hb 13 today- monitor for now.   # Bilateral leg swelling: sec to fluid- retention/ weight gain from steroids [no CHF] s/p  lasix / Kdur - improved STOP lasix / Kdur   # Borderline LOW EF of 45-50%; Sep Echo 50-55% [Dr.Gollan]- on coreg  BID  Dr. Gollan-JAN 29th, 2025- 2D echo- 60-65%-  stable.   # Infectious Disease/Prophylaxis: will re-start valtrex /Bactrim  DS.   # Cirrhosis- based Imaging [No decompensation]-no prior history of hepatitis B or C.  # High risk of tumor lysis/  elevated uric acid-10- [ Hx of rash to allopurinol ]-currently on febuxostat   80 mg a day.   # PIV- s/p port explanation- pt declined port at this time. Will defer to [facility].   #  ACP: Patient has stage IV disease which is usually incurable.  However, patient is currently doing well without any evidence of progression.  Patient interested in continue current scope of therapy; and full code.   PS # DISPOSITION:  # chemo today # Follow up in 3 weeks- MD; labs- cbc/cmp; LDH-  Dr.B  # I reviewed the blood work- with the patient in detail; also reviewed the imaging independently [as summarized above]; and with the patient in detail.    All questions were answered. The patient knows to call the clinic with any problems, questions or concerns.    Gwyn Leos, MD 02/29/2024 9:17 AM

## 2024-02-29 NOTE — Progress Notes (Signed)
 Patient here for follow up. Denies any concerns today.

## 2024-02-29 NOTE — Assessment & Plan Note (Addendum)
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 DEC 30th, 2024- RECURRENT/RELPASED.  # JAN 2025-neck lymph node biopsy recurrent diffuse B-cell lymphoma-ABC subtype.   Patient currently on  bridging therapy  with-POLA+ rituximab  infusion.  Patient noted -NOT A CANDIDATE REPEAT CAR-T Clinical trial at [facility]. Awaiting allo-stem cell transplant evaluation in MAY 2025.    # February 14, 2024-Marked improvement in distribution of disease. All of the previous hypermetabolic nodes have resolved there activity and are markedly improved by size. In addition the spleen is decreased in size from previous but still enlarged. The splenic lesions also have resolved there abnormal activity. Deauville 1.   Discussed with Dr. Rennie Casco.  # Plan to proceed with Pola-Rituxan  # 3 awaiting further confirmation at [facility]- allo stem cell transplant  # Hemolytic anemia -[sec to DLBCL -dec 2024] Hb 13 today- monitor for now.   # Bilateral leg swelling: sec to fluid- retention/ weight gain from steroids [no CHF] s/p  lasix / Kdur - improved STOP lasix / Kdur   # Borderline LOW EF of 45-50%; Sep Echo 50-55% [Dr.Gollan]- on coreg  BID  Dr. Gollan-JAN 29th, 2025- 2D echo- 60-65%-  stable.   # Infectious Disease/Prophylaxis: will re-start valtrex /Bactrim  DS.   # Cirrhosis- based Imaging [No decompensation]-no prior history of hepatitis B or C.  # High risk of tumor lysis/  elevated uric acid-10- [ Hx of rash to allopurinol ]-currently on febuxostat   80 mg a day.   # PIV- s/p port explanation- pt declined port at this time. Will defer to [facility].   # ACP: Patient has stage IV disease which is usually incurable.  However, patient is currently doing well without any evidence of progression.  Patient interested in continue current scope of therapy; and full code.   PS # DISPOSITION:  # chemo today # Follow up in 3 weeks- MD; labs- cbc/cmp; [facility]- Dr.B  # I reviewed the blood work-  with the patient in detail; also reviewed the imaging independently [as summarized above]; and with the patient in detail.

## 2024-03-01 ENCOUNTER — Other Ambulatory Visit: Payer: Self-pay

## 2024-03-05 ENCOUNTER — Encounter: Payer: Self-pay | Admitting: Internal Medicine

## 2024-03-06 DIAGNOSIS — C8330 Diffuse large B-cell lymphoma, unspecified site: Secondary | ICD-10-CM | POA: Diagnosis not present

## 2024-03-10 ENCOUNTER — Other Ambulatory Visit: Payer: Self-pay

## 2024-03-20 ENCOUNTER — Inpatient Hospital Stay

## 2024-03-20 ENCOUNTER — Inpatient Hospital Stay: Attending: Internal Medicine

## 2024-03-20 ENCOUNTER — Inpatient Hospital Stay (HOSPITAL_BASED_OUTPATIENT_CLINIC_OR_DEPARTMENT_OTHER): Admitting: Nurse Practitioner

## 2024-03-20 ENCOUNTER — Other Ambulatory Visit: Payer: Self-pay | Admitting: *Deleted

## 2024-03-20 VITALS — BP 124/54 | HR 54 | Temp 98.2°F | Wt 292.8 lb

## 2024-03-20 VITALS — HR 66

## 2024-03-20 DIAGNOSIS — M7989 Other specified soft tissue disorders: Secondary | ICD-10-CM | POA: Insufficient documentation

## 2024-03-20 DIAGNOSIS — Z5112 Encounter for antineoplastic immunotherapy: Secondary | ICD-10-CM

## 2024-03-20 DIAGNOSIS — C8590 Non-Hodgkin lymphoma, unspecified, unspecified site: Secondary | ICD-10-CM | POA: Diagnosis not present

## 2024-03-20 DIAGNOSIS — C83398 Diffuse large b-cell lymphoma of other extranodal and solid organ sites: Secondary | ICD-10-CM | POA: Diagnosis not present

## 2024-03-20 DIAGNOSIS — C8330 Diffuse large B-cell lymphoma, unspecified site: Secondary | ICD-10-CM

## 2024-03-20 DIAGNOSIS — K7460 Unspecified cirrhosis of liver: Secondary | ICD-10-CM | POA: Diagnosis not present

## 2024-03-20 LAB — CBC WITH DIFFERENTIAL (CANCER CENTER ONLY)
Abs Immature Granulocytes: 0.12 10*3/uL — ABNORMAL HIGH (ref 0.00–0.07)
Basophils Absolute: 0 10*3/uL (ref 0.0–0.1)
Basophils Relative: 1 %
Eosinophils Absolute: 0.2 10*3/uL (ref 0.0–0.5)
Eosinophils Relative: 5 %
HCT: 37.5 % — ABNORMAL LOW (ref 39.0–52.0)
Hemoglobin: 13.2 g/dL (ref 13.0–17.0)
Immature Granulocytes: 2 %
Lymphocytes Relative: 11 %
Lymphs Abs: 0.6 10*3/uL — ABNORMAL LOW (ref 0.7–4.0)
MCH: 33.2 pg (ref 26.0–34.0)
MCHC: 35.2 g/dL (ref 30.0–36.0)
MCV: 94.2 fL (ref 80.0–100.0)
Monocytes Absolute: 0.5 10*3/uL (ref 0.1–1.0)
Monocytes Relative: 9 %
Neutro Abs: 3.5 10*3/uL (ref 1.7–7.7)
Neutrophils Relative %: 72 %
Platelet Count: 156 10*3/uL (ref 150–400)
RBC: 3.98 MIL/uL — ABNORMAL LOW (ref 4.22–5.81)
RDW: 16.4 % — ABNORMAL HIGH (ref 11.5–15.5)
WBC Count: 4.9 10*3/uL (ref 4.0–10.5)
nRBC: 0 % (ref 0.0–0.2)

## 2024-03-20 LAB — CMP (CANCER CENTER ONLY)
ALT: 20 U/L (ref 0–44)
AST: 21 U/L (ref 15–41)
Albumin: 3.9 g/dL (ref 3.5–5.0)
Alkaline Phosphatase: 73 U/L (ref 38–126)
Anion gap: 9 (ref 5–15)
BUN: 19 mg/dL (ref 8–23)
CO2: 25 mmol/L (ref 22–32)
Calcium: 8.9 mg/dL (ref 8.9–10.3)
Chloride: 107 mmol/L (ref 98–111)
Creatinine: 1.03 mg/dL (ref 0.61–1.24)
GFR, Estimated: >60 mL/min (ref >60)
Glucose, Bld: 104 mg/dL — ABNORMAL HIGH (ref 70–99)
Potassium: 3.9 mmol/L (ref 3.5–5.1)
Sodium: 141 mmol/L (ref 135–145)
Total Bilirubin: 0.8 mg/dL (ref 0.0–1.2)
Total Protein: 5.9 g/dL — ABNORMAL LOW (ref 6.5–8.1)

## 2024-03-20 LAB — LACTATE DEHYDROGENASE: LDH: 192 U/L (ref 98–192)

## 2024-03-20 MED ORDER — SODIUM CHLORIDE 0.9 % IV SOLN
1.8000 mg/kg | Freq: Once | INTRAVENOUS | Status: AC
  Administered 2024-03-20: 230 mg via INTRAVENOUS
  Filled 2024-03-20: qty 7

## 2024-03-20 MED ORDER — SODIUM CHLORIDE 0.9 % IV SOLN
375.0000 mg/m2 | Freq: Once | INTRAVENOUS | Status: AC
  Administered 2024-03-20: 1000 mg via INTRAVENOUS
  Filled 2024-03-20: qty 100

## 2024-03-20 MED ORDER — PROCHLORPERAZINE MALEATE 10 MG PO TABS
10.0000 mg | ORAL_TABLET | Freq: Once | ORAL | Status: AC
  Administered 2024-03-20: 10 mg via ORAL
  Filled 2024-03-20: qty 1

## 2024-03-20 MED ORDER — ACETAMINOPHEN 325 MG PO TABS
650.0000 mg | ORAL_TABLET | Freq: Once | ORAL | Status: AC
  Administered 2024-03-20: 650 mg via ORAL
  Filled 2024-03-20: qty 2

## 2024-03-20 MED ORDER — DEXAMETHASONE SODIUM PHOSPHATE 10 MG/ML IJ SOLN
4.0000 mg | Freq: Once | INTRAMUSCULAR | Status: AC
  Administered 2024-03-20: 4 mg via INTRAVENOUS
  Filled 2024-03-20: qty 1

## 2024-03-20 MED ORDER — DIPHENHYDRAMINE HCL 25 MG PO CAPS
50.0000 mg | ORAL_CAPSULE | Freq: Once | ORAL | Status: AC
  Administered 2024-03-20: 50 mg via ORAL
  Filled 2024-03-20: qty 2

## 2024-03-20 MED ORDER — SODIUM CHLORIDE 0.9 % IV SOLN
INTRAVENOUS | Status: DC
Start: 2024-03-20 — End: 2024-03-20
  Filled 2024-03-20: qty 250

## 2024-03-20 NOTE — Addendum Note (Signed)
 Addended by: Shalece Staffa G on: 03/20/2024 03:59 PM   Modules accepted: Orders

## 2024-03-20 NOTE — Progress Notes (Signed)
 Informed Kenney Peacemaker, NP of patient's noted feelings of Hopelessness

## 2024-03-20 NOTE — Progress Notes (Addendum)
 [facility] CONSULT NOTE  Patient Care Team: Lamon Pillow, MD as PCP - General (Family Medicine) Marquita Situ, Magali Schmitz, MD (General Surgery) Gwyn Leos, MD as Consulting Physician (Internal Medicine)  CHIEF COMPLAINTS/PURPOSE OF CONSULTATION: Diffuse large B-cell lymphoma  #  Oncology History Overview Note  #June 29, 2019-CT- bulky bilateral neck adenopathy left more than right; September 2020-Diffuse B-cell lymphoma; ABC subtype; NEGATIVE FISH studies.  PET scan -bulky adenopathy involving neck /lymphadenopathy mediastinum /abdomen/pelvis inguinal lymph nodes; splenic involvement.  Question bone.  Stage IV [spleen involvement; no bone marrow biopsy].  BCL-2: Positive /BCL 6: Positive /MUM-1: Positive /CD10: Negative 'Ki-67: High proliferation index (90%) /CD30: Negative /Cyclin D1: Negative C-MYC: Equivocal (20% staining seen on limited and fragmented tissue) /EBER: Negative   # Ritux-Pred- 9/11; 9/18- R-COP [held adria- sec to EF 45-50%]  #May 2021-PET scan left neck recurrent disease; excisional biopsy [Dr. Vaught]-recurrent diffuse left B-cell lymphoma  #November 2020-bilateral PE; on xarelto   #J07/07/2020- 2021- Gem-CarboDex [II opinion [facility]; Dr. Rennie Casco; stem cell transplant eval]; no Rituxan -repeat CD20 negative.   #S/p 2 cycles-gem carbo Dex-June 18, 2020 PET scan-slight increase in SUV uptake of the left cervical lymph nodes; no distant metastatic disease.  Discontinued chemotherapy; refer to CAR-T;  OCT 12th 2021- [facility]- -New extensive cervical, supraclavicular, intrathoracic, axillary and abdominal and pelvic lymph nodes, worrisome for lymphoma involvement. New splenomegaly with new, diffuse uptake, now more intense than the liver, concerning for lymphoma involvement. S/p POLA-BENDA  # CAR-T/Axi-cel on 09/16/20 preceded by fludarabine /cyclophosphamide  lymphodepletion.[[facility]; Dr.Grover]; complicated by CRS-G-3 [ICU]; dec 27th-PET CR.  # JAN 2025- CT/PET  scan-widespread lymphadenopathy; splenic enlargement concerning for recurrent lymphoma  # JAN 16th, 2025- LN Bx-1. Lymph node, biopsy, Right supraclavicular :      -  FINDINGS CONSISTENT WITH RECURRENT/PERSISTENT DIFFUSE LARGE B-CELL LYMPHOMA.      NOTE: THE THIN NEEDLE CORE BIOPSIES CONSISTS OF SHEETS OF MODERATE TO LARGE      SIZED CD20 POSITIVE B-CELL THAT COEXPRESS MUM1, BCL-2 AND BCL6 AND ARE NEGATIVE      FOR CD10 CONSISTENT WITH A POST GERMINAL CENTER/ACTIVATED IMMUNOPHENOTYPE.      CD3/CD5 HIGHLIGHTS SMALL BACKGROUND T CELLS.  CD23 IS NEGATIVE FOR A FOLLICULAR      DENDRITIC CELL MESHWORK.  THE PROLIFERATION RATE IS HIGH AT APPROXIMATELY 75% BY      KI-67.  FLOW CYTOMETRY IDENTIFIES STRONGLY KAPPA RESTRICTED B CELLS WITH A      NONSPECIFIC IMMUNOPHENOTYPE (CD5/CD10 NEGATIVE) WITH APPARENT LARGE SIZE BY      FORWARD SCATTER   # JAN 28th, 2025- Pola-Rituxan  [bridging]   # Sep 2020- Borderline low EF/Left ventricular hypokinesis [Dr.Gollan]-s October 9th stress test-nonischemic;   # DIAGNOSIS: DLBCL     Lymphoma of lymph nodes (HCC)  07/03/2019 Initial Diagnosis   Lymphoma of lymph nodes (HCC)   07/13/2019 - 12/07/2019 Chemotherapy   The patient had palonosetron  (ALOXI ) injection 0.25 mg, 0.25 mg, Intravenous,  Once, 7 of 7 cycles Administration: 0.25 mg (07/20/2019), 0.25 mg (08/13/2019), 0.25 mg (11/13/2019), 0.25 mg (09/03/2019), 0.25 mg (10/02/2019), 0.25 mg (10/23/2019), 0.25 mg (12/04/2019) pegfilgrastim  (NEULASTA  ONPRO KIT) injection 6 mg, 6 mg, Subcutaneous, Once, 2 of 2 cycles Administration: 6 mg (10/04/2019), 6 mg (10/25/2019) pegfilgrastim -cbqv (UDENYCA ) injection 6 mg, 6 mg, Subcutaneous, Once, 5 of 5 cycles Administration: 6 mg (07/23/2019), 6 mg (08/16/2019), 6 mg (09/06/2019), 6 mg (11/16/2019), 6 mg (12/07/2019) vinCRIStine  (ONCOVIN ) 2 mg in sodium chloride  0.9 % 50 mL chemo infusion, 2 mg, Intravenous,  Once,  7 of 7 cycles Administration: 2 mg (07/20/2019), 2 mg (08/13/2019), 2  mg (11/13/2019), 2 mg (09/03/2019), 2 mg (10/02/2019), 2 mg (10/23/2019), 2 mg (12/04/2019) cyclophosphamide  (CYTOXAN ) 1,900 mg in sodium chloride  0.9 % 250 mL chemo infusion, 750 mg/m2 = 1,900 mg, Intravenous,  Once, 7 of 7 cycles Administration: 1,900 mg (07/20/2019), 1,900 mg (08/13/2019), 2,000 mg (11/13/2019), 1,900 mg (09/03/2019), 2,000 mg (10/02/2019), 2,000 mg (10/23/2019), 2,000 mg (12/04/2019) etoposide  (VEPESID ) 250 mg in sodium chloride  0.9 % 1,000 mL chemo infusion, 100 mg/m2 = 250 mg, Intravenous,  Once, 6 of 6 cycles Dose modification: 50 mg/m2 (original dose 100 mg/m2, Cycle 3, Reason: Provider Judgment) Administration: 130 mg (08/14/2019), 130 mg (08/15/2019), 130 mg (09/03/2019), 130 mg (09/04/2019), 130 mg (09/05/2019), 130 mg (10/03/2019), 130 mg (10/04/2019), 130 mg (10/24/2019), 130 mg (10/25/2019), 130 mg (11/13/2019), 130 mg (11/14/2019), 130 mg (11/15/2019), 130 mg (10/02/2019), 130 mg (10/23/2019), 130 mg (12/04/2019), 130 mg (12/05/2019), 130 mg (12/06/2019) dexamethasone  (DECADRON ) 12 mg in sodium chloride  0.9 % 145 mL IVPB, , Intravenous,  Once, 8 of 8 cycles Administration:  (07/13/2019),  (07/20/2019),  (08/13/2019),  (11/13/2019),  (09/03/2019),  (10/02/2019),  (10/23/2019),  (12/04/2019) riTUXimab -pvvr (RUXIENCE ) 900 mg in sodium chloride  0.9 % 250 mL (2.6471 mg/mL) infusion, 375 mg/m2 = 900 mg (100 % of original dose 375 mg/m2), Intravenous,  Once, 1 of 1 cycle Dose modification: 375 mg/m2 (original dose 375 mg/m2, Cycle 1) Administration: 900 mg (07/13/2019)  for chemotherapy treatment.    05/09/2020 - 06/06/2020 Chemotherapy   The patient had palonosetron  (ALOXI ) injection 0.25 mg, 0.25 mg, Intravenous,  Once, 2 of 4 cycles Administration: 0.25 mg (05/09/2020), 0.25 mg (05/30/2020) CARBOplatin  (PARAPLATIN ) 750 mg in sodium chloride  0.9 % 250 mL chemo infusion, 750 mg (100 % of original dose 750 mg), Intravenous,  Once, 2 of 4 cycles Dose modification:   (original dose 750 mg, Cycle 1, Reason: Provider  Judgment, Comment: labs from care every where. ) Administration: 750 mg (05/09/2020), 750 mg (05/30/2020) gemcitabine  (GEMZAR ) 2,500 mg in sodium chloride  0.9 % 250 mL chemo infusion, 2,584 mg, Intravenous,  Once, 2 of 4 cycles Administration: 2,500 mg (05/09/2020), 2,500 mg (05/16/2020), 2,500 mg (05/30/2020), 2,500 mg (06/06/2020) fosaprepitant  (EMEND ) 150 mg in sodium chloride  0.9 % 145 mL IVPB, 150 mg, Intravenous,  Once, 2 of 4 cycles Administration: 150 mg (05/09/2020), 150 mg (05/30/2020)  for chemotherapy treatment.    08/27/2020 - 08/27/2020 Chemotherapy   Patient is on Treatment Plan : NON-HODGKINS LYMPHOMA RELAPSED/ REFRACTORY DLBCL Polatuzumab + Bendamustine  + Rituximab  q21d x 6 cycles     11/13/2020 Cancer Staging   Staging form: Hodgkin and Non-Hodgkin Lymphoma, AJCC 8th Edition - Clinical: Stage IV (Diffuse large B-cell lymphoma) - Signed by Brahmanday, Govinda R, MD on 11/13/2020   11/29/2023 -  Chemotherapy   Patient is on Treatment Plan : NON-HODGKINS LYMPHOMA RELAPSED/ REFRACTORY DLBCL Polatuzumab D1 ++ Rituximab  D1 q21d x 6 cycles       HISTORY OF PRESENTING ILLNESS: Ambulating independently.  Alone.   Eric [name] 73 y.o. male with relapsed/refractory DLBCL s/p CAR-T at [facility] 09/2020, currently on bridging chemotherapy with POLA-Ritx, who returns to clinic for follow up. In interim, he saw [facility] for consideration of SCT. He's concerned about how he would care for his wife through additional treatments. Also questions potentially low cure rates of SCT. He feels well and is tolerating treatment well. Denies new lumps. No fevers or chills. No interval infections. Denies nausea or vomiting. Denies shortness of breath  or cough.   Review of Systems  Constitutional:  Negative for chills, diaphoresis and fever.  HENT:  Positive for hearing loss. Negative for nosebleeds and sore throat.   Eyes:  Negative for double vision.  Respiratory:  Negative for hemoptysis, sputum production and wheezing.    Cardiovascular:  Positive for leg swelling. Negative for chest pain, palpitations and orthopnea.  Gastrointestinal:  Negative for abdominal pain, blood in stool, constipation, diarrhea, heartburn, melena, nausea and vomiting.  Genitourinary:  Negative for dysuria, frequency and urgency.  Musculoskeletal:  Negative for back pain and joint pain.  Skin: Negative.  Negative for itching and rash.  Neurological:  Negative for dizziness, tingling, focal weakness, weakness and headaches.  Endo/Heme/Allergies:  Does not bruise/bleed easily.  Psychiatric/Behavioral:  Negative for depression. The patient does not have insomnia.     MEDICAL HISTORY:  Past Medical History:  Diagnosis Date   Adopted person    Cancer [facility])    Colon polyps    Pancreatitis    Rectal bleeding 02/24/2014   Sepsis (HCC) 09/18/2019   SURGICAL HISTORY: Past Surgical History:  Procedure Laterality Date   CHOLECYSTECTOMY     COLONOSCOPY  02/28/14   IR IMAGING GUIDED PORT INSERTION  07/18/2019   IR REMOVAL TUN ACCESS W/ PORT W/O FL MOD SED  05/29/2021   LYMPH NODE BIOPSY Left 03/28/2020   Procedure: EXCISION OF SUBMANDIBULAR  LYMPH NODE;  Surgeon: Rogers Clayman, MD;  Location: [facility];  Service: ENT;  Laterality: Left;   POLYPECTOMY     colon polyp removed   TONSILLECTOMY     SOCIAL HISTORY: Social History   Socioeconomic History   Marital status: Married    Spouse name: Not on file   Number of children: Not on file   Years of education: Not on file   Highest education level: 12th grade  Occupational History    Comment: Retired from [employer]  Tobacco Use   Smoking status: Every Day    Types: Cigars   Smokeless tobacco: Never   Tobacco comments:    smoke 1/2 ppd cigaretes since 73 yo. changed to cigars at age July 2004.   Vaping Use   Vaping status: Never Used  Substance and Sexual Activity   Alcohol use: Yes    Comment: 6-7 drinks weekly   Drug use: No   Sexual activity: Not on file  Other  Topics Concern   Not on file  Social History Narrative   Lives at home with wife; in [place]. retd from [employer]. Cigar smoker; drinks liquor. Children - grown up.    Social Drivers of Corporate investment banker Strain: Low Risk  (08/22/2023)   Overall Financial Resource Strain (CARDIA)    Difficulty of Paying Living Expenses: Not hard at all  Food Insecurity: No Food Insecurity (08/22/2023)   Hunger Vital Sign    Worried About Running Out of Food in the Last Year: Never true    Ran Out of Food in the Last Year: Never true  Transportation Needs: No Transportation Needs (08/22/2023)   PRAPARE - Administrator, Civil Service (Medical): No    Lack of Transportation (Non-Medical): No  Physical Activity: Insufficiently Active (08/22/2023)   Exercise Vital Sign    Days of Exercise per Week: 1 day    Minutes of Exercise per Session: 10 min  Stress: No Stress Concern Present (08/22/2023)   [employer] of Occupational Health - Occupational Stress Questionnaire    Feeling of Stress :  Only a little  Social Connections: Moderately Isolated (08/22/2023)   Social Connection and Isolation Panel [NHANES]    Frequency of Communication with Friends and Family: Once a week    Frequency of Social Gatherings with Friends and Family: Twice a week    Attends Religious Services: Never    Database administrator or Organizations: No    Attends Engineer, structural: Not on file    Marital Status: Married  Catering manager Violence: Not on file   FAMILY HISTORY: Family History  Adopted: Yes   ALLERGIES:  is allergic to allopurinol , azithromycin , and losartan  potassium.  MEDICATIONS:  Current Outpatient Medications  Medication Sig Dispense Refill   Multiple Vitamin (MULTI-VITAMIN) tablet Take 1 tablet by mouth daily.     Sulfamethoxazole -Trimethoprim  (BACTRIM  PO) Take 1 tablet by mouth every Monday, Wednesday, and Friday.     valACYclovir  HCl (VALTREX  PO) Take 1 tablet  by mouth 2 (two) times daily.     febuxostat  (ULORIC ) 40 MG tablet Take 40 mg a day x1 week, and then increase to 80 mg a day. (Patient not taking: Reported on 03/20/2024) 60 tablet 1   furosemide  (LASIX ) 20 MG tablet Take 2 pills a day x 7; and then  one a day- with Kdur (Patient not taking: Reported on 03/20/2024) 30 tablet 0   gabapentin  (NEURONTIN ) 100 MG capsule Take 1 capsule (100 mg total) by mouth 3 (three) times daily. (Patient not taking: Reported on 03/20/2024) 90 capsule 0   ondansetron  (ZOFRAN ) 8 MG tablet One pill every 8 hours as needed for nausea/vomitting. (Patient not taking: Reported on 03/20/2024) 40 tablet 1   potassium chloride  SA (KLOR-CON  M) 20 MEQ tablet 1 pill twice a day (Patient not taking: Reported on 03/20/2024) 30 tablet 3   predniSONE  (DELTASONE ) 50 MG tablet Take 2 tablets once day x 5 days. START on day of your chemotherapy. Take in AM; with FOOD. (Patient not taking: Reported on 03/20/2024) 10 tablet 0   prochlorperazine  (COMPAZINE ) 10 MG tablet Take 1 tablet (10 mg total) by mouth every 6 (six) hours as needed for nausea or vomiting. (Patient not taking: Reported on 03/20/2024) 40 tablet 1   No current facility-administered medications for this visit.   PHYSICAL EXAMINATION: ECOG PERFORMANCE STATUS: 1 - Symptomatic but completely ambulatory Vitals:   03/20/24 0832  BP: (!) 124/54  Pulse: (!) 54  Temp: 98.2 F (36.8 C)  SpO2: 100%   Filed Weights   03/20/24 0832  Weight: 292 lb 12.8 oz (132.8 kg)    Physical Exam Vitals reviewed.  Constitutional:      Appearance: He is not ill-appearing.  HENT:     Head: Normocephalic and atraumatic.     Mouth/Throat:     Pharynx: No oropharyngeal exudate.  Eyes:     General: No scleral icterus.    Pupils: Pupils are equal, round, and reactive to light.  Cardiovascular:     Rate and Rhythm: Normal rate and regular rhythm.  Pulmonary:     Effort: Pulmonary effort is normal. No respiratory distress.     Breath  sounds: No wheezing.  Abdominal:     General: There is no distension.     Palpations: Abdomen is soft.     Tenderness: There is no abdominal tenderness. There is no guarding.  Musculoskeletal:        General: No tenderness.  Lymphadenopathy:     Cervical: No cervical adenopathy.  Skin:    General: Skin is warm.  Coloration: Skin is not pale.  Neurological:     Mental Status: He is alert and oriented to person, place, and time.  Psychiatric:        Mood and Affect: Mood and affect normal.        Behavior: Behavior normal.    LABORATORY DATA:  I have reviewed the data as listed Lab Results  Component Value Date   WBC 4.9 03/20/2024   HGB 13.2 03/20/2024   HCT 37.5 (L) 03/20/2024   MCV 94.2 03/20/2024   PLT 156 03/20/2024   Recent Labs    02/15/24 1435 02/29/24 0832 03/20/24 0816  NA 140 140 141  K 3.9 4.0 3.9  CL 105 106 107  CO2 26 26 25   GLUCOSE 99 96 104*  BUN 18 23 19   CREATININE 0.91 1.05 1.03  CALCIUM  9.0 9.1 8.9  GFRNONAA >60 >60 >60  PROT 6.2* 6.0* 5.9*  ALBUMIN 4.2 4.1 3.9  AST 22 19 21   ALT 23 17 20   ALKPHOS 68 71 73  BILITOT 1.0 0.6 0.8    RADIOGRAPHIC STUDIES: I have personally reviewed the radiological images as listed and agreed with the findings in the report. No results found.    ASSESSMENT & PLAN:   Lymphoma of lymph nodes - Chemo refractory/ Relapsed diffuse large B-cell lymphoma- ABC subtype; IV [spleen]; NOV 2021- CART therapy. DEC 30th, 2024- RECURRENT/RELAPSED. JAN 2025- neck lymph node biopsy recurrent diffuse B-cell lymphoma- ABC subtype. Patient currently on  bridging therapy  with-POLA+ rituximab  infusion. Patient noted - NOT A CANDIDATE REPEAT CAR-T Clinical trial at [facility]. Awaiting allo-stem cell transplant evaluation in MAY 2025.     # February 14, 2024- Marked improvement in distribution of disease. All of the previous hypermetabolic nodes have resolved there activity and are markedly improved by size. In addition the spleen is  decreased in size from previous but still enlarged. The splenic lesions also have resolved there abnormal activity. Deauville 1. Discussed with Dr. Rennie Casco.   # He is s/p #3 Pola-Rituxan  # 3. Was seen at [facility] on 03/06/24 for consideration of allo stem cell transplant. Note from Dr Rachel Budds reviewed. It was felt that autoHCT would probably be insufficient at this time secondary to his extensive treatment history of unlikely durability of BEAM conditioning for disease control. Logistics were discussed in great detail and ultimately he was disinclined to pursue translant.  Today we again reviewed some of his questions regarding SCT. For now, plan to continue POLA-Rituximab  and fortunately he is not experiencing neuropathic symptoms. He has an appt tomorrow with Dr Rennie Casco to discuss other options including bispecifics. ctDNA/Clonoseq was sent by [facility]. Labs tody reviewed and acceptable for continuation of treatment. Proceed with Pola-Rituximab  #5 today. Plan to see Dr Valentine Gasmen back in 3 weeks.    # Hemolytic anemia -[sec to DLBCL -dec 2024] Hb 13 today- monitor for now.    # Bilateral leg swelling: sec to fluid- retention/ weight gain from steroids [no CHF] s/p  lasix / Kdur - improved STOP lasix / Kdur    # Borderline LOW EF of 45-50%; Sep Echo 50-55% [Dr.Gollan]- on coreg  BID  Dr. Gollan-JAN 29th, 2025- 2D echo- 60-65%-  stable.    # Infectious Disease/Prophylaxis: Continue valtrex /Bactrim  DS.    # Cirrhosis- based Imaging [No decompensation]-no prior history of hepatitis B or C.   # High risk of tumor lysis/elevated uric acid-10- [ Hx of rash to allopurinol ]-currently on febuxostat   80 mg a day.    # PIV- s/p port  explanation- pt declined port at this time. Will defer to [facility].    # Goals of care: Stage IV disease which we discussed is incurable however, he has been doing well and currently without evidence of progression. He remains full code.   PS  DISPOSITION:  # chemo today # Follow up in 3 weeks-  labs- cbc/cmp/LDH, Dr.B, polatuzumab + rituximab - la  Addendum:  Discussed with [facility]. ctDNA testing pending. If negative, we continue R-pola for 6 cycles and then monitor him thereafter with clonoseq and PET/CT scan. If it's positive, consider adding mosunetuzumab to the pola with the goal of a more durable and deeper response. Will continue to communicate with Dr Rennie Casco and appreciate her input.   No problem-specific Assessment & Plan notes found for this encounter.  All questions were answered. The patient knows to call the clinic with any problems, questions or concerns.    Nelda Balsam, NP 03/20/2024

## 2024-03-21 ENCOUNTER — Other Ambulatory Visit

## 2024-03-21 ENCOUNTER — Ambulatory Visit

## 2024-03-21 ENCOUNTER — Other Ambulatory Visit: Payer: Self-pay

## 2024-03-21 ENCOUNTER — Ambulatory Visit: Admitting: Internal Medicine

## 2024-03-21 DIAGNOSIS — C8330 Diffuse large B-cell lymphoma, unspecified site: Secondary | ICD-10-CM | POA: Diagnosis not present

## 2024-03-22 DIAGNOSIS — C8330 Diffuse large B-cell lymphoma, unspecified site: Secondary | ICD-10-CM | POA: Diagnosis not present

## 2024-03-29 ENCOUNTER — Encounter: Payer: Self-pay | Admitting: Internal Medicine

## 2024-04-01 DIAGNOSIS — C8330 Diffuse large B-cell lymphoma, unspecified site: Secondary | ICD-10-CM | POA: Diagnosis not present

## 2024-04-03 ENCOUNTER — Encounter: Payer: Self-pay | Admitting: Internal Medicine

## 2024-04-06 NOTE — Progress Notes (Signed)
 Patient enrolled in [facility] services on 04/06/2024.  Initial behavioral health evaluation is scheduled for 04/25/2024.

## 2024-04-11 ENCOUNTER — Encounter: Payer: Self-pay | Admitting: Internal Medicine

## 2024-04-11 ENCOUNTER — Inpatient Hospital Stay

## 2024-04-11 ENCOUNTER — Inpatient Hospital Stay (HOSPITAL_BASED_OUTPATIENT_CLINIC_OR_DEPARTMENT_OTHER): Admitting: Internal Medicine

## 2024-04-11 ENCOUNTER — Inpatient Hospital Stay: Attending: Internal Medicine

## 2024-04-11 VITALS — BP 120/72 | HR 61 | Temp 96.0°F | Resp 17

## 2024-04-11 DIAGNOSIS — C83398 Diffuse large b-cell lymphoma of other extranodal and solid organ sites: Secondary | ICD-10-CM | POA: Insufficient documentation

## 2024-04-11 DIAGNOSIS — C8590 Non-Hodgkin lymphoma, unspecified, unspecified site: Secondary | ICD-10-CM

## 2024-04-11 DIAGNOSIS — C8330 Diffuse large B-cell lymphoma, unspecified site: Secondary | ICD-10-CM

## 2024-04-11 DIAGNOSIS — Z5112 Encounter for antineoplastic immunotherapy: Secondary | ICD-10-CM | POA: Diagnosis not present

## 2024-04-11 LAB — CBC WITH DIFFERENTIAL (CANCER CENTER ONLY)
Abs Immature Granulocytes: 0.05 10*3/uL (ref 0.00–0.07)
Basophils Absolute: 0 10*3/uL (ref 0.0–0.1)
Basophils Relative: 1 %
Eosinophils Absolute: 0.1 10*3/uL (ref 0.0–0.5)
Eosinophils Relative: 3 %
HCT: 39.1 % (ref 39.0–52.0)
Hemoglobin: 13.3 g/dL (ref 13.0–17.0)
Immature Granulocytes: 1 %
Lymphocytes Relative: 12 %
Lymphs Abs: 0.5 10*3/uL — ABNORMAL LOW (ref 0.7–4.0)
MCH: 32.4 pg (ref 26.0–34.0)
MCHC: 34 g/dL (ref 30.0–36.0)
MCV: 95.1 fL (ref 80.0–100.0)
Monocytes Absolute: 0.5 10*3/uL (ref 0.1–1.0)
Monocytes Relative: 12 %
Neutro Abs: 2.8 10*3/uL (ref 1.7–7.7)
Neutrophils Relative %: 71 %
Platelet Count: 173 10*3/uL (ref 150–400)
RBC: 4.11 MIL/uL — ABNORMAL LOW (ref 4.22–5.81)
RDW: 17.2 % — ABNORMAL HIGH (ref 11.5–15.5)
WBC Count: 4 10*3/uL (ref 4.0–10.5)
nRBC: 0 % (ref 0.0–0.2)

## 2024-04-11 LAB — CMP (CANCER CENTER ONLY)
ALT: 26 U/L (ref 0–44)
AST: 23 U/L (ref 15–41)
Albumin: 4.2 g/dL (ref 3.5–5.0)
Alkaline Phosphatase: 71 U/L (ref 38–126)
Anion gap: 6 (ref 5–15)
BUN: 24 mg/dL — ABNORMAL HIGH (ref 8–23)
CO2: 25 mmol/L (ref 22–32)
Calcium: 8.8 mg/dL — ABNORMAL LOW (ref 8.9–10.3)
Chloride: 109 mmol/L (ref 98–111)
Creatinine: 1.13 mg/dL (ref 0.61–1.24)
GFR, Estimated: >60 mL/min (ref >60)
Glucose, Bld: 108 mg/dL — ABNORMAL HIGH (ref 70–99)
Potassium: 3.9 mmol/L (ref 3.5–5.1)
Sodium: 140 mmol/L (ref 135–145)
Total Bilirubin: 1.1 mg/dL (ref 0.0–1.2)
Total Protein: 6 g/dL — ABNORMAL LOW (ref 6.5–8.1)

## 2024-04-11 LAB — LACTATE DEHYDROGENASE: LDH: 212 U/L — ABNORMAL HIGH (ref 98–192)

## 2024-04-11 MED ORDER — FUROSEMIDE 20 MG PO TABS: ORAL_TABLET | ORAL | 0 refills | Status: DC

## 2024-04-11 MED ORDER — ACETAMINOPHEN 325 MG PO TABS
650.0000 mg | ORAL_TABLET | Freq: Once | ORAL | Status: AC
  Administered 2024-04-11: 650 mg via ORAL
  Filled 2024-04-11: qty 2

## 2024-04-11 MED ORDER — SODIUM CHLORIDE 0.9 % IV SOLN
INTRAVENOUS | Status: DC
  Filled 2024-04-11 (×2): qty 250

## 2024-04-11 MED ORDER — DIPHENHYDRAMINE HCL 25 MG PO CAPS
50.0000 mg | ORAL_CAPSULE | Freq: Once | ORAL | Status: AC
  Administered 2024-04-11: 50 mg via ORAL
  Filled 2024-04-11: qty 2

## 2024-04-11 MED ORDER — POTASSIUM CHLORIDE CRYS ER 20 MEQ PO TBCR: EXTENDED_RELEASE_TABLET | ORAL | 3 refills | Status: DC

## 2024-04-11 MED ORDER — SODIUM CHLORIDE 0.9 % IV SOLN
375.0000 mg/m2 | Freq: Once | INTRAVENOUS | Status: AC
  Administered 2024-04-11: 1000 mg via INTRAVENOUS
  Filled 2024-04-11: qty 100

## 2024-04-11 MED ORDER — DEXAMETHASONE SODIUM PHOSPHATE 10 MG/ML IJ SOLN
4.0000 mg | Freq: Once | INTRAMUSCULAR | Status: AC
  Administered 2024-04-11: 4 mg via INTRAVENOUS
  Filled 2024-04-11: qty 1

## 2024-04-11 MED ORDER — PROCHLORPERAZINE MALEATE 10 MG PO TABS
10.0000 mg | ORAL_TABLET | Freq: Once | ORAL | Status: AC
  Administered 2024-04-11: 10 mg via ORAL
  Filled 2024-04-11: qty 1

## 2024-04-11 MED ORDER — SODIUM CHLORIDE 0.9 % IV SOLN
1.8000 mg/kg | Freq: Once | INTRAVENOUS | Status: AC
  Administered 2024-04-11: 230 mg via INTRAVENOUS
  Filled 2024-04-11: qty 7

## 2024-04-11 NOTE — Progress Notes (Signed)
 I connected with York Henri on 04/11/24 at  8:30 AM EDT by video enabled telemedicine visit and verified that I am speaking with the correct person using two identifiers.  I discussed the limitations, risks, security and privacy concerns of performing an evaluation and management service by telemedicine and the availability of in-person appointments. I also discussed with the patient that there may be a patient responsible charge related to this service. The patient expressed understanding and agreed to proceed.    Other persons participating in the visit and their role in the encounter: RN/medical reconciliation Patient's location: office Provider's location: home  Oncology History Overview Note  #June 29, 2019-CT- bulky bilateral neck adenopathy left more than right; September 2020-Diffuse B-cell lymphoma; ABC subtype; NEGATIVE FISH studies.  PET scan -bulky adenopathy involving neck /lymphadenopathy mediastinum /abdomen/pelvis inguinal lymph nodes; splenic involvement.  Question bone.  Stage IV [spleen involvement; no bone marrow biopsy].  BCL-2: Positive /BCL 6: Positive /MUM-1: Positive /CD10: Negative 'Ki-67: High proliferation index (90%) /CD30: Negative /Cyclin D1: Negative C-MYC: Equivocal (20% staining seen on limited and fragmented tissue) /EBER: Negative   # Ritux-Pred- 9/11; 9/18- R-COP [held adria- sec to EF 45-50%]  #May 2021-PET scan left neck recurrent disease; excisional biopsy [Dr. Vaught]-recurrent diffuse left B-cell lymphoma  #November 2020-bilateral PE; on xarelto   #J07/07/2020- 2021- Gem-CarboDex [II opinion [facility]; Dr. Rennie Casco; stem cell transplant eval]; no Rituxan -repeat CD20 negative.   #S/p 2 cycles-gem carbo Dex-June 18, 2020 PET scan-slight increase in SUV uptake of the left cervical lymph nodes; no distant metastatic disease.  Discontinued chemotherapy; refer to CAR-T;  OCT 12th 2021- [facility]- -New extensive cervical, supraclavicular, intrathoracic, axillary and  abdominal and pelvic lymph nodes, worrisome for lymphoma involvement. New splenomegaly with new, diffuse uptake, now more intense than the liver, concerning for lymphoma involvement. S/p POLA-BENDA  # CAR-T/Axi-cel on 09/16/20 preceded by fludarabine /cyclophosphamide  lymphodepletion.[[facility]; Dr.Grover]; complicated by CRS-G-3 [ICU]; dec 27th-PET CR.  # JAN 2025- CT/PET scan-widespread lymphadenopathy; splenic enlargement concerning for recurrent lymphoma  # JAN 16th, 2025- LN Bx-1. Lymph node, biopsy, Right supraclavicular :      -  FINDINGS CONSISTENT WITH RECURRENT/PERSISTENT DIFFUSE LARGE B-CELL LYMPHOMA.      NOTE: THE THIN NEEDLE CORE BIOPSIES CONSISTS OF SHEETS OF MODERATE TO LARGE      SIZED CD20 POSITIVE B-CELL THAT COEXPRESS MUM1, BCL-2 AND BCL6 AND ARE NEGATIVE      FOR CD10 CONSISTENT WITH A POST GERMINAL CENTER/ACTIVATED IMMUNOPHENOTYPE.      CD3/CD5 HIGHLIGHTS SMALL BACKGROUND T CELLS.  CD23 IS NEGATIVE FOR A FOLLICULAR      DENDRITIC CELL [place].  THE PROLIFERATION RATE IS HIGH AT APPROXIMATELY 75% BY      KI-67.  FLOW CYTOMETRY IDENTIFIES STRONGLY KAPPA RESTRICTED B CELLS WITH A      NONSPECIFIC IMMUNOPHENOTYPE (CD5/CD10 NEGATIVE) WITH APPARENT LARGE SIZE BY      FORWARD SCATTER   # JAN 28th, 2025- Pola-Rituxan  [bridging]   # Sep 2020- Borderline low EF/Left ventricular hypokinesis [Dr.Gollan]-s October 9th stress test-nonischemic;   # DIAGNOSIS: DLBCL     Lymphoma of lymph nodes (HCC)  07/03/2019 Initial Diagnosis   Lymphoma of lymph nodes (HCC)   07/13/2019 - 12/07/2019 Chemotherapy   The patient had palonosetron  (ALOXI ) injection 0.25 mg, 0.25 mg, Intravenous,  Once, 7 of 7 cycles Administration: 0.25 mg (07/20/2019), 0.25 mg (08/13/2019), 0.25 mg (11/13/2019), 0.25 mg (09/03/2019), 0.25 mg (10/02/2019), 0.25 mg (10/23/2019), 0.25 mg (12/04/2019) pegfilgrastim  (NEULASTA  ONPRO KIT) injection 6 mg, 6 mg, Subcutaneous,  Once, 2 of 2 cycles Administration: 6 mg (10/04/2019), 6 mg  (10/25/2019) pegfilgrastim -cbqv (UDENYCA ) injection 6 mg, 6 mg, Subcutaneous, Once, 5 of 5 cycles Administration: 6 mg (07/23/2019), 6 mg (08/16/2019), 6 mg (09/06/2019), 6 mg (11/16/2019), 6 mg (12/07/2019) vinCRIStine  (ONCOVIN ) 2 mg in sodium chloride  0.9 % 50 mL chemo infusion, 2 mg, Intravenous,  Once, 7 of 7 cycles Administration: 2 mg (07/20/2019), 2 mg (08/13/2019), 2 mg (11/13/2019), 2 mg (09/03/2019), 2 mg (10/02/2019), 2 mg (10/23/2019), 2 mg (12/04/2019) cyclophosphamide  (CYTOXAN ) 1,900 mg in sodium chloride  0.9 % 250 mL chemo infusion, 750 mg/m2 = 1,900 mg, Intravenous,  Once, 7 of 7 cycles Administration: 1,900 mg (07/20/2019), 1,900 mg (08/13/2019), 2,000 mg (11/13/2019), 1,900 mg (09/03/2019), 2,000 mg (10/02/2019), 2,000 mg (10/23/2019), 2,000 mg (12/04/2019) etoposide  (VEPESID ) 250 mg in sodium chloride  0.9 % 1,000 mL chemo infusion, 100 mg/m2 = 250 mg, Intravenous,  Once, 6 of 6 cycles Dose modification: 50 mg/m2 (original dose 100 mg/m2, Cycle 3, Reason: Provider Judgment) Administration: 130 mg (08/14/2019), 130 mg (08/15/2019), 130 mg (09/03/2019), 130 mg (09/04/2019), 130 mg (09/05/2019), 130 mg (10/03/2019), 130 mg (10/04/2019), 130 mg (10/24/2019), 130 mg (10/25/2019), 130 mg (11/13/2019), 130 mg (11/14/2019), 130 mg (11/15/2019), 130 mg (10/02/2019), 130 mg (10/23/2019), 130 mg (12/04/2019), 130 mg (12/05/2019), 130 mg (12/06/2019) dexamethasone  (DECADRON ) 12 mg in sodium chloride  0.9 % 145 mL IVPB, , Intravenous,  Once, 8 of 8 cycles Administration:  (07/13/2019),  (07/20/2019),  (08/13/2019),  (11/13/2019),  (09/03/2019),  (10/02/2019),  (10/23/2019),  (12/04/2019) riTUXimab -pvvr (RUXIENCE ) 900 mg in sodium chloride  0.9 % 250 mL (2.6471 mg/mL) infusion, 375 mg/m2 = 900 mg (100 % of original dose 375 mg/m2), Intravenous,  Once, 1 of 1 cycle Dose modification: 375 mg/m2 (original dose 375 mg/m2, Cycle 1) Administration: 900 mg (07/13/2019)  for chemotherapy treatment.    05/09/2020 - 06/06/2020 Chemotherapy   The  patient had palonosetron  (ALOXI ) injection 0.25 mg, 0.25 mg, Intravenous,  Once, 2 of 4 cycles Administration: 0.25 mg (05/09/2020), 0.25 mg (05/30/2020) CARBOplatin  (PARAPLATIN ) 750 mg in sodium chloride  0.9 % 250 mL chemo infusion, 750 mg (100 % of original dose 750 mg), Intravenous,  Once, 2 of 4 cycles Dose modification:   (original dose 750 mg, Cycle 1, Reason: Provider Judgment, Comment: labs from care every where. ) Administration: 750 mg (05/09/2020), 750 mg (05/30/2020) gemcitabine  (GEMZAR ) 2,500 mg in sodium chloride  0.9 % 250 mL chemo infusion, 2,584 mg, Intravenous,  Once, 2 of 4 cycles Administration: 2,500 mg (05/09/2020), 2,500 mg (05/16/2020), 2,500 mg (05/30/2020), 2,500 mg (06/06/2020) fosaprepitant  (EMEND ) 150 mg in sodium chloride  0.9 % 145 mL IVPB, 150 mg, Intravenous,  Once, 2 of 4 cycles Administration: 150 mg (05/09/2020), 150 mg (05/30/2020)  for chemotherapy treatment.    08/27/2020 - 08/27/2020 Chemotherapy   Patient is on Treatment Plan : NON-HODGKINS LYMPHOMA RELAPSED/ REFRACTORY DLBCL Polatuzumab + Bendamustine  + Rituximab  q21d x 6 cycles     11/13/2020 Cancer Staging   Staging form: Hodgkin and Non-Hodgkin Lymphoma, AJCC 8th Edition - Clinical: Stage IV (Diffuse large B-cell lymphoma) - Signed by Gwyn Leos, MD on 11/13/2020   11/29/2023 -  Chemotherapy   Patient is on Treatment Plan : NON-HODGKINS LYMPHOMA RELAPSED/ REFRACTORY DLBCL Polatuzumab D1 ++ Rituximab  D1 q21d x 6 cycles       Chief Complaint: Diffuse Large B cell lymphoma   History of present illness:Eric [name] 73 y.o.  male with history of diffuse B-cell lymphoma-recurrence/relapse post CAR-T currently on chemotherapy is here for follow-up  Patient complains of swelling in the legs right more than left.  Gotten worse since coming off Lasix .  Denies any shortness of breath or cough.  He denies any worsening lymph nodes.  Otherwise appetite is good.  No skin rash.  No nausea no vomiting.    Observation/objective: Alert & oriented x 3. In No acute distress.   Assessment and plan: Lymphoma of lymph nodes (HCC) # Chemo refractory/ Relapsed diffuse large B-cell lymphoma-ABC subtype; IV [spleen]; NOV 2021- CART therapy. DEC 30th, 2024- RECURRENT/RELPASED.  # JAN 2025-neck lymph node biopsy recurrent diffuse B-cell lymphoma-ABC subtype.   Patient currently on  bridging therapy  with-POLA+ rituximab  infusion.  Patient noted -NOT A CANDIDATE REPEAT CAR-T Clinical trial at [facility]. Awaiting allo-stem cell transplant evaluation in MAY 2025.    # February 14, 2024-Marked improvement in distribution of disease. All of the previous hypermetabolic nodes have resolved there activity and are markedly improved by size. In addition the spleen is decreased in size from previous but still enlarged. The splenic lesions also have resolved there abnormal activity. Deauville 1.     # Plan to proceed with Pola-Rituxan  # 6. Will get PET a morning patient has declined moving forward with alloSCT due to his age, social/home factors, and that fact that he is currently in remission.  He understands unfortunately his lymphoma will eventually progress.     The plan is if ctDNA testing is negative, will plan to treat with 6 cycles of polatuzumab-rituximab  and follow closely for relapse and treat with BITE at that time. If ctDNA testing is positive, would plan to add mosunetuzumab to polatuzumab with the goal of getting deeper remission.   # Hemolytic anemia -[sec to DLBCL -dec 2024] Hb 13 today- monitor for now.   # Right more than left- bilateral leg swelling: sec to fluid- retention/ weight gain from steroids [no CHF]-restart Lasix  potassium.  February 2025-ultrasound negative for any DVT-   # Borderline LOW EF of 45-50%; Sep Echo 50-55% [Dr.Gollan]- on coreg  BID  Dr. Gollan-JAN 29th, 2025- 2D echo- 60-65%-  stable.   # Infectious Disease/Prophylaxis: continuevaltrex/Bactrim  DS.   # Cirrhosis- based Imaging [No  decompensation]-no prior history of hepatitis B or C. stable.   # High risk of tumor lysis/  elevated uric acid-10- [ Hx of rash to allopurinol ]-currently on febuxostat   80 mg a day.   # PIV- s/p port explanation- pt declined port at this time.   # ACP: full code.  PS # DISPOSITION:  # chemo today # Follow up in 4 weeks- MD; labs- cbc/cmp; LDH; no chemo- PET scan- Dr.B  # I reviewed the blood work- with the patient in detail; also reviewed the imaging independently [as summarized above]; and with the patient in detail.   Follow-up instructions:  I discussed the assessment and treatment plan with the patient.  The patient was provided an opportunity to ask questions and all were answered.  The patient agreed with the plan and demonstrated understanding of instructions.  The patient was advised to call back or seek an in person evaluation if the symptoms worsen or if the condition fails to improve as anticipated.    Dr. Riaz Onorato [facility] at [facility] 04/11/2024 9:41 AM

## 2024-04-11 NOTE — Assessment & Plan Note (Addendum)
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 DEC 30th, 2024- RECURRENT/RELPASED.  # JAN 2025-neck lymph node biopsy recurrent diffuse B-cell lymphoma-ABC subtype.   Patient currently on  bridging therapy  with-POLA+ rituximab  infusion.  Patient noted -NOT A CANDIDATE REPEAT CAR-T Clinical trial at [facility]. Awaiting allo-stem cell transplant evaluation in MAY 2025.    # February 14, 2024-Marked improvement in distribution of disease. All of the previous hypermetabolic nodes have resolved there activity and are markedly improved by size. In addition the spleen is decreased in size from previous but still enlarged. The splenic lesions also have resolved there abnormal activity. Deauville 1.     # Plan to proceed with Pola-Rituxan  # 6. Will get PET a morning patient has declined moving forward with alloSCT due to his age, social/home factors, and that fact that he is currently in remission.  He understands unfortunately his lymphoma will eventually progress.     The plan is if ctDNA testing is negative, will plan to treat with 6 cycles of polatuzumab-rituximab  and follow closely for relapse and treat with BITE at that time. If ctDNA testing is positive, would plan to add mosunetuzumab to polatuzumab with the goal of getting deeper remission.   # Hemolytic anemia -[sec to DLBCL -dec 2024] Hb 13 today- monitor for now.   # Right more than left- bilateral leg swelling: sec to fluid- retention/ weight gain from steroids [no CHF]-restart Lasix  potassium.  February 2025-ultrasound negative for any DVT-   # Borderline LOW EF of 45-50%; Sep Echo 50-55% [Dr.Gollan]- on coreg  BID  Dr. Gollan-JAN 29th, 2025- 2D echo- 60-65%-  stable.   # Infectious Disease/Prophylaxis: continuevaltrex/Bactrim  DS.   # Cirrhosis- based Imaging [No decompensation]-no prior history of hepatitis B or C. stable.   # High risk of tumor lysis/  elevated uric acid-10- [ Hx of rash  to allopurinol ]-currently on febuxostat   80 mg a day.   # PIV- s/p port explanation- pt declined port at this time.   # ACP: full code.  PS # DISPOSITION:  # chemo today # Follow up in 4 weeks- MD; labs- cbc/cmp; LDH; no chemo- PET scan- Dr.B  # I reviewed the blood work- with the patient in detail; also reviewed the imaging independently [as summarized above]; and with the patient in detail.

## 2024-04-11 NOTE — Progress Notes (Signed)
 Pt states he has decided not to go thru with the stem cell transplant. Having a lot of stress at home with wife having Parkinson and being her caregiver. He would like to talk with someone in office today if aval, his referral for this is a month out.  Pt has had some tingle in his finger tips, slight, x2 weeks.  C/o swelling in right leg again x1 month, getting worse, you had given him a fluid pill a few months ago, it helped, he is out. Pants legs are getting tight around the leg, no redness or heat.

## 2024-04-13 ENCOUNTER — Other Ambulatory Visit: Payer: Self-pay

## 2024-04-18 ENCOUNTER — Other Ambulatory Visit: Payer: Self-pay

## 2024-04-30 ENCOUNTER — Ambulatory Visit
Admission: RE | Admit: 2024-04-30 | Discharge: 2024-04-30 | Disposition: A | Discharge status: Home / Self Care | Source: Ambulatory Visit | Attending: Internal Medicine | Admitting: Internal Medicine

## 2024-04-30 DIAGNOSIS — N200 Calculus of kidney: Secondary | ICD-10-CM | POA: Diagnosis not present

## 2024-04-30 DIAGNOSIS — I2510 Atherosclerotic heart disease of native coronary artery without angina pectoris: Secondary | ICD-10-CM | POA: Insufficient documentation

## 2024-04-30 DIAGNOSIS — Z9221 Personal history of antineoplastic chemotherapy: Secondary | ICD-10-CM | POA: Insufficient documentation

## 2024-04-30 DIAGNOSIS — C8590 Non-Hodgkin lymphoma, unspecified, unspecified site: Secondary | ICD-10-CM | POA: Diagnosis not present

## 2024-04-30 DIAGNOSIS — R161 Splenomegaly, not elsewhere classified: Secondary | ICD-10-CM | POA: Insufficient documentation

## 2024-04-30 DIAGNOSIS — I700 Atherosclerosis of aorta: Secondary | ICD-10-CM | POA: Diagnosis not present

## 2024-04-30 LAB — GLUCOSE, CAPILLARY: Glucose-Capillary: 101 mg/dL — ABNORMAL HIGH (ref 70–99)

## 2024-04-30 MED ORDER — FLUDEOXYGLUCOSE F - 18 (FDG) INJECTION
12.5200 | Freq: Once | INTRAVENOUS | Status: AC | PRN
Start: 2024-04-30 — End: 2024-04-30
  Administered 2024-04-30: 12.52 via INTRAVENOUS

## 2024-05-08 NOTE — Telephone Encounter (Signed)
 Spoke with patient - he is not interested in transplant at this time  He has spoken with Dr. Claudean. He also has a lot of stuff going on Will update Dr. Debarah. Total Time Spent: 10 mins Time spent in direct patient care: 5 mins

## 2024-05-11 ENCOUNTER — Encounter: Payer: Self-pay | Admitting: Internal Medicine

## 2024-05-11 ENCOUNTER — Inpatient Hospital Stay: Admitting: Internal Medicine

## 2024-05-11 ENCOUNTER — Inpatient Hospital Stay: Attending: Internal Medicine

## 2024-05-11 VITALS — BP 108/58 | HR 65 | Temp 96.4°F | Resp 19 | Ht 72.0 in | Wt 280.4 lb

## 2024-05-11 DIAGNOSIS — D589 Hereditary hemolytic anemia, unspecified: Secondary | ICD-10-CM | POA: Diagnosis not present

## 2024-05-11 DIAGNOSIS — K7460 Unspecified cirrhosis of liver: Secondary | ICD-10-CM | POA: Diagnosis not present

## 2024-05-11 DIAGNOSIS — C8590 Non-Hodgkin lymphoma, unspecified, unspecified site: Secondary | ICD-10-CM | POA: Diagnosis not present

## 2024-05-11 DIAGNOSIS — R600 Localized edema: Secondary | ICD-10-CM | POA: Diagnosis not present

## 2024-05-11 DIAGNOSIS — C83398 Diffuse large b-cell lymphoma of other extranodal and solid organ sites: Secondary | ICD-10-CM | POA: Insufficient documentation

## 2024-05-11 DIAGNOSIS — R161 Splenomegaly, not elsewhere classified: Secondary | ICD-10-CM | POA: Insufficient documentation

## 2024-05-11 LAB — CBC WITH DIFFERENTIAL (CANCER CENTER ONLY)
Abs Immature Granulocytes: 0 K/uL (ref 0.00–0.07)
Basophils Absolute: 0 K/uL (ref 0.0–0.1)
Basophils Relative: 1 %
Eosinophils Absolute: 0.2 K/uL (ref 0.0–0.5)
Eosinophils Relative: 7 %
HCT: 43.5 % (ref 39.0–52.0)
Hemoglobin: 14.9 g/dL (ref 13.0–17.0)
Immature Granulocytes: 0 %
Lymphocytes Relative: 24 %
Lymphs Abs: 0.5 K/uL — ABNORMAL LOW (ref 0.7–4.0)
MCH: 32.3 pg (ref 26.0–34.0)
MCHC: 34.3 g/dL (ref 30.0–36.0)
MCV: 94.2 fL (ref 80.0–100.0)
Monocytes Absolute: 0.5 K/uL (ref 0.1–1.0)
Monocytes Relative: 22 %
Neutro Abs: 1 K/uL — ABNORMAL LOW (ref 1.7–7.7)
Neutrophils Relative %: 46 %
Platelet Count: 156 K/uL (ref 150–400)
RBC: 4.62 MIL/uL (ref 4.22–5.81)
RDW: 16 % — ABNORMAL HIGH (ref 11.5–15.5)
WBC Count: 2.3 K/uL — ABNORMAL LOW (ref 4.0–10.5)
nRBC: 0 % (ref 0.0–0.2)

## 2024-05-11 LAB — LACTATE DEHYDROGENASE: LDH: 157 U/L (ref 98–192)

## 2024-05-11 LAB — CMP (CANCER CENTER ONLY)
ALT: 26 U/L (ref 0–44)
AST: 24 U/L (ref 15–41)
Albumin: 4.3 g/dL (ref 3.5–5.0)
Alkaline Phosphatase: 73 U/L (ref 38–126)
Anion gap: 8 (ref 5–15)
BUN: 21 mg/dL (ref 8–23)
CO2: 26 mmol/L (ref 22–32)
Calcium: 9.1 mg/dL (ref 8.9–10.3)
Chloride: 106 mmol/L (ref 98–111)
Creatinine: 1.04 mg/dL (ref 0.61–1.24)
GFR, Estimated: >60 mL/min (ref >60)
Glucose, Bld: 120 mg/dL — ABNORMAL HIGH (ref 70–99)
Potassium: 4.1 mmol/L (ref 3.5–5.1)
Sodium: 140 mmol/L (ref 135–145)
Total Bilirubin: 1.1 mg/dL (ref 0.0–1.2)
Total Protein: 6.2 g/dL — ABNORMAL LOW (ref 6.5–8.1)

## 2024-05-11 NOTE — Progress Notes (Signed)
 Patient states he is losing grip in right hand and that his neuropathy is getting a bit worse. Other than that, patient presents well today with no other new or acute concerns.

## 2024-05-11 NOTE — Progress Notes (Signed)
  Cancer Center CONSULT NOTE  Patient Care Team: Gasper Nancyann BRAVO, MD as PCP - General (Family Medicine) [name], [name] ORN, MD (General Surgery) Rennie Cindy SAUNDERS, MD as Consulting Physician (Internal Medicine)  CHIEF COMPLAINTS/PURPOSE OF CONSULTATION: Diffuse large B-cell lymphoma  #  Oncology History Overview Note  #June 29, 2019-CT- bulky bilateral neck adenopathy left more than right; September 2020-Diffuse B-cell lymphoma; ABC subtype; NEGATIVE FISH studies.  PET scan -bulky adenopathy involving neck /lymphadenopathy mediastinum /abdomen/pelvis inguinal lymph nodes; splenic involvement.  Question bone.  Stage IV [spleen involvement; no bone marrow biopsy].  BCL-2: Positive /BCL 6: Positive /MUM-1: Positive /CD10: Negative 'Ki-67: High proliferation index (90%) /CD30: Negative /Cyclin D1: Negative C-MYC: Equivocal (20% staining seen on limited and fragmented tissue) /EBER: Negative   # Ritux-Pred- 9/11; 9/18- R-COP [held adria- sec to EF 45-50%]  #May 2021-PET scan left neck recurrent disease; excisional biopsy [Dr. Vaught]-recurrent diffuse left B-cell lymphoma  #November 2020-bilateral PE; on xarelto   #J07/07/2020- 2021- Gem-CarboDex [II opinion [facility]; Dr. Claudean; stem cell transplant eval]; no Rituxan -repeat CD20 negative.   #S/p 2 cycles-gem carbo Dex-June 18, 2020 PET scan-slight increase in SUV uptake of the left cervical lymph nodes; no distant metastatic disease.  Discontinued chemotherapy; refer to CAR-T;  OCT 12th 2021- [facility]- -New extensive cervical, supraclavicular, intrathoracic, axillary and abdominal and pelvic lymph nodes, worrisome for lymphoma involvement. New splenomegaly with new, diffuse uptake, now more intense than the liver, concerning for lymphoma involvement. S/p POLA-BENDA  # CAR-T/Axi-cel on 09/16/20 preceded by fludarabine /cyclophosphamide  lymphodepletion.[[facility]; Dr.Grover]; complicated by CRS-G-3 [ICU]; dec 27th-PET CR.  # JAN 2025- CT/PET  scan-widespread lymphadenopathy; splenic enlargement concerning for recurrent lymphoma  # JAN 16th, 2025- LN Bx-1. Lymph node, biopsy, Right supraclavicular :      -  FINDINGS CONSISTENT WITH RECURRENT/PERSISTENT DIFFUSE LARGE B-CELL LYMPHOMA.      NOTE: THE THIN NEEDLE CORE BIOPSIES CONSISTS OF SHEETS OF MODERATE TO LARGE      SIZED CD20 POSITIVE B-CELL THAT COEXPRESS MUM1, BCL-2 AND BCL6 AND ARE NEGATIVE      FOR CD10 CONSISTENT WITH A POST GERMINAL CENTER/ACTIVATED IMMUNOPHENOTYPE.      CD3/CD5 HIGHLIGHTS SMALL BACKGROUND T CELLS.  CD23 IS NEGATIVE FOR A FOLLICULAR      DENDRITIC CELL MESHWORK.  THE PROLIFERATION RATE IS HIGH AT APPROXIMATELY 75% BY      KI-67.  FLOW CYTOMETRY IDENTIFIES STRONGLY KAPPA RESTRICTED B CELLS WITH A      NONSPECIFIC IMMUNOPHENOTYPE (CD5/CD10 NEGATIVE) WITH APPARENT LARGE SIZE BY      FORWARD SCATTER   # JAN 28th, 2025- Pola-Rituxan  [bridging]   # Sep 2020- Borderline low EF/Left ventricular hypokinesis [Dr.Gollan]-s October 9th stress test-nonischemic;   # DIAGNOSIS: DLBCL     Lymphoma of lymph nodes (HCC)  07/03/2019 Initial Diagnosis   Lymphoma of lymph nodes (HCC)   07/13/2019 - 12/07/2019 Chemotherapy   The patient had palonosetron  (ALOXI ) injection 0.25 mg, 0.25 mg, Intravenous,  Once, 7 of 7 cycles Administration: 0.25 mg (07/20/2019), 0.25 mg (08/13/2019), 0.25 mg (11/13/2019), 0.25 mg (09/03/2019), 0.25 mg (10/02/2019), 0.25 mg (10/23/2019), 0.25 mg (12/04/2019) pegfilgrastim  (NEULASTA  ONPRO KIT) injection 6 mg, 6 mg, Subcutaneous, Once, 2 of 2 cycles Administration: 6 mg (10/04/2019), 6 mg (10/25/2019) pegfilgrastim -cbqv (UDENYCA ) injection 6 mg, 6 mg, Subcutaneous, Once, 5 of 5 cycles Administration: 6 mg (07/23/2019), 6 mg (08/16/2019), 6 mg (09/06/2019), 6 mg (11/16/2019), 6 mg (12/07/2019) vinCRIStine  (ONCOVIN ) 2 mg in sodium chloride  0.9 % 50 mL chemo infusion, 2 mg, Intravenous,  Once,  7 of 7 cycles Administration: 2 mg (07/20/2019), 2 mg (08/13/2019), 2  mg (11/13/2019), 2 mg (09/03/2019), 2 mg (10/02/2019), 2 mg (10/23/2019), 2 mg (12/04/2019) cyclophosphamide  (CYTOXAN ) 1,900 mg in sodium chloride  0.9 % 250 mL chemo infusion, 750 mg/m2 = 1,900 mg, Intravenous,  Once, 7 of 7 cycles Administration: 1,900 mg (07/20/2019), 1,900 mg (08/13/2019), 2,000 mg (11/13/2019), 1,900 mg (09/03/2019), 2,000 mg (10/02/2019), 2,000 mg (10/23/2019), 2,000 mg (12/04/2019) etoposide  (VEPESID ) 250 mg in sodium chloride  0.9 % 1,000 mL chemo infusion, 100 mg/m2 = 250 mg, Intravenous,  Once, 6 of 6 cycles Dose modification: 50 mg/m2 (original dose 100 mg/m2, Cycle 3, Reason: Provider Judgment) Administration: 130 mg (08/14/2019), 130 mg (08/15/2019), 130 mg (09/03/2019), 130 mg (09/04/2019), 130 mg (09/05/2019), 130 mg (10/03/2019), 130 mg (10/04/2019), 130 mg (10/24/2019), 130 mg (10/25/2019), 130 mg (11/13/2019), 130 mg (11/14/2019), 130 mg (11/15/2019), 130 mg (10/02/2019), 130 mg (10/23/2019), 130 mg (12/04/2019), 130 mg (12/05/2019), 130 mg (12/06/2019) dexamethasone  (DECADRON ) 12 mg in sodium chloride  0.9 % 145 mL IVPB, , Intravenous,  Once, 8 of 8 cycles Administration:  (07/13/2019),  (07/20/2019),  (08/13/2019),  (11/13/2019),  (09/03/2019),  (10/02/2019),  (10/23/2019),  (12/04/2019) riTUXimab -pvvr (RUXIENCE ) 900 mg in sodium chloride  0.9 % 250 mL (2.6471 mg/mL) infusion, 375 mg/m2 = 900 mg (100 % of original dose 375 mg/m2), Intravenous,  Once, 1 of 1 cycle Dose modification: 375 mg/m2 (original dose 375 mg/m2, Cycle 1) Administration: 900 mg (07/13/2019)  for chemotherapy treatment.    05/09/2020 - 06/06/2020 Chemotherapy   The patient had palonosetron  (ALOXI ) injection 0.25 mg, 0.25 mg, Intravenous,  Once, 2 of 4 cycles Administration: 0.25 mg (05/09/2020), 0.25 mg (05/30/2020) CARBOplatin  (PARAPLATIN ) 750 mg in sodium chloride  0.9 % 250 mL chemo infusion, 750 mg (100 % of original dose 750 mg), Intravenous,  Once, 2 of 4 cycles Dose modification:   (original dose 750 mg, Cycle 1, Reason: Provider  Judgment, Comment: labs from care every where. ) Administration: 750 mg (05/09/2020), 750 mg (05/30/2020) gemcitabine  (GEMZAR ) 2,500 mg in sodium chloride  0.9 % 250 mL chemo infusion, 2,584 mg, Intravenous,  Once, 2 of 4 cycles Administration: 2,500 mg (05/09/2020), 2,500 mg (05/16/2020), 2,500 mg (05/30/2020), 2,500 mg (06/06/2020) fosaprepitant  (EMEND ) 150 mg in sodium chloride  0.9 % 145 mL IVPB, 150 mg, Intravenous,  Once, 2 of 4 cycles Administration: 150 mg (05/09/2020), 150 mg (05/30/2020)  for chemotherapy treatment.    08/27/2020 - 08/27/2020 Chemotherapy   Patient is on Treatment Plan : NON-HODGKINS LYMPHOMA RELAPSED/ REFRACTORY DLBCL Polatuzumab + Bendamustine  + Rituximab  q21d x 6 cycles     11/13/2020 Cancer Staging   Staging form: Hodgkin and Non-Hodgkin Lymphoma, AJCC 8th Edition - Clinical: Stage IV (Diffuse large B-cell lymphoma) - Signed by Shontae Rosiles R, MD on 11/13/2020   11/29/2023 -  Chemotherapy   Patient is on Treatment Plan : NON-HODGKINS LYMPHOMA RELAPSED/ REFRACTORY DLBCL Polatuzumab D1 ++ Rituximab  D1 q21d x 6 cycles       HISTORY OF PRESENTING ILLNESS: Ambulating independently.  Alone.   Eric [name] 73 y.o.  male relapsed/refractory diffuse large B-cell lymphoma [s/p CART therapy at [facility] in November 2021]- s/p  POLA-Ritx x6 cycles  is here for a follow up/and review results of the PET scan.  Patient states continues to have mild to moderate neuropathy of the right upper extremity.  Other than that, patient presents well today with no other new or acute concerns.    Denies any lump. No fever no chills.  No nausea vomiting. No  shortness of breath or cough.   Review of Systems  Constitutional:  Negative for chills, diaphoresis and fever.  HENT:  Positive for hearing loss. Negative for nosebleeds and sore throat.   Eyes:  Negative for double vision.  Respiratory:  Negative for hemoptysis, sputum production and wheezing.   Cardiovascular:  Positive for leg swelling.  Negative for chest pain, palpitations and orthopnea.  Gastrointestinal:  Negative for abdominal pain, blood in stool, constipation, diarrhea, heartburn, melena, nausea and vomiting.  Genitourinary:  Negative for dysuria, frequency and urgency.  Musculoskeletal:  Negative for back pain and joint pain.  Skin: Negative.  Negative for itching and rash.  Neurological:  Negative for dizziness, tingling, focal weakness, weakness and headaches.  Endo/Heme/Allergies:  Does not bruise/bleed easily.  Psychiatric/Behavioral:  Negative for depression. The patient does not have insomnia.      MEDICAL HISTORY:  Past Medical History:  Diagnosis Date   Adopted person    Cancer [facility])    Colon polyps    Pancreatitis    Rectal bleeding 02/24/2014   Sepsis (HCC) 09/18/2019    SURGICAL HISTORY: Past Surgical History:  Procedure Laterality Date   CHOLECYSTECTOMY     COLONOSCOPY  02/28/14   IR IMAGING GUIDED PORT INSERTION  07/18/2019   IR REMOVAL TUN ACCESS W/ PORT W/O FL MOD SED  05/29/2021   LYMPH NODE BIOPSY Left 03/28/2020   Procedure: EXCISION OF SUBMANDIBULAR  LYMPH NODE;  Surgeon: Milissa Hamming, MD;  Location: [facility];  Service: ENT;  Laterality: Left;   POLYPECTOMY     colon polyp removed   TONSILLECTOMY      SOCIAL HISTORY: Social History   Socioeconomic History   Marital status: Married    Spouse name: Not on file   Number of children: Not on file   Years of education: Not on file   Highest education level: 12th grade  Occupational History    Comment: Retired from [employer]  Tobacco Use   Smoking status: Every Day    Types: Cigars   Smokeless tobacco: Never   Tobacco comments:    smoke 1/2 ppd cigaretes since 73 yo. changed to cigars at age July 2004.   Vaping Use   Vaping status: Never Used  Substance and Sexual Activity   Alcohol use: Yes    Comment: 6-7 drinks weekly   Drug use: No   Sexual activity: Not on file  Other Topics Concern   Not on file  Social  History Narrative   Lives at home with wife; in [place]. retd from [employer]. Cigar smoker; drinks liquor. Children - grown up.    Social Drivers of Corporate investment banker Strain: Low Risk  (08/22/2023)   Overall Financial Resource Strain (CARDIA)    Difficulty of Paying Living Expenses: Not hard at all  Food Insecurity: No Food Insecurity (08/22/2023)   Hunger Vital Sign    Worried About Running Out of Food in the Last Year: Never true    Ran Out of Food in the Last Year: Never true  Transportation Needs: No Transportation Needs (08/22/2023)   PRAPARE - Administrator, Civil Service (Medical): No    Lack of Transportation (Non-Medical): No  Physical Activity: Insufficiently Active (08/22/2023)   Exercise Vital Sign    Days of Exercise per Week: 1 day    Minutes of Exercise per Session: 10 min  Stress: No Stress Concern Present (08/22/2023)   [employer] of Occupational Health - Occupational Stress Questionnaire  Feeling of Stress : Only a little  Social Connections: Moderately Isolated (08/22/2023)   Social Connection and Isolation Panel    Frequency of Communication with Friends and Family: Once a week    Frequency of Social Gatherings with Friends and Family: Twice a week    Attends Religious Services: Never    Database administrator or Organizations: No    Attends Engineer, structural: Not on file    Marital Status: Married  Catering manager Violence: Not on file    FAMILY HISTORY: Family History  Adopted: Yes    ALLERGIES:  is allergic to allopurinol , azithromycin , and losartan  potassium.  MEDICATIONS:  Current Outpatient Medications  Medication Sig Dispense Refill   furosemide  (LASIX ) 20 MG tablet Take 2 pills a day x 7; and then  one a day- with Kdur 30 tablet 0   Multiple Vitamin (MULTI-VITAMIN) tablet Take 1 tablet by mouth daily.     prochlorperazine  (COMPAZINE ) 10 MG tablet Take 1 tablet (10 mg total) by mouth every 6 (six)  hours as needed for nausea or vomiting. 40 tablet 1   Sulfamethoxazole -Trimethoprim  (BACTRIM  PO) Take 1 tablet by mouth every Monday, Wednesday, and Friday.     valACYclovir  HCl (VALTREX  PO) Take 1 tablet by mouth daily.     febuxostat  (ULORIC ) 40 MG tablet Take 40 mg a day x1 week, and then increase to 80 mg a day. (Patient not taking: Reported on 04/11/2024) 60 tablet 1   gabapentin  (NEURONTIN ) 100 MG capsule Take 1 capsule (100 mg total) by mouth 3 (three) times daily. (Patient not taking: Reported on 02/29/2024) 90 capsule 0   ondansetron  (ZOFRAN ) 8 MG tablet One pill every 8 hours as needed for nausea/vomitting. (Patient not taking: Reported on 11/29/2023) 40 tablet 1   potassium chloride  SA (KLOR-CON  M) 20 MEQ tablet 1 pill twice a day (Patient not taking: Reported on 05/11/2024) 30 tablet 3   No current facility-administered medications for this visit.      SABRA  PHYSICAL EXAMINATION: ECOG PERFORMANCE STATUS: 1 - Symptomatic but completely ambulatory  Vitals:   05/11/24 0943  BP: (!) 108/58  Pulse: 65  Resp: 19  Temp: (!) 96.4 F (35.8 C)  SpO2: 99%      Filed Weights   05/11/24 0943  Weight: 280 lb 6.4 oz (127.2 kg)     Physical Exam HENT:     Head: Normocephalic and atraumatic.     Mouth/Throat:     Pharynx: No oropharyngeal exudate.  Eyes:     Pupils: Pupils are equal, round, and reactive to light.  Cardiovascular:     Rate and Rhythm: Normal rate and regular rhythm.  Pulmonary:     Effort: Pulmonary effort is normal. No respiratory distress.     Breath sounds: Normal breath sounds. No wheezing.  Abdominal:     General: Bowel sounds are normal. There is no distension.     Palpations: Abdomen is soft. There is no mass.     Tenderness: There is no abdominal tenderness. There is no guarding or rebound.  Musculoskeletal:        General: No tenderness. Normal range of motion.     Cervical back: Normal range of motion and neck supple.  Skin:    General: Skin is  warm.  Neurological:     Mental Status: He is alert and oriented to person, place, and time.  Psychiatric:        Mood and Affect: Affect normal.  LABORATORY DATA:  I have reviewed the data as listed Lab Results  Component Value Date   WBC 2.3 (L) 05/11/2024   HGB 14.9 05/11/2024   HCT 43.5 05/11/2024   MCV 94.2 05/11/2024   PLT 156 05/11/2024   Recent Labs    03/20/24 0816 04/11/24 0804 05/11/24 0926  NA 141 140 140  K 3.9 3.9 4.1  CL 107 109 106  CO2 25 25 26   GLUCOSE 104* 108* 120*  BUN 19 24* 21  CREATININE 1.03 1.13 1.04  CALCIUM  8.9 8.8* 9.1  GFRNONAA >60 >60 >60  PROT 5.9* 6.0* 6.2*  ALBUMIN 3.9 4.2 4.3  AST 21 23 24   ALT 20 26 26   ALKPHOS 73 71 73  BILITOT 0.8 1.1 1.1    RADIOGRAPHIC STUDIES: I have personally reviewed the radiological images as listed and agreed with the findings in the report. NM PET Image Restage (PS) Skull Base to Thigh (F-18 FDG) Result Date: 05/03/2024 CLINICAL DATA:  Subsequent treatment strategy for lymphoma. Status post supraclavicular right-sided biopsy. Chemotherapy 2 weeks ago. EXAM: NUCLEAR MEDICINE PET SKULL BASE TO THIGH TECHNIQUE: 12.5 mCi F-18 FDG was injected intravenously. Full-ring PET imaging was performed from the skull base to thigh after the radiotracer. CT data was obtained and used for attenuation correction and anatomic localization. Fasting blood glucose: 101 mg/dl COMPARISON:  95/84/7974 FINDINGS: Mediastinal blood pool activity: SUV max 3.6 Liver activity: SUV max 4.0 NECK: No areas of abnormal hypermetabolism. Incidental CT findings: Left maxillary sinus mucous retention cyst or polyp. Bilateral carotid atherosclerosis. No cervical adenopathy. Presumed sebaceous cyst about the left posterior neck at 1.4 cm. CHEST: No pulmonary parenchymal or thoracic nodal hypermetabolism. Incidental CT findings: Cardiomegaly. Aortic and coronary artery calcification. Pulmonary artery enlargement, outflow tract 3.6 cm. No thoracic  adenopathy. ABDOMEN/PELVIS: No abdominopelvic parenchymal or nodal hypermetabolism. No splenic hypermetabolism. Incidental CT findings: Normal adrenal glands. Left larger than right renal collecting system calculi. A left renal pelvic dominant stone of 13 mm. Left renal sinus cysts. Interpolar 4.8 cm left renal cortical cyst . In the absence of clinically indicated signs/symptoms require(s) no independent follow-up. Moderate caudate lobe enlargement. Splenomegaly at 18.1 cm transverse, similar. Retroaortic left renal vein.  Extensive colonic diverticulosis. SKELETON: No abnormal marrow activity. Incidental CT findings: None. IMPRESSION: 1. No evidence of hypermetabolic residual or recurrent lymphoma. 2. Caudate lobe enlargement can be seen with cirrhosis. Correlate with risk factors. Persistent splenomegaly which may be secondary. 3. Incidental findings, including: Bilateral nephrolithiasis. Coronary artery atherosclerosis. Aortic Atherosclerosis (ICD10-I70.0). Pulmonary artery enlargement suggests pulmonary arterial hypertension. Sinus disease. Electronically Signed   By: Rockey Kilts M.D.   On: 05/03/2024 14:02     ASSESSMENT & PLAN:   Lymphoma of lymph nodes (HCC) # Chemo refractory/ Relapsed diffuse large B-cell lymphoma-ABC subtype; IV [spleen]; NOV 2021- CART therapy. DEC 30th, 2024- RECURRENT/RELPASED.  # JAN 2025-neck lymph node biopsy recurrent diffuse B-cell lymphoma-ABC subtype.   Patient currently on  bridging therapy  with-POLA+ rituximab  infusion.  Patient noted -NOT A CANDIDATE REPEAT CAR-T Clinical trial at [facility].declined  allo-stem cell transplant.   # currently s/p Pola-Rituxan  # 6.  JULY 2025- PET scan-complete response. Patient has declined moving forward with alloSCT due to his age, social/home factors-;and  APRIL 23rd, 2025- ct DNA negative- is reasonable to continue surveilleinace.  Discussed with Dr. Claudean.  Agree with the plan-clonoseq every 3 months.  # Hemolytic anemia -[sec to  DLBCL -dec 2024] Hb 13 today- monitor for now.   # Right  more than left- bilateral leg swelling: sec to fluid- retention/ weight gain from steroids [no CHF]-restart Lasix  potassium.  February 2025-ultrasound negative for any DVT-   # Borderline LOW EF of 45-50%; Sep Echo 50-55% [Dr.Gollan]- on coreg  BID  Dr. Gollan-JAN 29th, 2025- 2D echo- 60-65%-  stable.   # Infectious Disease/Prophylaxis: continuevaltrex/Bactrim  DS.   # Cirrhosis/Persistent splenomegaly which may be secondary- based Imaging [No decompensation]-no prior history of hepatitis B or C. stable.   # High risk of tumor lysis/  elevated uric acid-10- [ Hx of rash to allopurinol ]-currently on febuxostat   80 mg a day.   # PIV- s/p port explanation- pt declined port at this time.   # ACP: full code.  PS # DISPOSITION:  # Follow up in 3 months - MD- 3 weeks PRIOR- labs- cbc/cmp; LDH; clonoseq; uric acid-   Dr.B  # I reviewed the blood work- with the patient in detail; also reviewed the imaging independently [as summarized above]; and with the patient in detail.    All questions were answered. The patient knows to call the clinic with any problems, questions or concerns.    Cindy JONELLE Joe, MD 05/11/2024 10:21 AM

## 2024-05-11 NOTE — Assessment & Plan Note (Addendum)
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 DEC 30th, 2024- RECURRENT/RELPASED.  # JAN 2025-neck lymph node biopsy recurrent diffuse B-cell lymphoma-ABC subtype.   Patient currently on  bridging therapy  with-POLA+ rituximab  infusion.  Patient noted -NOT A CANDIDATE REPEAT CAR-T Clinical trial at [facility].declined  allo-stem cell transplant.   # currently s/p Pola-Rituxan  # 6.  JULY 2025- PET scan-complete response. Patient has declined moving forward with alloSCT due to his age, social/home factors-;and  APRIL 23rd, 2025- ct DNA negative- is reasonable to continue surveilleinace.  Discussed with Dr. Claudean.  Agree with the plan-clonoseq every 3 months.  # Hemolytic anemia -[sec to DLBCL -dec 2024] Hb 13 today- monitor for now.   # Right more than left- bilateral leg swelling: sec to fluid- retention/ weight gain from steroids [no CHF]-restart Lasix  potassium.  February 2025-ultrasound negative for any DVT-   # Borderline LOW EF of 45-50%; Sep Echo 50-55% [Dr.Gollan]- on coreg  BID  Dr. Gollan-JAN 29th, 2025- 2D echo- 60-65%-  stable.   # Infectious Disease/Prophylaxis: continuevaltrex/Bactrim  DS.   # Cirrhosis/Persistent splenomegaly which may be secondary- based Imaging [No decompensation]-no prior history of hepatitis B or C. stable.   # High risk of tumor lysis/  elevated uric acid-10- [ Hx of rash to allopurinol ]-currently on febuxostat   80 mg a day.   # PIV- s/p port explanation- pt declined port at this time.   # ACP: full code.  PS # DISPOSITION:  # Follow up in 3 months - MD- 3 weeks PRIOR- labs- cbc/cmp; LDH; clonoseq; uric acid-   Dr.B  # I reviewed the blood work- with the patient in detail; also reviewed the imaging independently [as summarized above]; and with the patient in detail.

## 2024-05-12 ENCOUNTER — Other Ambulatory Visit: Payer: Self-pay

## 2024-05-13 ENCOUNTER — Encounter: Payer: Self-pay | Admitting: Internal Medicine

## 2024-06-28 ENCOUNTER — Ambulatory Visit: Admitting: Cardiology

## 2024-07-09 ENCOUNTER — Other Ambulatory Visit: Payer: Self-pay | Admitting: *Deleted

## 2024-07-09 DIAGNOSIS — C8330 Diffuse large B-cell lymphoma, unspecified site: Secondary | ICD-10-CM

## 2024-07-20 ENCOUNTER — Inpatient Hospital Stay: Attending: Internal Medicine

## 2024-07-20 DIAGNOSIS — C83398 Diffuse large b-cell lymphoma of other extranodal and solid organ sites: Secondary | ICD-10-CM | POA: Insufficient documentation

## 2024-07-20 DIAGNOSIS — C8330 Diffuse large B-cell lymphoma, unspecified site: Secondary | ICD-10-CM

## 2024-07-20 DIAGNOSIS — C8590 Non-Hodgkin lymphoma, unspecified, unspecified site: Secondary | ICD-10-CM

## 2024-07-20 LAB — CMP (CANCER CENTER ONLY)
ALT: 20 U/L (ref 0–44)
AST: 27 U/L (ref 15–41)
Albumin: 4.1 g/dL (ref 3.5–5.0)
Alkaline Phosphatase: 78 U/L (ref 38–126)
Anion gap: 8 (ref 5–15)
BUN: 20 mg/dL (ref 8–23)
CO2: 25 mmol/L (ref 22–32)
Calcium: 8.9 mg/dL (ref 8.9–10.3)
Chloride: 104 mmol/L (ref 98–111)
Creatinine: 0.94 mg/dL (ref 0.61–1.24)
GFR, Estimated: >60 mL/min (ref >60)
Glucose, Bld: 115 mg/dL — ABNORMAL HIGH (ref 70–99)
Potassium: 4 mmol/L (ref 3.5–5.1)
Sodium: 137 mmol/L (ref 135–145)
Total Bilirubin: 1.2 mg/dL (ref 0.0–1.2)
Total Protein: 6.1 g/dL — ABNORMAL LOW (ref 6.5–8.1)

## 2024-07-20 LAB — CBC WITH DIFFERENTIAL (CANCER CENTER ONLY)
Abs Immature Granulocytes: 0.1 K/uL — ABNORMAL HIGH (ref 0.00–0.07)
Basophils Absolute: 0 K/uL (ref 0.0–0.1)
Basophils Relative: 0 %
Eosinophils Absolute: 0.2 K/uL (ref 0.0–0.5)
Eosinophils Relative: 3 %
HCT: 44.6 % (ref 39.0–52.0)
Hemoglobin: 15.6 g/dL (ref 13.0–17.0)
Immature Granulocytes: 2 %
Lymphocytes Relative: 7 %
Lymphs Abs: 0.5 K/uL — ABNORMAL LOW (ref 0.7–4.0)
MCH: 31.9 pg (ref 26.0–34.0)
MCHC: 35 g/dL (ref 30.0–36.0)
MCV: 91.2 fL (ref 80.0–100.0)
Monocytes Absolute: 0.4 K/uL (ref 0.1–1.0)
Monocytes Relative: 7 %
Neutro Abs: 5.6 K/uL (ref 1.7–7.7)
Neutrophils Relative %: 81 %
Platelet Count: 125 K/uL — ABNORMAL LOW (ref 150–400)
RBC: 4.89 MIL/uL (ref 4.22–5.81)
RDW: 15.9 % — ABNORMAL HIGH (ref 11.5–15.5)
Smear Review: NORMAL
WBC Count: 6.8 K/uL (ref 4.0–10.5)
nRBC: 0 % (ref 0.0–0.2)

## 2024-07-20 LAB — LACTATE DEHYDROGENASE: LDH: 298 U/L — ABNORMAL HIGH (ref 98–192)

## 2024-07-20 LAB — URIC ACID: Uric Acid, Serum: 6.7 mg/dL (ref 3.7–8.6)

## 2024-07-26 LAB — MISCELLANEOUS TEST

## 2024-07-29 ENCOUNTER — Other Ambulatory Visit: Payer: Self-pay

## 2024-08-07 ENCOUNTER — Telehealth: Payer: Self-pay | Admitting: Internal Medicine

## 2024-08-07 NOTE — Telephone Encounter (Signed)
 LA- The patient sees you on 10th-his labs including LDH are slightly abnormal.  However his clonoseq-was negative.   If patient is not very symptomatic from his underlying labs-I think is very sensitive-okay to hold off any imaging.  Have him follow-up with me in 3 months with repeat PET scanand prior clonoseq.   However if his concerning for any recurrence based on symptoms/exam-get a PET scan now.  Thanks GB

## 2024-08-10 ENCOUNTER — Ambulatory Visit: Admitting: Internal Medicine

## 2024-08-10 ENCOUNTER — Inpatient Hospital Stay: Attending: Internal Medicine | Admitting: Nurse Practitioner

## 2024-08-10 ENCOUNTER — Encounter: Payer: Self-pay | Admitting: Nurse Practitioner

## 2024-08-10 VITALS — BP 126/67 | HR 60 | Temp 98.2°F | Resp 16 | Ht 72.0 in | Wt 264.5 lb

## 2024-08-10 DIAGNOSIS — C833A Diffuse large b-cell lymphoma, in remission: Secondary | ICD-10-CM | POA: Insufficient documentation

## 2024-08-10 DIAGNOSIS — C8330 Diffuse large B-cell lymphoma, unspecified site: Secondary | ICD-10-CM | POA: Diagnosis not present

## 2024-08-10 NOTE — Progress Notes (Signed)
 [facility] CONSULT NOTE  Patient Care Team: Gasper Nancyann BRAVO, MD as PCP - General (Family Medicine) [name], [name] ORN, MD (General Surgery) Rennie Cindy SAUNDERS, MD as Consulting Physician (Internal Medicine)  CHIEF COMPLAINTS/PURPOSE OF CONSULTATION: Diffuse large B-cell lymphoma  #  Oncology History Overview Note  #June 29, 2019-CT- bulky bilateral neck adenopathy left more than right; September 2020-Diffuse B-cell lymphoma; ABC subtype; NEGATIVE FISH studies.  PET scan -bulky adenopathy involving neck /lymphadenopathy mediastinum /abdomen/pelvis inguinal lymph nodes; splenic involvement.  Question bone.  Stage IV [spleen involvement; no bone marrow biopsy].  BCL-2: Positive /BCL 6: Positive /MUM-1: Positive /CD10: Negative 'Ki-67: High proliferation index (90%) /CD30: Negative /Cyclin D1: Negative C-MYC: Equivocal (20% staining seen on limited and fragmented tissue) /EBER: Negative   # Ritux-Pred- 9/11; 9/18- R-COP [held adria- sec to EF 45-50%]  #May 2021-PET scan left neck recurrent disease; excisional biopsy [Dr. Vaught]-recurrent diffuse left B-cell lymphoma  #November 2020-bilateral PE; on xarelto   #J07/07/2020- 2021- Gem-CarboDex [II opinion [facility]; Dr. Claudean; stem cell transplant eval]; no Rituxan -repeat CD20 negative.   #S/p 2 cycles-gem carbo Dex-June 18, 2020 PET scan-slight increase in SUV uptake of the left cervical lymph nodes; no distant metastatic disease.  Discontinued chemotherapy; refer to CAR-T;  OCT 12th 2021- [facility]- -New extensive cervical, supraclavicular, intrathoracic, axillary and abdominal and pelvic lymph nodes, worrisome for lymphoma involvement. New splenomegaly with new, diffuse uptake, now more intense than the liver, concerning for lymphoma involvement. S/p POLA-BENDA  # CAR-T/Axi-cel on 09/16/20 preceded by fludarabine /cyclophosphamide  lymphodepletion.[[facility]; Dr.Grover]; complicated by CRS-G-3 [ICU]; dec 27th-PET CR.  # JAN 2025- CT/PET  scan-widespread lymphadenopathy; splenic enlargement concerning for recurrent lymphoma  # JAN 16th, 2025- LN Bx-1. Lymph node, biopsy, Right supraclavicular :      -  FINDINGS CONSISTENT WITH RECURRENT/PERSISTENT DIFFUSE LARGE B-CELL LYMPHOMA.      NOTE: THE THIN NEEDLE CORE BIOPSIES CONSISTS OF SHEETS OF MODERATE TO LARGE      SIZED CD20 POSITIVE B-CELL THAT COEXPRESS MUM1, BCL-2 AND BCL6 AND ARE NEGATIVE      FOR CD10 CONSISTENT WITH A POST GERMINAL CENTER/ACTIVATED IMMUNOPHENOTYPE.      CD3/CD5 HIGHLIGHTS SMALL BACKGROUND T CELLS.  CD23 IS NEGATIVE FOR A FOLLICULAR      DENDRITIC CELL MESHWORK.  THE PROLIFERATION RATE IS HIGH AT APPROXIMATELY 75% BY      KI-67.  FLOW CYTOMETRY IDENTIFIES STRONGLY KAPPA RESTRICTED B CELLS WITH A      NONSPECIFIC IMMUNOPHENOTYPE (CD5/CD10 NEGATIVE) WITH APPARENT LARGE SIZE BY      FORWARD SCATTER   # JAN 28th, 2025- Pola-Rituxan  [bridging]   # Sep 2020- Borderline low EF/Left ventricular hypokinesis [Dr.Gollan]-s October 9th stress test-nonischemic;   # DIAGNOSIS: DLBCL     Lymphoma of lymph nodes (HCC)  07/03/2019 Initial Diagnosis   Lymphoma of lymph nodes (HCC)   07/13/2019 - 12/07/2019 Chemotherapy   The patient had palonosetron  (ALOXI ) injection 0.25 mg, 0.25 mg, Intravenous,  Once, 7 of 7 cycles Administration: 0.25 mg (07/20/2019), 0.25 mg (08/13/2019), 0.25 mg (11/13/2019), 0.25 mg (09/03/2019), 0.25 mg (10/02/2019), 0.25 mg (10/23/2019), 0.25 mg (12/04/2019) pegfilgrastim  (NEULASTA  ONPRO KIT) injection 6 mg, 6 mg, Subcutaneous, Once, 2 of 2 cycles Administration: 6 mg (10/04/2019), 6 mg (10/25/2019) pegfilgrastim -cbqv (UDENYCA ) injection 6 mg, 6 mg, Subcutaneous, Once, 5 of 5 cycles Administration: 6 mg (07/23/2019), 6 mg (08/16/2019), 6 mg (09/06/2019), 6 mg (11/16/2019), 6 mg (12/07/2019) vinCRIStine  (ONCOVIN ) 2 mg in sodium chloride  0.9 % 50 mL chemo infusion, 2 mg, Intravenous,  Once,  7 of 7 cycles Administration: 2 mg (07/20/2019), 2 mg (08/13/2019), 2  mg (11/13/2019), 2 mg (09/03/2019), 2 mg (10/02/2019), 2 mg (10/23/2019), 2 mg (12/04/2019) cyclophosphamide  (CYTOXAN ) 1,900 mg in sodium chloride  0.9 % 250 mL chemo infusion, 750 mg/m2 = 1,900 mg, Intravenous,  Once, 7 of 7 cycles Administration: 1,900 mg (07/20/2019), 1,900 mg (08/13/2019), 2,000 mg (11/13/2019), 1,900 mg (09/03/2019), 2,000 mg (10/02/2019), 2,000 mg (10/23/2019), 2,000 mg (12/04/2019) etoposide  (VEPESID ) 250 mg in sodium chloride  0.9 % 1,000 mL chemo infusion, 100 mg/m2 = 250 mg, Intravenous,  Once, 6 of 6 cycles Dose modification: 50 mg/m2 (original dose 100 mg/m2, Cycle 3, Reason: Provider Judgment) Administration: 130 mg (08/14/2019), 130 mg (08/15/2019), 130 mg (09/03/2019), 130 mg (09/04/2019), 130 mg (09/05/2019), 130 mg (10/03/2019), 130 mg (10/04/2019), 130 mg (10/24/2019), 130 mg (10/25/2019), 130 mg (11/13/2019), 130 mg (11/14/2019), 130 mg (11/15/2019), 130 mg (10/02/2019), 130 mg (10/23/2019), 130 mg (12/04/2019), 130 mg (12/05/2019), 130 mg (12/06/2019) dexamethasone  (DECADRON ) 12 mg in sodium chloride  0.9 % 145 mL IVPB, , Intravenous,  Once, 8 of 8 cycles Administration:  (07/13/2019),  (07/20/2019),  (08/13/2019),  (11/13/2019),  (09/03/2019),  (10/02/2019),  (10/23/2019),  (12/04/2019) riTUXimab -pvvr (RUXIENCE ) 900 mg in sodium chloride  0.9 % 250 mL (2.6471 mg/mL) infusion, 375 mg/m2 = 900 mg (100 % of original dose 375 mg/m2), Intravenous,  Once, 1 of 1 cycle Dose modification: 375 mg/m2 (original dose 375 mg/m2, Cycle 1) Administration: 900 mg (07/13/2019)  for chemotherapy treatment.    05/09/2020 - 06/06/2020 Chemotherapy   The patient had palonosetron  (ALOXI ) injection 0.25 mg, 0.25 mg, Intravenous,  Once, 2 of 4 cycles Administration: 0.25 mg (05/09/2020), 0.25 mg (05/30/2020) CARBOplatin  (PARAPLATIN ) 750 mg in sodium chloride  0.9 % 250 mL chemo infusion, 750 mg (100 % of original dose 750 mg), Intravenous,  Once, 2 of 4 cycles Dose modification:   (original dose 750 mg, Cycle 1, Reason: Provider  Judgment, Comment: labs from care every where. ) Administration: 750 mg (05/09/2020), 750 mg (05/30/2020) gemcitabine  (GEMZAR ) 2,500 mg in sodium chloride  0.9 % 250 mL chemo infusion, 2,584 mg, Intravenous,  Once, 2 of 4 cycles Administration: 2,500 mg (05/09/2020), 2,500 mg (05/16/2020), 2,500 mg (05/30/2020), 2,500 mg (06/06/2020) fosaprepitant  (EMEND ) 150 mg in sodium chloride  0.9 % 145 mL IVPB, 150 mg, Intravenous,  Once, 2 of 4 cycles Administration: 150 mg (05/09/2020), 150 mg (05/30/2020)  for chemotherapy treatment.    08/27/2020 - 08/27/2020 Chemotherapy   Patient is on Treatment Plan : NON-HODGKINS LYMPHOMA RELAPSED/ REFRACTORY DLBCL Polatuzumab + Bendamustine  + Rituximab  q21d x 6 cycles     11/13/2020 Cancer Staging   Staging form: Hodgkin and Non-Hodgkin Lymphoma, AJCC 8th Edition - Clinical: Stage IV (Diffuse large B-cell lymphoma) - Signed by Brahmanday, Govinda R, MD on 11/13/2020   11/29/2023 -  Chemotherapy   Patient is on Treatment Plan : NON-HODGKINS LYMPHOMA RELAPSED/ REFRACTORY DLBCL Polatuzumab D1 ++ Rituximab  D1 q21d x 6 cycles       HISTORY OF PRESENTING ILLNESS: Ambulating independently.  Alone.   Eric [name] 73 y.o. male with relapsed/refractory diffuse large B-cell lymphoma status post CAR-T therapy at [facility] in November 2021 and POLA-Ritx x 6 cycles in January 2025-June 2025, who returns to clinic for follow-up and discussion of lab results.  In November. He's struggling with his wife's illness.  Has mild to moderate neuropathy of the right upper extremity which is unchanged and stable.  He has a lump at the base of his left scalp that is unchanged over the  last several years.  Otherwise denies any lumps or bumps.  No fevers or chills.  No nausea or vomiting.  No cough or shortness of breath.  No changes in his bowel movements.  He denies any concerns today.   Review of Systems  Constitutional:  Negative for chills, diaphoresis and fever.  HENT:  Positive for hearing loss.  Negative for nosebleeds and sore throat.   Eyes:  Negative for double vision.  Respiratory:  Negative for hemoptysis, sputum production and wheezing.   Cardiovascular:  Positive for leg swelling. Negative for chest pain, palpitations and orthopnea.  Gastrointestinal:  Negative for abdominal pain, blood in stool, constipation, diarrhea, heartburn, melena, nausea and vomiting.  Genitourinary:  Negative for dysuria, frequency and urgency.  Musculoskeletal:  Negative for back pain and joint pain.  Skin: Negative.  Negative for itching and rash.  Neurological:  Negative for dizziness, tingling, focal weakness, weakness and headaches.  Endo/Heme/Allergies:  Does not bruise/bleed easily.  Psychiatric/Behavioral:  Negative for depression. The patient does not have insomnia.     MEDICAL HISTORY:  Past Medical History:  Diagnosis Date   Adopted person    Cancer [facility])    Colon polyps    Pancreatitis    Rectal bleeding 02/24/2014   Sepsis (HCC) 09/18/2019   SURGICAL HISTORY: Past Surgical History:  Procedure Laterality Date   CHOLECYSTECTOMY     COLONOSCOPY  02/28/14   IR IMAGING GUIDED PORT INSERTION  07/18/2019   IR REMOVAL TUN ACCESS W/ PORT W/O FL MOD SED  05/29/2021   LYMPH NODE BIOPSY Left 03/28/2020   Procedure: EXCISION OF SUBMANDIBULAR  LYMPH NODE;  Surgeon: Milissa Hamming, MD;  Location: [facility];  Service: ENT;  Laterality: Left;   POLYPECTOMY     colon polyp removed   TONSILLECTOMY     SOCIAL HISTORY: Social History   Socioeconomic History   Marital status: Married    Spouse name: Not on file   Number of children: Not on file   Years of education: Not on file   Highest education level: 12th grade  Occupational History    Comment: Retired from [employer]  Tobacco Use   Smoking status: Every Day    Types: Cigars   Smokeless tobacco: Never   Tobacco comments:    smoke 1/2 ppd cigaretes since 73 yo. changed to cigars at age July 2004.   Vaping Use   Vaping  status: Never Used  Substance and Sexual Activity   Alcohol use: Yes    Comment: 6-7 drinks weekly   Drug use: No   Sexual activity: Not on file  Other Topics Concern   Not on file  Social History Narrative   Lives at home with wife; in [place]. retd from [employer]. Cigar smoker; drinks liquor. Children - grown up.    Social Drivers of Corporate investment banker Strain: Low Risk  (08/22/2023)   Overall Financial Resource Strain (CARDIA)    Difficulty of Paying Living Expenses: Not hard at all  Food Insecurity: No Food Insecurity (08/22/2023)   Hunger Vital Sign    Worried About Running Out of Food in the Last Year: Never true    Ran Out of Food in the Last Year: Never true  Transportation Needs: No Transportation Needs (08/22/2023)   PRAPARE - Administrator, Civil Service (Medical): No    Lack of Transportation (Non-Medical): No  Physical Activity: Insufficiently Active (08/22/2023)   Exercise Vital Sign    Days of Exercise per  Week: 1 day    Minutes of Exercise per Session: 10 min  Stress: No Stress Concern Present (08/22/2023)   [employer] of Occupational Health - Occupational Stress Questionnaire    Feeling of Stress : Only a little  Social Connections: Moderately Isolated (08/22/2023)   Social Connection and Isolation Panel    Frequency of Communication with Friends and Family: Once a week    Frequency of Social Gatherings with Friends and Family: Twice a week    Attends Religious Services: Never    Database administrator or Organizations: No    Attends Engineer, structural: Not on file    Marital Status: Married  Catering manager Violence: Not on file   FAMILY HISTORY: Family History  Adopted: Yes   ALLERGIES:  is allergic to allopurinol , azithromycin , and losartan  potassium.  MEDICATIONS:  Current Outpatient Medications  Medication Sig Dispense Refill   febuxostat  (ULORIC ) 40 MG tablet Take 40 mg a day x1 week, and then increase to  80 mg a day. (Patient not taking: Reported on 04/11/2024) 60 tablet 1   furosemide  (LASIX ) 20 MG tablet Take 2 pills a day x 7; and then  one a day- with Kdur 30 tablet 0   gabapentin  (NEURONTIN ) 100 MG capsule Take 1 capsule (100 mg total) by mouth 3 (three) times daily. (Patient not taking: Reported on 02/29/2024) 90 capsule 0   Multiple Vitamin (MULTI-VITAMIN) tablet Take 1 tablet by mouth daily.     ondansetron  (ZOFRAN ) 8 MG tablet One pill every 8 hours as needed for nausea/vomitting. (Patient not taking: Reported on 11/29/2023) 40 tablet 1   potassium chloride  SA (KLOR-CON  M) 20 MEQ tablet 1 pill twice a day (Patient not taking: Reported on 05/11/2024) 30 tablet 3   prochlorperazine  (COMPAZINE ) 10 MG tablet Take 1 tablet (10 mg total) by mouth every 6 (six) hours as needed for nausea or vomiting. 40 tablet 1   Sulfamethoxazole -Trimethoprim  (BACTRIM  PO) Take 1 tablet by mouth every Monday, Wednesday, and Friday.     valACYclovir  HCl (VALTREX  PO) Take 1 tablet by mouth daily.     No current facility-administered medications for this visit.   PHYSICAL EXAMINATION: ECOG PERFORMANCE STATUS: 1 - Symptomatic but completely ambulatory  Vitals:   08/10/24 0953  BP: 126/67  Pulse: 60  Resp: 16  Temp: 98.2 F (36.8 C)  SpO2: 100%   Filed Weights   08/10/24 0953  Weight: 264 lb 8 oz (120 kg)   Physical Exam Vitals reviewed.  Constitutional:      Appearance: He is not ill-appearing.  HENT:     Head: Normocephalic and atraumatic.     Mouth/Throat:     Mouth: Mucous membranes are moist.  Cardiovascular:     Rate and Rhythm: Normal rate and regular rhythm.  Pulmonary:     Effort: Pulmonary effort is normal. No respiratory distress.     Breath sounds: Normal breath sounds. No wheezing.  Abdominal:     General: There is no distension.     Palpations: Abdomen is soft. There is no mass.     Tenderness: There is no abdominal tenderness. There is no guarding or rebound.  Musculoskeletal:         General: No tenderness. Normal range of motion.  Lymphadenopathy:     Head:     Right side of head: No submental, submandibular, tonsillar, preauricular, posterior auricular or occipital adenopathy.     Left side of head: No submental, submandibular, tonsillar, preauricular, posterior auricular  or occipital adenopathy.     Cervical: No cervical adenopathy.     Upper Body:     Right upper body: No supraclavicular or axillary adenopathy.     Left upper body: No supraclavicular or axillary adenopathy.     Lower Body: No right inguinal adenopathy. No left inguinal adenopathy.  Skin:    General: Skin is warm.     Comments: 3 cm rounded, soft nodule base of left scalp - per patient stable  Neurological:     Mental Status: He is alert and oriented to person, place, and time.  Psychiatric:        Mood and Affect: Mood and affect normal.        Behavior: Behavior normal.    LABORATORY DATA:  I have reviewed the data as listed Lab Results  Component Value Date   WBC 6.8 07/20/2024   HGB 15.6 07/20/2024   HCT 44.6 07/20/2024   MCV 91.2 07/20/2024   PLT 125 (L) 07/20/2024   Recent Labs    04/11/24 0804 05/11/24 0926 07/20/24 0919  NA 140 140 137  K 3.9 4.1 4.0  CL 109 106 104  CO2 25 26 25   GLUCOSE 108* 120* 115*  BUN 24* 21 20  CREATININE 1.13 1.04 0.94  CALCIUM  8.8* 9.1 8.9  GFRNONAA >60 >60 >60  PROT 6.0* 6.2* 6.1*  ALBUMIN 4.2 4.3 4.1  AST 23 24 27   ALT 26 26 20   ALKPHOS 71 73 78  BILITOT 1.1 1.1 1.2   Component Ref Range & Units (hover) 3 wk ago (07/20/24) 3 mo ago (05/11/24) 4 mo ago (04/11/24) 4 mo ago (03/20/24) 5 mo ago (02/29/24) 5 mo ago (02/15/24) 6 mo ago (01/24/24)  LDH 298 High  157 CM 212 High  CM 192 CM 191 CM 215 High  CM 207 High    Component Ref Range & Units (hover) 3 wk ago (07/20/24) 5 mo ago (02/15/24) 6 mo ago (01/24/24) 7 mo ago (01/02/24) 8 mo ago (12/06/23) 8 mo ago (11/29/23) 8 mo ago (11/21/23)  Uric Acid, Serum 6.7 7.9 CM 4.7 CM 4.6 CM  6.3 CM 6.8 CM 11.0 High     RADIOGRAPHIC STUDIES: I have personally reviewed the radiological images as listed and agreed with the findings in the report. No results found.    ASSESSMENT & PLAN:   # Diffuse large B-cell lymphoma-chemo refractory/ relapsed.  ABC subtype- IV (spleen).  November 2021.  CAR-T therapy October 31, 2023.  Recurrent/relapse.  January 2025-9 lymph node biopsy consistent with recurrent diffuse large B-cell lymphoma ABC subtype.  He completed 6 cycles of POLA+rituximab  in June 2025.  Not a candidate for repeat CAR-T.  Clinical trial at [facility].  Declined allo-stem cell transplant.   # July 2025 PET scan showed complete response.  Patient declined moving forward with AlloSCT due to his age, social and home factors/wife's health.  February 22, 2024 ctDNA negative.  Dr. Rennie discussed with Dr. Claudean plan for surveillance.  # September 2025-today we discussed lab results in detail including clonoseq/ctDNA negative.  Uric acid normal at 6.7, LDH is elevated at 298.  Platelets have dipped to 125 but otherwise counts are stable.  Clinically, he is asymptomatic of recurrent disease.  On exam, he has a 3 cm nodule of the left scalp which he says is stable for many months.  Otherwise, no evidence of disease.  Given that he is asymptomatic, we will continue surveillance and plan to repeat his labs including clonoseq/ctDNA,  cbc, cmp, ldh, uric acid, and imaging with PET in 3 months.  He will follow-up with Dr. Rennie for results.  If he has any concerning symptoms in the meantime would recommend reevaluation.  # Hemolytic anemia -[sec to DLBCL -dec 2024] Hb 15.6 today- monitor for now.    # Right more than left- bilateral leg swelling: sec to fluid- retention/ weight gain from steroids [no CHF]-restart Lasix  potassium.  February 2025-ultrasound negative for any DVT.  Stable.   # Borderline LOW EF of 45-50%; Sep Echo 50-55% [Dr.Gollan]- on coreg  BID  Dr. Gollan-JAN 29th, 2025- 2D  echo- 60-65%-  stable.    # Infectious Disease/Prophylaxis: continue valtrex /Bactrim  DS.    # Cirrhosis/Persistent splenomegaly which may be secondary- based Imaging [No decompensation]-no prior history of hepatitis B or C. May be etiology of thrombocytopenia.    # High risk of tumor lysis/elevated uric acid-10- [Hx of rash to allopurinol ]- currently on febuxostat  80 mg a day. Uric acid stable.    # PIV- s/p port explanation. pt declined port at this time.    # ACP: full code.   PS  # DISPOSITION:  3 mo- see Dr Rennie 1-2 weeks prior- PET scan 3 weeks prior- labs (cbc, cmp, ldh, clonoseq, uric acid) la  No problem-specific Assessment & Plan notes found for this encounter.  All questions were answered. The patient knows to call the clinic with any problems, questions or concerns.    Tinnie KANDICE Dawn, NP 08/10/2024

## 2024-08-10 NOTE — Progress Notes (Signed)
 No concerns today

## 2024-08-12 ENCOUNTER — Other Ambulatory Visit: Payer: Self-pay

## 2024-08-20 NOTE — Progress Notes (Signed)
 Eric [name]                                          MRN: [id number]   08/20/2024   The [facility] Quality Team Specialist reviewed this patient medical record for the purposes of chart review for care gap closure. The following were reviewed: chart review for care gap closure-colorectal cancer screening.    [facility] Quality Team

## 2024-08-23 NOTE — Progress Notes (Signed)
 Discharge Plan Member has been discharged from [facility]. Please see below Discharge Plan for details. The Member has been provided a copy of their Discharge Plan via the [facility] Care portal. If you have any questions, please contact our Clinical Team at [phone].  Name: Eric [name] Date of Birth: 01-Dec-1950 Discharge Date: 2024-08-23 Discharged Reason: Not Engaged Referring Provider:Lauren Dasie Psychiatric Medication Recommendation Transition: N/A (no CC med recs made) Referrals: No referrals Discharge Summary: The Member canceled his initial behavioral health evaluation, and we were unable to connect with him to reschedule. [place] Care Provider: Kedric Severin - Health Coach

## 2024-08-31 ENCOUNTER — Telehealth: Payer: Self-pay

## 2024-08-31 NOTE — Telephone Encounter (Signed)
 Patient on list to make appt at [facility] but patient states that he sees someone else for PCP.

## 2024-10-18 ENCOUNTER — Encounter: Payer: Self-pay | Admitting: Internal Medicine

## 2024-10-24 ENCOUNTER — Other Ambulatory Visit: Payer: Self-pay

## 2024-10-24 DIAGNOSIS — C8330 Diffuse large B-cell lymphoma, unspecified site: Secondary | ICD-10-CM

## 2024-10-29 ENCOUNTER — Inpatient Hospital Stay: Attending: Internal Medicine

## 2024-10-29 ENCOUNTER — Ambulatory Visit
Admission: RE | Admit: 2024-10-29 | Discharge: 2024-10-29 | Disposition: A | Discharge status: Home / Self Care | Source: Ambulatory Visit | Attending: Nurse Practitioner | Admitting: Nurse Practitioner

## 2024-10-29 ENCOUNTER — Other Ambulatory Visit

## 2024-10-29 DIAGNOSIS — I10 Essential (primary) hypertension: Secondary | ICD-10-CM | POA: Insufficient documentation

## 2024-10-29 DIAGNOSIS — C8330 Diffuse large B-cell lymphoma, unspecified site: Secondary | ICD-10-CM | POA: Diagnosis present

## 2024-10-29 DIAGNOSIS — N200 Calculus of kidney: Secondary | ICD-10-CM | POA: Diagnosis not present

## 2024-10-29 DIAGNOSIS — C833A Diffuse large b-cell lymphoma, in remission: Secondary | ICD-10-CM | POA: Insufficient documentation

## 2024-10-29 DIAGNOSIS — I700 Atherosclerosis of aorta: Secondary | ICD-10-CM | POA: Diagnosis not present

## 2024-10-29 DIAGNOSIS — I2510 Atherosclerotic heart disease of native coronary artery without angina pectoris: Secondary | ICD-10-CM | POA: Insufficient documentation

## 2024-10-29 LAB — CBC WITH DIFFERENTIAL (CANCER CENTER ONLY)
Abs Immature Granulocytes: 0.11 K/uL — ABNORMAL HIGH (ref 0.00–0.07)
Basophils Absolute: 0 K/uL (ref 0.0–0.1)
Basophils Relative: 0 %
Eosinophils Absolute: 0.2 K/uL (ref 0.0–0.5)
Eosinophils Relative: 3 %
HCT: 43.8 % (ref 39.0–52.0)
Hemoglobin: 15.3 g/dL (ref 13.0–17.0)
Immature Granulocytes: 2 %
Lymphocytes Relative: 10 %
Lymphs Abs: 0.6 K/uL — ABNORMAL LOW (ref 0.7–4.0)
MCH: 32.9 pg (ref 26.0–34.0)
MCHC: 34.9 g/dL (ref 30.0–36.0)
MCV: 94.2 fL (ref 80.0–100.0)
Monocytes Absolute: 0.5 K/uL (ref 0.1–1.0)
Monocytes Relative: 8 %
Neutro Abs: 4.8 K/uL (ref 1.7–7.7)
Neutrophils Relative %: 77 %
Platelet Count: 128 K/uL — ABNORMAL LOW (ref 150–400)
RBC: 4.65 MIL/uL (ref 4.22–5.81)
RDW: 15.2 % (ref 11.5–15.5)
WBC Count: 6.2 K/uL (ref 4.0–10.5)
nRBC: 0 % (ref 0.0–0.2)

## 2024-10-29 LAB — CMP (CANCER CENTER ONLY)
ALT: 18 U/L (ref 0–44)
AST: 23 U/L (ref 15–41)
Albumin: 4.4 g/dL (ref 3.5–5.0)
Alkaline Phosphatase: 103 U/L (ref 38–126)
Anion gap: 10 (ref 5–15)
BUN: 17 mg/dL (ref 8–23)
CO2: 27 mmol/L (ref 22–32)
Calcium: 9.2 mg/dL (ref 8.9–10.3)
Chloride: 104 mmol/L (ref 98–111)
Creatinine: 0.87 mg/dL (ref 0.61–1.24)
GFR, Estimated: >60 mL/min
Glucose, Bld: 85 mg/dL (ref 70–99)
Potassium: 4.1 mmol/L (ref 3.5–5.1)
Sodium: 140 mmol/L (ref 135–145)
Total Bilirubin: 0.6 mg/dL (ref 0.0–1.2)
Total Protein: 5.9 g/dL — ABNORMAL LOW (ref 6.5–8.1)

## 2024-10-29 LAB — URIC ACID: Uric Acid, Serum: 5.9 mg/dL (ref 3.7–8.6)

## 2024-10-29 LAB — LACTATE DEHYDROGENASE: LDH: 300 U/L — ABNORMAL HIGH (ref 105–235)

## 2024-10-29 LAB — GLUCOSE, CAPILLARY: Glucose-Capillary: 87 mg/dL (ref 70–99)

## 2024-10-29 MED ORDER — FLUDEOXYGLUCOSE F - 18 (FDG) INJECTION
12.6100 | Freq: Once | INTRAVENOUS | Status: AC | PRN
  Administered 2024-10-29: 12.61 via INTRAVENOUS

## 2024-10-31 LAB — MISCELLANEOUS TEST

## 2024-11-12 ENCOUNTER — Encounter: Payer: Self-pay | Admitting: Internal Medicine

## 2024-11-16 ENCOUNTER — Other Ambulatory Visit: Payer: Self-pay | Admitting: Internal Medicine

## 2024-11-19 ENCOUNTER — Encounter: Payer: Self-pay | Admitting: Internal Medicine

## 2024-11-19 ENCOUNTER — Inpatient Hospital Stay: Attending: Internal Medicine | Admitting: Internal Medicine

## 2024-11-19 VITALS — BP 133/64 | HR 95 | Temp 99.0°F | Resp 16 | Ht 72.0 in | Wt 263.1 lb

## 2024-11-19 DIAGNOSIS — Z79624 Long term (current) use of inhibitors of nucleotide synthesis: Secondary | ICD-10-CM | POA: Diagnosis not present

## 2024-11-19 DIAGNOSIS — Z86711 Personal history of pulmonary embolism: Secondary | ICD-10-CM | POA: Insufficient documentation

## 2024-11-19 DIAGNOSIS — C833A Diffuse large b-cell lymphoma, in remission: Secondary | ICD-10-CM | POA: Insufficient documentation

## 2024-11-19 DIAGNOSIS — C8590 Non-Hodgkin lymphoma, unspecified, unspecified site: Secondary | ICD-10-CM | POA: Diagnosis not present

## 2024-11-19 DIAGNOSIS — D589 Hereditary hemolytic anemia, unspecified: Secondary | ICD-10-CM | POA: Insufficient documentation

## 2024-11-19 DIAGNOSIS — F17210 Nicotine dependence, cigarettes, uncomplicated: Secondary | ICD-10-CM | POA: Diagnosis not present

## 2024-11-19 MED ORDER — AMOXICILLIN 500 MG PO CAPS: 500.0000 mg | ORAL_CAPSULE | Freq: Three times a day (TID) | ORAL | 0 refills | Status: AC

## 2024-11-19 NOTE — Progress Notes (Signed)
 Has been dealing with possible sinus infection, cough past few weeks.   PET 10/29/24.  Pt states neuropathy in hands has gotten better but still doesn't have much of a grip.

## 2024-11-19 NOTE — Progress Notes (Signed)
 Eric [name]  Patient Care Team: Eric Nancyann BRAVO, MD as PCP - General (Family Medicine) [name], Eric ORN, MD (General Surgery) Eric Cindy SAUNDERS, MD as Consulting Physician (Internal Medicine)  CHIEF COMPLAINTS/PURPOSE OF CONSULTATION: Diffuse large B-cell lymphoma  #  Oncology History Overview [name]  #June 29, 2019-CT- bulky bilateral neck adenopathy left more than right; September 2020-Diffuse B-cell lymphoma; ABC subtype; NEGATIVE FISH studies.  PET scan -bulky adenopathy involving neck /lymphadenopathy mediastinum /abdomen/pelvis inguinal lymph nodes; splenic involvement.  Question bone.  Stage IV [spleen involvement; no bone marrow biopsy].  BCL-2: Positive /BCL 6: Positive /MUM-1: Positive /CD10: Negative 'Ki-67: High proliferation index (90%) /CD30: Negative /Cyclin D1: Negative C-MYC: Equivocal (20% staining seen on limited and fragmented tissue) /EBER: Negative   # Ritux-Pred- 9/11; 9/18- R-COP [held adria- sec to EF 45-50%]  #May 2021-PET scan left neck recurrent disease; excisional biopsy [Dr. Vaught]-recurrent diffuse left B-cell lymphoma  #November 2020-bilateral PE; on xarelto   #J07/07/2020- 2021- Gem-CarboDex [II opinion [facility]; Dr. Claudean; stem cell transplant eval]; no Rituxan -repeat CD20 negative.   #S/p 2 cycles-gem carbo Dex-June 18, 2020 PET scan-slight increase in SUV uptake of the left cervical lymph nodes; no distant metastatic disease.  Discontinued chemotherapy; refer to CAR-T;  OCT 12th 2021- [facility]- -New extensive cervical, supraclavicular, intrathoracic, axillary and abdominal and pelvic lymph nodes, worrisome for lymphoma involvement. New splenomegaly with new, diffuse uptake, now more intense than the liver, concerning for lymphoma involvement. S/p POLA-BENDA  # CAR-T/Axi-cel on 09/16/20 preceded by fludarabine /cyclophosphamide  lymphodepletion.[[facility]; Dr.Grover]; complicated by CRS-G-3 [ICU]; dec 27th-PET CR.  # JAN 2025- CT/PET  scan-widespread lymphadenopathy; splenic enlargement concerning for recurrent lymphoma  # JAN 16th, 2025- LN Bx-1. Lymph node, biopsy, Right supraclavicular :      -  FINDINGS CONSISTENT WITH RECURRENT/PERSISTENT DIFFUSE LARGE B-CELL LYMPHOMA.      [name]: THE THIN NEEDLE CORE BIOPSIES CONSISTS OF SHEETS OF MODERATE TO LARGE      SIZED CD20 POSITIVE B-CELL THAT COEXPRESS MUM1, BCL-2 AND BCL6 AND ARE NEGATIVE      FOR CD10 CONSISTENT WITH A POST GERMINAL [name]      CD3/CD5 HIGHLIGHTS SMALL BACKGROUND T CELLS.  CD23 IS NEGATIVE FOR A FOLLICULAR      DENDRITIC CELL MESHWORK.  THE PROLIFERATION RATE IS HIGH AT APPROXIMATELY 75% BY      KI-67.  FLOW CYTOMETRY IDENTIFIES STRONGLY KAPPA RESTRICTED B CELLS WITH A      NONSPECIFIC IMMUNOPHENOTYPE (CD5/CD10 NEGATIVE) WITH APPARENT LARGE SIZE BY      FORWARD SCATTER   # JAN 28th, 2025- Pola-Rituxan  [bridging]   # Sep 2020- Borderline low EF/Left ventricular hypokinesis [Dr.Gollan]-s October 9th stress test-nonischemic;   # DIAGNOSIS: DLBCL     Lymphoma of lymph nodes (HCC)  07/03/2019 Initial Diagnosis   Lymphoma of lymph nodes (HCC)   07/13/2019 - 12/07/2019 Chemotherapy   The patient had palonosetron  (ALOXI ) injection 0.25 mg, 0.25 mg, Intravenous,  Once, 7 of 7 cycles Administration: 0.25 mg (07/20/2019), 0.25 mg (08/13/2019), 0.25 mg (11/13/2019), 0.25 mg (09/03/2019), 0.25 mg (10/02/2019), 0.25 mg (10/23/2019), 0.25 mg (12/04/2019) pegfilgrastim  (NEULASTA  ONPRO KIT) injection 6 mg, 6 mg, Subcutaneous, Once, 2 of 2 cycles Administration: 6 mg (10/04/2019), 6 mg (10/25/2019) pegfilgrastim -cbqv (UDENYCA ) injection 6 mg, 6 mg, Subcutaneous, Once, 5 of 5 cycles Administration: 6 mg (07/23/2019), 6 mg (08/16/2019), 6 mg (09/06/2019), 6 mg (11/16/2019), 6 mg (12/07/2019) vinCRIStine  (ONCOVIN ) 2 mg in sodium chloride  0.9 % 50 mL chemo infusion, 2 mg, Intravenous,  Once,  7 of 7 cycles Administration: 2 mg (07/20/2019), 2 mg (08/13/2019), 2  mg (11/13/2019), 2 mg (09/03/2019), 2 mg (10/02/2019), 2 mg (10/23/2019), 2 mg (12/04/2019) cyclophosphamide  (CYTOXAN ) 1,900 mg in sodium chloride  0.9 % 250 mL chemo infusion, 750 mg/m2 = 1,900 mg, Intravenous,  Once, 7 of 7 cycles Administration: 1,900 mg (07/20/2019), 1,900 mg (08/13/2019), 2,000 mg (11/13/2019), 1,900 mg (09/03/2019), 2,000 mg (10/02/2019), 2,000 mg (10/23/2019), 2,000 mg (12/04/2019) etoposide  (VEPESID ) 250 mg in sodium chloride  0.9 % 1,000 mL chemo infusion, 100 mg/m2 = 250 mg, Intravenous,  Once, 6 of 6 cycles Dose modification: 50 mg/m2 (original dose 100 mg/m2, Cycle 3, Reason: Provider Judgment) Administration: 130 mg (08/14/2019), 130 mg (08/15/2019), 130 mg (09/03/2019), 130 mg (09/04/2019), 130 mg (09/05/2019), 130 mg (10/03/2019), 130 mg (10/04/2019), 130 mg (10/24/2019), 130 mg (10/25/2019), 130 mg (11/13/2019), 130 mg (11/14/2019), 130 mg (11/15/2019), 130 mg (10/02/2019), 130 mg (10/23/2019), 130 mg (12/04/2019), 130 mg (12/05/2019), 130 mg (12/06/2019) dexamethasone  (DECADRON ) 12 mg in sodium chloride  0.9 % 145 mL IVPB, , Intravenous,  Once, 8 of 8 cycles Administration:  (07/13/2019),  (07/20/2019),  (08/13/2019),  (11/13/2019),  (09/03/2019),  (10/02/2019),  (10/23/2019),  (12/04/2019) riTUXimab -pvvr (RUXIENCE ) 900 mg in sodium chloride  0.9 % 250 mL (2.6471 mg/mL) infusion, 375 mg/m2 = 900 mg (100 % of original dose 375 mg/m2), Intravenous,  Once, 1 of 1 cycle Dose modification: 375 mg/m2 (original dose 375 mg/m2, Cycle 1) Administration: 900 mg (07/13/2019)  for chemotherapy treatment.    05/09/2020 - 06/06/2020 Chemotherapy   The patient had palonosetron  (ALOXI ) injection 0.25 mg, 0.25 mg, Intravenous,  Once, 2 of 4 cycles Administration: 0.25 mg (05/09/2020), 0.25 mg (05/30/2020) CARBOplatin  (PARAPLATIN ) 750 mg in sodium chloride  0.9 % 250 mL chemo infusion, 750 mg (100 % of original dose 750 mg), Intravenous,  Once, 2 of 4 cycles Dose modification:   (original dose 750 mg, Cycle 1, Reason: Provider  Judgment, Comment: labs from care every where. ) Administration: 750 mg (05/09/2020), 750 mg (05/30/2020) gemcitabine  (GEMZAR ) 2,500 mg in sodium chloride  0.9 % 250 mL chemo infusion, 2,584 mg, Intravenous,  Once, 2 of 4 cycles Administration: 2,500 mg (05/09/2020), 2,500 mg (05/16/2020), 2,500 mg (05/30/2020), 2,500 mg (06/06/2020) fosaprepitant  (EMEND ) 150 mg in sodium chloride  0.9 % 145 mL IVPB, 150 mg, Intravenous,  Once, 2 of 4 cycles Administration: 150 mg (05/09/2020), 150 mg (05/30/2020)  for chemotherapy treatment.    08/27/2020 - 08/27/2020 Chemotherapy   Patient is on Treatment Plan : NON-HODGKINS LYMPHOMA RELAPSED/ REFRACTORY DLBCL Polatuzumab + Bendamustine  + Rituximab  q21d x 6 cycles     11/13/2020 Cancer Staging   Staging form: Hodgkin and Non-Hodgkin Lymphoma, AJCC 8th Edition - Clinical: Stage IV (Diffuse large B-cell lymphoma) - Signed by Lorrane Mccay R, MD on 11/13/2020   11/29/2023 -  Chemotherapy   Patient is on Treatment Plan : NON-HODGKINS LYMPHOMA RELAPSED/ REFRACTORY DLBCL Polatuzumab D1 ++ Rituximab  D1 q21d x 6 cycles       HISTORY OF PRESENTING ILLNESS: Ambulating independently.  Alone.   Eric [name] 74 y.o.  male relapsed/refractory diffuse large B-cell lymphoma [s/p CART therapy at [facility] in November 2021]- s/p  POLA-Ritx x6 cycles  is here for a follow up/and review results of the PET scan.  Discussed the use of AI scribe software for clinical [name] transcription with the patient, who gave verbal consent to proceed.  History of Present Illness   Eric [name] is a 74 year old male with relapsed Non-Hodgkin lymphoma who presents for routine surveillance and  evaluation of acute sinusitis and chemotherapy-induced peripheral neuropathy.  He remains on surveillance for relapsed Non-Hodgkin lymphoma following completion of chemotherapy in June of last year, having declined consolidation allogeneic stem cell transplant. Surveillance includes PET scan and ClonoSeq ctDNA testing.  The most recent PET scan demonstrated no evidence of active lymphoma, with incidental findings of small, non-obstructing nephrolithiasis and age-appropriate vascular calcifications. ClonoSeq ctDNA testing was negative. He has no new or enlarging lymphadenopathy, and denies B symptoms including fever, night sweats, or weight loss.  Over the past ten days, he has developed symptoms consistent with acute sinusitis, including persistent nasal congestion and stuffiness. He has not taken antibiotics or other medications for this episode and notes gradual improvement, though congestion persists. He denies facial pain or fever. He has a history of chronic sinus disease and left maxillary mucus-retaining cyst noted on prior imaging, but no recent evidence of infection or inflammation until this episode.  He continues to experience chemotherapy-induced peripheral neuropathy, previously characterized by significant tingling in the right extremity, particularly during late summer. Currently, tingling has nearly resolved, but he continues to have decreased grip strength in the right hand, which impairs his ability to use nail clippers. No new neurological symptoms are present, and overall, neuropathy symptoms have improved since completion of chemotherapy.  He is experiencing significant emotional and social stress due to his wife's advanced Parkinson's disease and dementia, requiring hospice care in assisted living, which has had a major impact on his well-being.       Review of Systems  Constitutional:  Negative for chills, diaphoresis and fever.  HENT:  Positive for hearing loss. Negative for nosebleeds and sore throat.   Eyes:  Negative for double vision.  Respiratory:  Negative for hemoptysis, sputum production and wheezing.   Cardiovascular:  Positive for leg swelling. Negative for chest pain, palpitations and orthopnea.  Gastrointestinal:  Negative for abdominal pain, blood in stool, constipation,  diarrhea, heartburn, melena, nausea and vomiting.  Genitourinary:  Negative for dysuria, frequency and urgency.  Musculoskeletal:  Negative for back pain and joint pain.  Skin: Negative.  Negative for itching and rash.  Neurological:  Negative for dizziness, tingling, focal weakness, weakness and headaches.  Endo/Heme/Allergies:  Does not bruise/bleed easily.  Psychiatric/Behavioral:  Negative for depression. The patient does not have insomnia.      MEDICAL HISTORY:  Past Medical History:  Diagnosis Date   Adopted person    Cancer [facility])    Colon polyps    Pancreatitis    Rectal bleeding 02/24/2014   Sepsis (HCC) 09/18/2019    SURGICAL HISTORY: Past Surgical History:  Procedure Laterality Date   CHOLECYSTECTOMY     COLONOSCOPY  02/28/14   IR IMAGING GUIDED PORT INSERTION  07/18/2019   IR REMOVAL TUN ACCESS W/ PORT W/O FL MOD SED  05/29/2021   LYMPH NODE BIOPSY Left 03/28/2020   Procedure: EXCISION OF SUBMANDIBULAR  LYMPH NODE;  Surgeon: Milissa Hamming, MD;  Location: [facility];  Service: ENT;  Laterality: Left;   POLYPECTOMY     colon polyp removed   TONSILLECTOMY      SOCIAL HISTORY: Social History   Socioeconomic History   Marital status: Married    Spouse name: Not on file   Number of children: Not on file   Years of education: Not on file   Highest education level: 12th grade  Occupational History    Comment: Retired from [employer]  Tobacco Use   Smoking status: Every Day    Types:  Cigars   Smokeless tobacco: Never   Tobacco comments:    smoke 1/2 ppd cigaretes since 74 yo. changed to cigars at age July 2004.   Vaping Use   Vaping status: Never Used  Substance and Sexual Activity   Alcohol use: Yes    Comment: 6-7 drinks weekly   Drug use: No   Sexual activity: Not on file  Other Topics Concern   Not on file  Social History Narrative   Lives at home with wife; in [place]. retd from [employer]. Cigar smoker; drinks liquor. Children - grown up.     Social Drivers of Health   Tobacco Use: High Risk (11/19/2024)   Patient History    Smoking Tobacco Use: Every Day    Smokeless Tobacco Use: Never    Passive Exposure: Not on file  Financial Resource Strain: Low Risk (08/22/2023)   Overall Financial Resource Strain (CARDIA)    Difficulty of Paying Living Expenses: Not hard at all  Food Insecurity: No Food Insecurity (08/22/2023)   Hunger Vital Sign    Worried About Running Out of Food in the Last Year: Never true    Ran Out of Food in the Last Year: Never true  Transportation Needs: No Transportation Needs (08/22/2023)   PRAPARE - Administrator, Civil Service (Medical): No    Lack of Transportation (Non-Medical): No  Physical Activity: Insufficiently Active (08/22/2023)   Exercise Vital Sign    Days of Exercise per Week: 1 day    Minutes of Exercise per Session: 10 min  Stress: No Stress Concern Present (08/22/2023)   Eric [name]    Feeling of Stress : Only a little  Social Connections: Moderately Isolated (08/22/2023)   Social Connection and Isolation Panel    Frequency of Communication with Friends and Family: Once a week    Frequency of Social Gatherings with Friends and Family: Twice a week    Attends Religious Services: Never    Database Administrator or Organizations: No    Attends Engineer, Structural: Not on file    Marital Status: Married  Catering Manager Violence: Not on file  Depression (PHQ2-9): Low Risk (11/19/2024)   Depression (PHQ2-9)    PHQ-2 Score: 0  Alcohol Screen: Low Risk (08/22/2023)   Alcohol Screen    Last Alcohol Screening Score (AUDIT): 3  Housing: Low Risk (08/22/2023)   Housing    Last Housing Risk Score: 0  Utilities: Not on file  Health Literacy: Not on file    FAMILY HISTORY: Family History  Adopted: Yes    ALLERGIES:  is allergic to allopurinol , azithromycin , and losartan   potassium.  MEDICATIONS:  Current Outpatient Medications  Medication Sig Dispense Refill   amoxicillin  (AMOXIL ) 500 MG capsule Take 1 capsule (500 mg total) by mouth 3 (three) times daily. 21 capsule 0   Multiple Vitamin (MULTI-VITAMIN) tablet Take 1 tablet by mouth daily.     valACYclovir  (VALTREX ) 500 MG tablet TAKE ONE TABLET (500 MG) BY MOUTH TWICE DAILY (Patient taking differently: Take 500 mg by mouth daily.) 60 tablet 4   No current facility-administered medications for this visit.      SABRA  PHYSICAL EXAMINATION: ECOG PERFORMANCE STATUS: 1 - Symptomatic but completely ambulatory  Vitals:   11/19/24 0938  BP: 133/64  Pulse: 95  Resp: 16  Temp: 99 F (37.2 C)  SpO2: 100%      Filed Weights   11/19/24 0938  Weight:  263 lb 1.6 oz (119.3 kg)     Physical Exam HENT:     Head: Normocephalic and atraumatic.     Mouth/Throat:     Pharynx: No oropharyngeal exudate.  Eyes:     Pupils: Pupils are equal, round, and reactive to light.  Cardiovascular:     Rate and Rhythm: Normal rate and regular rhythm.  Pulmonary:     Effort: Pulmonary effort is normal. No respiratory distress.     Breath sounds: Normal breath sounds. No wheezing.  Abdominal:     General: Bowel sounds are normal. There is no distension.     Palpations: Abdomen is soft. There is no mass.     Tenderness: There is no abdominal tenderness. There is no guarding or rebound.  Musculoskeletal:        General: No tenderness. Normal range of motion.     Cervical back: Normal range of motion and neck supple.  Skin:    General: Skin is warm.  Neurological:     Mental Status: He is alert and oriented to person, place, and time.  Psychiatric:        Mood and Affect: Affect normal.    LABORATORY DATA:  I have reviewed the data as listed Lab Results  Component Value Date   WBC 6.2 10/29/2024   HGB 15.3 10/29/2024   HCT 43.8 10/29/2024   MCV 94.2 10/29/2024   PLT 128 (L) 10/29/2024   Recent Labs     05/11/24 0926 07/20/24 0919 10/29/24 0924  NA 140 137 140  K 4.1 4.0 4.1  CL 106 104 104  CO2 26 25 27   GLUCOSE 120* 115* 85  BUN 21 20 17   CREATININE 1.04 0.94 0.87  CALCIUM  9.1 8.9 9.2  GFRNONAA >60 >60 >60  PROT 6.2* 6.1* 5.9*  ALBUMIN 4.3 4.1 4.4  AST 24 27 23   ALT 26 20 18   ALKPHOS 73 78 103  BILITOT 1.1 1.2 0.6    RADIOGRAPHIC STUDIES: I have personally reviewed the radiological images as listed and agreed with the findings in the report. NM PET Image Restag (PS) Skull Base To Thigh Result Date: 11/07/2024 CLINICAL DATA:  Subsequent treatment strategy for large B-cell lymphoma. EXAM: NUCLEAR MEDICINE PET SKULL BASE TO THIGH TECHNIQUE: 12.6 mCi F-18 FDG was injected intravenously. Full-ring PET imaging was performed from the skull base to thigh after the radiotracer. CT data was obtained and used for attenuation correction and anatomic localization. Fasting blood glucose: 87 mg/dl COMPARISON:  93/69/7974. FINDINGS: Mediastinal blood pool activity: SUV max 2.6 Liver activity: SUV max 4.0 NECK: No hypermetabolic lymph nodes. Incidental CT findings: Retention cysts or polyps in the left maxillary sinus. CHEST: No abnormal hypermetabolism. Incidental CT findings: Atherosclerotic calcification of the aorta, aortic valve and coronary arteries. Enlarged pulmonic trunk and heart. No pulmonary nodules large enough for PET resolution. ABDOMEN/PELVIS: Proximal and mid gastric uptake, new from the prior exam. No additional abnormal hypermetabolism. Incidental CT findings: Cholecystectomy. Low-attenuation lesions in the kidneys. No specific follow-up necessary. Bilateral renal stones, left greater than right. Retroaortic left renal vein. Gastric wall does not appear thickened. Small bilateral inguinal hernias contain fat. SKELETON: No abnormal hypermetabolism. Incidental CT findings: Degenerative changes in the spine. IMPRESSION: 1. No evidence of recurrent lymphoma. 2. New proximal and mid gastric  wall thickening is nonspecific but can be seen in the setting of gastritis. Please correlate clinically. 3. Bilateral left renal stones. 4. Aortic atherosclerosis (ICD10-I70.0). Coronary artery calcification. 5. Enlarged pulmonic trunk, indicative of  pulmonary arterial hypertension. Electronically Signed   By: Newell Eke M.D.   On: 11/07/2024 12:06     ASSESSMENT & PLAN:   Lymphoma of lymph nodes (HCC) # Chemo refractory/ Relapsed diffuse large B-cell lymphoma-ABC subtype; IV [spleen]; NOV 2021- CART therapy. DEC 30th, 2024- RECURRENT/RELPASED.  # JAN 2025-neck lymph node biopsy recurrent diffuse B-cell lymphoma-ABC subtype.   Patient currently s/p bridging therapy  with-POLA+ rituximab  infusion.  Patient noted -NOT A CANDIDATE REPEAT CAR-T Clinical trial at [name].declined  allo-stem cell transplant. currently s/p Pola-Rituxan  # 6- LAST CHEMO- June 11th, 2025.  Hemolytic anemia -[sec to DLBCL -dec 2024]-  # JAN 2026- PET scan-complete response. Patient has declined moving forward with alloSCT due to his age, social/home factors-;and  DEC 2025-clonoseq- ct DNA negative- is reasonable to continue surveilleinace.. Agree with the plan-clonoseq every 3 months.  # Sinus infection- start Amoxicillin  500 mg TID x7 days; recommend claritin D prn. Will check IG levels at next visit.   # Borderline LOW EF of 45-50%; Sep Echo 50-55% [Dr.Gollan]- on coreg  BID  Dr. Gollan-JAN 29th, 2025- 2D echo- 60-65%-  stable.   # Cirrhosis/Persistent splenomegaly which may be secondary- based Imaging [No decompensation]-no prior history of hepatitis B or C. stable.   # High risk of tumor lysis/  elevated uric acid-10- [ Hx of rash to allopurinol ]-currently on febuxostat   80 mg a day.   # PIV- s/p port explanation- pt declined port at this time.   # ACP: full code.  #Incidental findings on Imaging  PET-CT , 2025: New proximal and mid gastric wall thickening is nonspecific but can be seen in the setting of  gastritis. Bilateral left renal stones [asymtomatic]; Aortic atherosclerosis;Coronary artery calcification; Enlarged pulmonic trunk, indicative of pulmonary arterial hypertension reviewed/discussed/counseled the patient.   PS # DISPOSITION:  # Follow up in 3 months - MD- 3 weeks PRIOR- labs- cbc/cmp; LDH; clonoseq; uric acid; quantitative immunoglobulins-  Dr.B  # I reviewed the blood work- with the patient in detail; also reviewed the imaging independently [as summarized above]; and with the patient in detail.     All questions were answered. The patient knows to call the clinic with any problems, questions or concerns.    Cindy JONELLE Joe, MD 11/19/2024 10:08 AM

## 2024-11-19 NOTE — Assessment & Plan Note (Addendum)
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 DEC 30th, 2024- RECURRENT/RELPASED.  # JAN 2025-neck lymph node biopsy recurrent diffuse B-cell lymphoma-ABC subtype.   Patient currently s/p bridging therapy  with-POLA+ rituximab  infusion.  Patient noted -NOT A CANDIDATE REPEAT CAR-T Clinical trial at [facility].declined  allo-stem cell transplant. currently s/p Pola-Rituxan  # 6- LAST CHEMO- June 11th, 2025.  Hemolytic anemia -[sec to DLBCL -dec 2024]-  # JAN 2026- PET scan-complete response. Patient has declined moving forward with alloSCT due to his age, social/home factors-;and  DEC 2025-clonoseq- ct DNA negative- is reasonable to continue surveilleinace.. Agree with the plan-clonoseq every 3 months.  # Sinus infection- start Amoxicillin  500 mg TID x7 days; recommend claritin D prn. Will check IG levels at next visit.   # Borderline LOW EF of 45-50%; Sep Echo 50-55% [Dr.Gollan]- on coreg  BID  Dr. Gollan-JAN 29th, 2025- 2D echo- 60-65%-  stable.   # Cirrhosis/Persistent splenomegaly which may be secondary- based Imaging [No decompensation]-no prior history of hepatitis B or C. stable.   # High risk of tumor lysis/  elevated uric acid-10- [ Hx of rash to allopurinol ]-currently on febuxostat   80 mg a day.   # PIV- s/p port explanation- pt declined port at this time.   # ACP: full code.  #Incidental findings on Imaging  PET-CT , 2025: New proximal and mid gastric wall thickening is nonspecific but can be seen in the setting of gastritis. Bilateral left renal stones [asymtomatic]; Aortic atherosclerosis;Coronary artery calcification; Enlarged pulmonic trunk, indicative of pulmonary arterial hypertension reviewed/discussed/counseled the patient.   PS # DISPOSITION:  # Follow up in 3 months - MD- 3 weeks PRIOR- labs- cbc/cmp; LDH; clonoseq; uric acid; quantitative immunoglobulins-  Dr.B  # I reviewed the blood work- with the patient in detail;  also reviewed the imaging independently [as summarized above]; and with the patient in detail.

## 2024-11-20 ENCOUNTER — Other Ambulatory Visit: Payer: Self-pay

## 2025-01-28 ENCOUNTER — Inpatient Hospital Stay

## 2025-02-18 ENCOUNTER — Inpatient Hospital Stay: Admitting: Internal Medicine
